# Patient Record
Sex: Female | Born: 1975
Health system: Southern US, Community
[De-identification: ages and names within clinical notes are randomized; demographics above are authoritative.]

## PROBLEM LIST (undated history)

## (undated) ENCOUNTER — Inpatient Hospital Stay (HOSPITAL_COMMUNITY): Payer: Self-pay

## (undated) DIAGNOSIS — D649 Anemia, unspecified: Secondary | ICD-10-CM

## (undated) DIAGNOSIS — IMO0001 Reserved for inherently not codable concepts without codable children: Secondary | ICD-10-CM

## (undated) DIAGNOSIS — I10 Essential (primary) hypertension: Secondary | ICD-10-CM

## (undated) DIAGNOSIS — J302 Other seasonal allergic rhinitis: Secondary | ICD-10-CM

## (undated) DIAGNOSIS — B009 Herpesviral infection, unspecified: Secondary | ICD-10-CM

## (undated) DIAGNOSIS — G709 Myoneural disorder, unspecified: Secondary | ICD-10-CM

## (undated) DIAGNOSIS — I7300 Raynaud's syndrome without gangrene: Secondary | ICD-10-CM

## (undated) DIAGNOSIS — E669 Obesity, unspecified: Secondary | ICD-10-CM

## (undated) DIAGNOSIS — F419 Anxiety disorder, unspecified: Secondary | ICD-10-CM

## (undated) DIAGNOSIS — K219 Gastro-esophageal reflux disease without esophagitis: Secondary | ICD-10-CM

## (undated) DIAGNOSIS — R51 Headache: Secondary | ICD-10-CM

## (undated) DIAGNOSIS — F32A Depression, unspecified: Secondary | ICD-10-CM

## (undated) DIAGNOSIS — O24419 Gestational diabetes mellitus in pregnancy, unspecified control: Secondary | ICD-10-CM

## (undated) DIAGNOSIS — R112 Nausea with vomiting, unspecified: Secondary | ICD-10-CM

## (undated) DIAGNOSIS — D509 Iron deficiency anemia, unspecified: Secondary | ICD-10-CM

## (undated) DIAGNOSIS — Z9889 Other specified postprocedural states: Secondary | ICD-10-CM

## (undated) HISTORY — DX: Obesity, unspecified: E66.9

## (undated) HISTORY — DX: Iron deficiency anemia, unspecified: D50.9

---

## 1997-01-22 HISTORY — PX: WISDOM TOOTH EXTRACTION: SHX21

## 2000-03-20 ENCOUNTER — Other Ambulatory Visit: Admission: RE | Admit: 2000-03-20 | Discharge: 2000-03-20 | Payer: Self-pay | Admitting: Obstetrics and Gynecology

## 2001-04-09 ENCOUNTER — Other Ambulatory Visit: Admission: RE | Admit: 2001-04-09 | Discharge: 2001-04-09 | Payer: Self-pay | Admitting: Obstetrics and Gynecology

## 2001-11-11 ENCOUNTER — Encounter: Payer: Self-pay | Admitting: Family Medicine

## 2001-11-11 ENCOUNTER — Encounter: Admission: RE | Admit: 2001-11-11 | Discharge: 2001-11-11 | Payer: Self-pay | Admitting: Family Medicine

## 2001-12-30 ENCOUNTER — Encounter: Admission: RE | Admit: 2001-12-30 | Discharge: 2001-12-30 | Payer: Self-pay | Admitting: Neurology

## 2001-12-30 ENCOUNTER — Encounter: Payer: Self-pay | Admitting: Neurology

## 2002-05-21 ENCOUNTER — Other Ambulatory Visit: Admission: RE | Admit: 2002-05-21 | Discharge: 2002-05-21 | Payer: Self-pay | Admitting: Obstetrics and Gynecology

## 2002-06-16 ENCOUNTER — Encounter: Payer: Self-pay | Admitting: Obstetrics and Gynecology

## 2002-06-16 ENCOUNTER — Encounter: Admission: RE | Admit: 2002-06-16 | Discharge: 2002-06-16 | Payer: Self-pay | Admitting: Obstetrics and Gynecology

## 2002-12-04 ENCOUNTER — Ambulatory Visit (HOSPITAL_COMMUNITY): Admission: RE | Admit: 2002-12-04 | Discharge: 2002-12-04 | Payer: Self-pay | Admitting: Family Medicine

## 2002-12-08 ENCOUNTER — Other Ambulatory Visit: Admission: RE | Admit: 2002-12-08 | Discharge: 2002-12-08 | Payer: Self-pay | Admitting: Obstetrics and Gynecology

## 2002-12-11 ENCOUNTER — Ambulatory Visit (HOSPITAL_COMMUNITY): Admission: RE | Admit: 2002-12-11 | Discharge: 2002-12-11 | Payer: Self-pay | Admitting: Family Medicine

## 2003-01-23 HISTORY — PX: LAPAROSCOPIC GASTRIC BANDING: SHX1100

## 2003-08-05 ENCOUNTER — Other Ambulatory Visit: Admission: RE | Admit: 2003-08-05 | Discharge: 2003-08-05 | Payer: Self-pay | Admitting: Obstetrics and Gynecology

## 2004-01-23 HISTORY — PX: OTHER SURGICAL HISTORY: SHX169

## 2004-10-10 ENCOUNTER — Other Ambulatory Visit: Admission: RE | Admit: 2004-10-10 | Discharge: 2004-10-10 | Payer: Self-pay | Admitting: Family Medicine

## 2004-10-10 ENCOUNTER — Ambulatory Visit: Payer: Self-pay | Admitting: Family Medicine

## 2004-10-10 LAB — CONVERTED CEMR LAB: Pap Smear: ABNORMAL

## 2004-10-23 ENCOUNTER — Ambulatory Visit: Payer: Self-pay | Admitting: Family Medicine

## 2005-01-01 ENCOUNTER — Ambulatory Visit: Payer: Self-pay | Admitting: Family Medicine

## 2005-01-22 DIAGNOSIS — B009 Herpesviral infection, unspecified: Secondary | ICD-10-CM

## 2005-01-22 HISTORY — DX: Herpesviral infection, unspecified: B00.9

## 2005-01-24 ENCOUNTER — Ambulatory Visit: Payer: Self-pay | Admitting: Family Medicine

## 2005-01-25 ENCOUNTER — Ambulatory Visit: Payer: Self-pay | Admitting: Family Medicine

## 2005-05-15 ENCOUNTER — Ambulatory Visit: Payer: Self-pay | Admitting: Family Medicine

## 2005-09-28 ENCOUNTER — Ambulatory Visit: Payer: Self-pay | Admitting: Family Medicine

## 2005-10-30 DIAGNOSIS — J309 Allergic rhinitis, unspecified: Secondary | ICD-10-CM | POA: Insufficient documentation

## 2005-10-30 DIAGNOSIS — N926 Irregular menstruation, unspecified: Secondary | ICD-10-CM | POA: Insufficient documentation

## 2005-10-30 DIAGNOSIS — I10 Essential (primary) hypertension: Secondary | ICD-10-CM | POA: Insufficient documentation

## 2005-11-07 ENCOUNTER — Ambulatory Visit: Payer: Self-pay | Admitting: Family Medicine

## 2005-12-03 ENCOUNTER — Encounter: Payer: Self-pay | Admitting: Family Medicine

## 2005-12-03 DIAGNOSIS — E669 Obesity, unspecified: Secondary | ICD-10-CM | POA: Insufficient documentation

## 2005-12-06 ENCOUNTER — Ambulatory Visit: Payer: Self-pay | Admitting: Family Medicine

## 2005-12-12 ENCOUNTER — Telehealth: Payer: Self-pay | Admitting: Family Medicine

## 2006-12-09 ENCOUNTER — Ambulatory Visit: Payer: Self-pay | Admitting: Family Medicine

## 2007-10-17 ENCOUNTER — Ambulatory Visit: Payer: Self-pay | Admitting: Occupational Medicine

## 2007-10-20 ENCOUNTER — Encounter: Payer: Self-pay | Admitting: Family Medicine

## 2007-10-24 ENCOUNTER — Ambulatory Visit: Payer: Self-pay | Admitting: Occupational Medicine

## 2007-10-24 LAB — CONVERTED CEMR LAB: Beta hcg, urine, semiquantitative: POSITIVE

## 2007-11-14 ENCOUNTER — Ambulatory Visit: Payer: Self-pay | Admitting: Family Medicine

## 2007-11-19 ENCOUNTER — Ambulatory Visit (HOSPITAL_COMMUNITY): Admission: RE | Admit: 2007-11-19 | Discharge: 2007-11-19 | Payer: Self-pay | Admitting: Obstetrics and Gynecology

## 2007-11-19 IMAGING — US US OB COMP LESS 14 WK
1 series · 14 of 28 positions shown · non-contrast
Comparison: none

OBSTETRICAL ULTRASOUND:
 This ultrasound exam was performed in the [HOSPITAL] Ultrasound Department.  The OB US report was generated in the AS system, and faxed to the ordering physician.  This report is also available in [REDACTED] PACS.

[Series 1: us ob comp less 14 wk · 0.26mm/px · 14 of 35 slices shown]
[im 2/35]
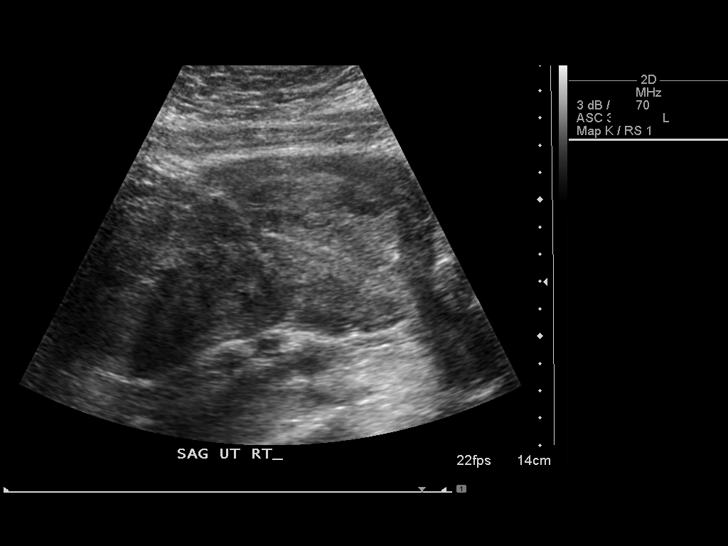
[im 4/35]
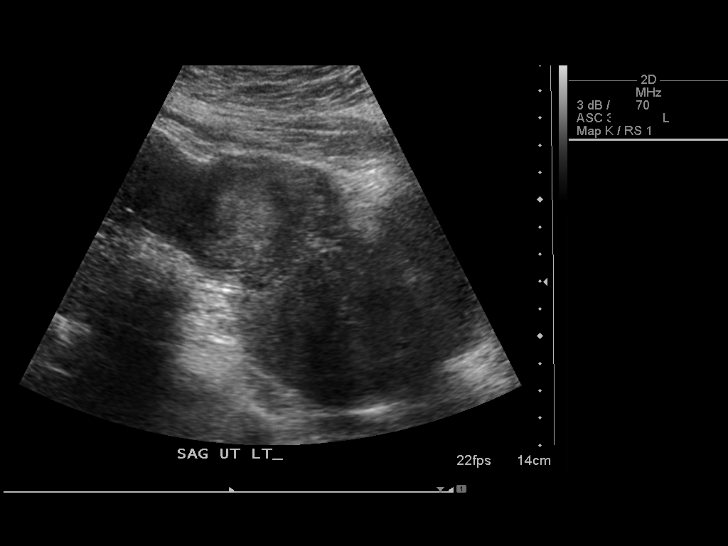
[im 7/35]
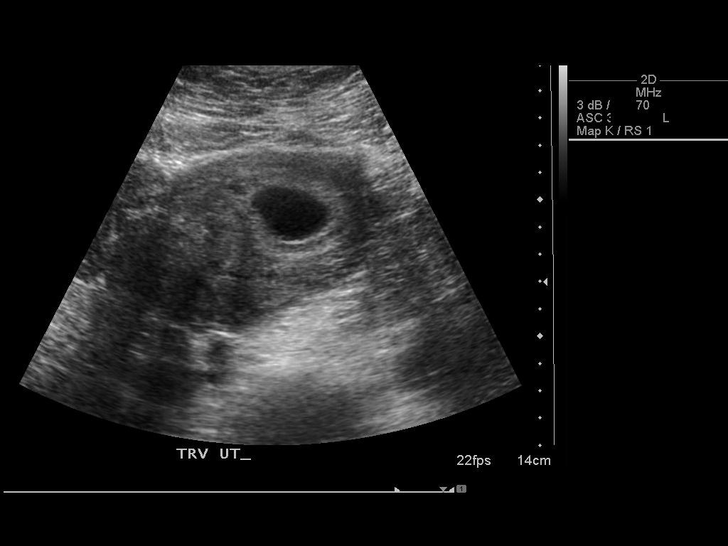
[im 9/35]
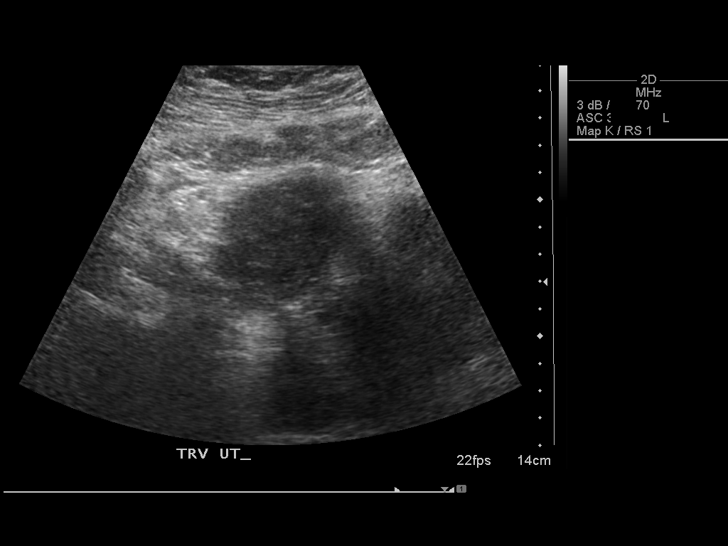
[im 12/35]
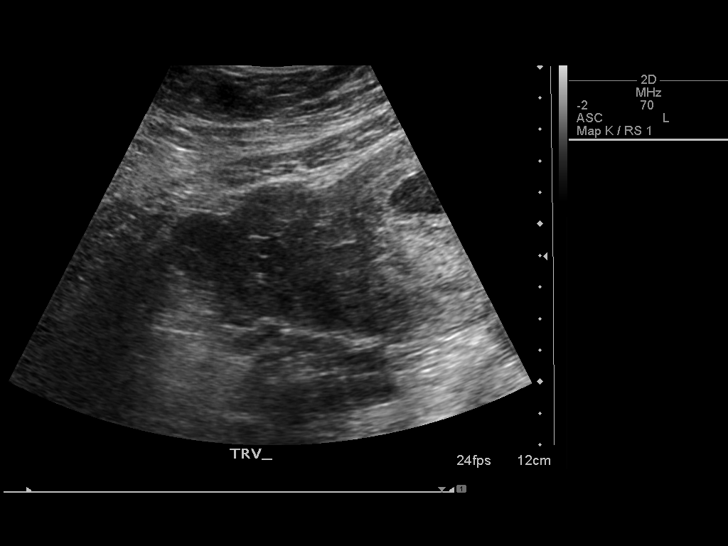
[im 14/35]
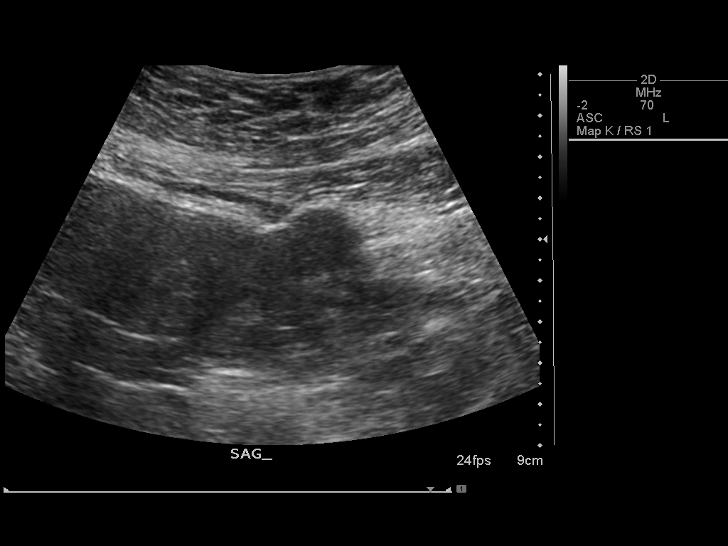
[im 17/35]
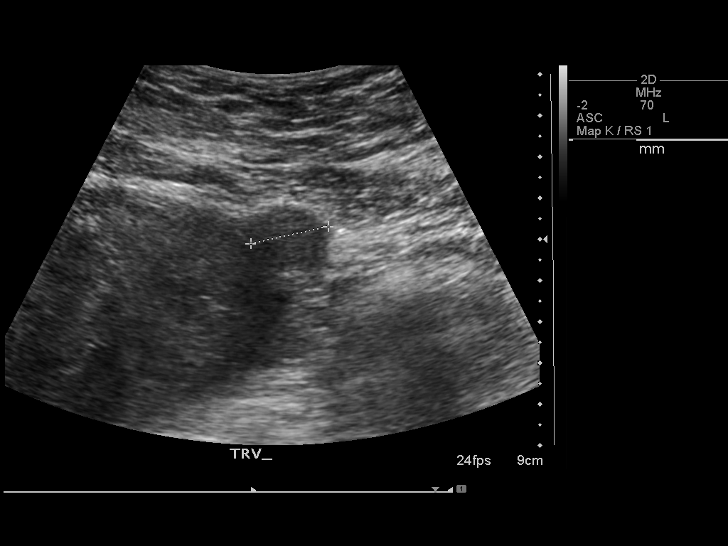
[im 19/35]
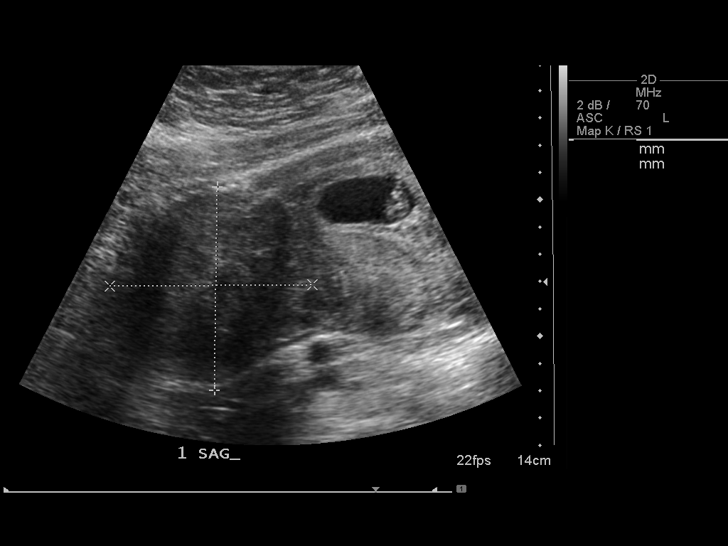
[im 22/35]
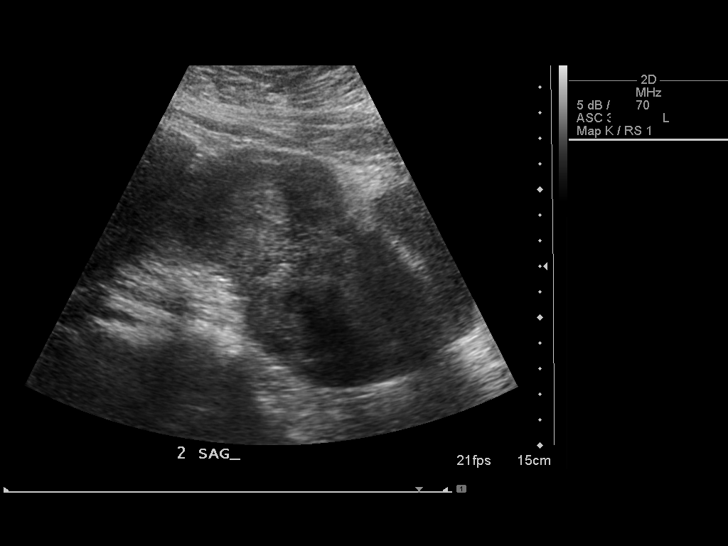
[im 24/35]
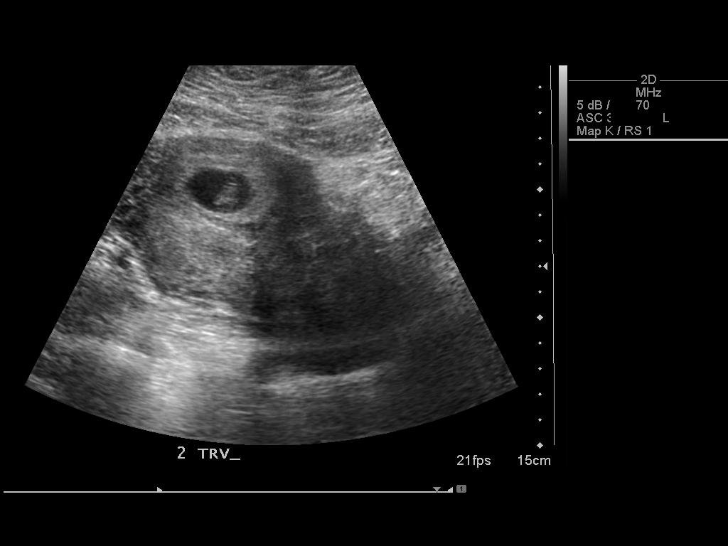
[im 27/35]
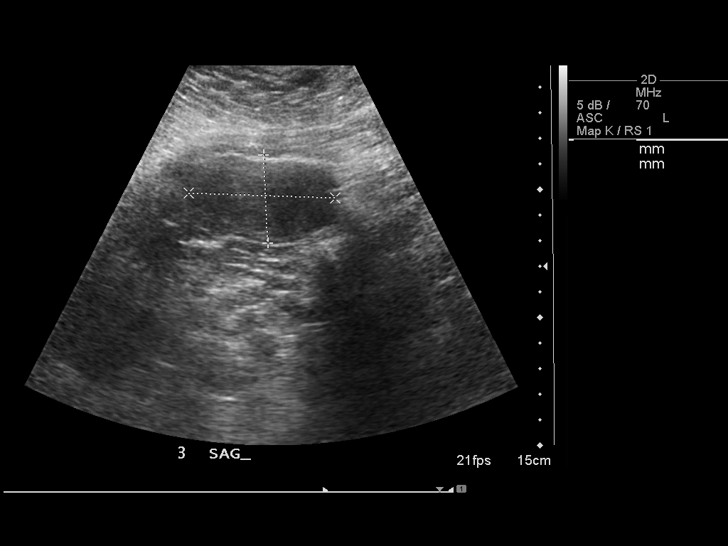
[im 29/35]
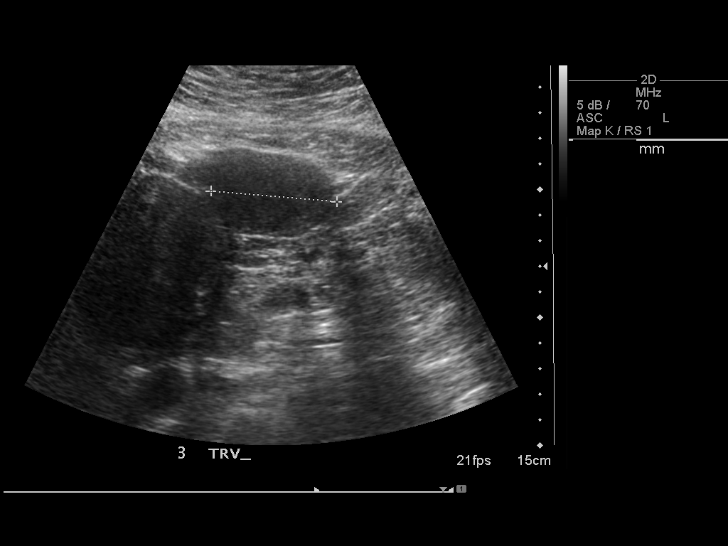
[im 32/35]
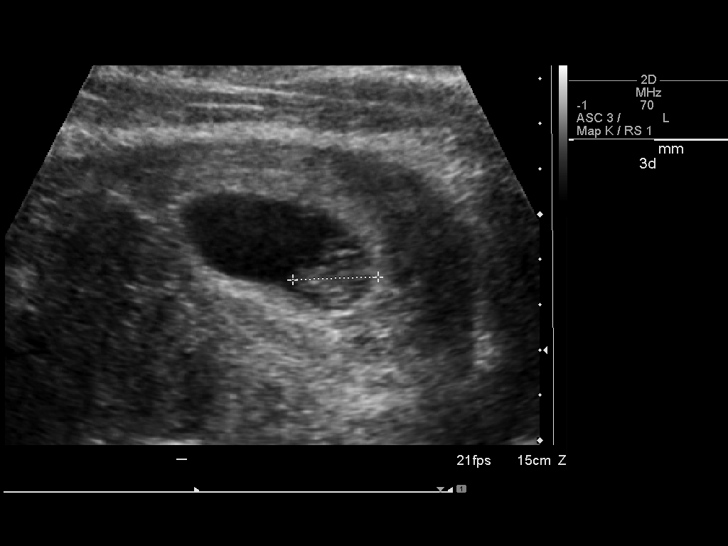
[im 35/35]
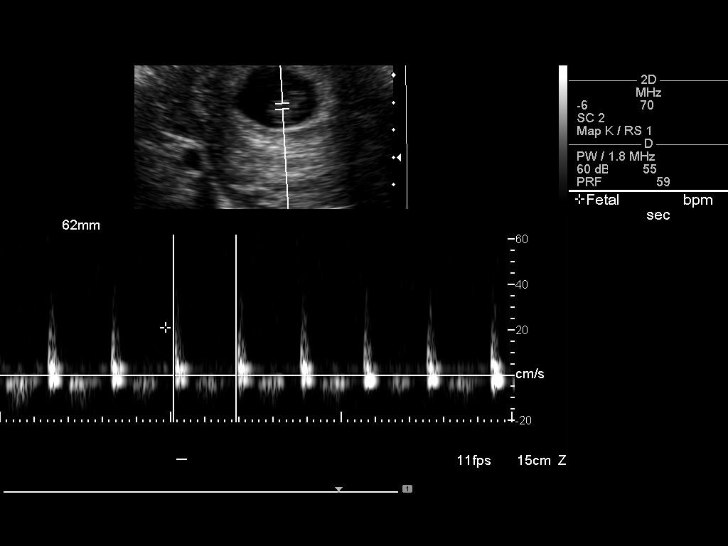

[14 of 28 positions shown; findings below may reference images not displayed]

IMPRESSION: See AS Obstetric US report.

## 2008-01-23 DIAGNOSIS — IMO0001 Reserved for inherently not codable concepts without codable children: Secondary | ICD-10-CM

## 2008-01-23 HISTORY — DX: Reserved for inherently not codable concepts without codable children: IMO0001

## 2008-03-11 ENCOUNTER — Ambulatory Visit (HOSPITAL_COMMUNITY): Admission: RE | Admit: 2008-03-11 | Discharge: 2008-03-11 | Payer: Self-pay | Admitting: Obstetrics and Gynecology

## 2008-03-11 IMAGING — US US FETAL BPP W/O NONSTRESS
1 of 2 series · 14 of 28 positions shown · non-contrast
Comparison: none

OBSTETRICAL ULTRASOUND:
 This ultrasound exam was performed in the [HOSPITAL] Ultrasound Department.  The OB US report was generated in the AS system, and faxed to the ordering physician.  This report is also available in [REDACTED] PACS.

[Series 1: us fetal bpp w/o ns modify · non-contrast · 14 of 87 slices shown]
[im 1/87]
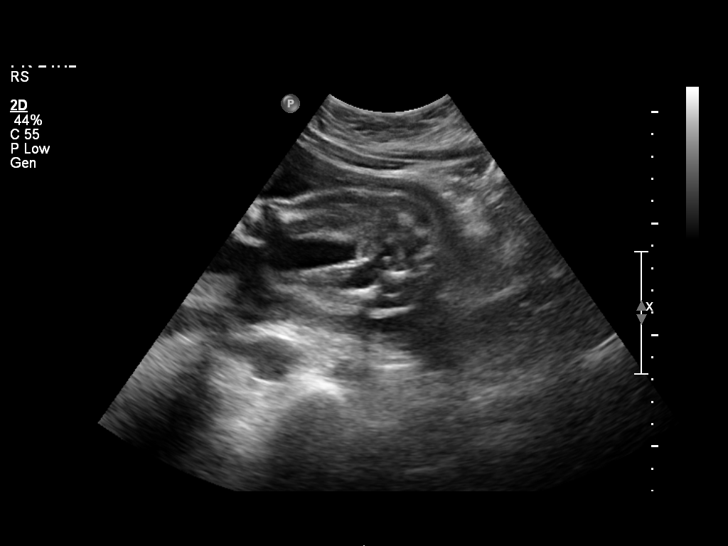
[im 7/87]
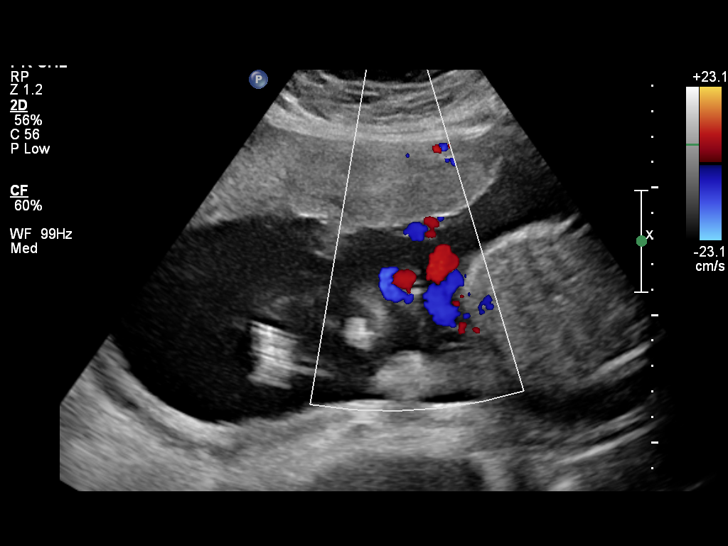
[im 14/87]
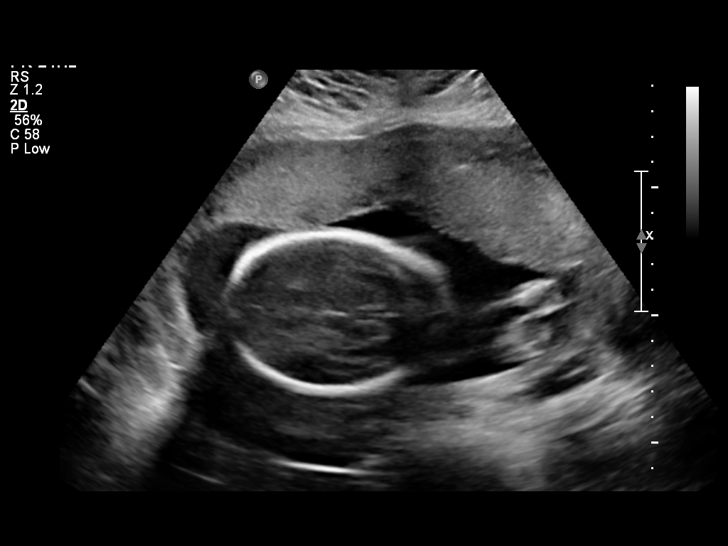
[im 20/87]
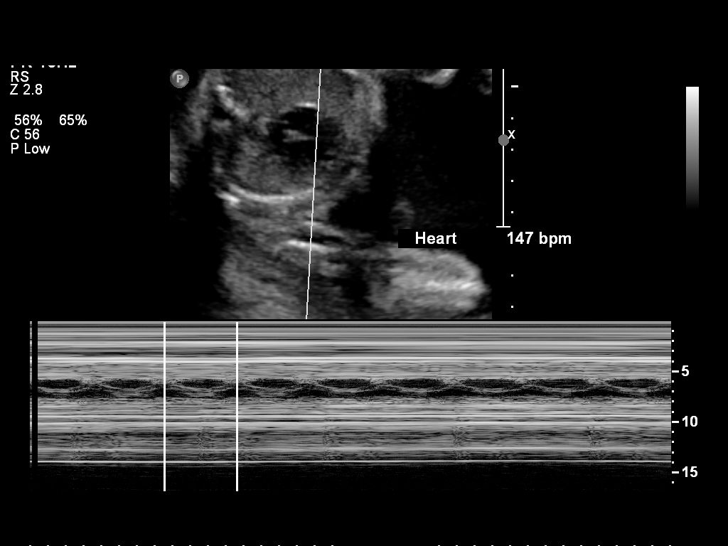
[im 27/87]
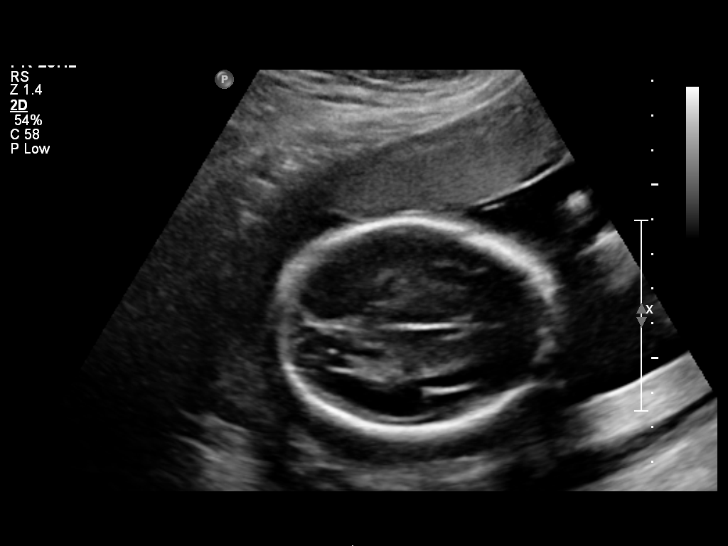
[im 34/87]
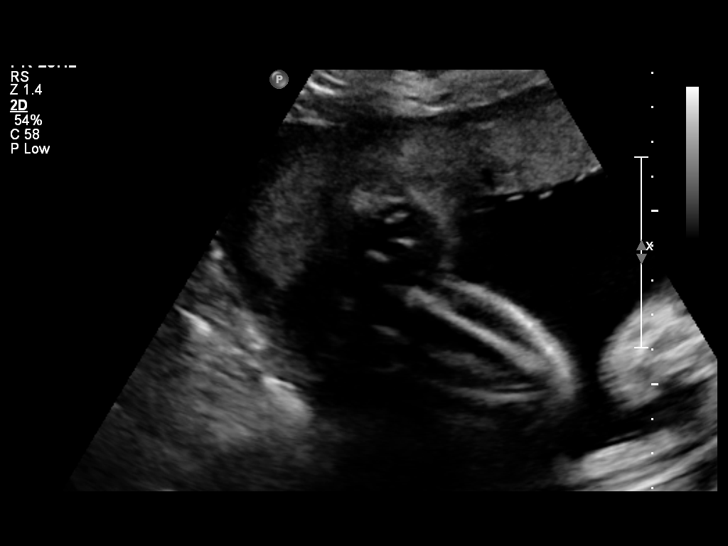
[im 40/87]
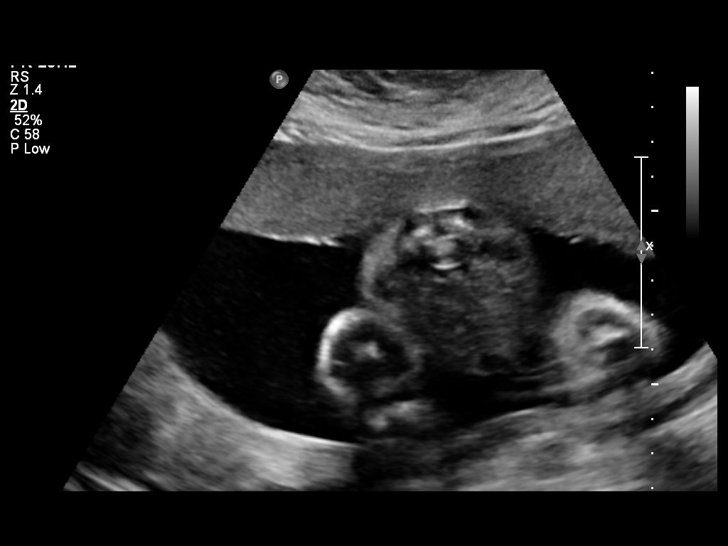
[im 47/87]
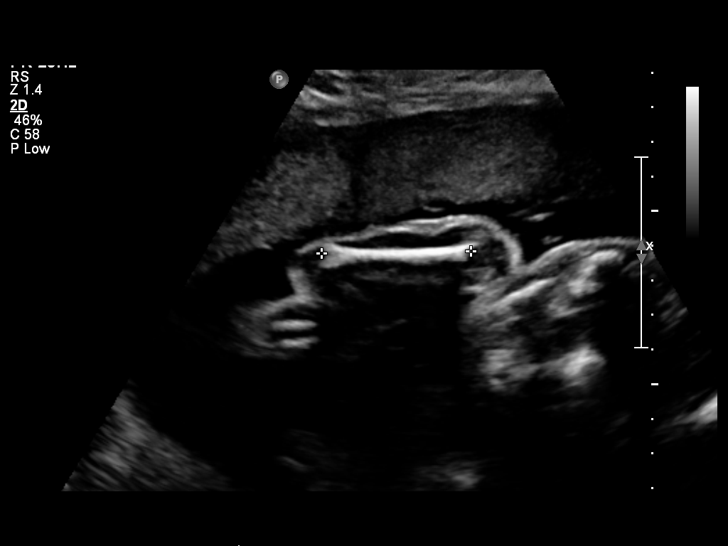
[im 53/87]
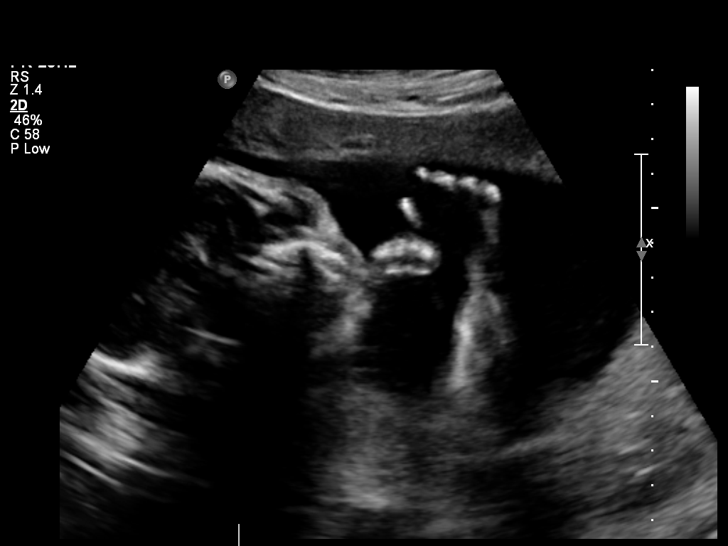
[im 60/87]
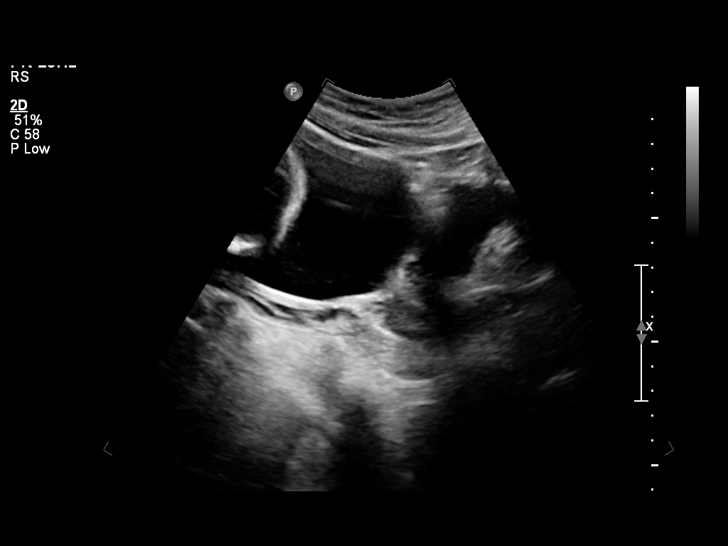
[im 67/87]
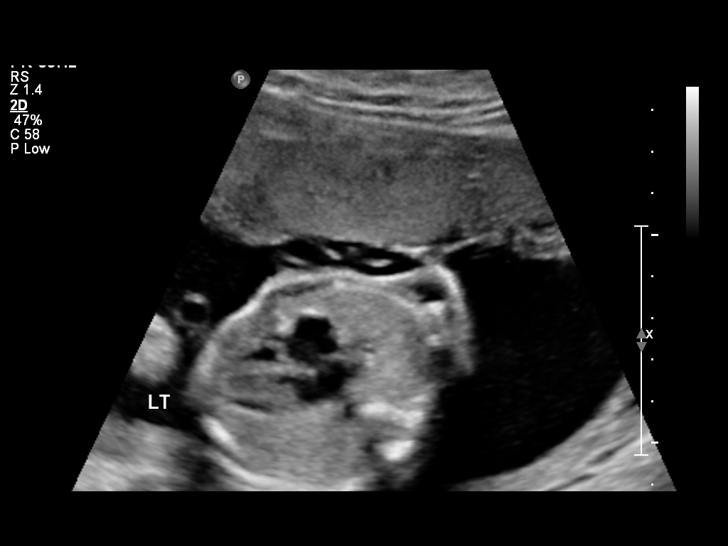
[im 73/87]
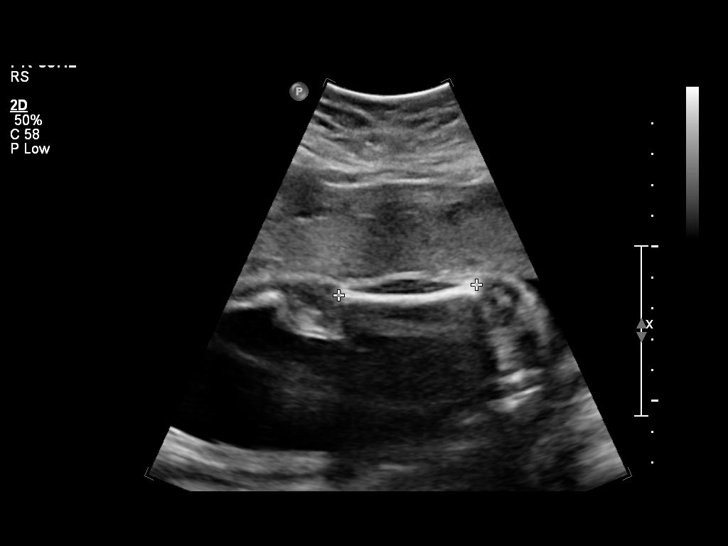
[im 80/87]
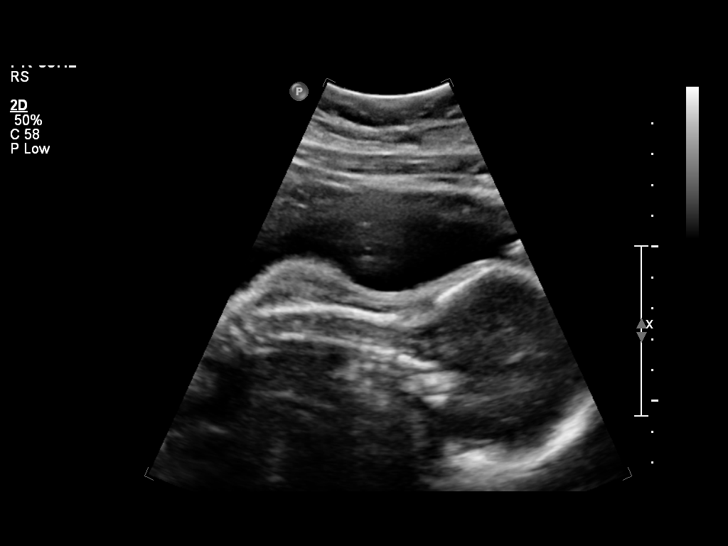
[im 87/87]
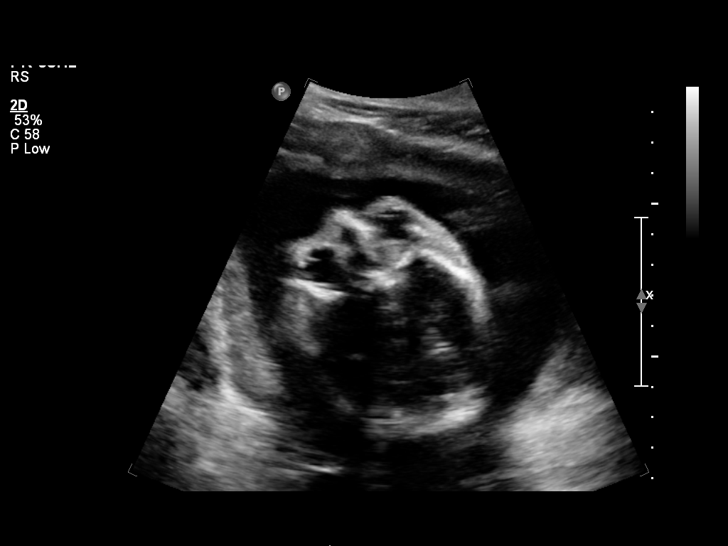

[14 of 28 positions shown; findings below may reference images not displayed]

IMPRESSION: See AS Obstetric US report.

## 2008-06-13 ENCOUNTER — Inpatient Hospital Stay (HOSPITAL_COMMUNITY): Admission: AD | Admit: 2008-06-13 | Discharge: 2008-06-14 | Payer: Self-pay | Admitting: Obstetrics and Gynecology

## 2008-06-15 ENCOUNTER — Inpatient Hospital Stay (HOSPITAL_COMMUNITY): Admission: AD | Admit: 2008-06-15 | Discharge: 2008-06-17 | Payer: Self-pay | Admitting: Obstetrics and Gynecology

## 2008-06-22 ENCOUNTER — Ambulatory Visit: Payer: Self-pay | Admitting: Family Medicine

## 2008-06-22 LAB — CONVERTED CEMR LAB
Bilirubin Urine: NEGATIVE
Glucose, Urine, Semiquant: NEGATIVE
Ketones, urine, test strip: NEGATIVE
Nitrite: NEGATIVE
Protein, U semiquant: 30
Specific Gravity, Urine: 1.01
Urobilinogen, UA: 0.2
pH: 6.5

## 2008-07-27 ENCOUNTER — Telehealth: Payer: Self-pay | Admitting: Family Medicine

## 2008-10-22 ENCOUNTER — Encounter: Payer: Self-pay | Admitting: Family Medicine

## 2008-10-25 ENCOUNTER — Encounter: Payer: Self-pay | Admitting: Family Medicine

## 2008-12-09 ENCOUNTER — Telehealth: Payer: Self-pay | Admitting: Family Medicine

## 2008-12-10 ENCOUNTER — Ambulatory Visit: Payer: Self-pay | Admitting: Family Medicine

## 2008-12-27 ENCOUNTER — Ambulatory Visit: Payer: Self-pay | Admitting: Family Medicine

## 2008-12-27 DIAGNOSIS — K219 Gastro-esophageal reflux disease without esophagitis: Secondary | ICD-10-CM | POA: Insufficient documentation

## 2008-12-27 DIAGNOSIS — L0390 Cellulitis, unspecified: Secondary | ICD-10-CM

## 2008-12-27 DIAGNOSIS — L0291 Cutaneous abscess, unspecified: Secondary | ICD-10-CM | POA: Insufficient documentation

## 2009-05-26 ENCOUNTER — Ambulatory Visit: Payer: Self-pay | Admitting: Family Medicine

## 2009-05-26 LAB — CONVERTED CEMR LAB: Rapid Strep: NEGATIVE

## 2009-05-31 ENCOUNTER — Telehealth: Payer: Self-pay | Admitting: Family Medicine

## 2009-06-01 ENCOUNTER — Telehealth: Payer: Self-pay | Admitting: Family Medicine

## 2009-12-02 ENCOUNTER — Ambulatory Visit: Payer: Self-pay | Admitting: Family Medicine

## 2010-01-05 ENCOUNTER — Ambulatory Visit: Payer: Self-pay | Admitting: Family Medicine

## 2010-01-05 DIAGNOSIS — J329 Chronic sinusitis, unspecified: Secondary | ICD-10-CM | POA: Insufficient documentation

## 2010-01-06 ENCOUNTER — Encounter: Payer: Self-pay | Admitting: Family Medicine

## 2010-02-01 ENCOUNTER — Inpatient Hospital Stay (HOSPITAL_COMMUNITY)
Admission: AD | Admit: 2010-02-01 | Discharge: 2010-02-01 | Payer: Self-pay | Source: Home / Self Care | Attending: Obstetrics and Gynecology | Admitting: Obstetrics and Gynecology

## 2010-02-06 LAB — URINE MICROSCOPIC-ADD ON

## 2010-02-06 LAB — URINALYSIS, ROUTINE W REFLEX MICROSCOPIC
Bilirubin Urine: NEGATIVE
Hgb urine dipstick: NEGATIVE
Ketones, ur: NEGATIVE mg/dL
Nitrite: NEGATIVE
Protein, ur: NEGATIVE mg/dL
Specific Gravity, Urine: 1.025 (ref 1.005–1.030)
Urine Glucose, Fasting: NEGATIVE mg/dL
Urobilinogen, UA: 0.2 mg/dL (ref 0.0–1.0)
pH: 6 (ref 5.0–8.0)

## 2010-02-12 ENCOUNTER — Encounter: Payer: Self-pay | Admitting: Obstetrics and Gynecology

## 2010-02-21 ENCOUNTER — Inpatient Hospital Stay (HOSPITAL_COMMUNITY)
Admission: AD | Admit: 2010-02-21 | Discharge: 2010-02-21 | Payer: Self-pay | Source: Home / Self Care | Attending: Obstetrics and Gynecology | Admitting: Obstetrics and Gynecology

## 2010-02-21 DIAGNOSIS — O30009 Twin pregnancy, unspecified number of placenta and unspecified number of amniotic sacs, unspecified trimester: Secondary | ICD-10-CM

## 2010-02-21 DIAGNOSIS — O479 False labor, unspecified: Secondary | ICD-10-CM

## 2010-02-21 DIAGNOSIS — R112 Nausea with vomiting, unspecified: Secondary | ICD-10-CM

## 2010-02-21 LAB — URINALYSIS, ROUTINE W REFLEX MICROSCOPIC
Bilirubin Urine: NEGATIVE
Hgb urine dipstick: NEGATIVE
Ketones, ur: 15 mg/dL — AB
Nitrite: NEGATIVE
Protein, ur: NEGATIVE mg/dL
Specific Gravity, Urine: 1.025 (ref 1.005–1.030)
Urine Glucose, Fasting: NEGATIVE mg/dL
Urobilinogen, UA: 0.2 mg/dL (ref 0.0–1.0)
pH: 5.5 (ref 5.0–8.0)

## 2010-02-21 LAB — URINE MICROSCOPIC-ADD ON

## 2010-02-21 NOTE — Progress Notes (Signed)
Summary: Rx for Protonix  Phone Note Call from Patient   Caller: Patient Call For: Nani Gasser MD Summary of Call: Protonix working well for her. Can you call in rx to [facility] in [place] Initial call taken by: Kathlene November,  Jun 01, 2009 8:29 AM  Follow-up for Phone Call        Rx sent.  Follow-up by: Nani Gasser MD,  Jun 01, 2009 8:49 AM    New/Updated Medications: PROTONIX 40 MG TBEC (PANTOPRAZOLE SODIUM) Take 1 tablet by mouth once a day Prescriptions: PROTONIX 40 MG TBEC (PANTOPRAZOLE SODIUM) Take 1 tablet by mouth once a day  #30 x 2   Entered and Authorized by:   Nani Gasser MD   Signed by:   Nani Gasser MD on 06/01/2009   Method used:   Electronically to        [facility] [address] (retail)       [address]       [place], [place]  [address]       Ph: [phone]       Fax: [phone]   RxID:   [id number]

## 2010-02-21 NOTE — Progress Notes (Signed)
Summary: Eyes red and swollen  Phone Note Call from Patient Call back at [phone]   Caller: Patient Call For: Nani Gasser MD Summary of Call: pt calls and states that her eye is still swollen and red and some drainage- has moved into other eye as well. did see some improvement with eye ointment but cam e back Initial call taken by: Kathlene November,  May 31, 2009 9:14 AM  Follow-up for Phone Call        Lets refer to ophth for further evaluation since didn't respond well to the ABX ointment.  Follow-up by: Nani Gasser MD,  May 31, 2009 9:46 AM

## 2010-02-21 NOTE — Assessment & Plan Note (Signed)
Summary: AR, abscess   Vital Signs:  Patient profile:   35 year old female Height:      66 inches Weight:      235 pounds BMI:     38.07 Temp:     97.8 degrees F oral Pulse rate:   82 / minute BP sitting:   133 / 82  (right arm) Cuff size:   large  Vitals Entered By: Avon Gully CMA, Duncan Dull) (December 02, 2009 9:20 AM) CC: sore throat x 3-4 days,sneezing,congestion,eyes itching,coughing   Primary Care Provider:  Linford Arnold, C  CC:  sore throat x 3-4 days, sneezing, congestion, eyes itching, and coughing.  History of Present Illness: sore throat x 3-4 days,sneezing,congestion,eyes itching,coughing.  No meds.   Thout initally it was allergies but feels it has lasted since September.  NO fever.  Facial pressure    She is pregnant, she is having twins. She is [redacted] weeks along.   Current Medications (verified): 1)  None  Allergies (verified): No Known Drug Allergies  Comments:  Nurse/Medical Assistant: The patient's medications and allergies were reviewed with the patient and were updated in the Medication and Allergy Lists. Avon Gully CMA, Duncan Dull) (December 02, 2009 9:21 AM)  Physical Exam  General:  Well-developed,well-nourished,in no acute distress; alert,appropriate and cooperative throughout examination Head:  Normocephalic and atraumatic without obvious abnormalities. No apparent alopecia or balding. Eyes:  No corneal or conjunctival inflammation noted. EOMI. Perrla Ears:  External ear exam shows no significant lesions or deformities.  Otoscopic examination reveals clear canals, tympanic membranes are intact bilaterally without bulging, retraction, inflammation or discharge. Hearing is grossly normal bilaterally. Nose:  External nasal examination shows no deformity or inflammation. Nasal mucosa are pink and moist without lesions or exudates. Turbinates are mildy swollen.  Mouth:  Oral mucosa and oropharynx without lesions or exudates.  Teeth in good repair. Neck:   No deformities, masses, or tenderness noted. Lungs:  Normal respiratory effort, chest expands symmetrically. Lungs are clear to auscultation, no crackles or wheezes. Heart:  Normal rate and regular rhythm. S1 and S2 normal without gallop, murmur, click, rub or other extra sounds. Skin:  Under her left breast in teh 7 o'clock position she has a 2cm erythematou nodule. No drainage. It is tender. No sign of cellulitits.   Cervical Nodes:  No lymphadenopathy noted Psych:  Cognition and judgment appear intact. Alert and cooperative with normal attention span and concentration. No apparent delusions, illusions, hallucinations   Impression & Recommendations:  Problem # 1:  RHINITIS, ALLERGIC (ICD-477.9) Trial of Claritin for one week. Call if not better.   Problem # 2:  ABSCESS (ICD-682.9)  Will treat with keflex. This will not cover for MRSA so if not iproving may need to change ABX or may need I & D.  Recommend warm compresses.   Her updated medication list for this problem includes:    Cephalexin 500 Mg Tabs (Cephalexin) .Marland Kitchen... Take 1 tablet by mouth three times a day for 10 days  Complete Medication List: 1)  Cephalexin 500 Mg Tabs (Cephalexin) .... Take 1 tablet by mouth three times a day for 10 days  Patient Instructions: 1)  Trial of Claritin for one week. Call if not better.  2)  Recommend warm compresses to the area on her breast.  Start the keflex. Call if not getting better. May need to be drained if getting worse.  Prescriptions: CEPHALEXIN 500 MG TABS (CEPHALEXIN) Take 1 tablet by mouth three times a day for 10 days  #  30 x 0   Entered and Authorized by:   Nani Gasser MD   Signed by:   Nani Gasser MD on 12/02/2009   Method used:   Electronically to        [facility] (retail)       [address]       [place], [place]  [address]       Ph: [phone]       Fax: [phone]   RxID:   [id number]    Orders Added: 1)   Est. Patient Level IV [84132]

## 2010-02-21 NOTE — Assessment & Plan Note (Signed)
Summary: Conjunctivitis, URI   Vital Signs:  Patient profile:   35 year old female Height:      66 inches Weight:      226 pounds Temp:     99.9 degrees F oral Pulse rate:   91 / minute BP sitting:   133 / 82  (left arm) Cuff size:   large  Vitals Entered By: Kathlene November (May 26, 2009 9:58 AM) CC: sore throat, left eye red, painful and draining, vomited during night last night   Primary Care Provider:  Linford Arnold, C  CC:  sore throat, left eye red, painful and draining, and vomited during night last night.  History of Present Illness: sore throat, left eye red, painful and draining, vomited during night last night. Had chills yesterday.  Left eye red adn painful.  This morning had some yellow drainage.  Today mild ST.  Vomiting for past 1.5 weeks at night.  No ear pain. No OTC meds.  Vision is OK. No abdominal pain. The vomit happens in the middle of the night. Says will wake up, then cough and then will vomit whatever she last ate.  Thought his of previous vomiting and GI problems since her lap band.    Current Medications (verified): 1)  Ranitidine Hcl 150 Mg Caps (Ranitidine Hcl) .... Take 1 Tablet By Mouth Two Times A Day  Allergies (verified): No Known Drug Allergies  Comments:  Nurse/Medical Assistant: The patient's medications and allergies were reviewed with the patient and were updated in the Medication and Allergy Lists. Kathlene November (May 26, 2009 9:59 AM)  Physical Exam  General:  Well-developed,well-nourished,in no acute distress; alert,appropriate and cooperative throughout examination Head:  Normocephalic and atraumatic without obvious abnormalities. No apparent alopecia or balding. Eyes:  EOM intact.  Sclera is injected on the left eye.  No discharge or cursting. bottom lid is midly swollen.  Ears:  External ear exam shows no significant lesions or deformities.  Otoscopic examination reveals clear canals, tympanic membranes are intact bilaterally without bulging,  retraction, inflammation or discharge. Hearing is grossly normal bilaterally. Nose:  External nasal examination shows no deformity or inflammation. Nasal mucosa are pink and moist without lesions or exudates. Mouth:  OP with mild swelling of tonsils. NO erythema.  Neck:  No deformities, masses, or tenderness noted. Lungs:  Normal respiratory effort, chest expands symmetrically. Lungs are clear to auscultation, no crackles or wheezes. Heart:  Normal rate and regular rhythm. S1 and S2 normal without gallop, murmur, click, rub or other extra sounds. Skin:  no rashes.   Cervical Nodes:  No lymphadenopathy noted Psych:  Cognition and judgment appear intact. Alert and cooperative with normal attention span and concentration. No apparent delusions, illusions, hallucinations   Impression & Recommendations:  Problem # 1:  CONJUNCTIVITIS (ICD-372.30)  Her updated medication list for this problem includes:    Erythromycin 5 Mg/gm Oint (Erythromycin) .Marland Kitchen... 1/2 inch ribbon to affected eye for 4 x a day for one week.  Discussed treatment, and urged patient to wash hands carefully after touching face.   Problem # 2:  URI (ICD-465.9)  I really don't think the vomiting is related to the current illness since it start 1.5 before current sxs and she has had prlboms with vomiting intermittantly in the past with s/p banding. Trial of PPI 30 min before evening meal. IF not helping after 5 days please let us know.   Orders: Rapid Strep ([address])  Complete Medication List: 1)  Ranitidine Hcl 150  Mg Caps (Ranitidine hcl) .... Take 1 tablet by mouth two times a day 2)  Erythromycin 5 Mg/gm Oint (Erythromycin) .... 1/2 inch ribbon to affected eye for 4 x a day for one week.  Patient Instructions: 1)   Trial of Dexilant 30 min before evening meal. IF not helping after 5 days please let us know.  2)  Antibiotic eye drops [facility] to [facility].  Prescriptions: ERYTHROMYCIN 5 MG/GM OINT (ERYTHROMYCIN) 1/2 inch ribbon to  affected eye for 4 x a day for one week.  #1 tube x 0   Entered and Authorized by:   Nani Gasser MD   Signed by:   Nani Gasser MD on 05/26/2009   Method used:   Electronically to        [facility] [address] (retail)       [address] [place], [place]  [address]       Ph: [phone]       Fax: [phone]   RxID:   [id number]   Laboratory Results  Date/Time Received: 05/26/2009 Date/Time Reported: 05/26/2009  Other Tests  Rapid Strep: negative

## 2010-02-23 LAB — URINE CULTURE
Colony Count: 50000
Culture  Setup Time: 201202011513

## 2010-02-23 NOTE — Assessment & Plan Note (Signed)
Summary: Boil under her Rt. arm-jr   Vital Signs:  Patient profile:   35 year old female Height:      66 inches Weight:      241 pounds Pulse rate:   95 / minute BP sitting:   121 / 75  (right arm) Cuff size:   large  Vitals Entered By: Avon Gully CMA, Duncan Dull) (January 05, 2010 11:16 AM) CC: boil under rt arm x 3 weeks   Primary Care Provider:  Linford Arnold, C  CC:  boil under rt arm x 3 weeks.  History of Present Illness: boil under rt arm x 3 weeks,Initially had a boil underneath each arm and thought it was caused by changing deodorants. She says the one on the left resolved on tis own very quickly adn the one ont he right has been slowly gettting larger. Has been using warm compresses. No fever  Says still having nasa congestion, HA and pressure. At the last OV about 3 weeks ago I felt it was more allergies. She has tried the anithistamine with no relief. No fever or GI sxs. She is pregnant with twins.   Current Medications (verified): 1)  None  Allergies (verified): No Known Drug Allergies  Comments:  Nurse/Medical Assistant: The patient's medications and allergies were reviewed with the patient and were updated in the Medication and Allergy Lists. Avon Gully CMA, Duncan Dull) (January 05, 2010 11:17 AM)  Physical Exam  General:  Well-developed,well-nourished,in no acute distress; alert,appropriate and cooperative throughout examination Head:  Normocephalic and atraumatic without obvious abnormalities. No apparent alopecia or balding. Skin:  Right axilla with 3 x 4 cm abscess.  Erythema just on top of the lesion. No streaking or sign of cellulitis. No active drainage.    Impression & Recommendations:  Problem # 1:  ABSCESS (ICD-682.9) Abscess drained. Pt tolerated well. Wound culture sent. Wound care reviewed. Call if any concerns or not healing well.  The following medications were removed from the medication list:    Cephalexin 500 Mg Tabs (Cephalexin) .Marland Kitchen...  Take 1 tablet by mouth three times a day for 10 days Her updated medication list for this problem includes:    Amoxicillin 500 Mg Caps (Amoxicillin) .Marland Kitchen... Take 1 tablet by mouth three times a day for 10 days  Orders: T-Culture,Abcess (87070/[id number]) I&D Abscess, Complex (10061)  Problem # 2:  SINUSITIS (ICD-473.9) Will tx with amox. If she is not better then consider discussing with her OB. Explained there is a condition of pregnancy where you have chronic nasal congestion throught the pregnancy.  The following medications were removed from the medicatioWill n list:    Cephalexin 500 Mg Tabs (Cephalexin) .Marland Kitchen... Take 1 tablet by mouth three times a day for 10 days Her updated medication list for this problem includes:    Amoxicillin 500 Mg Caps (Amoxicillin) .Marland Kitchen... Take 1 tablet by mouth three times a day for 10 days  Complete Medication List: 1)  Amoxicillin 500 Mg Caps (Amoxicillin) .... Take 1 tablet by mouth three times a day for 10 days Prescriptions: AMOXICILLIN 500 MG CAPS (AMOXICILLIN) Take 1 tablet by mouth three times a day for 10 days  #30 x 0   Entered and Authorized by:   Nani Gasser MD   Signed by:   Nani Gasser MD on 01/05/2010   Method used:   Electronically to        [facility] [address] [address] (retail)       [address]  [place], [place]  [address]       Ph: [phone]       Fax: [phone]   RxID:   [id number]    Orders Added: 1)  T-Culture,Abcess [87070/[id number]] 2)  Est. Patient Level IV [82423] 3)  I&D Abscess, Complex [10061]    Procedure Note  Incision & Drainage: Indication: infected lesion  Procedure # 1: I & D with packing    Size (in cm): 3.0 x 4.0    Region: Right axilla    Comment: The wound was probed to loosen any adhesions to to make sure it is not tracking.     Instrument used: #10 blade    Anesthesia: 1% lidocaine w/epinephrine  Cleaned and prepped with: alcohol and  betadine Wound dressing: bulky gauze dressing

## 2010-03-08 ENCOUNTER — Other Ambulatory Visit: Payer: Self-pay | Admitting: Obstetrics and Gynecology

## 2010-03-08 ENCOUNTER — Inpatient Hospital Stay (HOSPITAL_COMMUNITY)
Admission: AD | Admit: 2010-03-08 | Discharge: 2010-03-12 | DRG: 765 | Disposition: A | Discharge status: Home / Self Care | Payer: 59 | Source: Ambulatory Visit | Attending: Obstetrics and Gynecology | Admitting: Obstetrics and Gynecology

## 2010-03-08 DIAGNOSIS — O322XX0 Maternal care for transverse and oblique lie, not applicable or unspecified: Secondary | ICD-10-CM | POA: Diagnosis present

## 2010-03-08 DIAGNOSIS — O34599 Maternal care for other abnormalities of gravid uterus, unspecified trimester: Secondary | ICD-10-CM | POA: Diagnosis present

## 2010-03-08 DIAGNOSIS — D4959 Neoplasm of unspecified behavior of other genitourinary organ: Secondary | ICD-10-CM | POA: Diagnosis present

## 2010-03-08 DIAGNOSIS — O9902 Anemia complicating childbirth: Secondary | ICD-10-CM | POA: Diagnosis present

## 2010-03-08 DIAGNOSIS — D259 Leiomyoma of uterus, unspecified: Secondary | ICD-10-CM | POA: Diagnosis present

## 2010-03-08 DIAGNOSIS — O30009 Twin pregnancy, unspecified number of placenta and unspecified number of amniotic sacs, unspecified trimester: Principal | ICD-10-CM | POA: Diagnosis present

## 2010-03-08 DIAGNOSIS — D649 Anemia, unspecified: Secondary | ICD-10-CM | POA: Diagnosis present

## 2010-03-08 LAB — URINALYSIS, ROUTINE W REFLEX MICROSCOPIC
Bilirubin Urine: NEGATIVE
Ketones, ur: 15 mg/dL — AB
Nitrite: NEGATIVE
Protein, ur: NEGATIVE mg/dL
Specific Gravity, Urine: 1.02 (ref 1.005–1.030)
Urine Glucose, Fasting: NEGATIVE mg/dL
Urobilinogen, UA: 1 mg/dL (ref 0.0–1.0)
pH: 6.5 (ref 5.0–8.0)

## 2010-03-08 LAB — ABO/RH: ABO/RH(D): O POS

## 2010-03-08 LAB — CBC
HCT: 25.3 % — ABNORMAL LOW (ref 36.0–46.0)
Hemoglobin: 7.8 g/dL — ABNORMAL LOW (ref 12.0–15.0)
MCH: 19.7 pg — ABNORMAL LOW (ref 26.0–34.0)
MCHC: 30.8 g/dL (ref 30.0–36.0)
MCV: 63.9 fL — ABNORMAL LOW (ref 78.0–100.0)
Platelets: 252 10*3/uL (ref 150–400)
RBC: 3.96 MIL/uL (ref 3.87–5.11)
RDW: 16.4 % — ABNORMAL HIGH (ref 11.5–15.5)
WBC: 8.7 10*3/uL (ref 4.0–10.5)

## 2010-03-08 LAB — HEMOGLOBIN AND HEMATOCRIT, BLOOD
HCT: 22 % — ABNORMAL LOW (ref 36.0–46.0)
Hemoglobin: 6.8 g/dL — CL (ref 12.0–15.0)

## 2010-03-08 LAB — TYPE AND SCREEN
ABO/RH(D): O POS
Antibody Screen: NEGATIVE

## 2010-03-08 LAB — URINE MICROSCOPIC-ADD ON

## 2010-03-08 LAB — RPR: RPR Ser Ql: NONREACTIVE

## 2010-03-09 LAB — CBC
HCT: 19.1 % — ABNORMAL LOW (ref 36.0–46.0)
Hemoglobin: 6 g/dL — CL (ref 12.0–15.0)
MCH: 19.8 pg — ABNORMAL LOW (ref 26.0–34.0)
MCHC: 31.4 g/dL (ref 30.0–36.0)
MCV: 63 fL — ABNORMAL LOW (ref 78.0–100.0)
Platelets: 208 10*3/uL (ref 150–400)
RBC: 3.03 MIL/uL — ABNORMAL LOW (ref 3.87–5.11)
RDW: 16.4 % — ABNORMAL HIGH (ref 11.5–15.5)
WBC: 18.6 10*3/uL — ABNORMAL HIGH (ref 4.0–10.5)

## 2010-03-10 ENCOUNTER — Encounter (HOSPITAL_COMMUNITY)
Admission: RE | Admit: 2010-03-10 | Discharge: 2010-03-10 | Disposition: A | Discharge status: Home / Self Care | Payer: 59 | Source: Ambulatory Visit | Attending: Obstetrics and Gynecology | Admitting: Obstetrics and Gynecology

## 2010-03-10 DIAGNOSIS — O923 Agalactia: Secondary | ICD-10-CM | POA: Insufficient documentation

## 2010-03-10 NOTE — Op Note (Signed)
NAME:  Jamie Mccarthy, Jamie Mccarthy               ACCOUNT NO.:  [id number]  MEDICAL RECORD NO.:  [id number]           PATIENT TYPE:  I  LOCATION:  9318                          FACILITY:  [facility]  PHYSICIAN:  Huel Cote, M.D. DATE OF BIRTH:  1975/12/11  DATE OF PROCEDURE:  03/08/2010 DATE OF DISCHARGE:                              OPERATIVE REPORT   PREOPERATIVE DIAGNOSES: 1. Preterm pregnancy at 30-3/7 weeks' gestation with twins. 2. Active labor. 3. Transverse-transverse presentation. 4. Anemia. 5. Fibroid uterus.  POSTOPERATIVE DIAGNOSIS: 1. Preterm pregnancy at 30-3/7 weeks' gestation with twins. 2. Active labor. 3. Transverse-transverse presentation. 4. Anemia. 5. Fibroid uterus.  PROCEDURE:  Primary cesarean section with a T'd incision of the uterus.  SURGEON:  Huel Cote, M.D.  ASSISTANT:  Zenaida Niece, M.D.  ANESTHESIA:  Spinal.  FINDINGS:  The first twin A was a viable female infant who was lying transverse back posterior and weight was 3 pounds 3 ounces.  Apgars were 8 and 9.  Twin B was a viable female, transverse with feet down, weight was 3 pounds 8 ounces.  Apgars were 7 and 9.  There was a large right fundal fibroid approximately 7 to 8 cm in diameter and a smaller parasitic lower uterine segment right fibroid which was in the peritoneum hanging off of the uterus, ovaries were normal bilaterally as well as tubes.  SPECIMEN:  Placenta and fibroid that was parasitic and removed were sent to Pathology.  ESTIMATED BLOOD LOSS:  100 mL.  URINE OUTPUT:  175 mL clear concentrated.  IV FLUIDS:  2200 mL LR.  There were no known complications except for the relative anemia with which the patient started the case at 7.8.  PROCEDURE:  The patient had presented to [facility] with a complaint of contractions, and some vaginal bleeding and on exam was found to be 5 cm dilated with bulging bag of water.  Ultrasound revealed that the twins were transverse-transverse, and  the patient was counseled to proceed with a cesarean section due to the OR being busy it did take approximately an hour and a half to give the patient cesarean section when she was sitting up for her spinal, she had rupture of membranes with clear fluid noted and shortly thereafter was examined with an elbow found to be presenting in the vagina.  Once the spinal was tested and the patient prepped and draped in the normal sterile fashion with a dorsal tilt.  A Pfannenstiel skin incision was then made with scalpel and carried through to underlying layer of fascia by sharp dissection and Bovie cautery.  The fascia was nicked in the midline and the incision was extended laterally with Mayo scissors.  The inferior aspect was elevated and dissected off the underlying rectus muscles.  The superior aspect was likewise dissected off the rectus muscles.  These were separated in the midline and the peritoneal incision extended both superiorly and inferiorly.  The Alexis self-retaining wound retractor was then placed within the incision.  The bladder flap developed with Metzenbaum scissors.  A low transverse incision was then made on the uterus with a fairly narrow lower uterine  segment, but it appeared to be adequate to complete delivery.  The cavity itself was entered bluntly, and the infant's elbow was found to be presenting down in the vagina and this was retracted upwards and at this point, the fetal head was to the mother's left and the feet and legs well up into the fundus.  Attempts were made to reduce the shoulder and the elbow superiorly and bring the head down to the incision.  This was attempted multiple times and the shoulder was not reducible therefore the decision was made to T the incision.  This was done with bandage scissors and once the incision was T'd, the infant's head was delivered atraumatically.  Nose and mouth were bulb suctioned and the remainder of the infant delivered with  the cord cut and clamped.  This infant was handed off to the awaiting pediatricians.  The second bag was then presenting and the feet were felt at that point.  The rest of the membranes ruptured and this twin B was noted to be in a transverse position with the feet down.  Once the hips could be palpated and reduced downward, the infant delivered in a breech position without difficulty and the head delivered without trauma.  Nose and mouth were bulb suctioned, and the infant was handed to the handed also to the awaiting pediatricians after the cord was cut. The cord blood and pH were then obtained for both babies with twin A, cord pH not obtainable.  The placentas were then expressed manually, and the uterus cleared of all clots and debris.  The uterus was then exteriorized and the T'd part of the fundal incision was closed in 2 layers of running locked 1-0 chromic.  Once this was completely closed, and appeared hemostatic.  The uterus was returned to the abdomen.  The uterine incision was then closed in two layers along the transverse portion as well, also running locked 1-0 chromic.  At that the conclusion of the closure, there was an area on the lower right aspect which was bleeding consistent with the uterine artery bleeding.  This did take several suture ligatures to control but after 2 suture ligatures of 1-0 chromic in a figure-of-eight suture and one figure-of- eight suture of 3-0 Vicryl.  This was well-controlled and observed carefully for several minutes with no active bleeding noted.  The pelvis and abdomen were irrigated, and the remainder of the incision as well after close observation.  There was no active bleeding along the portion or the right area where the uterine artery had of bleeding prior. Therefore the Alexis retractor was removed from the abdomen, and the rectus muscles and peritoneum were then reapproximated with several interrupted mattress sutures of 2-0 Vicryl.   The fascia was then closed with 0 Vicryl in a running fashion and the subcutaneous tissue was reapproximated with 2-0 plain in a running fashion and the skin was closed with staples.  Sponge, lap, and needle counts were correct x2. The patient was taken to the recovery room.  She was in stable condition and will have a hemoglobin checked in the recovery room.     Huel Cote, M.D.    KR/MEDQ  D:  03/08/2010  T:  03/09/2010  Job:  [id number]  Electronically Signed by Huel Cote M.D. on 03/10/2010 06:07:37 PM

## 2010-03-21 NOTE — Discharge Summary (Signed)
NAME:  Jamie Mccarthy, Jamie Mccarthy               ACCOUNT NO.:  [id number]  MEDICAL RECORD NO.:  [id number]           PATIENT TYPE:  I  LOCATION:  9318                          FACILITY:  [facility]  PHYSICIAN:  Huel Cote, M.D. DATE OF BIRTH:  11-29-1975  DATE OF ADMISSION:  03/08/2010 DATE OF DISCHARGE:  03/12/2010                              DISCHARGE SUMMARY   DISCHARGE DIAGNOSES: 1. Preterm pregnancy at 30 plus weeks delivered. 2. Twin gestation. 3. Active labor with transverse transverse lie. 4. Anemia. 5. Fibroids. 6. Status post primary C-section with T'd incision of the uterus, do     not recommend future labor.  DISCHARGE MEDICATIONS: 1. Motrin 600 mg p.o. every 6 hours p.r.n. 2. Tylenol #3, 1-2 tablets p.o. every 4 hours p.r.n.  DISCHARGE FOLLOWUP:  The patient is to follow up in the office in 2 weeks for an incision check and in 6 weeks for her full postpartum exam. She plans to use the minipill for birth control.  HOSPITAL COURSE:  The patient is a 35 year old G1, P1-0-0-1 who was admitted at 30-3/[redacted] weeks gestation by her LMP consistent with an early ultrasound with a twin gestation presenting with contractions and bleeding in active labor.  Her prenatal care had been complicated by the twin gestation, previa early on that resolved on followup ultrasound and HSV history.  The twins had been growing concordantly and doing well on monitoring.  PAST OBSTETRICAL HISTORY:  Significant for a vaginal delivery at term of a 7 pound 2 ounce infant.  PAST MEDICAL HISTORY:  History of fibroids with one significantly larger, hypertension, HSV, and a sickle trait carrier.  PAST SURGICAL HISTORY:  She had lap band procedure performed.  ALLERGIES:  LATEX SENSITIVE.  MEDICATIONS:  None.  Prenatal labs are as follows:  O+ antibody negative, rubella immune, hepatitis B surface antigen negative, HIV negative, GC negative, rubella immune, sickle AS, Chlamydia negative, gonorrhea  negative, 1-hour Glucola elevated, 3-hour Glucola within normal limits.  The patient presented as stated to maternity admissions with bleeding and contractions.  She was examined and found to be 5 cm dilated with bulging bag of water and a transverse lie of baby A.  At this point, cesarean section was called and discussed with the patient in detail. Due to the OR being busy, we actually did not undergo cesarean section for an additional hour to hour and half and at that point when the patient was sitting up for her spinal, her membranes ruptured with clear fluid noted.  She was lied down and prepped for cesarean section with cervical exam confirming a complete dilation and an elbow presenting at the cervical os.  Fetal heart rate for staple x2 prior to incision by ultrasound.  The patient then had a primary cesarean section which did require a teeing of the incision of the uterus to effect delivery of baby A which was wedged in a deep transverse position and could not be reduced to the breech or vertex.  She was delivered of viable twin A, 3 pounds 3 ounces female, Apgar's were 8 and 9 and a viable twin B female, Apgar's 7  and 9 at 3 pounds 8 ounces.  She had a large right fundal fibroid, antiparasitic right forehead fibroid that was removed from the broad ligament on the right.  She was anemic starting out, had a hemoglobin of 7.8 and her hemoglobin dropped to 6.0 on postpartum day #1. She, however, did quite well with this, was ambulating fine, having no difficulty tolerating a regular diet and her pain was well controlled.  By postop day #4, she had learnt to pump and was having good results with obtaining breast milk.  Her pain was controlled.  She was ambulating and tolerating her regular diet.  Her incision was well- approximated and staples removed and Steri-Strips placed and she was felt stable for discharge home.  She was given instructions on pelvic rest and follow up in the  office in 2 weeks.     Huel Cote, M.D.     KR/MEDQ  D:  03/12/2010  T:  03/12/2010  Job:  [id number]  Electronically Signed by Huel Cote M.D. on 03/21/2010 08:47:11 AM

## 2010-04-10 ENCOUNTER — Encounter (HOSPITAL_COMMUNITY)
Admission: RE | Admit: 2010-04-10 | Discharge: 2010-04-10 | Disposition: A | Discharge status: Home / Self Care | Payer: 59 | Source: Ambulatory Visit | Attending: Obstetrics and Gynecology | Admitting: Obstetrics and Gynecology

## 2010-04-10 DIAGNOSIS — O923 Agalactia: Secondary | ICD-10-CM | POA: Insufficient documentation

## 2010-05-02 LAB — URINALYSIS, MICROSCOPIC ONLY
Bilirubin Urine: NEGATIVE
Specific Gravity, Urine: 1.02 (ref 1.005–1.030)
Urobilinogen, UA: 0.2 mg/dL (ref 0.0–1.0)

## 2010-05-02 LAB — CBC
HCT: 31.2 % — ABNORMAL LOW (ref 36.0–46.0)
Hemoglobin: 9.2 g/dL — ABNORMAL LOW (ref 12.0–15.0)
MCHC: 32.9 g/dL (ref 30.0–36.0)
MCV: 68.6 fL — ABNORMAL LOW (ref 78.0–100.0)
Platelets: 331 10*3/uL (ref 150–400)
Platelets: 372 10*3/uL (ref 150–400)
RBC: 4.03 MIL/uL (ref 3.87–5.11)
RBC: 4.55 MIL/uL (ref 3.87–5.11)
RDW: 16.7 % — ABNORMAL HIGH (ref 11.5–15.5)
WBC: 10.1 10*3/uL (ref 4.0–10.5)
WBC: 12.6 10*3/uL — ABNORMAL HIGH (ref 4.0–10.5)

## 2010-05-02 LAB — COMPREHENSIVE METABOLIC PANEL
ALT: <8 U/L (ref 0–35)
Alkaline Phosphatase: 135 U/L — ABNORMAL HIGH (ref 39–117)
Alkaline Phosphatase: 98 U/L (ref 39–117)
BUN: 2 mg/dL — ABNORMAL LOW (ref 6–23)
CO2: 25 mEq/L (ref 19–32)
Chloride: 103 mEq/L (ref 96–112)
Chloride: 107 mEq/L (ref 96–112)
Creatinine, Ser: 0.55 mg/dL (ref 0.4–1.2)
Glucose, Bld: 129 mg/dL — ABNORMAL HIGH (ref 70–99)
Glucose, Bld: 75 mg/dL (ref 70–99)
Potassium: 3.7 mEq/L (ref 3.5–5.1)
Potassium: 4.2 mEq/L (ref 3.5–5.1)
Sodium: 137 mEq/L (ref 135–145)
Total Bilirubin: 1.1 mg/dL (ref 0.3–1.2)
Total Protein: 5.5 g/dL — ABNORMAL LOW (ref 6.0–8.3)

## 2010-05-02 LAB — URIC ACID: Uric Acid, Serum: 4.6 mg/dL (ref 2.4–7.0)

## 2010-05-02 LAB — LACTATE DEHYDROGENASE
LDH: 182 U/L (ref 94–250)
LDH: 232 U/L (ref 94–250)

## 2010-05-11 ENCOUNTER — Encounter (HOSPITAL_COMMUNITY)
Admission: RE | Admit: 2010-05-11 | Discharge: 2010-05-11 | Disposition: A | Discharge status: Home / Self Care | Payer: 59 | Source: Ambulatory Visit | Attending: Obstetrics and Gynecology | Admitting: Obstetrics and Gynecology

## 2010-05-11 DIAGNOSIS — O923 Agalactia: Secondary | ICD-10-CM | POA: Insufficient documentation

## 2010-06-06 NOTE — Discharge Summary (Signed)
Jamie Mccarthy, Jamie Mccarthy                ACCOUNT NO.:  [id number]   MEDICAL RECORD NO.:  [id number]          PATIENT TYPE:  INP   LOCATION:  9112                          FACILITY:  [facility]   PHYSICIAN:  Sherron Monday, MD        DATE OF BIRTH:  10-15-1975   DATE OF ADMISSION:  06/15/2008  DATE OF DISCHARGE:  06/17/2008                               DISCHARGE SUMMARY   ADMITTING DIAGNOSIS:  Intrauterine pregnancy at term with spontaneous  rupture of membranes.   DISCHARGE DIAGNOSIS:  Intrauterine pregnancy at term with spontaneous  rupture of membranes, delivered via spontaneous vaginal delivery.   HISTORY OF PRESENT ILLNESS:  A 35 year old, G1, P0, at 38 plus weeks  with spontaneous rupture of membrane and regular painful contractions.  Her pregnancy is complicated by some nausea, vomiting, bleeding, as well  as early contractions.  She has had contractions this morning since exam  of the rupture of membranes at 7:45 for clear fluid.  She has had good  fetal movement.   Past medical history is significant for hypertension.  She has been off  her meds since prior to the pregnancy.  They have been mildly elevated  throughout the pregnancy.  This was watched during her hospital course.   Past surgical history is significant for Lap-Band surgery as well as a  wisdom tooth extraction.   PAST OBSTETRIC/GYNECOLOGIC HISTORY:  G1 is present pregnancy with a  history of fibroid uterus.  She has had an abnormal Pap smear and a  colpo but it has been normal since.   SEXUALLY TRANSMITTED DISEASES:  She has a history of herpes and  Chlamydia   MEDICATIONS:  None.   ALLERGIES:  LATEX.   SOCIAL HISTORY:  Denies alcohol, tobacco, or drug use.  She is divorced.   Family history is significant for diabetes in maternal and paternal  uncle, breast cancer maternal great-grandmother, congestive heart  failure in maternal uncle and maternal great uncle, father and uncle  with a defibrillator placed,  hypertension in mother and maternal uncle.   PRENATAL LABORATORY DATA:  Hemoglobin 0.7, platelet 341,000.  O  positive.  Antibody screen negative.  Gonorrhea negative.  Chlamydia  negative, RPR nonreactive, rubella immune.  Hemoglobin electrophoresis  revealed sickle trait.  Hepatitis B surface antigen negative.  HIV  negative.  First trimester screen within normal limits.  AFP within  normal limits.  Glucola 132.  Group B strep negative.  Cystic fibrosis  screen negative.  An ultrasound performed at 33 weeks given estimated  fetal weight of 5 pounds 5 ounces which revealed polyhydramnios in a  female infant.  On March 11, 2008, at 24 weeks, good growth and  fibroids were noted.  Ultrasound on November 24, 2007, confirmed the [facility]  of June 27, 2008, normal anatomy, anterior placenta, and female infant.   On admission, she had a benign exam with fetal heart tones 120s and  reactive with 4 cm dilated, 90% effaced, and 0 station, grossly  ruptured.  She progressed to complete +2 push for 10-15 minutes to  deliver a viable female  infant at 12:11 with Apgars of 9 at 1 minute and  9 at 5 minutes and a weight of 7 pounds 2 ounces.  Placenta was  expressed intact at 12:15 with the OB lacerations, bilateral labial  lacerations on first-degree perineal that were repaired with 3-0 Vicryl  in the typical fashion.  Postpartum course was relatively uncomplicated.  She remained afebrile.  Her blood pressures were mildly elevated.  PIH  labs were checked and found to be normal.  Her hemoglobin decreased from  10.3-8.7.  On postpartum day 2, she had no complaints, normal lochia,  and her pain was well controlled.  She was given routine discharge  instructions and numbers to call with any questions or problems as well  as instructions to check her blood pressure and precautions of  preeclampsia.  She voiced understanding to all this.  She was also given  prescriptions for Motrin, Vicodin, prenatal  vitamins.  She will follow  up in the office in 6 weeks.   DISCHARGE INFORMATION:  O positive, rubella immune.  Plans to breast  feed and is doing very well with this.  She would like to have an IUD  placed at her postpartum checkup.  Hemoglobin decreased from 10.3-8.7.  She will be taking over-the-counter iron as well.      Sherron Monday, MD  Electronically Signed     JB/MEDQ  D:  06/17/2008  T:  06/17/2008  Job:  [id number]

## 2010-06-11 ENCOUNTER — Encounter (HOSPITAL_COMMUNITY)
Admission: RE | Admit: 2010-06-11 | Discharge: 2010-06-11 | Disposition: A | Discharge status: Home / Self Care | Payer: 59 | Source: Ambulatory Visit | Attending: Obstetrics and Gynecology | Admitting: Obstetrics and Gynecology

## 2010-06-11 DIAGNOSIS — O923 Agalactia: Secondary | ICD-10-CM | POA: Insufficient documentation

## 2010-06-14 ENCOUNTER — Ambulatory Visit (INDEPENDENT_AMBULATORY_CARE_PROVIDER_SITE_OTHER): Payer: 59 | Admitting: Family Medicine

## 2010-06-14 ENCOUNTER — Encounter: Payer: Self-pay | Admitting: Family Medicine

## 2010-06-14 DIAGNOSIS — K529 Noninfective gastroenteritis and colitis, unspecified: Secondary | ICD-10-CM

## 2010-06-14 DIAGNOSIS — K5289 Other specified noninfective gastroenteritis and colitis: Secondary | ICD-10-CM

## 2010-06-14 DIAGNOSIS — J309 Allergic rhinitis, unspecified: Secondary | ICD-10-CM | POA: Insufficient documentation

## 2010-06-14 MED ORDER — PROMETHAZINE HCL 25 MG RE SUPP: 25.0000 mg | Freq: Four times a day (QID) | RECTAL | Status: DC | PRN

## 2010-06-14 NOTE — Progress Notes (Signed)
  Subjective:    Patient ID: Jamie Mccarthy, female    DOB: 08/25/1975, 35 y.o.   MRN: [id number]  HPI  Jamie Mccarthy had a dec appetitite and starting vomiting. No loose stools, abd cramping.  Low grade fever to 100 degree. Last time vomited. She is not sure if viral or if her reflux. She has had vomiting with her reflux in the past but usually not with this frequency. She usually takes ranitidine 75mg  bid.       She overall does feel better today and the last time she vomited was earlier this morning. No one else at home has been sick.        Nasal congestion for several weeks. She's not had a fever or cold symptoms. She feels is her allergies. She's been taking over-the-counter Claritin once daily and says that it helps but doesn't completely eliminate her symptoms. She also has frequent sneezing. She also has a runny nose. She would like to know what she can take for her symptoms.  Review of Systems     Objective:   Physical Exam  Constitutional: She appears well-developed and well-nourished.       She is obese  HENT:  Head: Normocephalic and atraumatic.  Neck: Neck supple. No thyromegaly present.  Cardiovascular: Normal rate, regular rhythm and normal heart sounds.   Pulmonary/Chest: Effort normal and breath sounds normal.  Abdominal: Soft. Bowel sounds are normal. She exhibits no distension and no mass. There is tenderness. There is no rebound and no guarding.       Generalized mild tenderness  Lymphadenopathy:    She has no cervical adenopathy.  Skin: Skin is warm and dry.  Psychiatric: She has a normal mood and affect. Her behavior is normal.          Assessment & Plan:  Viral gastroenteritis-I think this is the most likely diagnosis. She's actually feeling better today. I do not think this is any acute process. I did send her for some Phenergan for her to take today to get some rest and work on getting hydrated. I did give her a work note for today as well. If the vomiting  persists please call office and let me know. I did go ahead and give her samples of a PPI to take for the next 10 days to get her stomach calm back down. After that she can switch back to her ranitidine.

## 2010-06-14 NOTE — Assessment & Plan Note (Signed)
I recommend she continue her oral Claritin. If she wanted to she could try switching to Zyrtec at bedtime. In addition to this I did give her a sample of dermis to try for the next couple weeks and see if that helps alleviate her nasal congestion. If it helps him considerably try calling and generic Flonase. If she is not significantly better than she can let us know.

## 2010-06-16 ENCOUNTER — Encounter: Payer: Self-pay | Admitting: Family Medicine

## 2010-07-12 ENCOUNTER — Encounter (HOSPITAL_COMMUNITY)
Admission: RE | Admit: 2010-07-12 | Discharge: 2010-07-12 | Disposition: A | Discharge status: Home / Self Care | Payer: 59 | Source: Ambulatory Visit | Attending: Obstetrics and Gynecology | Admitting: Obstetrics and Gynecology

## 2010-07-12 DIAGNOSIS — O923 Agalactia: Secondary | ICD-10-CM | POA: Insufficient documentation

## 2010-07-27 ENCOUNTER — Encounter: Payer: Self-pay | Admitting: Family Medicine

## 2010-07-28 ENCOUNTER — Encounter: Payer: Self-pay | Admitting: Family Medicine

## 2010-07-28 ENCOUNTER — Ambulatory Visit (INDEPENDENT_AMBULATORY_CARE_PROVIDER_SITE_OTHER): Payer: 59 | Admitting: Family Medicine

## 2010-07-28 VITALS — BP 131/93 | HR 86 | Temp 97.5°F | Resp 20 | Ht 65.0 in | Wt 220.0 lb

## 2010-07-28 DIAGNOSIS — J329 Chronic sinusitis, unspecified: Secondary | ICD-10-CM

## 2010-07-28 DIAGNOSIS — Z9884 Bariatric surgery status: Secondary | ICD-10-CM

## 2010-07-28 MED ORDER — CETIRIZINE HCL 10 MG PO TABS: 10.0000 mg | ORAL_TABLET | Freq: Every day | ORAL | Status: DC

## 2010-07-28 MED ORDER — AZITHROMYCIN 250 MG PO TABS: ORAL_TABLET | ORAL | Status: AC

## 2010-07-28 NOTE — Progress Notes (Signed)
  Subjective:    Patient ID: Jamie Mccarthy, female    DOB: [birth year], 35 y.o.   MRN: [id number]  HPI  Cough and congestion. Lots of drainage and now very congested.  Voice is raspy. No fever. Had initially ST now resolved.  Sxs started about 1 week ago.  Worse in the last days. No ear pain or pressure.  No meds for sxs.  Mild cough is productive.  HA nad facial pressure and pain on both sides.   Review of Systems     Objective:   Physical Exam  Constitutional: She is oriented to person, place, and time. She appears well-developed and well-nourished.  HENT:  Head: Normocephalic and atraumatic.  Right Ear: External ear normal.  Left Ear: External ear normal.  Nose: Nose normal.  Mouth/Throat: Oropharynx is clear and moist.       TMs and canals are clear.   Eyes: Conjunctivae and EOM are normal. Pupils are equal, round, and reactive to light.  Neck: Neck supple. No thyromegaly present.  Cardiovascular: Normal rate, regular rhythm and normal heart sounds.   Pulmonary/Chest: Effort normal and breath sounds normal. She has no wheezes.  Lymphadenopathy:    She has no cervical adenopathy.  Neurological: She is alert and oriented to person, place, and time.  Skin: Skin is warm and dry.  Psychiatric: She has a normal mood and affect.          Assessment & Plan:  Sinusitis- Tx with ABX. Call if not better in one week.  Recommend start an antihistamine as well. Can try zyrtec. She feels this works better for her.

## 2010-08-07 LAB — HM PAP SMEAR: HM PAP: NORMAL

## 2010-08-12 ENCOUNTER — Encounter (HOSPITAL_COMMUNITY)
Admission: RE | Admit: 2010-08-12 | Discharge: 2010-08-12 | Disposition: A | Discharge status: Home / Self Care | Payer: 59 | Source: Ambulatory Visit | Attending: Obstetrics and Gynecology | Admitting: Obstetrics and Gynecology

## 2010-08-12 DIAGNOSIS — O923 Agalactia: Secondary | ICD-10-CM | POA: Insufficient documentation

## 2010-08-14 ENCOUNTER — Ambulatory Visit (INDEPENDENT_AMBULATORY_CARE_PROVIDER_SITE_OTHER): Payer: 59 | Admitting: Family Medicine

## 2010-08-14 ENCOUNTER — Encounter: Payer: Self-pay | Admitting: Family Medicine

## 2010-08-14 VITALS — BP 143/94 | HR 68 | Ht 66.0 in | Wt 226.0 lb

## 2010-08-14 DIAGNOSIS — D649 Anemia, unspecified: Secondary | ICD-10-CM

## 2010-08-14 LAB — POCT HEMOGLOBIN: Hemoglobin: 7.6

## 2010-08-14 MED ORDER — DEXLANSOPRAZOLE 60 MG PO CPDR: 60.0000 mg | DELAYED_RELEASE_CAPSULE | Freq: Every day | ORAL | Status: AC

## 2010-08-14 NOTE — Progress Notes (Signed)
  Subjective:    Patient ID: Jamie Mccarthy, female    DOB: [birth year], 35 y.o.   MRN: [id number]  HPI  Had low hgb in Feb after having her twins.  Told to get on iron but wasn't really helping.  Has seen the sickle cell clinic since she has the trait.  Repeat hgb was 7.3 about aa week ago at the gyn office.  hgb was a 10 in between after she started eating iron rich diet after having her twins.. Her GYN doctor probably followup with her PCP. She has noticed a little bit more lightheaded. She's had occasional instances of feeling like she might pass out. She has not had any syncopal episodes. She's also noticed more frequent headaches. She's also been slightly short of breath with exercise. She does have a prior history of gastric banding. She does have fairly heavy periods but they are last for 4 days and her regular period she has not been losing blood at any other places. She is on birth control. Review of Systems     Objective:   Physical Exam  Constitutional: She is oriented to person, place, and time. She appears well-developed and well-nourished.  Neurological: She is alert and oriented to person, place, and time.  Skin: Skin is warm and dry.  Psychiatric: She has a normal mood and affect.          Assessment & Plan:  Anemia-we will get blood work today to see if she has iron deficiency, low B12 or folate. She does have sickle cell trait but we know that she can at least have a normal hemoglobin of 10. Her labs are somewhat confusing and I will recommend a hematology referral. I did let her know that if she does pass out or nearly passes out and I do recommend she go to the emergency room. We should have her lab work back by tomorrow morning.

## 2010-08-15 LAB — CBC WITH DIFFERENTIAL/PLATELET
Basophils Absolute: 0 10*3/uL (ref 0.0–0.1)
HCT: 28.9 % — ABNORMAL LOW (ref 36.0–46.0)
Lymphocytes Relative: 58 % — ABNORMAL HIGH (ref 12–46)
Neutro Abs: 2.2 10*3/uL (ref 1.7–7.7)
Neutrophils Relative %: 34 % — ABNORMAL LOW (ref 43–77)
Platelets: 394 10*3/uL (ref 150–400)
RDW: 19.6 % — ABNORMAL HIGH (ref 11.5–15.5)
WBC: 6.4 10*3/uL (ref 4.0–10.5)

## 2010-08-15 LAB — FOLATE: Folate: 7.2 ng/mL

## 2010-08-15 LAB — IRON: Iron: 14 ug/dL — ABNORMAL LOW (ref 42–145)

## 2010-08-15 LAB — FERRITIN: Ferritin: 4 ng/mL — ABNORMAL LOW (ref 10–291)

## 2010-08-16 ENCOUNTER — Telehealth: Payer: Self-pay | Admitting: Family Medicine

## 2010-08-16 ENCOUNTER — Emergency Department (HOSPITAL_COMMUNITY)
Admission: EM | Admit: 2010-08-16 | Discharge: 2010-08-16 | Disposition: A | Discharge status: Home / Self Care | Payer: 59 | Attending: Emergency Medicine | Admitting: Emergency Medicine

## 2010-08-16 DIAGNOSIS — R5381 Other malaise: Secondary | ICD-10-CM | POA: Insufficient documentation

## 2010-08-16 DIAGNOSIS — I10 Essential (primary) hypertension: Secondary | ICD-10-CM | POA: Insufficient documentation

## 2010-08-16 DIAGNOSIS — R109 Unspecified abdominal pain: Secondary | ICD-10-CM | POA: Insufficient documentation

## 2010-08-16 DIAGNOSIS — R42 Dizziness and giddiness: Secondary | ICD-10-CM | POA: Insufficient documentation

## 2010-08-16 DIAGNOSIS — R0602 Shortness of breath: Secondary | ICD-10-CM | POA: Insufficient documentation

## 2010-08-16 DIAGNOSIS — D649 Anemia, unspecified: Secondary | ICD-10-CM | POA: Insufficient documentation

## 2010-08-16 DIAGNOSIS — K219 Gastro-esophageal reflux disease without esophagitis: Secondary | ICD-10-CM | POA: Insufficient documentation

## 2010-08-16 LAB — COMPREHENSIVE METABOLIC PANEL
Albumin: 3.4 g/dL — ABNORMAL LOW (ref 3.5–5.2)
BUN: 9 mg/dL (ref 6–23)
Chloride: 105 mEq/L (ref 96–112)
Creatinine, Ser: 0.65 mg/dL (ref 0.50–1.10)
GFR calc Af Amer: >60 mL/min (ref >60)
Glucose, Bld: 111 mg/dL — ABNORMAL HIGH (ref 70–99)
Total Bilirubin: 0.3 mg/dL (ref 0.3–1.2)

## 2010-08-16 LAB — URINALYSIS, ROUTINE W REFLEX MICROSCOPIC
Glucose, UA: NEGATIVE mg/dL
Nitrite: NEGATIVE
Protein, ur: NEGATIVE mg/dL
Urobilinogen, UA: 0.2 mg/dL (ref 0.0–1.0)

## 2010-08-16 LAB — URINE MICROSCOPIC-ADD ON

## 2010-08-16 LAB — CBC
HCT: 27.6 % — ABNORMAL LOW (ref 36.0–46.0)
MCHC: 30.8 g/dL (ref 30.0–36.0)
MCV: 56.7 fL — ABNORMAL LOW (ref 78.0–100.0)
RDW: 19.1 % — ABNORMAL HIGH (ref 11.5–15.5)

## 2010-08-16 LAB — DIFFERENTIAL
Basophils Relative: 0 % (ref 0–1)
Eosinophils Absolute: 0.1 10*3/uL (ref 0.0–0.7)
Monocytes Absolute: 0.4 10*3/uL (ref 0.1–1.0)
Neutro Abs: 2.4 10*3/uL (ref 1.7–7.7)

## 2010-08-16 NOTE — Telephone Encounter (Signed)
Call patient: Both vitamin B12 and iron are very low. I would like her to start B12 shots once a week for a month and then every other week for a month and then we will recheck her blood level of B12 to determine if she still needs shots every 3 weeks or every 4 weeks. When he does significantly increase her iron intake. An iron rich diet with definitely be helpful. If she can eat things like liver once a week that would help as well. We could also try other types of iron such as liquid iron which I think is over-the-counter to see if she might tolerate that better. We will recheck her labs in 8 weeks.

## 2010-08-16 NOTE — Telephone Encounter (Signed)
Pt informed of recent labwork and instructions given.  B12 and iron levels very low.  Told pt will start B12 injections once week X 1 mth and then nurse at nurse visit can give even more specifics at office visit with nurse, but for now pt has complained of passing out yesterday and lightheaded today and very tired to point of wanting to fall back asleep.  Told will need to get OTC iron supp and if can't tol oral 325 mg pill daily to use liquid iron.  Eat iron rich foods.  Told will recheck labs in 8 weeks.  Plan:  For now, pt was instructed to go to the ED at [facility] since she is close to there to possibly get blood to get iron level up.  Informed Dr. Linford Arnold. Jarvis Newcomer, LPN [facility]

## 2010-08-16 NOTE — Telephone Encounter (Signed)
Pt called and said she went to the ER this morning and they sent her home.  Wants to know what to do.  Hgb 8.6.  No transfusion done .  Gave Rx for daily iron. Plan:  Told the pt to start the iron pill today.  A nurse visit was scheduled for tomorrow am for the B 12 injection. Jarvis Newcomer, LPN [facility]

## 2010-08-17 ENCOUNTER — Ambulatory Visit (INDEPENDENT_AMBULATORY_CARE_PROVIDER_SITE_OTHER): Payer: 59 | Admitting: Family Medicine

## 2010-08-17 VITALS — BP 136/96 | HR 78

## 2010-08-17 DIAGNOSIS — E538 Deficiency of other specified B group vitamins: Secondary | ICD-10-CM

## 2010-08-17 MED ORDER — CYANOCOBALAMIN 1000 MCG/ML IJ SOLN
1000.0000 ug | Freq: Once | INTRAMUSCULAR | Status: AC
  Administered 2010-08-17: 1000 ug via INTRAMUSCULAR

## 2010-08-17 NOTE — Progress Notes (Signed)
  Subjective:    Patient ID: Jamie Mccarthy, female    DOB: 09/10/1975, 35 y.o.   MRN: [id number]  HPI  Here for B12 injection.   Review of Systems     Objective:   Physical Exam        Assessment & Plan:   

## 2010-08-24 ENCOUNTER — Ambulatory Visit (INDEPENDENT_AMBULATORY_CARE_PROVIDER_SITE_OTHER): Payer: 59 | Admitting: Family Medicine

## 2010-08-24 VITALS — BP 130/80 | HR 78

## 2010-08-24 DIAGNOSIS — E538 Deficiency of other specified B group vitamins: Secondary | ICD-10-CM

## 2010-08-24 MED ORDER — CYANOCOBALAMIN 1000 MCG/ML IJ SOLN
1000.0000 ug | Freq: Once | INTRAMUSCULAR | Status: AC
  Administered 2010-08-24: 1000 ug via INTRAMUSCULAR

## 2010-08-24 NOTE — Progress Notes (Signed)
  Subjective:    Patient ID: Jamie Mccarthy, female    DOB: 04-05-1975, 35 y.o.   MRN: [id number]  HPI  B12 injection  Review of Systems     Objective:   Physical Exam        Assessment & Plan:

## 2010-08-29 ENCOUNTER — Telehealth: Payer: Self-pay | Admitting: Family Medicine

## 2010-08-29 DIAGNOSIS — R1011 Right upper quadrant pain: Secondary | ICD-10-CM

## 2010-08-29 DIAGNOSIS — R0789 Other chest pain: Secondary | ICD-10-CM

## 2010-08-29 NOTE — Telephone Encounter (Signed)
Pt called back and said she does still have her gallbladder.  Please advise of further instructions and orders. Jarvis Newcomer, LPN [facility]

## 2010-08-29 NOTE — Telephone Encounter (Signed)
LMTC on the pt number to let me know if still has a gallbladder or not.  Pending her call back. Jarvis Newcomer, LPN [facility]'

## 2010-08-29 NOTE — Telephone Encounter (Signed)
Does she still have her GB?

## 2010-08-29 NOTE — Telephone Encounter (Signed)
Pt called and said she was having chest pain.  Called the pt to inquire about specific symptoms. Pt complaining of pain on the RT chest right behind the right breast.  Rates 4 01/24/08.  Hurts on the RT shoulder, and RT back.  Hurts when she sneezes or coughs on RT side.  Hurts when she inhales.  Her iron is currently low and she has been on iron injections X 2 weeks.  Please advise. Plan:  Routed to Dr. Marlyne Beards, LPN [facility]

## 2010-08-30 NOTE — Telephone Encounter (Signed)
Lets get CXR and [place] of GB. I put in orders.

## 2010-08-30 NOTE — Telephone Encounter (Signed)
Pt notified that orders had been put in for her to have xray and gallbladder ultrasound. Jarvis Newcomer, LPN [facility]

## 2010-08-31 ENCOUNTER — Telehealth: Payer: Self-pay | Admitting: Family Medicine

## 2010-08-31 ENCOUNTER — Ambulatory Visit
Admission: RE | Admit: 2010-08-31 | Discharge: 2010-08-31 | Disposition: A | Discharge status: Home / Self Care | Payer: 59 | Source: Ambulatory Visit | Attending: Family Medicine | Admitting: Family Medicine

## 2010-08-31 ENCOUNTER — Ambulatory Visit (INDEPENDENT_AMBULATORY_CARE_PROVIDER_SITE_OTHER): Payer: 59 | Admitting: Family Medicine

## 2010-08-31 ENCOUNTER — Other Ambulatory Visit: Payer: 59

## 2010-08-31 DIAGNOSIS — R0789 Other chest pain: Secondary | ICD-10-CM

## 2010-08-31 DIAGNOSIS — D510 Vitamin B12 deficiency anemia due to intrinsic factor deficiency: Secondary | ICD-10-CM

## 2010-08-31 IMAGING — CR DG CHEST 2V
2 series · 2 of 2 positions shown · non-contrast
Comparison: None.

CLINICAL DATA: Right-sided chest pain radiating to the right arm

CHEST - 2 VIEW

[view not recorded (1 of 2)]
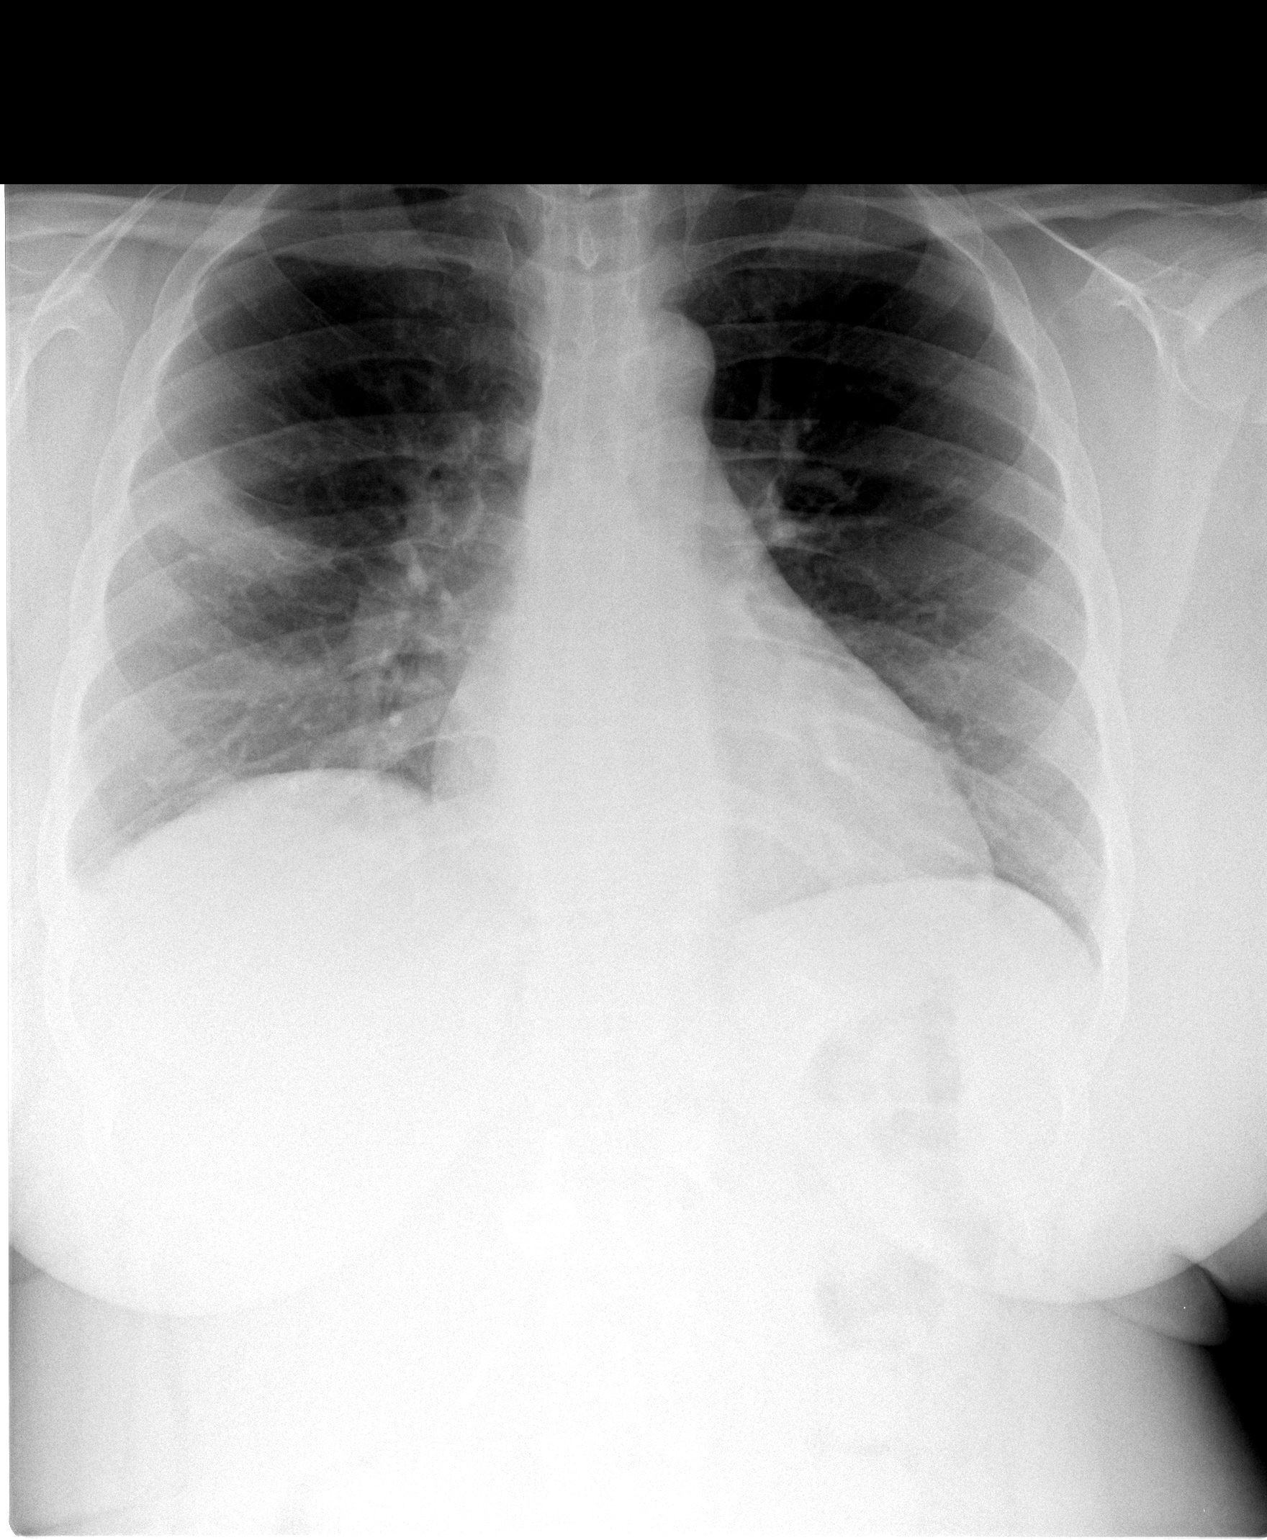

[view not recorded (2 of 2)]
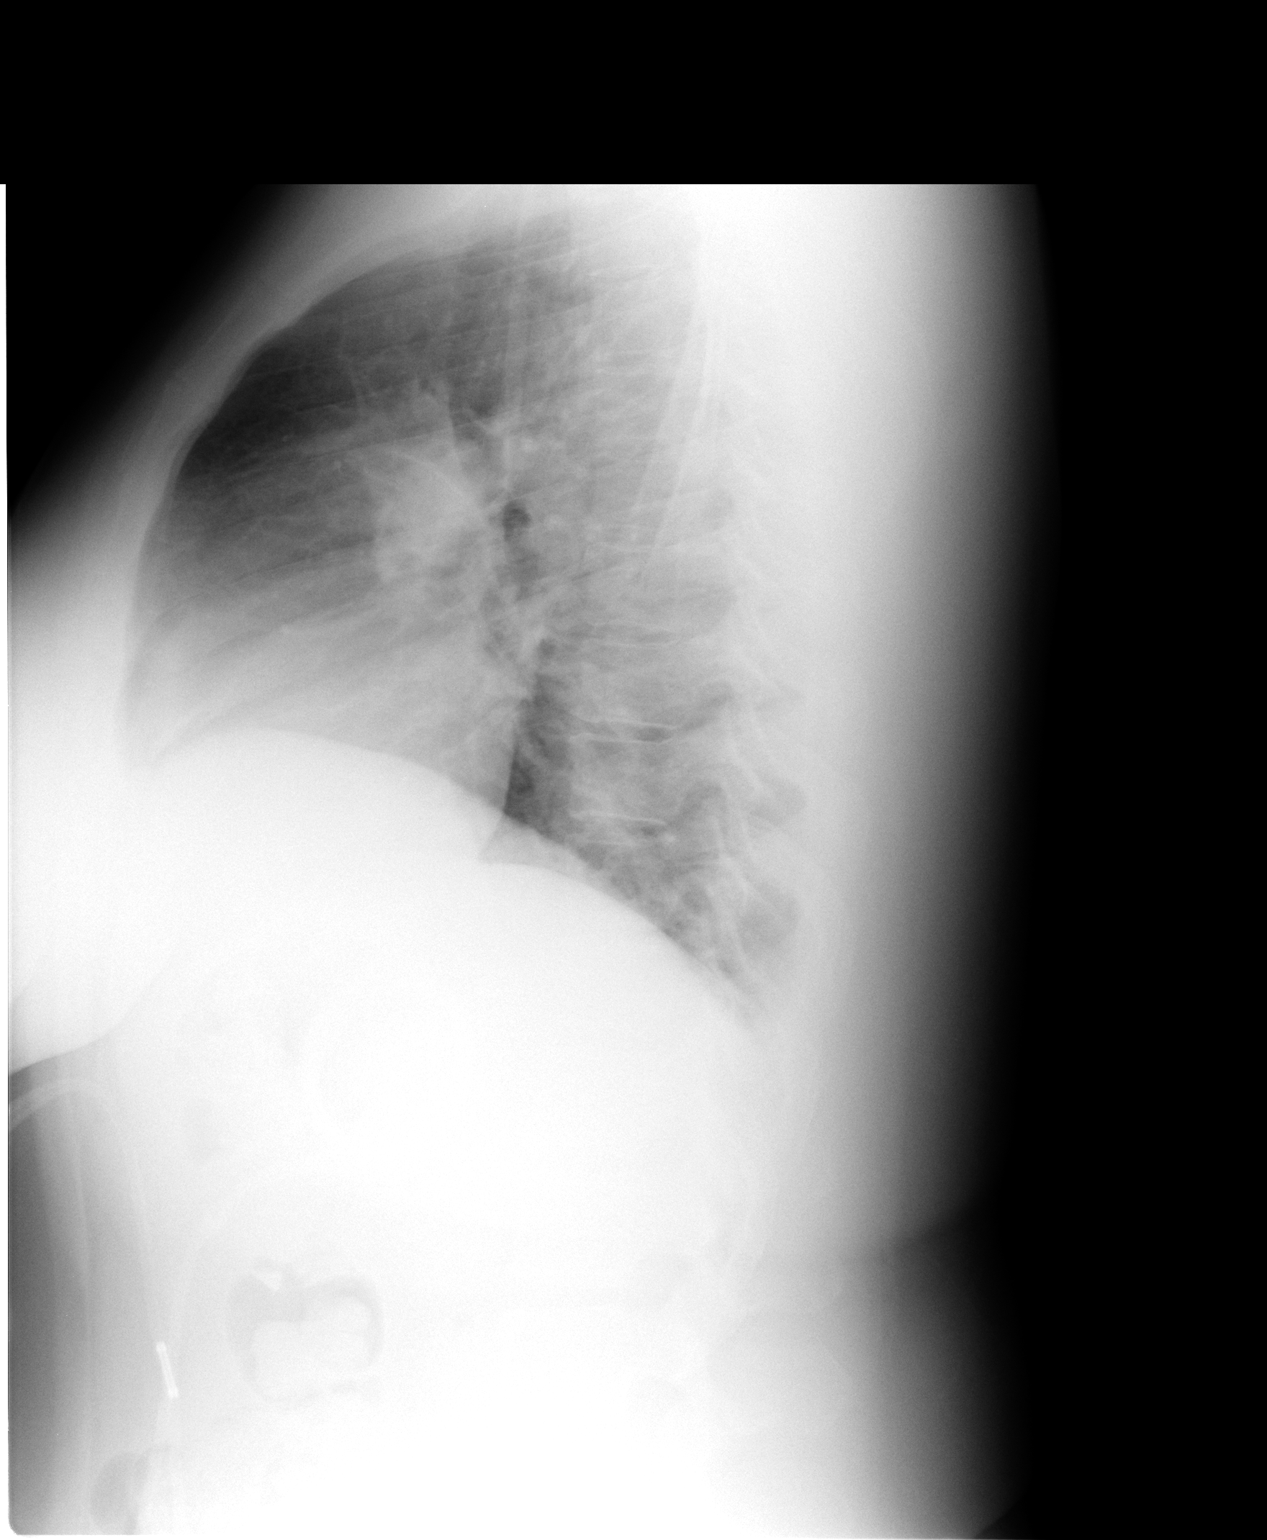

[2 of 2 positions shown; findings below may reference images not displayed]

FINDINGS: There is parenchymal opacity in the mid right lung
probably in the right upper lobe suspicious for a focus of
pneumonia.  Follow-up chest x-ray is recommended to ensure
clearing.  The remainder of the lungs are well aerated.  No pleural
effusion is seen.  Mediastinal contours appear normal.  The heart
is within upper limits of normal.  No bony abnormality is noted.
IMPRESSION: Right mid lung opacity probably within the right upper lobe, most
consistent with pneumonia.  Recommend follow-up to ensure clearing.

## 2010-08-31 MED ORDER — CYANOCOBALAMIN 1000 MCG/ML IJ SOLN
1000.0000 ug | Freq: Once | INTRAMUSCULAR | Status: AC
  Administered 2010-08-31: 1000 ug via INTRAMUSCULAR

## 2010-08-31 MED ORDER — AZITHROMYCIN 250 MG PO TABS: ORAL_TABLET | ORAL | Status: AC

## 2010-08-31 NOTE — Progress Notes (Signed)
  Subjective:    Patient ID: Jamie Mccarthy, female    DOB: 1975-10-12, 35 y.o.   MRN: [id number]  HPI  Here for B12 injection.   Review of Systems     Objective:   Physical Exam        Assessment & Plan:

## 2010-08-31 NOTE — Telephone Encounter (Signed)
Called LMOM:  Chest x-ray looks like she has pneumonia. I will send an antibiotic. She is to followup with me in 2 weeks.Pls try to call her again.

## 2010-09-01 ENCOUNTER — Other Ambulatory Visit: Payer: 59

## 2010-09-01 ENCOUNTER — Ambulatory Visit
Admission: RE | Admit: 2010-09-01 | Discharge: 2010-09-01 | Disposition: A | Discharge status: Home / Self Care | Payer: 59 | Source: Ambulatory Visit | Attending: Family Medicine | Admitting: Family Medicine

## 2010-09-01 DIAGNOSIS — R1011 Right upper quadrant pain: Secondary | ICD-10-CM

## 2010-09-01 IMAGING — US US ABDOMEN COMPLETE
1 series · 14 of 25 positions shown · non-contrast
Comparison: None

CLINICAL DATA: Right upper quadrant abdominal pain.

COMPLETE ABDOMINAL ULTRASOUND

[Series 1: us abdomen complete · 0.30mm/px · 14 of 95 slices shown]
[im 1/95]
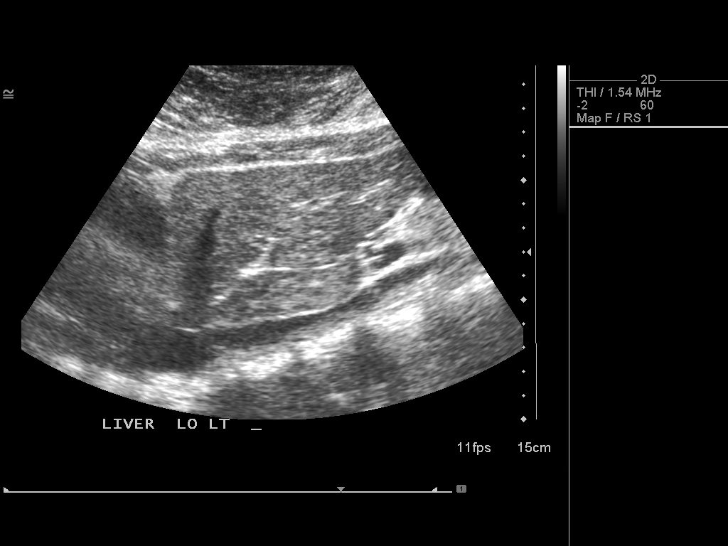
[im 8/95]
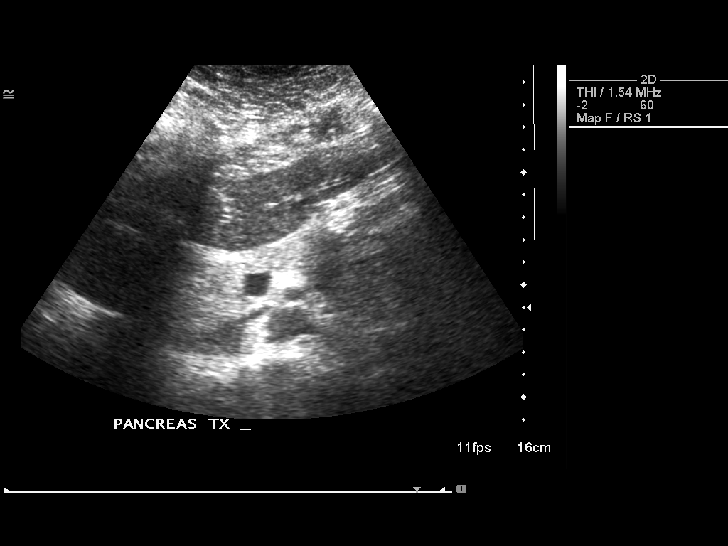
[im 16/95]
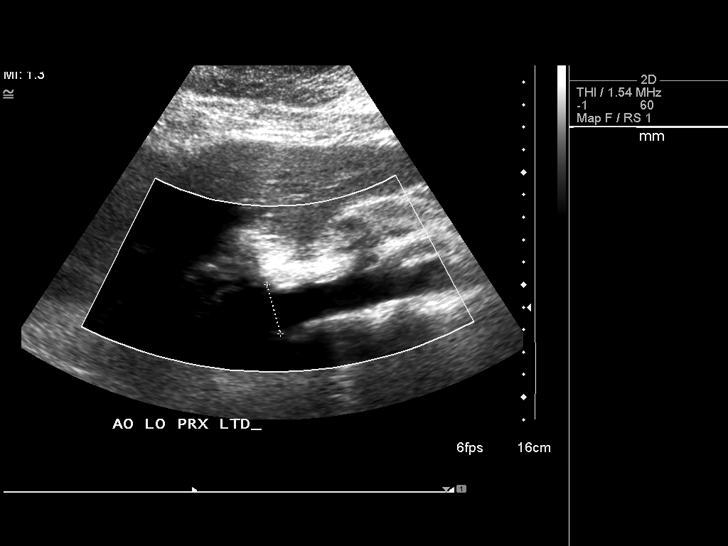
[im 24/95]
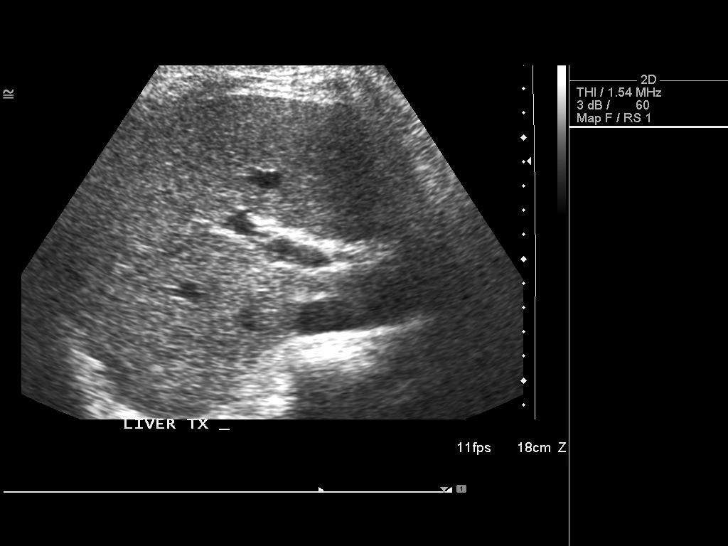
[im 32/95]
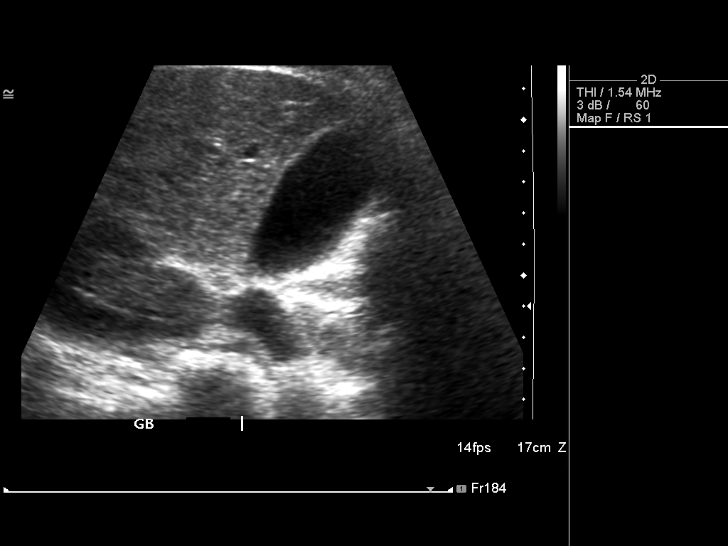
[im 36/95]
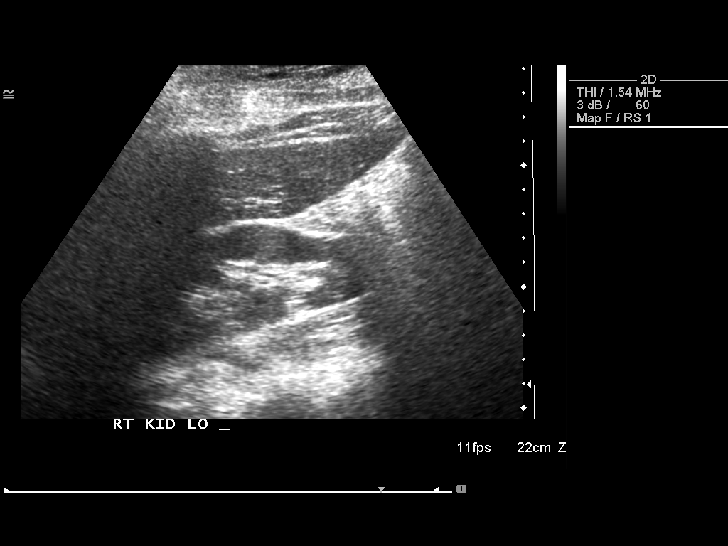
[im 44/95]
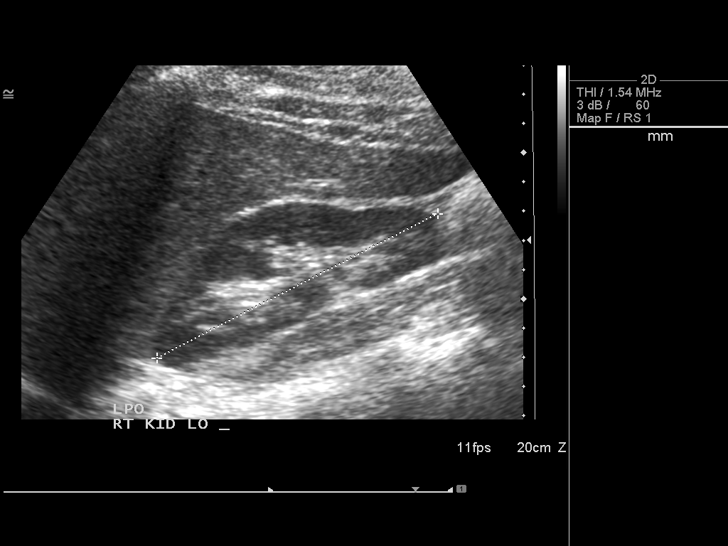
[im 51/95]
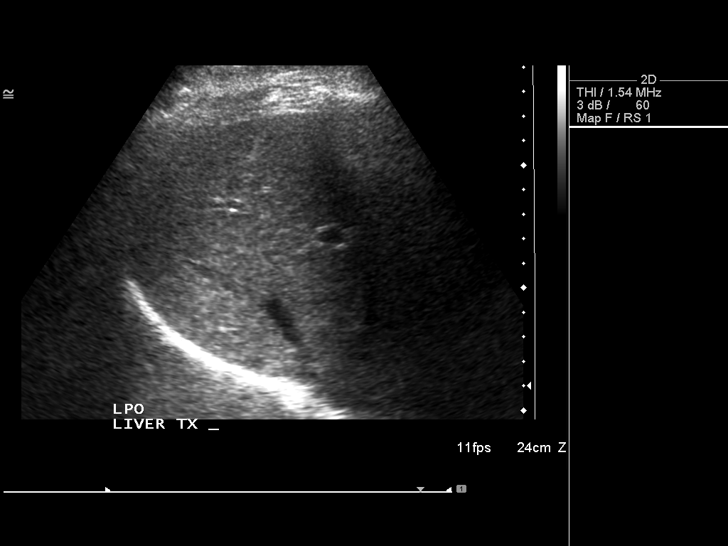
[im 59/95]
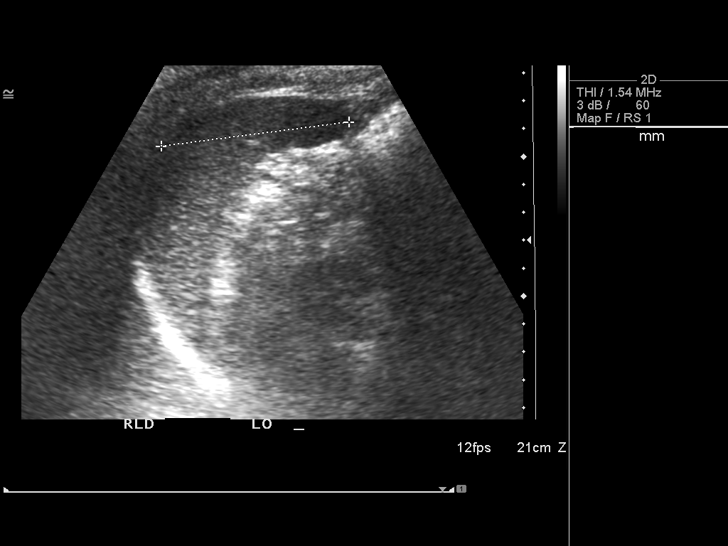
[im 63/95]
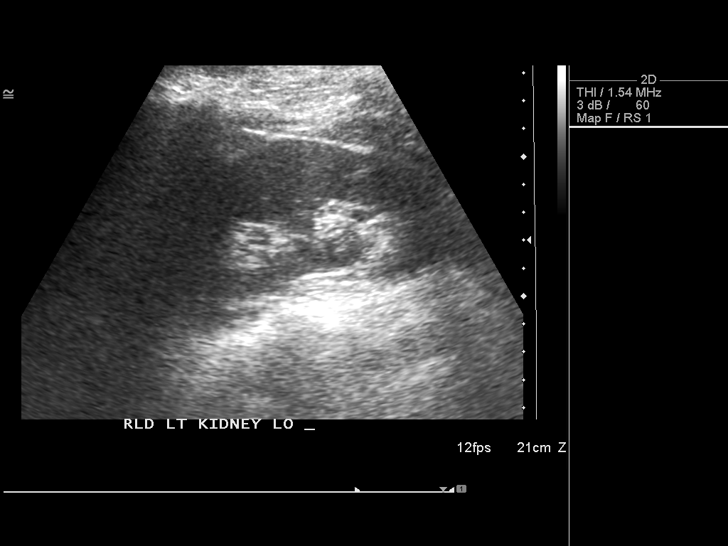
[im 71/95]
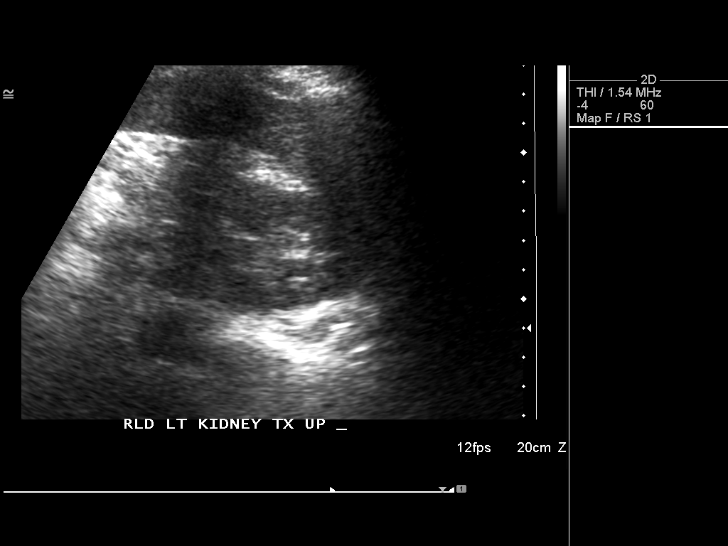
[im 79/95]
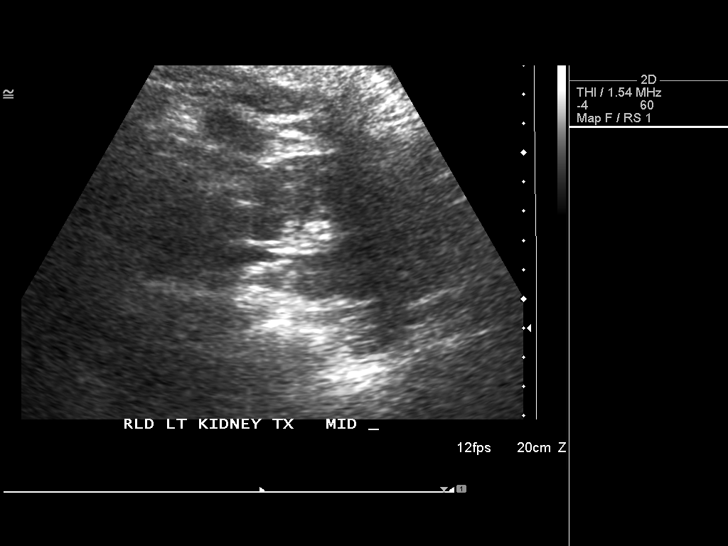
[im 87/95]
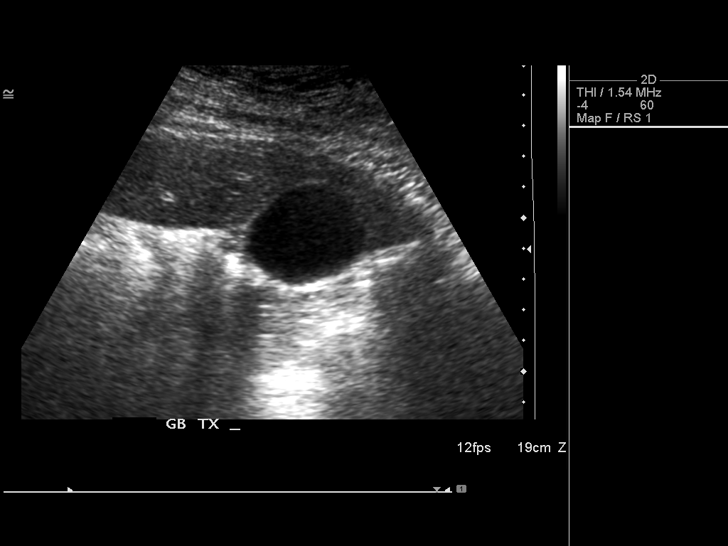
[im 95/95]
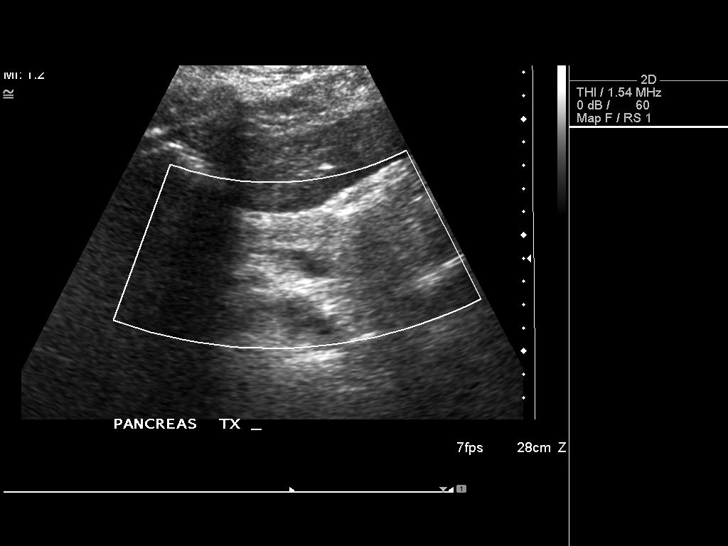

[14 of 25 positions shown; findings below may reference images not displayed]

FINDINGS: Gallbladder:  Normal, without wall thickening, stone, or
pericholecystic fluid.  Sonographic Murphy's sign was not elicited.

Common bile duct: Normal, at 3 mm.

Liver: Normal in echogenicity, without focal lesion.

IVC: Negative

Pancreas:  Negative

Spleen:  Normal in size and echogenicity.

Right Kidney:  10.9 cm. No hydronephrosis.

Left Kidney:  12.1 cm. No hydronephrosis.

Abdominal aorta:  Nonaneurysmal without ascites.  Exam mildly
degraded by patient tenderness.
IMPRESSION: 1.  No acute process in the abdomen.
2. Decreased sensitivity and specificity exam due to technique
related factors, as described above.

## 2010-09-01 NOTE — Telephone Encounter (Signed)
Called home number no answer, called cell and left a message that abx was sent and f/u in 2 weeks

## 2010-09-02 ENCOUNTER — Telehealth: Payer: Self-pay | Admitting: Family Medicine

## 2010-09-02 DIAGNOSIS — R109 Unspecified abdominal pain: Secondary | ICD-10-CM

## 2010-09-02 NOTE — Telephone Encounter (Signed)
Call pt: Jamie Mccarthy is normal. If still having pain then lets refer to GI for further eval. Let me know if pt ok with this.

## 2010-09-04 NOTE — Telephone Encounter (Signed)
Pt notifeid of results. Would like the referral to GI. KJ LPN

## 2010-09-07 ENCOUNTER — Other Ambulatory Visit: Payer: Self-pay | Admitting: Family Medicine

## 2010-09-07 ENCOUNTER — Ambulatory Visit (INDEPENDENT_AMBULATORY_CARE_PROVIDER_SITE_OTHER): Payer: 59 | Admitting: Family Medicine

## 2010-09-07 DIAGNOSIS — D510 Vitamin B12 deficiency anemia due to intrinsic factor deficiency: Secondary | ICD-10-CM

## 2010-09-07 MED ORDER — CYANOCOBALAMIN 1000 MCG/ML IJ SOLN
1000.0000 ug | Freq: Once | INTRAMUSCULAR | Status: AC
  Administered 2010-09-07: 1000 ug via INTRAMUSCULAR

## 2010-09-07 NOTE — Progress Notes (Signed)
  Subjective:    Patient ID: Jamie Mccarthy, female    DOB: [birth year], 35 y.o.   MRN: [id number]  HPI  Here for b12 injection   Review of Systems     Objective:   Physical Exam        Assessment & Plan:

## 2010-09-07 NOTE — Telephone Encounter (Signed)
Pt called and needs ? Refills for phentermine and diflucan.  The phentermine is not seen on the med list. Plan:  [facility] for the pt to call and speak with triage nurse.  Need to specifically identify what pt needs. Jarvis Newcomer, LPN [facility]

## 2010-09-11 ENCOUNTER — Inpatient Hospital Stay (INDEPENDENT_AMBULATORY_CARE_PROVIDER_SITE_OTHER)
Admission: RE | Admit: 2010-09-11 | Discharge: 2010-09-11 | Disposition: A | Discharge status: Home / Self Care | Payer: 59 | Source: Ambulatory Visit | Attending: Emergency Medicine | Admitting: Emergency Medicine

## 2010-09-11 ENCOUNTER — Encounter: Payer: Self-pay | Admitting: Emergency Medicine

## 2010-09-11 ENCOUNTER — Telehealth: Payer: Self-pay | Admitting: Family Medicine

## 2010-09-11 DIAGNOSIS — J189 Pneumonia, unspecified organism: Secondary | ICD-10-CM

## 2010-09-11 DIAGNOSIS — D649 Anemia, unspecified: Secondary | ICD-10-CM

## 2010-09-11 DIAGNOSIS — I10 Essential (primary) hypertension: Secondary | ICD-10-CM | POA: Insufficient documentation

## 2010-09-11 NOTE — Telephone Encounter (Signed)
Patient called adv that she had an appointment last Thursday 09/07/10 and she is still not feeling better.Pt states she was told that if not feeling better to call back she is till having night sweats and req to know if she can still have a few days out from work. Work fax# [phone].Pt's cell [phone]

## 2010-09-12 NOTE — Telephone Encounter (Signed)
Called and left message on vm about work note and scheduled appt with [facility] in [place] for 8/30 @ 10 am with Tommi Rumps, MD. They will mail paper work to pt

## 2010-09-12 NOTE — Telephone Encounter (Signed)
I will make the hematology referrall. I will not give work note. I saw her over a month ago so I can't write her out of work. Her Gi referral was put in on 8/13.  It looks like it hasn't been scheduled. Can you go ahead and schedule that?

## 2010-09-18 NOTE — Telephone Encounter (Signed)
Tried to call the pt again to see what her needs were since a call was not received back from this pt after returning her call on 09-07-2010.  LMOM to call if still had needs and if not will assume pt has been taken care of. Jarvis Newcomer, LPN [facility]

## 2010-09-19 ENCOUNTER — Ambulatory Visit (INDEPENDENT_AMBULATORY_CARE_PROVIDER_SITE_OTHER): Payer: 59 | Admitting: Family Medicine

## 2010-09-19 VITALS — BP 128/92 | HR 88

## 2010-09-19 DIAGNOSIS — E538 Deficiency of other specified B group vitamins: Secondary | ICD-10-CM

## 2010-09-19 MED ORDER — CYANOCOBALAMIN 1000 MCG/ML IJ SOLN
1000.0000 ug | INTRAMUSCULAR | Status: DC
  Administered 2010-09-19 – 2010-10-04 (×2): 1000 ug via INTRAMUSCULAR

## 2010-09-19 NOTE — Progress Notes (Signed)
Here for injection b12

## 2010-09-20 ENCOUNTER — Telehealth (INDEPENDENT_AMBULATORY_CARE_PROVIDER_SITE_OTHER): Payer: Self-pay | Admitting: General Surgery

## 2010-09-20 NOTE — Telephone Encounter (Signed)
Referral for lap band followup

## 2010-09-20 NOTE — Telephone Encounter (Signed)
Left message with appt date and time with Dr Biagio Quint on patient's machine. Mailed an appt card with all information as well as [facility] forms. Also mailed information on lap band adjustment charges. Advised the patient that, per [facility], lap band adj are not covered under her policy, therefore, she will be responsible for the adj charge of $350.00.Marland KitchenMarland KitchenMarland Kitchencef

## 2010-09-22 ENCOUNTER — Encounter: Payer: Self-pay | Admitting: Family Medicine

## 2010-10-04 ENCOUNTER — Ambulatory Visit (INDEPENDENT_AMBULATORY_CARE_PROVIDER_SITE_OTHER): Payer: 59 | Admitting: Family Medicine

## 2010-10-04 ENCOUNTER — Ambulatory Visit: Payer: 59

## 2010-10-04 ENCOUNTER — Encounter: Payer: Self-pay | Admitting: Family Medicine

## 2010-10-04 VITALS — BP 137/97 | HR 82 | Wt 232.0 lb

## 2010-10-04 DIAGNOSIS — R5381 Other malaise: Secondary | ICD-10-CM

## 2010-10-04 DIAGNOSIS — Z23 Encounter for immunization: Secondary | ICD-10-CM

## 2010-10-04 DIAGNOSIS — R5383 Other fatigue: Secondary | ICD-10-CM

## 2010-10-04 DIAGNOSIS — R6882 Decreased libido: Secondary | ICD-10-CM

## 2010-10-04 DIAGNOSIS — E538 Deficiency of other specified B group vitamins: Secondary | ICD-10-CM

## 2010-10-04 DIAGNOSIS — Z9884 Bariatric surgery status: Secondary | ICD-10-CM

## 2010-10-04 DIAGNOSIS — J189 Pneumonia, unspecified organism: Secondary | ICD-10-CM

## 2010-10-04 LAB — CBC
HCT: 30.7 % — ABNORMAL LOW (ref 36.0–46.0)
Hemoglobin: 8.8 g/dL — ABNORMAL LOW (ref 12.0–15.0)
MCH: 17.3 pg — ABNORMAL LOW (ref 26.0–34.0)
MCHC: 28.7 g/dL — ABNORMAL LOW (ref 30.0–36.0)

## 2010-10-04 LAB — COMPLETE METABOLIC PANEL WITH GFR
CO2: 19 mEq/L (ref 19–32)
Creat: 0.7 mg/dL (ref 0.50–1.10)
GFR, Est African American: >60 mL/min (ref >60)
GFR, Est Non African American: >60 mL/min (ref >60)
Glucose, Bld: 129 mg/dL — ABNORMAL HIGH (ref 70–99)
Sodium: 140 mEq/L (ref 135–145)
Total Bilirubin: 0.4 mg/dL (ref 0.3–1.2)
Total Protein: 6.9 g/dL (ref 6.0–8.3)

## 2010-10-04 MED ORDER — BUPROPION HCL ER (XL) 150 MG PO TB24: 150.0000 mg | ORAL_TABLET | ORAL | Status: DC

## 2010-10-04 NOTE — Progress Notes (Signed)
  Subjective:    Patient ID: Jamie Mccarthy, female    DOB: 1975-08-07, 35 y.o.   MRN: [id number]  HPI  Here to follow up pneumonia.  She feels she has fully recovered. Dong well.  Completed her ABX. Here for b12 shot too.    Has nasal congestion, drainage and cough for about 2 daysl  No SOB.  No fever.  Kids are sick. No worseign or alleviating sxs.    Also decreased libido since having her twins. Hasn't had sex in months and starting to affect her relationship with her husband. She feels she might have some mild depression as well. She is no longer nusing. Doesn't have time to work out.   She is concerned bc now feels she is putting on weight.    C/O fatigue and hot flashes at night. Still having normal periods thought her last peroid was longer than usual. Her mom was recently dx with thyroid cancer and his having surgery next week.   Review of Systems     Objective:   Physical Exam  Constitutional: She is oriented to person, place, and time. She appears well-developed and well-nourished.  HENT:  Head: Normocephalic and atraumatic.  Right Ear: External ear normal.  Left Ear: External ear normal.  Nose: Nose normal.  Mouth/Throat: Oropharynx is clear and moist.       TMs and canals are clear.   Eyes: Conjunctivae and EOM are normal. Pupils are equal, round, and reactive to light.  Neck: Neck supple. No thyromegaly present.  Cardiovascular: Normal rate, regular rhythm and normal heart sounds.   Pulmonary/Chest: Effort normal and breath sounds normal. She has no wheezes.  Lymphadenopathy:    She has no cervical adenopathy.  Neurological: She is alert and oriented to person, place, and time.  Skin: Skin is warm and dry.  Psychiatric: She has a normal mood and affect.          Assessment & Plan:  Flu vaccine given  Dec libido - Discussed can be normal after having a baby.  Will start wellbutrin to help with her libido and mild depressive sxs.   B12 def- injection given  today.  Fatigue - Will check TSH and cbc, vit D.    URI - Symptomatic care. Call if not better in 10 days.   PNA - Resolved.

## 2010-10-05 ENCOUNTER — Encounter (INDEPENDENT_AMBULATORY_CARE_PROVIDER_SITE_OTHER): Payer: 59 | Admitting: General Surgery

## 2010-10-06 ENCOUNTER — Telehealth: Payer: Self-pay | Admitting: Family Medicine

## 2010-10-06 NOTE — Telephone Encounter (Signed)
Pt called to obtain recent lab results. Plan:  Told pt it looks like the provider is still reviewing the lab results and someone should call her once the provider has officially completed her notes/orders/recommendations. Jamie Newcomer, LPN [facility]

## 2010-10-09 ENCOUNTER — Telehealth: Payer: Self-pay | Admitting: *Deleted

## 2010-10-09 NOTE — Telephone Encounter (Signed)
Pt notified. KJ LPN 

## 2010-10-09 NOTE — Telephone Encounter (Signed)
Message copied by Lanae Crumbly on Mon Oct 09, 2010  9:27 AM ------      Message from: Nani Gasser D      Created: Fri Oct 06, 2010  7:46 AM       Vitamin D is low. Major taking 2000 international units daily of vitamin D. This can certainly cause muscle aches and joint pain. Thyroid and liver and kidneys are normal. Electrolytes are normal. Hemoglobin is still low at 8.8 there is little bit better than it was a month ago to 8.5. Make sure continuing to eat iron rich foods. Her taking an iron supplement if able to. This will certainly cause low energy and fatigue and make it difficult to exercise. Let's recheck iron in 2 months. Her

## 2010-10-09 NOTE — Telephone Encounter (Signed)
Pt notifeid of results and MD instructions. KJ LPN 

## 2010-10-09 NOTE — Telephone Encounter (Signed)
Message copied by Lanae Crumbly on Mon Oct 09, 2010  9:00 AM ------      Message from: Nani Gasser D      Created: Fri Oct 06, 2010  7:46 AM       Vitamin D is low. Major taking 2000 international units daily of vitamin D. This can certainly cause muscle aches and joint pain. Thyroid and liver and kidneys are normal. Electrolytes are normal. Hemoglobin is still low at 8.8 there is little bit better than it was a month ago to 8.5. Make sure continuing to eat iron rich foods. Her taking an iron supplement if able to. This will certainly cause low energy and fatigue and make it difficult to exercise. Let's recheck iron in 2 months. Her

## 2010-10-09 NOTE — Telephone Encounter (Signed)
LMOM for pt to return call for results. KJ LPN 

## 2010-10-26 ENCOUNTER — Encounter (INDEPENDENT_AMBULATORY_CARE_PROVIDER_SITE_OTHER): Payer: Self-pay

## 2010-10-26 ENCOUNTER — Encounter (INDEPENDENT_AMBULATORY_CARE_PROVIDER_SITE_OTHER): Payer: 59 | Admitting: General Surgery

## 2010-11-03 ENCOUNTER — Ambulatory Visit: Payer: 59

## 2010-11-07 ENCOUNTER — Ambulatory Visit: Payer: 59

## 2010-11-15 ENCOUNTER — Telehealth: Payer: Self-pay | Admitting: Family Medicine

## 2010-11-15 NOTE — Telephone Encounter (Signed)
Pt called and said she thinks she has a sinus infection.  Wants to know if we can call a script for this or if she needs an appt. Plan:  Returned the pt call but the voice message said voice mail full with messages and can't take anymore messages.  Pt can call back at her convenience but she will need an appt since we don't call in antibiotics without a pt appt. Jarvis Newcomer, LPN [facility]

## 2010-11-16 ENCOUNTER — Encounter (INDEPENDENT_AMBULATORY_CARE_PROVIDER_SITE_OTHER): Payer: 59 | Admitting: General Surgery

## 2010-11-16 ENCOUNTER — Telehealth (INDEPENDENT_AMBULATORY_CARE_PROVIDER_SITE_OTHER): Payer: Self-pay

## 2010-11-16 ENCOUNTER — Encounter (INDEPENDENT_AMBULATORY_CARE_PROVIDER_SITE_OTHER): Payer: Self-pay

## 2010-11-16 NOTE — Telephone Encounter (Signed)
Called patient and left message to call our office.  Patient need's to have a Swallow Study before she can have a Lap Band Adjustment per Dr. Biagio Quint.

## 2010-11-23 ENCOUNTER — Encounter (INDEPENDENT_AMBULATORY_CARE_PROVIDER_SITE_OTHER): Payer: Self-pay | Admitting: General Surgery

## 2010-12-04 ENCOUNTER — Ambulatory Visit: Payer: 59 | Admitting: Family Medicine

## 2010-12-20 ENCOUNTER — Ambulatory Visit (INDEPENDENT_AMBULATORY_CARE_PROVIDER_SITE_OTHER): Payer: 59 | Admitting: Family Medicine

## 2010-12-20 ENCOUNTER — Encounter: Payer: Self-pay | Admitting: Family Medicine

## 2010-12-20 VITALS — BP 138/85 | HR 112 | Temp 98.4°F | Wt 232.0 lb

## 2010-12-20 DIAGNOSIS — Z9884 Bariatric surgery status: Secondary | ICD-10-CM

## 2010-12-20 DIAGNOSIS — N921 Excessive and frequent menstruation with irregular cycle: Secondary | ICD-10-CM

## 2010-12-20 DIAGNOSIS — J329 Chronic sinusitis, unspecified: Secondary | ICD-10-CM

## 2010-12-20 MED ORDER — FLUCONAZOLE 150 MG PO TABS: 150.0000 mg | ORAL_TABLET | Freq: Once | ORAL | Status: DC

## 2010-12-20 MED ORDER — PHENTERMINE HCL 37.5 MG PO CAPS: 37.5000 mg | ORAL_CAPSULE | ORAL | Status: DC

## 2010-12-20 MED ORDER — SULFAMETHOXAZOLE-TRIMETHOPRIM 800-160 MG PO TABS: 1.0000 | ORAL_TABLET | Freq: Two times a day (BID) | ORAL | Status: AC

## 2010-12-20 MED ORDER — NORGESTIMATE-ETH ESTRADIOL 0.25-35 MG-MCG PO TABS: 1.0000 | ORAL_TABLET | Freq: Every day | ORAL | Status: DC

## 2010-12-20 MED ORDER — MEDROXYPROGESTERONE ACETATE 10 MG PO TABS: 10.0000 mg | ORAL_TABLET | Freq: Every day | ORAL | Status: DC

## 2010-12-20 NOTE — Progress Notes (Signed)
  Subjective:    Patient ID: Jamie Mccarthy, female    DOB: 01/12/1976, 35 y.o.   MRN: [id number]  HPI Sinus sxs and coughing for 2 months. Positive nasal congestion and sinus pressure. Occasional headaches.  Tried some OtC antihistamine meds and didn't help at all. Now she is starting get tightness in her chest.  She had she's not getting an early pneumonia. She's had this before. No worsening or alleviating symptoms. She has been sneezing a lot as well.  Has been bleeding since Oct 24th, brown color. Now periods has been bright red.  She is using the nuvaring. She is on the week where she is off the NuvaRing and is supposed to reinsert next week. She's been on the NuvaRing since July. She had twins 9 months ago.  Status post LAP-BAND-she originally had her surgery at [facility]. She's like to find a local provider as her surgeon no longer works at [employer]. She is interested in being seen at [facility]. She has had multiple problems with her LAP-BAND. She says it's still uncomfortable she picks up her children and they lay against her abdomen. She wonders if she could have internal bleeding. Review of Systems     Objective:   Physical Exam  Constitutional: She is oriented to person, place, and time. She appears well-developed and well-nourished.  HENT:  Head: Normocephalic and atraumatic.  Right Ear: External ear normal.  Left Ear: External ear normal.  Nose: Nose normal.  Mouth/Throat: Oropharynx is clear and moist.       TMs and canals are clear.   Eyes: Conjunctivae and EOM are normal. Pupils are equal, round, and reactive to light.  Neck: Neck supple. No thyromegaly present.  Cardiovascular: Normal rate, regular rhythm and normal heart sounds.   Pulmonary/Chest: Effort normal and breath sounds normal. She has no wheezes.  Lymphadenopathy:    She has no cervical adenopathy.  Neurological: She is alert and oriented to person, place, and time.  Skin: Skin is warm and dry.  Psychiatric: She  has a normal mood and affect.          Assessment & Plan:  Sinusitis- exam is normal today and she has already tried an over-the-counter antihistamine. This makes allergies less likely. I will have him treat her for a sinus infection with Bactrim. She said she took a Z-Pak not too long ago. If she's not better in 7-10 days then please call the office.  Metrorrhagia - we discussed different options. At this point in time we'll go ahead and start Provera to try to stop her period. That, I let her know that when she stops the medication she will start to bleed again. She would like to change her birth control by mouth instead of the NuvaRing. I sent a prescription for Sprintec for her to start. I also encouraged her to think about getting an IUD inserted again. She has had one previously and did well with that. She can speak with her GYN about placement.  Lap Band status-I tried to reassure her that it would be extremely unlikely for her had internal bleeding and low she was recently postop which is not. She had her surgery years ago. We will try to get her scheduled at [facility] bariatric clinic. I discussed with her that she might need to talk with him about actually having it removed she has had so many problems and also has recurrent vomiting.

## 2010-12-20 NOTE — Patient Instructions (Addendum)
Can take the provera once a day. Once finished can start the new birth control pill.  Call me if not better in one week.

## 2010-12-25 NOTE — Progress Notes (Signed)
Summary: pneumonia rm 4   Vital Signs:  Patient Profile:   35 Years Old Female CC:      pneumonia, tightness in chest Height:     66 inches Weight:      226.50 pounds O2 Sat:      99 % O2 treatment:    Room Air Temp:     98.6 degrees F oral Pulse rate:   88 / minute Resp:     18 per minute BP sitting:   138 / 84  (left arm) Cuff size:   large  Vitals Entered By: Clemens Catholic LPN (September 11, 2010 8:29 AM)                  Updated Prior Medication List: ZITHROMAX 1 GM PACK (AZITHROMYCIN)   Current Allergies (reviewed today): No known allergies History of Present Illness History from: patient Chief Complaint: pneumonia, tightness in chest History of Present Illness: Pt complains of 14 days of chest congestion.  Had R chest pain/RUQ pain 2 weeks ago, Dr Eppie Gibson ordered US abdomen. Per pt, [place] was neg. CXR showed right upper lobe pneumonia, and Dr. Eppie Gibson Rx'd Z-Pak.  Pt admits they she, herself, delayed getting this filled and did not start taking the first dose until last night. No sore throat. + cough, occ productive. + mild exertional dyspnea. No exertional chest pain. Rare pleuritic R chest discomfort. No radiation. No numbness. No wheezing.  Occasional nausea, but appetite ok. No vomiting. + fever, + chills.  +Fatigue. PMH: neg for chronic lung dz or prior pneumonia. Pt denies chance pregnancy.  REVIEW OF SYSTEMS Constitutional Symptoms       Complains of night sweats.     Denies fever, chills, weight loss, weight gain, and fatigue.  Eyes       Denies change in vision, eye pain, eye discharge, glasses, contact lenses, and eye surgery. Ear/Nose/Throat/Mouth       Complains of sinus problems.      Denies hearing loss/aids, change in hearing, ear pain, ear discharge, dizziness, frequent runny nose, frequent nose bleeds, sore throat, hoarseness, and tooth pain or bleeding.  Respiratory       Complains of shortness of breath.      Denies dry cough, productive  cough, wheezing, asthma, bronchitis, and emphysema/COPD.  Cardiovascular       Complains of chest pain and tires easily with exhertion.      Denies murmurs.    Gastrointestinal       Denies stomach pain, nausea/vomiting, diarrhea, constipation, blood in bowel movements, and indigestion. Genitourniary       Denies painful urination, kidney stones, and loss of urinary control. Neurological       Complains of headaches.      Denies paralysis, seizures, and fainting/blackouts. Musculoskeletal       Denies muscle pain, joint pain, joint stiffness, decreased range of motion, redness, swelling, muscle weakness, and gout.  Skin       Denies bruising, unusual mles/lumps or sores, and hair/skin or nail changes.  Psych       Denies mood changes, temper/anger issues, anxiety/stress, speech problems, depression, and sleep problems. Other Comments: pt was seen by her PCP, Dr Linford Arnold and dx with pneumonia on 08/31/10.she did not get results of xray until 09/07/10 due to an issue with her telephone. she started the ABT on 8/19.  she now c/o tightness in chest, fatigue, HA, night sweats, drainage and sneezing. no fever, no OTC meds.   Past History:  Past Medical History: G0 P0 Hypertension- with pregnancy  Past Surgical History: Lap band `05 Caesarean section  Family History: Father`s family - DM  Maternal GM - BrCa  Mother - HTN, thyroid CA  Social History: Separated.  Never smoked.   No drugs.  Sexually active.  2 caffeinated drinks per day Alcohol use-no Physical Exam General appearance: well developed, well nourished,very fatigued, no acute distress Head: normocephalic, atraumatic Eyes: conjunctivae and lids normal. No icterus. Pupils: equal, round, reactive to light Ears: normal, no lesions or deformities Nasal: swollen red turbinates with congestion Oral/Pharynx: tongue normal, posterior pharynx without erythema or exudate Neck: neck supple,  trachea midline, no masses Chest/Lungs:  scattered rhonchi. No definate rales or wheezes. Breath sounds = bilat. No rubs. Heart: regular rate and  rhythm, no murmur Abdomen: soft, non-tender without obvious organomegaly Extremities: normal extremities. No calf ttp or heat Neurological: grossly intact and non-focal Skin: no obvious rashes or lesions MSE: oriented to time, place, and person Assessment New Problems: PNEUMONIA, ORGANISM UNSPECIFIED (ICD-486) HYPERTENSION (ICD-401.9)  This is both a clinical dx and based on recent CXR ordered by Dr Linford Arnold.   Patient Education: Patient and/or caregiver instructed in the following: rest fluids and Tylenol.  Plan New Medications/Changes: DIFLUCAN 200 MG TABS (FLUCONAZOLE) 1 by mouth stat , then 1 by mouth in 4 days, if needed for yeast infection  #2 x 0, 09/11/2010, Lajean Manes MD  New Orders: Rocephin  250mg  [Z6109] Admin of Therapeutic Inj  intramuscular or subcutaneous [96372] New Patient Level III [99203] Planning Comments:   Explained to pt that she just started Zithromax last night, and advised her to continue and complete that as predscibed by Dr Linford Arnold. Rocephin 1 gram IM stat. Also rx'd Diflucan at her request as needed abx induced yeast infxn. We discussed other otc ancillary tx's.  Risks, benefits, alternatives discussed. Pt voiced understanding and agreement.  Follow Up: 1 week Dr Eppie Gibson, who can decide when to repeat CXR. Return to work/school on 09/14/2010 Work Excuse: given  The patient and/or caregiver has been counseled thoroughly with regard to medications prescribed including dosage, schedule, interactions, rationale for use, and possible side effects and they verbalize understanding.  Diagnoses and expected course of recovery discussed and will return if not improved as expected or if the condition worsens. Patient and/or caregiver verbalized understanding.  Prescriptions: DIFLUCAN 200 MG TABS (FLUCONAZOLE) 1 by mouth stat , then 1 by mouth in 4 days, if  needed for yeast infection  #2 x 0   Entered and Authorized by:   Lajean Manes MD   Signed by:   Lajean Manes MD on 09/11/2010   Method used:   Electronically to        [facility] (retail)       [address]       [place], [place]  [address]       Ph: [phone]       Fax: [phone]   RxID:   [id number]   Medication Administration  Injection # 1:    Medication: Rocephin  250mg     Diagnosis: PNEUMONIA, ORGANISM UNSPECIFIED (ICD-486)    Route: IM    Site: LUOQ gluteus    Exp Date: 08/22/2012    Lot #: [id number]    Mfr: [email or web address]    Comments: 1 gram     Patient tolerated injection without complications    Given by: Clemens Catholic LPN (September 11, 2010 9:32 AM)  Orders Added: 1)  Rocephin  250mg  [J0696] 2)  Admin of Therapeutic Inj  intramuscular or subcutaneous [96372] 3)  New Patient Level III [[address]]

## 2010-12-25 NOTE — Letter (Signed)
Summary: Out of Work  [facility]  [address]   [place], [place] [address]   Phone: [phone]  Fax: [phone]    September 11, 2010   Employee:  [employer]    To Whom It May Concern:   For Medical reasons, please excuse the above named employee from work for the following dates:  Start:   09/11/10  End:   09/14/10  If you need additional information, please feel free to contact our office.         Sincerely,    Lajean Manes MD

## 2010-12-29 ENCOUNTER — Other Ambulatory Visit: Payer: Self-pay | Admitting: *Deleted

## 2010-12-29 MED ORDER — FLUCONAZOLE 150 MG PO TABS: 150.0000 mg | ORAL_TABLET | Freq: Once | ORAL | Status: AC

## 2010-12-29 NOTE — Telephone Encounter (Signed)
Pt aware.

## 2010-12-29 NOTE — Telephone Encounter (Signed)
OK rx sent. If not resolving then may need to come in for eval.

## 2010-12-29 NOTE — Telephone Encounter (Signed)
Pt states the Diflucan you gave her last week is not working still irritated and irtching. Also is still taking the antibiotic you gave her. Can she get some more Diflucan. [facility] in [place] on [address]

## 2011-01-19 ENCOUNTER — Ambulatory Visit (INDEPENDENT_AMBULATORY_CARE_PROVIDER_SITE_OTHER): Payer: 59 | Admitting: Family Medicine

## 2011-01-19 DIAGNOSIS — E669 Obesity, unspecified: Secondary | ICD-10-CM

## 2011-01-19 MED ORDER — PHENTERMINE HCL 37.5 MG PO CAPS: 37.5000 mg | ORAL_CAPSULE | ORAL | Status: DC

## 2011-01-19 NOTE — Progress Notes (Signed)
Patient ID: Jamie Mccarthy, female   DOB: 1975/12/15, 35 y.o.   MRN: [id number]   Pt denies chest pain, SOB, dizziness, or heart palpitations.  Taking meds as directed w/o problems.  Denies medication side effects.  5 min spent with pt. Patient doing well on appetite suppressant.  Here for nurse visit, weight, BP, HR check.  Denies problems with insomnia, heart palpitations or tremors.  Satisfied with weight loss thus far and is working on [employer] and regular exercise.     Obesity-she is doing great with her weight loss. Her blood pressure is well-controlled. We refilled her phentermine today. Followup in one month for repeat blood pressure recheck.

## 2011-04-02 ENCOUNTER — Encounter: Payer: Self-pay | Admitting: Family Medicine

## 2011-04-02 ENCOUNTER — Ambulatory Visit (INDEPENDENT_AMBULATORY_CARE_PROVIDER_SITE_OTHER): Payer: 59 | Admitting: Family Medicine

## 2011-04-02 DIAGNOSIS — J329 Chronic sinusitis, unspecified: Secondary | ICD-10-CM

## 2011-04-02 MED ORDER — AZITHROMYCIN 250 MG PO TABS: ORAL_TABLET | ORAL | Status: AC

## 2011-04-02 MED ORDER — FLUCONAZOLE 150 MG PO TABS: 150.0000 mg | ORAL_TABLET | Freq: Once | ORAL | Status: AC

## 2011-04-02 NOTE — Progress Notes (Signed)
  Subjective:    Patient ID: Jamie Mccarthy, female    DOB: 23-May-1975, 36 y.o.   MRN: [id number]  HPI 1 week of sinus congestion and drianage. Mild cough. + HA. No fever.  No facial pain.  No ear pain. No decongestant.  STart the nasal saline spray.  No improvement. Mild ST and hoarseness.     Review of Systems     Objective:   Physical Exam  Constitutional: She is oriented to person, place, and time. She appears well-developed and well-nourished.  HENT:  Head: Normocephalic and atraumatic.  Right Ear: External ear normal.  Left Ear: External ear normal.  Nose: Nose normal.  Mouth/Throat: Oropharynx is clear and moist.       TMs and canals are clear.   Eyes: Conjunctivae and EOM are normal. Pupils are equal, round, and reactive to light.  Neck: Neck supple. No thyromegaly present.  Cardiovascular: Normal rate, regular rhythm and normal heart sounds.   Pulmonary/Chest: Effort normal and breath sounds normal. She has no wheezes.  Lymphadenopathy:    She has no cervical adenopathy.  Neurological: She is alert and oriented to person, place, and time.  Skin: Skin is warm and dry.  Psychiatric: She has a normal mood and affect.          Assessment & Plan:  Sinusitis - Will tx with zpack. Asked her to give it a couple of more days before filling. She alslo needs diflucan if fills med. Sent rx. ccontinue symptomatic care. Call if not better in one week or certainly gets worse.  Blood pressure is elevated today-normally she runs in the 130s and has for the last 3 office visits. We will keep an eye on this. Not sure why to that today. She says she has been avoiding decongestants.

## 2011-04-02 NOTE — Patient Instructions (Signed)
Sinusitis Sinuses are air pockets within the bones of your face. The growth of bacteria within a sinus leads to infection. The infection prevents the sinuses from draining. This infection is called sinusitis. SYMPTOMS  There will be different areas of pain depending on which sinuses have become infected.  The maxillary sinuses often produce pain beneath the eyes.   Frontal sinusitis may cause pain in the middle of the forehead and above the eyes.  Other problems (symptoms) include:  Toothaches.   Colored, pus-like (purulent) drainage from the nose.   Swelling, warmth, and tenderness over the sinus areas may be signs of infection.  TREATMENT  Sinusitis is most often determined by an exam.X-rays may be taken. If x-rays have been taken, make sure you obtain your results or find out how you are to obtain them. Your caregiver may give you medications (antibiotics). These are medications that will help kill the bacteria causing the infection. You may also be given a medication (decongestant) that helps to reduce sinus swelling.  HOME CARE INSTRUCTIONS   Only take over-the-counter or prescription medicines for pain, discomfort, or fever as directed by your caregiver.   Drink extra fluids. Fluids help thin the mucus so your sinuses can drain more easily.   Applying either moist heat or ice packs to the sinus areas may help relieve discomfort.   Use saline nasal sprays to help moisten your sinuses. The sprays can be found at your local drugstore.  SEEK IMMEDIATE MEDICAL CARE IF:  You have a fever.   You have increasing pain, severe headaches, or toothache.   You have nausea, vomiting, or drowsiness.   You develop unusual swelling around the face or trouble seeing.  MAKE SURE YOU:   Understand these instructions.   Will watch your condition.   Will get help right away if you are not doing well or get worse.  Document Released: 01/08/2005 Document Revised: 12/28/2010 Document Reviewed:  08/07/2006 ExitCare Patient Information 2012 [employer].Sinusitis Sinuses are air pockets within the bones of your face. The growth of bacteria within a sinus leads to infection. The infection prevents the sinuses from draining. This infection is called sinusitis. SYMPTOMS  There will be different areas of pain depending on which sinuses have become infected.  The maxillary sinuses often produce pain beneath the eyes.   Frontal sinusitis may cause pain in the middle of the forehead and above the eyes.  Other problems (symptoms) include:  Toothaches.   Colored, pus-like (purulent) drainage from the nose.   Swelling, warmth, and tenderness over the sinus areas may be signs of infection.  TREATMENT  Sinusitis is most often determined by an exam.X-rays may be taken. If x-rays have been taken, make sure you obtain your results or find out how you are to obtain them. Your caregiver may give you medications (antibiotics). These are medications that will help kill the bacteria causing the infection. You may also be given a medication (decongestant) that helps to reduce sinus swelling.  HOME CARE INSTRUCTIONS   Only take over-the-counter or prescription medicines for pain, discomfort, or fever as directed by your caregiver.   Drink extra fluids. Fluids help thin the mucus so your sinuses can drain more easily.   Applying either moist heat or ice packs to the sinus areas may help relieve discomfort.   Use saline nasal sprays to help moisten your sinuses. The sprays can be found at your local drugstore.  SEEK IMMEDIATE MEDICAL CARE IF:  You have a fever.     You have increasing pain, severe headaches, or toothache.   You have nausea, vomiting, or drowsiness.   You develop unusual swelling around the face or trouble seeing.  MAKE SURE YOU:   Understand these instructions.   Will watch your condition.   Will get help right away if you are not doing well or get worse.  Document  Released: 01/08/2005 Document Revised: 12/28/2010 Document Reviewed: 08/07/2006 ExitCare Patient Information 2012 [employer]. 

## 2011-04-09 ENCOUNTER — Ambulatory Visit: Payer: 59 | Admitting: Family Medicine

## 2011-05-07 ENCOUNTER — Inpatient Hospital Stay: Admit: 2011-05-07 | Discharge: 2011-05-07 | Disposition: A | Discharge status: Home / Self Care | Payer: 59

## 2011-05-07 ENCOUNTER — Ambulatory Visit (INDEPENDENT_AMBULATORY_CARE_PROVIDER_SITE_OTHER): Payer: 59 | Admitting: Physician Assistant

## 2011-05-07 ENCOUNTER — Encounter: Payer: Self-pay | Admitting: Physician Assistant

## 2011-05-07 DIAGNOSIS — R1011 Right upper quadrant pain: Secondary | ICD-10-CM

## 2011-05-07 DIAGNOSIS — R112 Nausea with vomiting, unspecified: Secondary | ICD-10-CM

## 2011-05-07 DIAGNOSIS — I10 Essential (primary) hypertension: Secondary | ICD-10-CM

## 2011-05-07 LAB — POCT URINE PREGNANCY: Preg Test, Ur: NEGATIVE

## 2011-05-07 LAB — COMPREHENSIVE METABOLIC PANEL
ALT: 10 U/L (ref 0–35)
CO2: 26 mEq/L (ref 19–32)
Chloride: 105 mEq/L (ref 96–112)
Potassium: 4.3 mEq/L (ref 3.5–5.3)
Sodium: 139 mEq/L (ref 135–145)
Total Bilirubin: 0.5 mg/dL (ref 0.3–1.2)
Total Protein: 7.2 g/dL (ref 6.0–8.3)

## 2011-05-07 LAB — CBC WITH DIFFERENTIAL/PLATELET
Basophils Absolute: 0 10*3/uL (ref 0.0–0.1)
Eosinophils Absolute: 0.4 10*3/uL (ref 0.0–0.7)
Eosinophils Relative: 3 % (ref 0–5)
Lymphocytes Relative: 29 % (ref 12–46)
MCV: 62 fL — ABNORMAL LOW (ref 78.0–100.0)
Platelets: 472 10*3/uL — ABNORMAL HIGH (ref 150–400)
RDW: 17.6 % — ABNORMAL HIGH (ref 11.5–15.5)
WBC: 11.9 10*3/uL — ABNORMAL HIGH (ref 4.0–10.5)

## 2011-05-07 IMAGING — US US ABDOMEN LIMITED
1 series · 13 of 25 positions shown · non-contrast
Comparison: [DATE]

CLINICAL DATA: Nausea and vomiting for 5 days, right upper
quadrant pain with palpation, question cholelithiasis; history of
laparoscopic banding [AU]

LIMITED ABDOMINAL ULTRASOUND - RIGHT UPPER QUADRANT

[Series 1: us abdomen limited · 0.25mm/px · 13 of 47 slices shown]
[im 1/47]
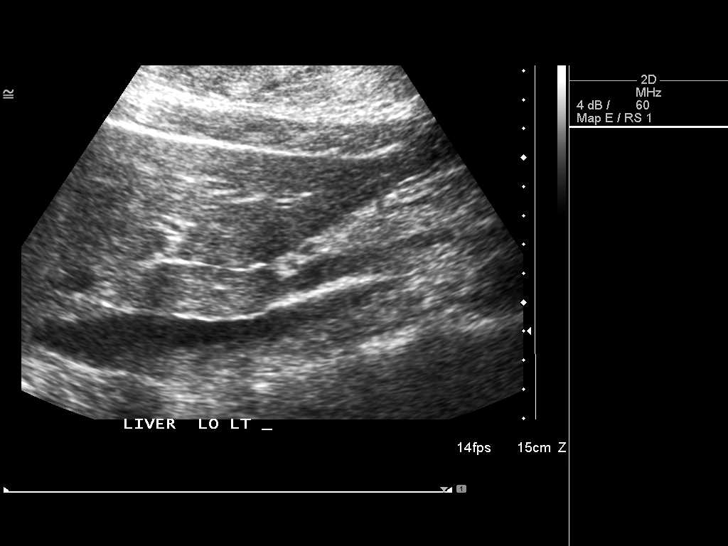
[im 4/47]
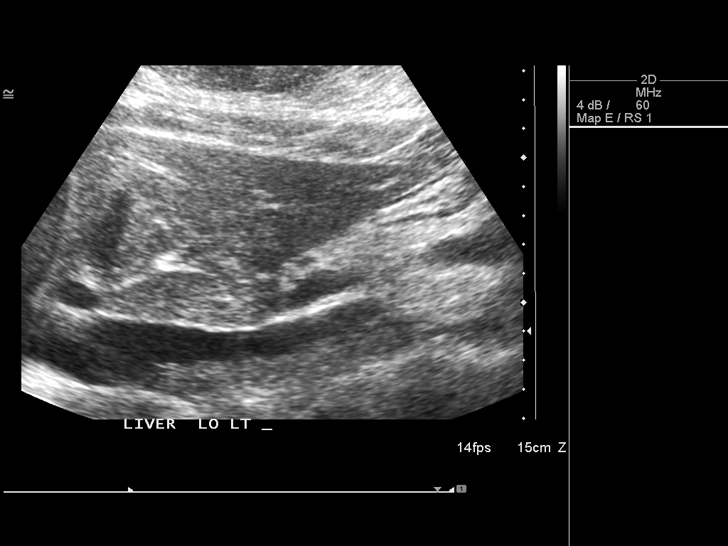
[im 8/47]
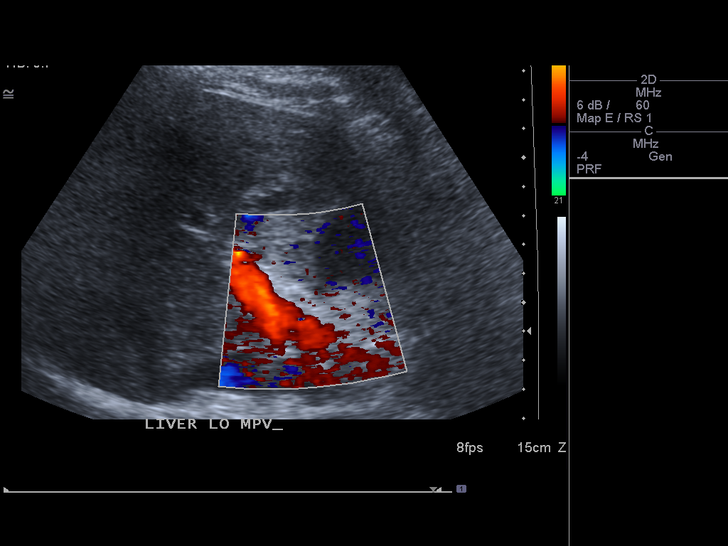
[im 12/47]
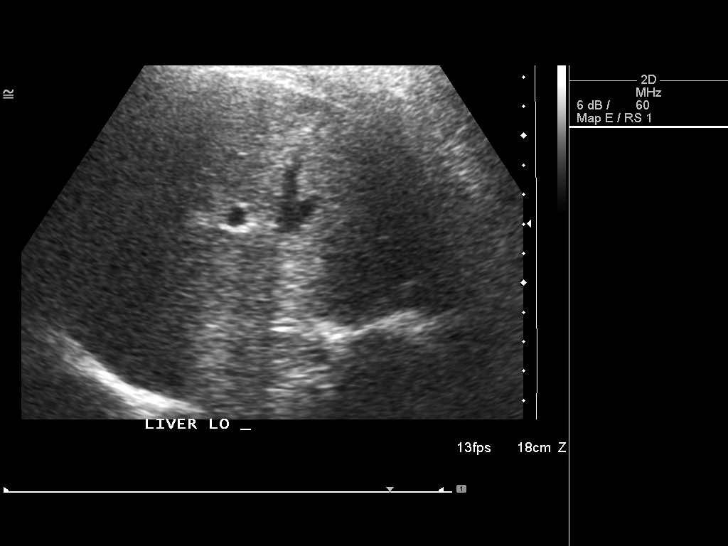
[im 16/47]
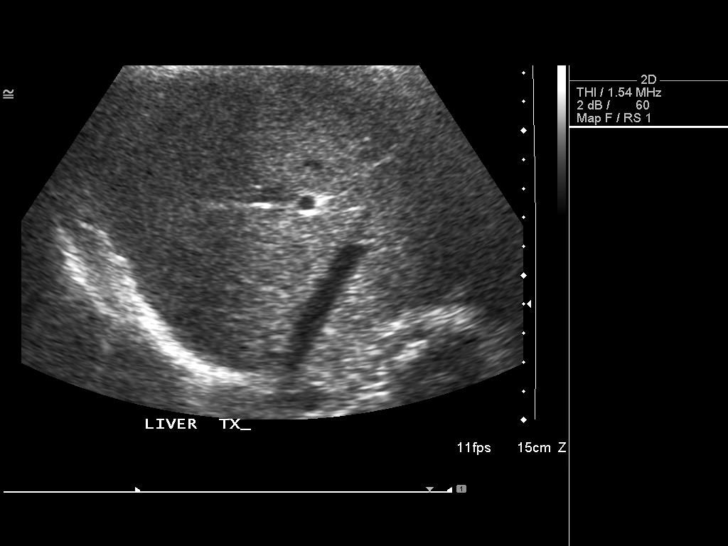
[im 20/47]
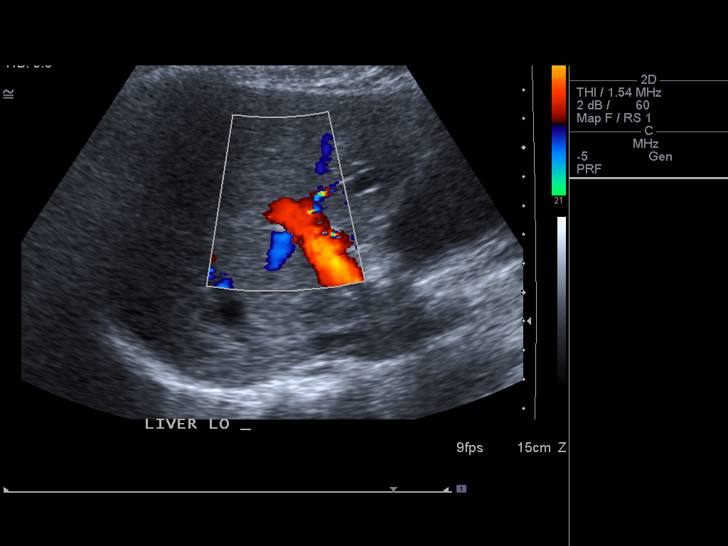
[im 24/47]
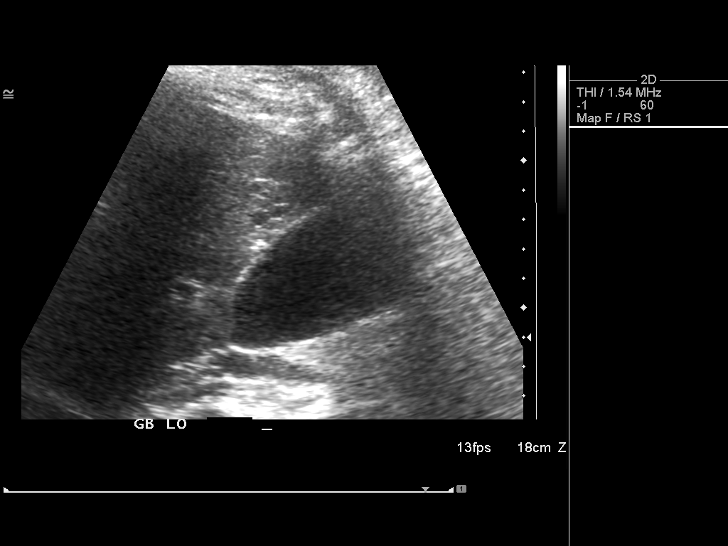
[im 27/47]
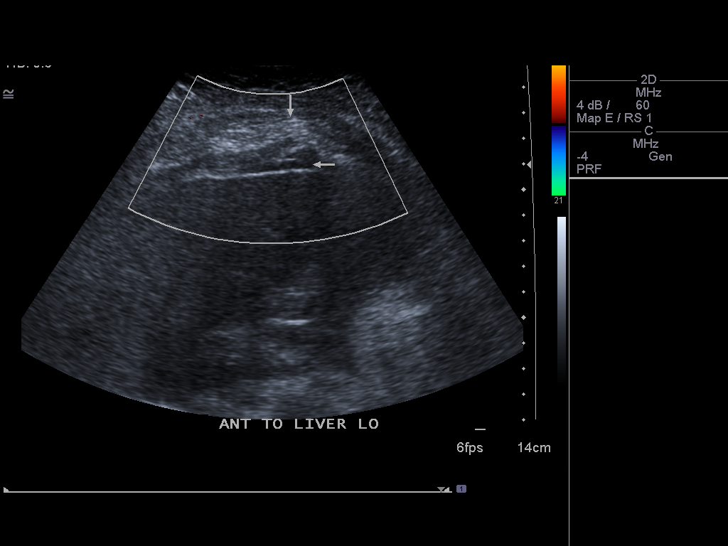
[im 31/47]
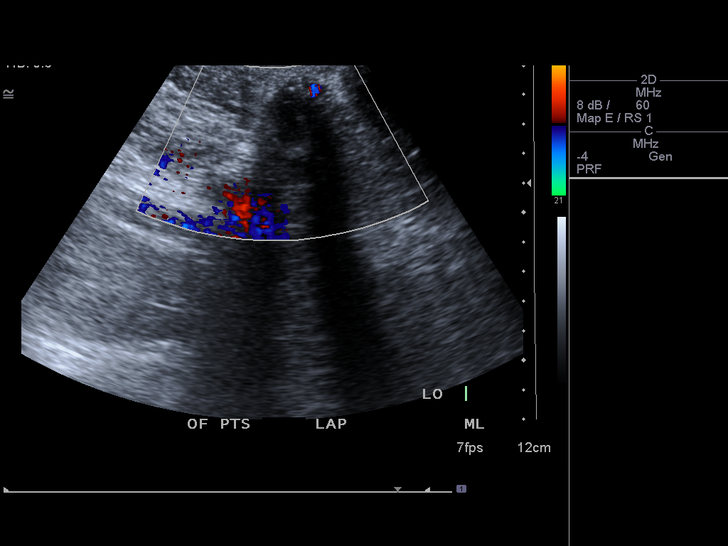
[im 35/47]
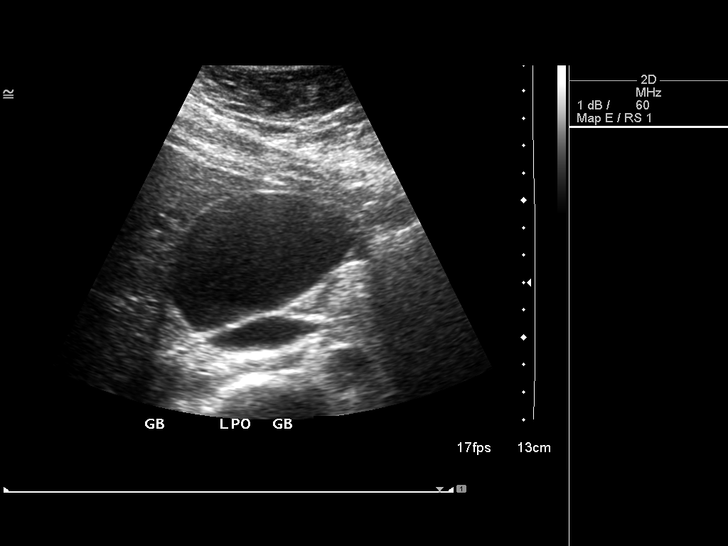
[im 39/47]
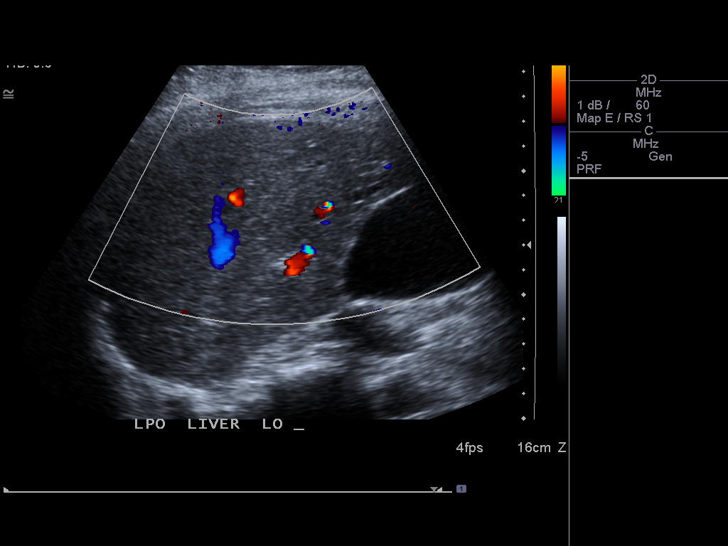
[im 43/47]
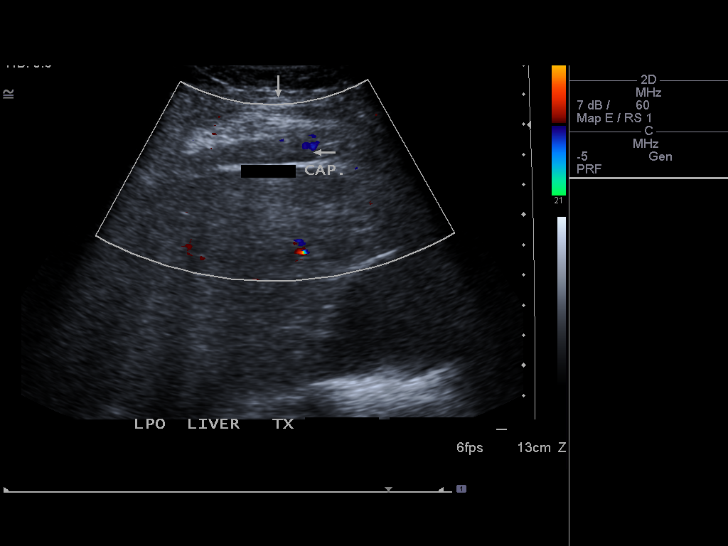
[im 47/47]
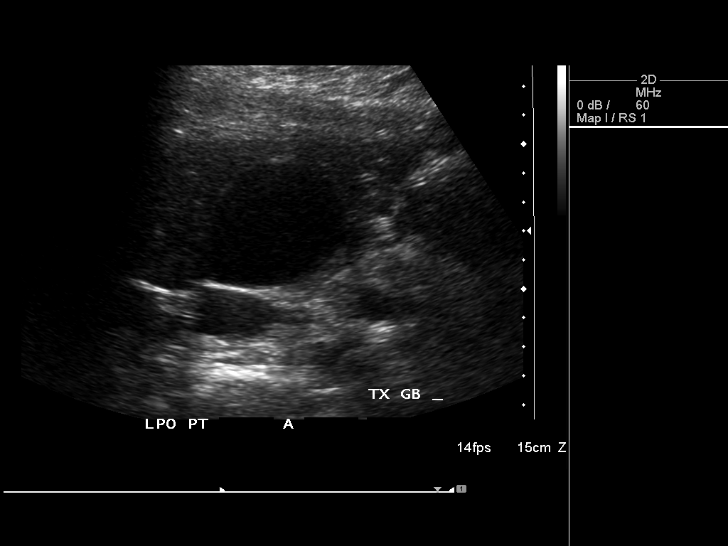

[13 of 25 positions shown; findings below may reference images not displayed]

FINDINGS: Gallbladder:  Well distended without stones or wall thickening.  No
pericholecystic fluid. Right upper quadrant tenderness with
transducer pressure.

Common bile duct:  3 mm diameter, normal.  The internal
echogenicity is seen within the CBD though these may be artifacts
secondary to body habitus.  No discrete shadowing ductal stones
identified.

Liver:  Echogenic, likely fatty infiltration, though this can be
seen with cirrhosis and certain infiltrative disorders.  No
definite hepatic mass or nodularity.  Hepatopetal portal venous
flow.  Suboptimal visualization of intrahepatic detail due to
increased hepatic echogenicity and poor sound transmission.

Additional findings:  Question small amount of free fluid adjacent
to the liver, image 29.  Shadowing region is seen in the
subcutaneous fat in the upper mid abdomen, suspect related to the
known laparoscopic band reservoir and tubing.  An area of hypo
echogenicity seen anterior to the liver, extremely tender with
transducer pressure.  This is of uncertain etiology and is poorly
characterized due to artifacts secondary to body habitus.
IMPRESSION: No definite evidence of gallstones or acute cholecystitis.
Probable fatty infiltration of liver.
Suspect small amount of free fluid adjacent to the liver, with an
additional area of hypo echogenicity anterior to the liver which is
of uncertain etiology.
This could represent a complex fluid collection, rectus abdominus
muscle, or other etiology; this is poorly assessed due to body
habitus.
Due to the above sonographic findings and significant tenderness in
the upper mid abdomen at the site of sonography, recommend CT
imaging of the abdomen with IV and oral contrast to better
evaluate.

## 2011-05-07 NOTE — Progress Notes (Signed)
  Subjective:    Patient ID: Jamie Mccarthy, female    DOB: 05-May-1975, 36 y.o.   MRN: [id number]  HPI Patient has had a history of nausea and vomiting since lap bad procedure in 2004. Her gallbladder has been worked up before and was negative. For the past 3 days the vomiting has gotten worse and is about 3-4 times a day and worse at night. She denies any bowel changes with diarrhea. She has had no fever but has been extremely tired. LMP was last week but lighter than normal and not on any birth control. She has been unable to eat in 2 days. She still is able to keep down water and liquids. She denies any chills, abdominal pains, or sweating.    Review of Systems     Objective:   Physical Exam  Constitutional: She is oriented to person, place, and time. She appears well-developed and well-nourished.  HENT:  Head: Normocephalic and atraumatic.  Eyes: Conjunctivae are normal.       No jaundice.  Neck: Normal range of motion. Neck supple.  Cardiovascular: Normal rate and normal heart sounds.   Pulmonary/Chest: Effort normal and breath sounds normal. She has no wheezes.  Abdominal: Soft. Bowel sounds are normal. She exhibits no distension. There is tenderness.       Tenderness and guarding with light and deep palpation over the RUQ. More pain when pressing in the abdomen than out. No masses felt. Liver seems to be within normal limits; difficult to completely exam due to body habitus. Lab band port located in the epigastric area.  Neurological: She is alert and oriented to person, place, and time.  Skin: Skin is warm and dry.  Psychiatric: She has a normal mood and affect. Her behavior is normal.          Assessment & Plan:  Abdominal pain/RUQ/Nausea and vomiting- Pregnancy test negative. Will get labs(CBC,CMP) and call with results. Get Gallbladder U/S. Will give zofran for nausea. Patient was driving and could not give phenergan in office. If results are negative need to go back to lap  band surgeon and have lap band readjusted.   HTN- Recheck in 1 month and discuss weight loss. Instructed patient to watch sodium intake.  Discussed need to Tdap and declined at this time.

## 2011-05-07 NOTE — Patient Instructions (Addendum)
Get ultrasound of Gallbladder. Will call with results. Follow up on weight loss and elevated blood pressure in the next month and we can see if still elevated and discuss weight loss options.

## 2011-05-08 ENCOUNTER — Telehealth: Payer: Self-pay | Admitting: *Deleted

## 2011-05-08 MED ORDER — ONDANSETRON HCL 4 MG PO TABS: 4.0000 mg | ORAL_TABLET | Freq: Three times a day (TID) | ORAL | Status: AC | PRN

## 2011-05-08 NOTE — Telephone Encounter (Signed)
Pt states she used to take B12 shots for her anemia and would like to know if she should start taking them again.

## 2011-05-08 NOTE — Telephone Encounter (Signed)
Yes. Did you give them to yourself or come into office?

## 2011-05-23 ENCOUNTER — Telehealth: Payer: Self-pay | Admitting: *Deleted

## 2011-05-23 ENCOUNTER — Other Ambulatory Visit: Payer: 59

## 2011-05-23 NOTE — Telephone Encounter (Signed)
Authorization code for prior auth for a CT abd w/contrast.  [id number] good thru 07/07/2011.

## 2011-05-30 ENCOUNTER — Ambulatory Visit (INDEPENDENT_AMBULATORY_CARE_PROVIDER_SITE_OTHER): Payer: 59 | Admitting: Family Medicine

## 2011-05-30 ENCOUNTER — Encounter: Payer: Self-pay | Admitting: Family Medicine

## 2011-05-30 VITALS — BP 137/86 | HR 66 | Ht 67.0 in | Wt 229.0 lb

## 2011-05-30 DIAGNOSIS — F419 Anxiety disorder, unspecified: Secondary | ICD-10-CM

## 2011-05-30 DIAGNOSIS — F411 Generalized anxiety disorder: Secondary | ICD-10-CM

## 2011-05-30 DIAGNOSIS — K769 Liver disease, unspecified: Secondary | ICD-10-CM

## 2011-05-30 DIAGNOSIS — R945 Abnormal results of liver function studies: Secondary | ICD-10-CM

## 2011-05-30 DIAGNOSIS — E669 Obesity, unspecified: Secondary | ICD-10-CM

## 2011-05-30 MED ORDER — PHENTERMINE HCL 37.5 MG PO CAPS: 37.5000 mg | ORAL_CAPSULE | ORAL | Status: DC

## 2011-05-30 MED ORDER — SERTRALINE HCL 50 MG PO TABS: 50.0000 mg | ORAL_TABLET | Freq: Every day | ORAL | Status: DC

## 2011-05-30 NOTE — Progress Notes (Signed)
Subjective:    Patient ID: Jamie Mccarthy, female    DOB: [birth year], 36 y.o.   MRN: [id number]  HPI Anxiety - she has been really stressed lately. Her job is very stressful. Her husband also recently had surgery consisting of his foot completely. She also has 3 very young children is getting a little overwhelmed. She denies feeling depressed since she is having difficulty relaxing and stopping her nerves. She has never taken medication antidepressant before.  Obesity-she wants to she can restart phentermine. She has taken before and then with it. She is to record her weight most of her life. She is not exercising regularly right now.  Fluid around the liver - she still has not gone for her scan yet. She said she is having difficulty getting off from work but says she will schedule in the next couple weeks. She wonders if her port from her bariatric surgery could be leaking or possibly an infection that is causing some of the fluid. She does have a lot of tenderness and soreness in the area.   Review of Systems History   Social History  . Marital Status: Married    Spouse Name: N/A    Number of Children: 1  . Years of Education: N/A   Occupational History  . [employer]    Social History Main Topics  . Smoking status: Never Smoker   . Smokeless tobacco: Not on file  . Alcohol Use: 0.5 oz/week    1 drink(s) per week     per week  . Drug Use: No  . Sexually Active: Yes     separated, 2 caffeine drinks daily.    Other Topics Concern  . Not on file   Social History Narrative   2 caffeine drinks per day        Objective:   Physical Exam  Constitutional: She is oriented to person, place, and time. She appears well-developed and well-nourished.  HENT:  Head: Normocephalic and atraumatic.  Cardiovascular: Normal rate, regular rhythm and normal heart sounds.   Pulmonary/Chest: Effort normal and breath sounds normal.  Neurological: She is alert and oriented to person, place,  and time.  Skin: Skin is warm and dry.  Psychiatric: She has a normal mood and affect. Her behavior is normal.          Assessment & Plan:  Anxiety. Her CAD 7 score is 18 today. I really think she would benefit from medication and maybe even therapy. She is more interested in a medication at this point in time. We'll start with sertraline. Warned of potential side effects. I also explained her that take several weeks to start working. Followup in 3-4 weeks to see how she's doing and adjust her dose. She will start with half of a tab daily for 1 week and then increase to a whole tab. She can call if she has any bumps or concerns.  Fluid around the liver-I encouraged her to try to schedule her CT as soon as possible so we can figure out what's going on. She may also need to consider going back to see her bariatric surgeon about her port. She is still vomiting every night.  Obesity-we can certainly restart phentermine. We will need to keep an eye on her blood pressure since it does tend to run a little borderline. She'll see to start getting some more regular exercise which and is difficult right now working full-time and taking care of her children and her husband who  has had foot surgery. Followup in one month for blood pressure and weight check.

## 2011-05-30 NOTE — Patient Instructions (Signed)
Sertraline 1/2 tab daily for one week, then increase to whole tab daily.

## 2011-06-20 ENCOUNTER — Ambulatory Visit: Payer: 59 | Admitting: Family Medicine

## 2011-06-20 DIAGNOSIS — Z0289 Encounter for other administrative examinations: Secondary | ICD-10-CM

## 2011-07-11 ENCOUNTER — Ambulatory Visit: Payer: 59 | Admitting: Family Medicine

## 2011-07-12 ENCOUNTER — Encounter: Payer: Self-pay | Admitting: *Deleted

## 2011-07-18 ENCOUNTER — Ambulatory Visit: Payer: 59

## 2011-07-20 ENCOUNTER — Ambulatory Visit: Payer: 59 | Admitting: Physician Assistant

## 2011-07-31 ENCOUNTER — Ambulatory Visit: Payer: 59 | Admitting: Family Medicine

## 2011-07-31 ENCOUNTER — Ambulatory Visit (INDEPENDENT_AMBULATORY_CARE_PROVIDER_SITE_OTHER): Payer: 59 | Admitting: Family Medicine

## 2011-07-31 ENCOUNTER — Encounter: Payer: Self-pay | Admitting: Family Medicine

## 2011-07-31 VITALS — BP 121/84 | HR 83 | Ht 67.0 in | Wt 224.0 lb

## 2011-07-31 DIAGNOSIS — F411 Generalized anxiety disorder: Secondary | ICD-10-CM

## 2011-07-31 DIAGNOSIS — E669 Obesity, unspecified: Secondary | ICD-10-CM

## 2011-07-31 DIAGNOSIS — F419 Anxiety disorder, unspecified: Secondary | ICD-10-CM

## 2011-07-31 MED ORDER — PHENTERMINE HCL 37.5 MG PO CAPS: 37.5000 mg | ORAL_CAPSULE | ORAL | Status: DC

## 2011-07-31 MED ORDER — SERTRALINE HCL 100 MG PO TABS: 100.0000 mg | ORAL_TABLET | Freq: Every day | ORAL | Status: DC

## 2011-07-31 NOTE — Progress Notes (Signed)
  Subjective:    Patient ID: Jamie Mccarthy, female    DOB: October 03, 1975, 36 y.o.   MRN: [id number]  HPI ANxiety - Just moved yesterday.  That was stressful. Tolerateing the sertraline well without SE. Feels helping some but not enough.  Sleep is fair. No wieght gain.   Obesity - No Cp or SOB on the phentermine. Has lost 5 lbs. Has been exercising some.     Review of Systems     Objective:   Physical Exam  Constitutional: She is oriented to person, place, and time. She appears well-developed and well-nourished.  HENT:  Head: Normocephalic and atraumatic.  Cardiovascular: Normal rate, regular rhythm and normal heart sounds.   Pulmonary/Chest: Effort normal and breath sounds normal.  Neurological: She is alert and oriented to person, place, and time.  Skin: Skin is warm and dry.  Psychiatric: She has a normal mood and affect. Her behavior is normal.          Assessment & Plan:  Anxiety - GAD- 7 score of 16 today. Not well controlled.  Will inc setraline to 100mg  daily. F/u in 1 months  Obesity - Says doing well and would like refill on the phentermine. Will refill today. Rehceck BP and weigh tin one month.    Fluid around the liver - Still encouraged her to followup with her surgeon to have her port reevaluated said she did have fluid around her liver last time. She said her work as been so busy she just hasn't been able to do it I encouraged her to try to get in soon.

## 2011-08-28 ENCOUNTER — Ambulatory Visit: Payer: 59 | Admitting: Family Medicine

## 2011-09-21 ENCOUNTER — Encounter: Payer: Self-pay | Admitting: Family Medicine

## 2011-09-21 ENCOUNTER — Ambulatory Visit (INDEPENDENT_AMBULATORY_CARE_PROVIDER_SITE_OTHER): Payer: 59 | Admitting: Family Medicine

## 2011-09-21 VITALS — BP 125/81 | HR 90 | Wt 229.0 lb

## 2011-09-21 DIAGNOSIS — R1110 Vomiting, unspecified: Secondary | ICD-10-CM

## 2011-09-21 DIAGNOSIS — J309 Allergic rhinitis, unspecified: Secondary | ICD-10-CM

## 2011-09-21 DIAGNOSIS — F411 Generalized anxiety disorder: Secondary | ICD-10-CM

## 2011-09-21 DIAGNOSIS — F419 Anxiety disorder, unspecified: Secondary | ICD-10-CM

## 2011-09-21 DIAGNOSIS — R059 Cough, unspecified: Secondary | ICD-10-CM

## 2011-09-21 DIAGNOSIS — R05 Cough: Secondary | ICD-10-CM

## 2011-09-21 MED ORDER — RANITIDINE HCL 150 MG PO TABS: 150.0000 mg | ORAL_TABLET | Freq: Two times a day (BID) | ORAL | Status: DC

## 2011-09-21 NOTE — Progress Notes (Signed)
  Subjective:    Patient ID: Jamie Mccarthy, female    DOB: 19-Aug-1975, 36 y.o.   MRN: [id number]  HPI  Has been sneezing only at work and had tried some OTC allergy meds. Slight cough at night.  No fever or sore throat. No productive cough. No significant nasal congestion. No fever or other cold symptoms. She seems only sneezed at work and wonders if there may be something of that history Ms. Her cough also only seems to be at night it doesn't bother her at all during the daytime. She is off of her reflux medications.  Period was 08/30/11 but ws extremely light but has been vomiting more frequently than usuall. Has been very nauseated.  Dec appetite.  Thinks could be pregnant.  Would like to be tested. She denies any significant abdominal pain or fevers. She does have a history of gastric banding and has frequent vomiting chronically.  Anxiety-she has never filled the sertraline or the phentermine.  Review of Systems     Objective:   Physical Exam  Constitutional: She is oriented to person, place, and time. She appears well-developed and well-nourished.  HENT:  Head: Normocephalic and atraumatic.  Right Ear: External ear normal.  Left Ear: External ear normal.  Nose: Nose normal.  Mouth/Throat: Oropharynx is clear and moist.       TMs and canals are clear.   Eyes: Conjunctivae and EOM are normal. Pupils are equal, round, and reactive to light.  Neck: Neck supple. No thyromegaly present.  Cardiovascular: Normal rate, regular rhythm and normal heart sounds.   Pulmonary/Chest: Effort normal and breath sounds normal. She has no wheezes.  Lymphadenopathy:    She has no cervical adenopathy.  Neurological: She is alert and oriented to person, place, and time.  Skin: Skin is warm and dry.  Psychiatric: She has a normal mood and affect.          Assessment & Plan:  AR-her exam is normal. I do think that most of her cough and sneezing is related to allergies. Continue over-the-counter  oral antihistamine.  Vomiting - she has recurrent vomiting after having had her gastric banding years ago. Her vomiting has been increased recently. She denies any significant abdominal pain fever et Karie Soda. She has not been around any viral illnesses that she is aware of consistent with gastroenteritis. We will check for pregnancy today. UPT was negative today. Her symptoms could also be coming from worsening reflux especially with a nighttime cough. Let's restart her ranitidine 150 mg twice a day and see if the next week or 2 she feels much better. We will also check a CMP to make sure her liver enzymes are normal as well as her pancreatic enzymes even though she is not having any abdominal pain today. We will call her with the results.  Anxiety-she has not started the sertraline yet but says she plans to pick it up later today. Followup in one month.  Abnormal weight gain-she has been to maintain her weight since I last saw her. She does plan to start the phentermine but wanted to check with the pregnancy issue first before filling it and starting at which I think is a great idea. She is negative for pregnancy today.

## 2011-09-28 ENCOUNTER — Telehealth: Payer: Self-pay | Admitting: *Deleted

## 2011-09-28 NOTE — Telephone Encounter (Signed)
Have tried 3 different days this week trying to contact pt in regards of her insurance. Received a call from [facility] at [facility] stating pt needed prior auth for the CT scan. When I tried to [employer] they informed me that they couldn't pull her up under the [facility]. I have attempted again to contact pt.

## 2011-11-09 ENCOUNTER — Ambulatory Visit (INDEPENDENT_AMBULATORY_CARE_PROVIDER_SITE_OTHER): Payer: 59 | Admitting: Physician Assistant

## 2011-11-09 ENCOUNTER — Encounter: Payer: Self-pay | Admitting: Physician Assistant

## 2011-11-09 VITALS — BP 134/82 | HR 94 | Temp 98.3°F | Ht 67.0 in | Wt 237.0 lb

## 2011-11-09 DIAGNOSIS — K769 Liver disease, unspecified: Secondary | ICD-10-CM

## 2011-11-09 DIAGNOSIS — R062 Wheezing: Secondary | ICD-10-CM

## 2011-11-09 DIAGNOSIS — G8929 Other chronic pain: Secondary | ICD-10-CM

## 2011-11-09 DIAGNOSIS — R1011 Right upper quadrant pain: Secondary | ICD-10-CM

## 2011-11-09 DIAGNOSIS — J329 Chronic sinusitis, unspecified: Secondary | ICD-10-CM

## 2011-11-09 DIAGNOSIS — N926 Irregular menstruation, unspecified: Secondary | ICD-10-CM

## 2011-11-09 DIAGNOSIS — R0602 Shortness of breath: Secondary | ICD-10-CM

## 2011-11-09 MED ORDER — AMOXICILLIN-POT CLAVULANATE 875-125 MG PO TABS: 1.0000 | ORAL_TABLET | Freq: Two times a day (BID) | ORAL | Status: DC

## 2011-11-09 MED ORDER — FLUCONAZOLE 150 MG PO TABS: 150.0000 mg | ORAL_TABLET | Freq: Once | ORAL | Status: DC

## 2011-11-09 MED ORDER — METHYLPREDNISOLONE SODIUM SUCC 125 MG IJ SOLR
125.0000 mg | Freq: Once | INTRAMUSCULAR | Status: AC
  Administered 2011-11-09: 125 mg via INTRAMUSCULAR

## 2011-11-09 NOTE — Patient Instructions (Addendum)
Start Augmentin. Shot of steroid given in office. F/U with pap smear and discuss OB concerns.  Sinusitis Sinusitis is redness, soreness, and swelling (inflammation) of the paranasal sinuses. Paranasal sinuses are air pockets within the bones of your face (beneath the eyes, the middle of the forehead, or above the eyes). In healthy paranasal sinuses, mucus is able to drain out, and air is able to circulate through them by way of your nose. However, when your paranasal sinuses are inflamed, mucus and air can become trapped. This can allow bacteria and other germs to grow and cause infection. Sinusitis can develop quickly and last only a short time (acute) or continue over a long period (chronic). Sinusitis that lasts for more than 12 weeks is considered chronic.  CAUSES  Causes of sinusitis include:  Allergies.  Structural abnormalities, such as displacement of the cartilage that separates your nostrils (deviated septum), which can decrease the air flow through your nose and sinuses and affect sinus drainage.  Functional abnormalities, such as when the small hairs (cilia) that line your sinuses and help remove mucus do not work properly or are not present. SYMPTOMS  Symptoms of acute and chronic sinusitis are the same. The primary symptoms are pain and pressure around the affected sinuses. Other symptoms include:  Upper toothache.  Earache.  Headache.  Bad breath.  Decreased sense of smell and taste.  A cough, which worsens when you are lying flat.  Fatigue.  Fever.  Thick drainage from your nose, which often is green and may contain pus (purulent).  Swelling and warmth over the affected sinuses. DIAGNOSIS  Your caregiver will perform a physical exam. During the exam, your caregiver may:  Look in your nose for signs of abnormal growths in your nostrils (nasal polyps).  Tap over the affected sinus to check for signs of infection.  View the inside of your sinuses (endoscopy)  with a special imaging device with a light attached (endoscope), which is inserted into your sinuses. If your caregiver suspects that you have chronic sinusitis, one or more of the following tests may be recommended:  Allergy tests.  Nasal culture A sample of mucus is taken from your nose and sent to a lab and screened for bacteria.  Nasal cytology A sample of mucus is taken from your nose and examined by your caregiver to determine if your sinusitis is related to an allergy. TREATMENT  Most cases of acute sinusitis are related to a viral infection and will resolve on their own within 10 days. Sometimes medicines are prescribed to help relieve symptoms (pain medicine, decongestants, nasal steroid sprays, or saline sprays).  However, for sinusitis related to a bacterial infection, your caregiver will prescribe antibiotic medicines. These are medicines that will help kill the bacteria causing the infection.  Rarely, sinusitis is caused by a fungal infection. In theses cases, your caregiver will prescribe antifungal medicine. For some cases of chronic sinusitis, surgery is needed. Generally, these are cases in which sinusitis recurs more than 3 times per year, despite other treatments. HOME CARE INSTRUCTIONS   Drink plenty of water. Water helps thin the mucus so your sinuses can drain more easily.  Use a humidifier.  Inhale steam 3 to 4 times a day (for example, sit in the bathroom with the shower running).  Apply a warm, moist washcloth to your face 3 to 4 times a day, or as directed by your caregiver.  Use saline nasal sprays to help moisten and clean your sinuses.  Take over-the-counter  or prescription medicines for pain, discomfort, or fever only as directed by your caregiver. SEEK IMMEDIATE MEDICAL CARE IF:  You have increasing pain or severe headaches.  You have nausea, vomiting, or drowsiness.  You have swelling around your face.  You have vision problems.  You have a stiff  neck.  You have difficulty breathing. MAKE SURE YOU:   Understand these instructions.  Will watch your condition.  Will get help right away if you are not doing well or get worse. Document Released: 01/08/2005 Document Revised: 04/02/2011 Document Reviewed: 01/23/2011 [facility] Patient Information 2013 [place], [place].

## 2011-11-09 NOTE — Progress Notes (Signed)
  Subjective:    Patient ID: Jamie Mccarthy, female    DOB: [birth year], 36 y.o.   MRN: [id number]  HPI Patient is a 36 yo female who presents with sinus pressure for 7 days. Last Friday she had a runny nose. She has also been coughing a lot and once she coughed so hard she threw up. She has also been SOB and wheezing that is worse at night. Denies any fever or history of asthma like symptoms. She feels very congested and complains of a lot of sinus pressure. Not taking anything to make better. No fever, chills, muscle aches.   She never got CT of abdomen due to work schedule. She wants CT now. She is still having right quadrant and flank pain. Prior u/s reviled fluid around liver. Pt ready for imaging. Does feel like nausea and vomiting has improved.   Irregular bleeding and spotting after normal peroid. Hx of fibroids. Not had evaluate in a long time. Denies any pain assoicated. Has not done anything to make better. TSH recently checked and was normal.      Review of Systems     Objective:   Physical Exam  Constitutional: She is oriented to person, place, and time. She appears well-developed and well-nourished.  HENT:  Head: Normocephalic and atraumatic.  Right Ear: External ear normal.  Left Ear: External ear normal.  Mouth/Throat: Oropharynx is clear and moist. No oropharyngeal exudate.       TM's clear bilaterally. No blood or pus.   Maxillary tenderness to palpation bilaterally.   Rhinorrhea present.  Eyes: Conjunctivae normal are normal.  Neck: Normal range of motion. Neck supple.  Cardiovascular: Normal rate, regular rhythm, normal heart sounds and intact distal pulses.   Pulmonary/Chest: Effort normal.       Mild wheezing heard at apex of right lung.   Abdominal: Soft. Bowel sounds are normal. There is tenderness.       Mild tenderness to palpation of right upper quadrant.   Lymphadenopathy:    She has no cervical adenopathy.  Neurological: She is alert and oriented to  person, place, and time.  Skin: Skin is warm and dry.  Psychiatric: She has a normal mood and affect. Her behavior is normal.          Assessment & Plan:  Sinusitis- Augmentin given for 10days. Gave handout for symptomatic care. Call office if not improving.  SOB/Wheezing- Offered to give albuterol. Pt declined albuterol inhaler. Did give Solumedrol 125. Call if worsening or not improving.    Liver issues/right flank pain/right upper quadrant pain- Will see if we can get CT approved again since prior auth was done a few months back.  Irregular bleeding- Ongoing for 2 months. Reassured patient. Discussed regulating with OCP's. If bleeding continues could consider pelvic u/s. Hx of fibroids most likey a contributing factor.

## 2011-12-10 ENCOUNTER — Encounter: Payer: Self-pay | Admitting: Family Medicine

## 2011-12-10 ENCOUNTER — Ambulatory Visit (INDEPENDENT_AMBULATORY_CARE_PROVIDER_SITE_OTHER): Payer: 59 | Admitting: Family Medicine

## 2011-12-10 VITALS — BP 136/80 | HR 93 | Ht 67.0 in | Wt 239.0 lb

## 2011-12-10 DIAGNOSIS — D509 Iron deficiency anemia, unspecified: Secondary | ICD-10-CM

## 2011-12-10 DIAGNOSIS — R1011 Right upper quadrant pain: Secondary | ICD-10-CM

## 2011-12-10 DIAGNOSIS — Z3490 Encounter for supervision of normal pregnancy, unspecified, unspecified trimester: Secondary | ICD-10-CM

## 2011-12-10 DIAGNOSIS — N926 Irregular menstruation, unspecified: Secondary | ICD-10-CM

## 2011-12-10 DIAGNOSIS — Z331 Pregnant state, incidental: Secondary | ICD-10-CM

## 2011-12-10 DIAGNOSIS — D573 Sickle-cell trait: Secondary | ICD-10-CM

## 2011-12-10 MED ORDER — RANITIDINE HCL 150 MG PO TABS: 150.0000 mg | ORAL_TABLET | Freq: Two times a day (BID) | ORAL | Status: DC

## 2011-12-10 MED ORDER — CONCEPT OB 130-92.4-1 MG PO CAPS: 1.0000 | ORAL_CAPSULE | Freq: Every day | ORAL | Status: DC

## 2011-12-10 NOTE — Patient Instructions (Addendum)
Zantac twice a day x 2 weeks.  Start a prenatal vitamin.

## 2011-12-10 NOTE — Progress Notes (Signed)
  Subjective:    Patient ID: Jamie Mccarthy, female    DOB: 1975-12-05, 36 y.o.   MRN: [id number]  HPI Last periods was off.  Says had had 2 periods in October but hasn't had one since then. Her first period was between October 1 to the fourth. A second period started around the 13th of 14. Since then has had hotflashes, SOB, and has been very nauseted. Has had a lot of weight gain in the last 3 weeks.  She suspects that she is pregnant. She felt as well her last pregnancy when she had twins. Vomited blood this AM.  No gastritis or reflux sxs.  A lot of abodminal pressure from epigastrum to umbilicus.  Normal BMs. Doesn't olerate oral iron, causes constipation.  Home preg test was + .    Review of Systems     Objective:   Physical Exam  Constitutional: She is oriented to person, place, and time. She appears well-developed and well-nourished.  HENT:  Head: Normocephalic and atraumatic.  Cardiovascular: Normal rate, regular rhythm and normal heart sounds.   Pulmonary/Chest: Effort normal and breath sounds normal.  Abdominal: Soft. Bowel sounds are normal. She exhibits no distension and no mass. There is tenderness. There is no rebound and no guarding.       Tender in teh RUQ and mildly in the RLQ.  Neurological: She is alert and oriented to person, place, and time.  Skin: Skin is warm and dry.  Psychiatric: She has a normal mood and affect. Her behavior is normal.          Assessment & Plan:  RUQ Pain - Had normal [place].  We had ordered a CT for further evaluation because of some loculated fluid near her liver. Fortunately this has not been done yet.  I explained to her that we will have to hold off because we cannot do CT while she is pregnant. If in fact she does get worse then we may need to start with repeating her ultrasound first. If nausea and pain continue consider GI referral. For now would like her to start Zantac for least 2 weeks. I suspect that she may have some esophageal  irritation that probably caused the blood in the vomit this morning. Zantac is class B. for pregnancy.make sure drinking plenty of fluids.    Pregnancy-recommend restart prenatal vitamin. She says she's taken concept -OB in the past and it worked well. Please go ahead and call her OB to schedule an appointment. I'm not sure when they like to see her either 8 weeks or 12 weeks. We will get a serum hCG to confirm.  History of iron deficiency anemia  - Will check Ferritin. She says she's also heterozygous for a sickle cell anemia.

## 2011-12-22 ENCOUNTER — Inpatient Hospital Stay (HOSPITAL_COMMUNITY): Payer: 59

## 2011-12-22 ENCOUNTER — Encounter (HOSPITAL_COMMUNITY): Payer: Self-pay | Admitting: *Deleted

## 2011-12-22 ENCOUNTER — Inpatient Hospital Stay (HOSPITAL_COMMUNITY)
Admission: AD | Admit: 2011-12-22 | Discharge: 2011-12-22 | Disposition: A | Discharge status: Home / Self Care | Payer: 59 | Source: Ambulatory Visit | Attending: Obstetrics and Gynecology | Admitting: Obstetrics and Gynecology

## 2011-12-22 ENCOUNTER — Inpatient Hospital Stay (INDEPENDENT_AMBULATORY_CARE_PROVIDER_SITE_OTHER)
Admission: EM | Admit: 2011-12-22 | Discharge: 2011-12-22 | Disposition: A | Discharge status: Home / Self Care | Payer: 59 | Source: Home / Self Care | Attending: Emergency Medicine | Admitting: Emergency Medicine

## 2011-12-22 DIAGNOSIS — R102 Pelvic and perineal pain: Secondary | ICD-10-CM

## 2011-12-22 DIAGNOSIS — N949 Unspecified condition associated with female genital organs and menstrual cycle: Secondary | ICD-10-CM

## 2011-12-22 DIAGNOSIS — B373 Candidiasis of vulva and vagina: Secondary | ICD-10-CM

## 2011-12-22 DIAGNOSIS — R109 Unspecified abdominal pain: Secondary | ICD-10-CM

## 2011-12-22 DIAGNOSIS — B3731 Acute candidiasis of vulva and vagina: Secondary | ICD-10-CM

## 2011-12-22 DIAGNOSIS — O99891 Other specified diseases and conditions complicating pregnancy: Secondary | ICD-10-CM | POA: Insufficient documentation

## 2011-12-22 DIAGNOSIS — R1031 Right lower quadrant pain: Secondary | ICD-10-CM | POA: Insufficient documentation

## 2011-12-22 DIAGNOSIS — O26899 Other specified pregnancy related conditions, unspecified trimester: Secondary | ICD-10-CM

## 2011-12-22 DIAGNOSIS — O9989 Other specified diseases and conditions complicating pregnancy, childbirth and the puerperium: Secondary | ICD-10-CM

## 2011-12-22 LAB — WET PREP, GENITAL
Trich, Wet Prep: NONE SEEN
Yeast Wet Prep HPF POC: NONE SEEN

## 2011-12-22 LAB — HCG, QUANTITATIVE, PREGNANCY: hCG, Beta Chain, Quant, S: 113237 m[IU]/mL — ABNORMAL HIGH (ref <5)

## 2011-12-22 LAB — POCT URINALYSIS DIP (DEVICE)
Hgb urine dipstick: NEGATIVE
Nitrite: NEGATIVE
Specific Gravity, Urine: 1.015 (ref 1.005–1.030)
Urobilinogen, UA: 0.2 mg/dL (ref 0.0–1.0)
pH: 5.5 (ref 5.0–8.0)

## 2011-12-22 LAB — CBC
MCH: 19.2 pg — ABNORMAL LOW (ref 26.0–34.0)
MCHC: 31.3 g/dL (ref 30.0–36.0)
MCV: 61.3 fL — ABNORMAL LOW (ref 78.0–100.0)
Platelets: 365 10*3/uL (ref 150–400)
RBC: 4.89 MIL/uL (ref 3.87–5.11)

## 2011-12-22 IMAGING — US US OB COMP LESS 14 WK
1 series · 13 of 24 positions shown · non-contrast
Comparison: none

[Series 1: us ob comp less 14 wks · 13 of 24 slices shown]
[im 1/24]
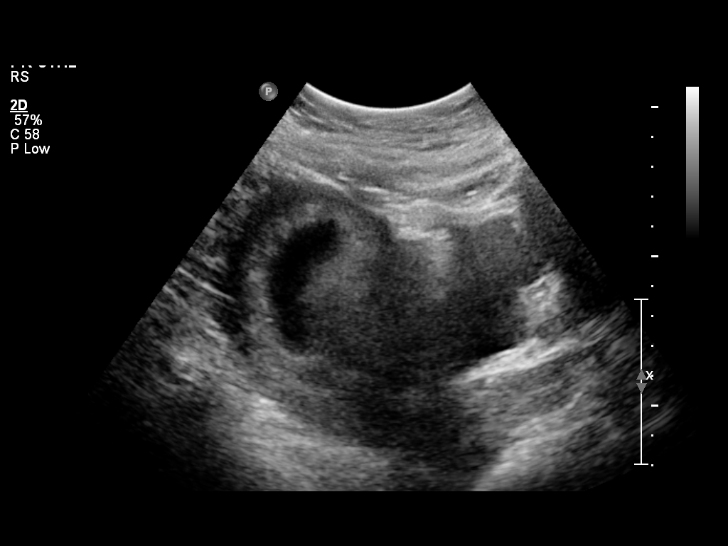
[im 3/24]
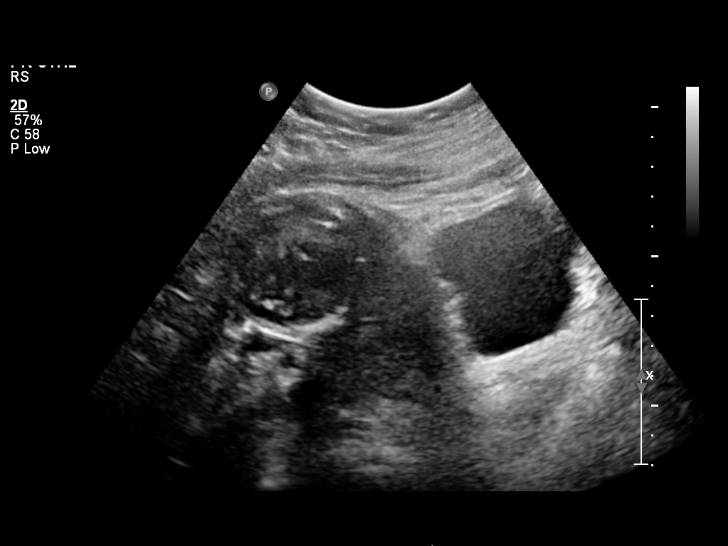
[im 5/24]
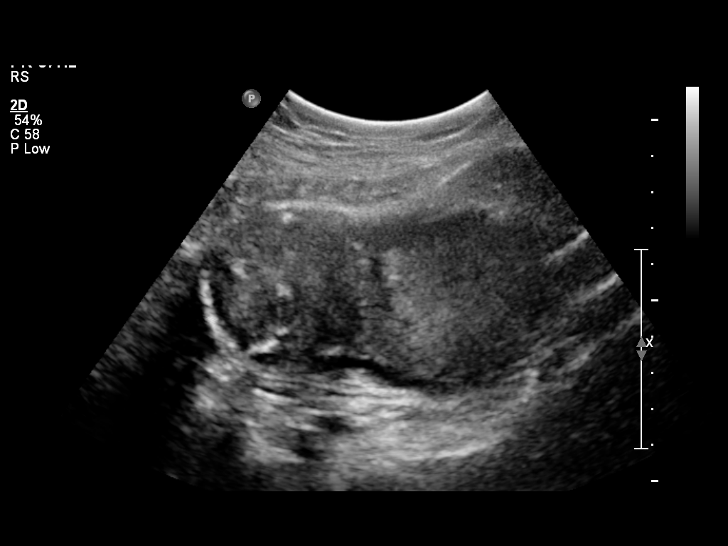
[im 7/24]
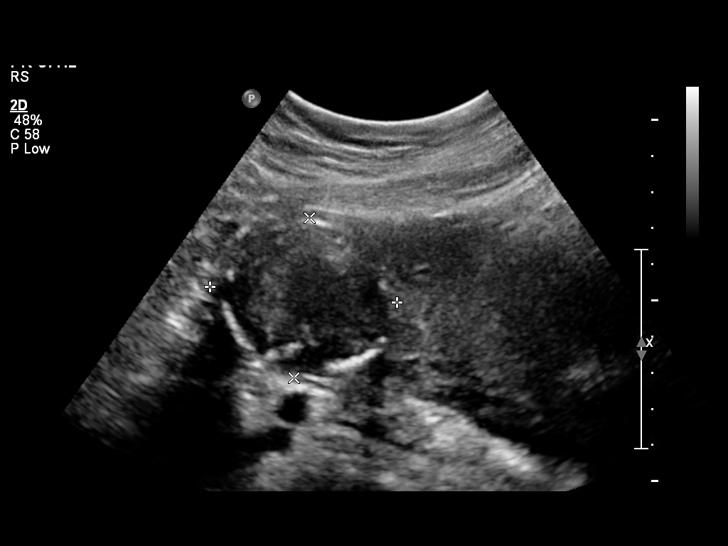
[im 9/24]
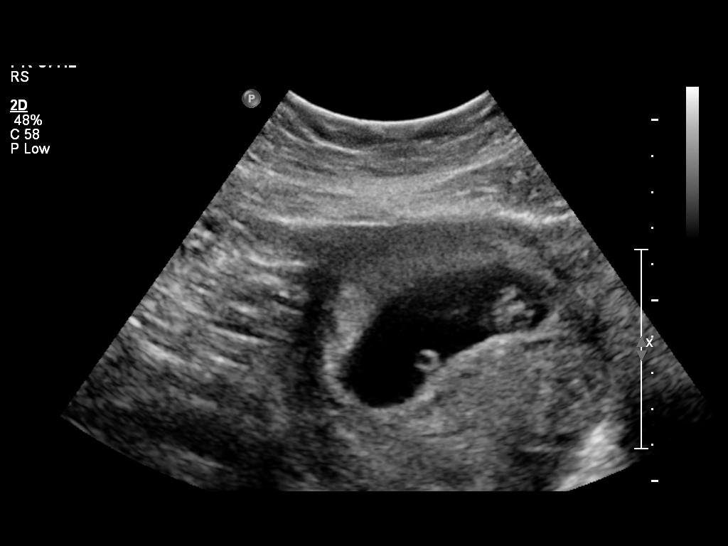
[im 11/24]
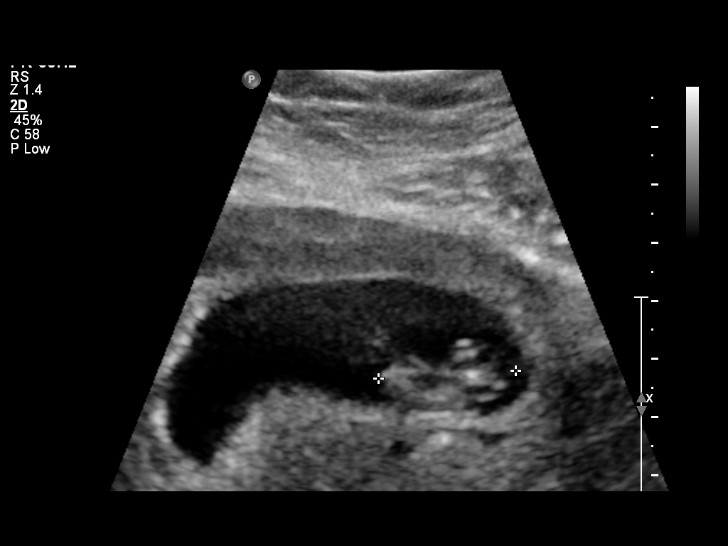
[im 13/24]
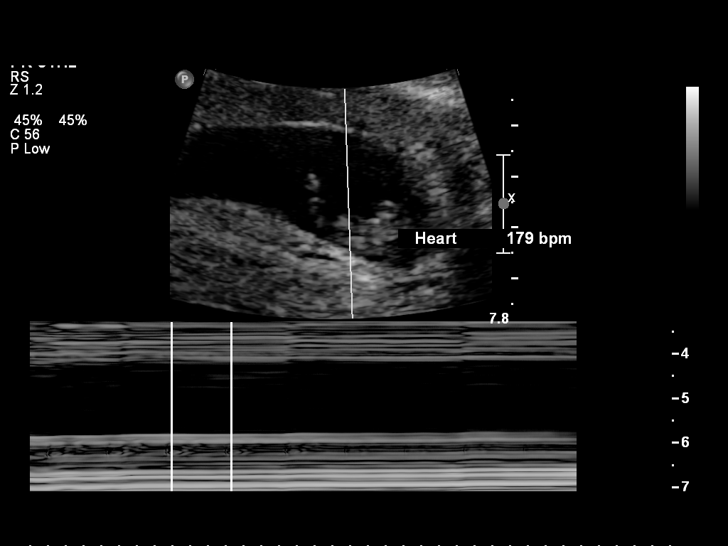
[im 14/24]
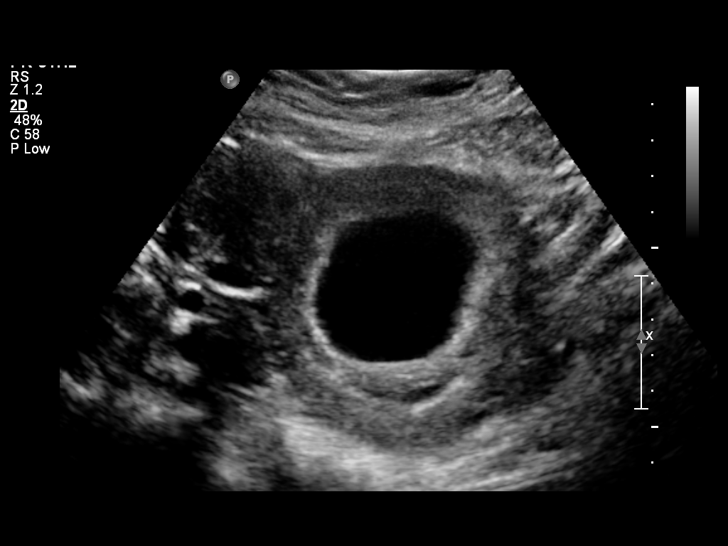
[im 16/24]
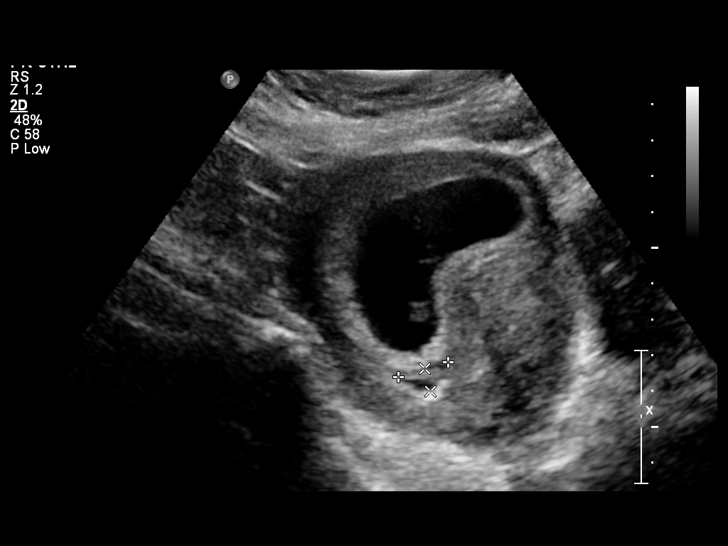
[im 18/24]
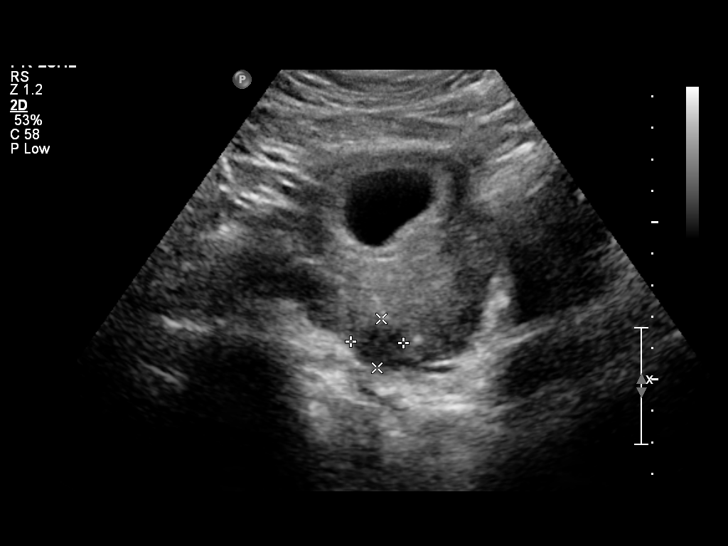
[im 20/24]
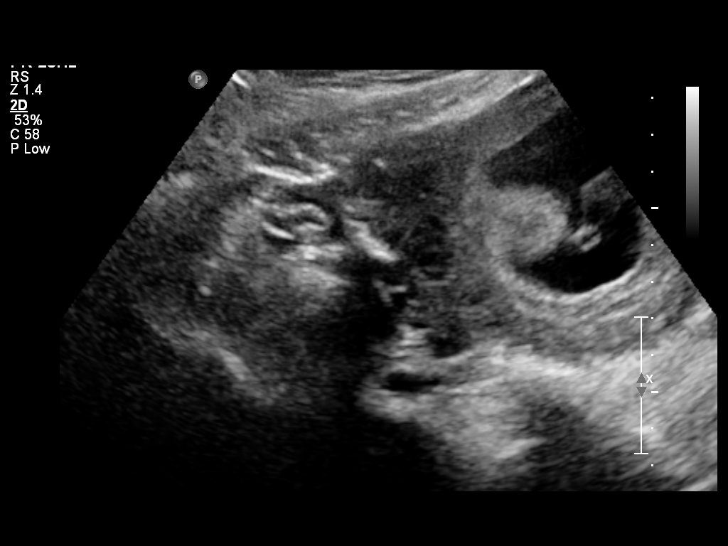
[im 22/24]
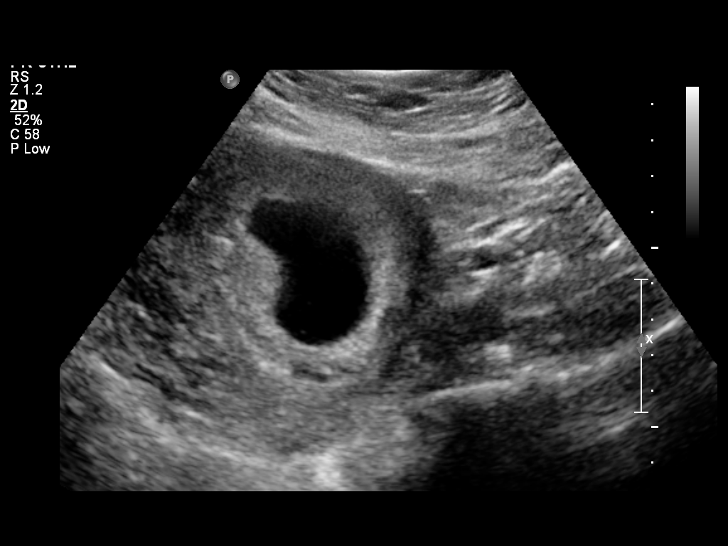
[im 24/24]
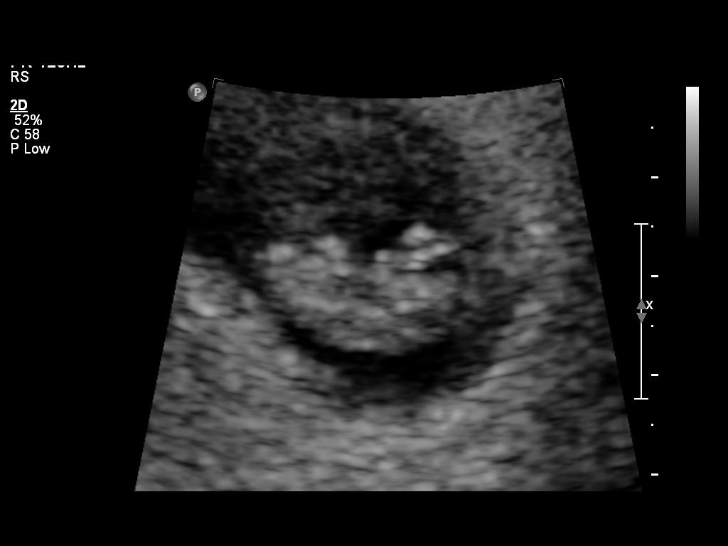

[13 of 24 positions shown; findings below may reference images not displayed]

OBSTETRICS REPORT
                      (Signed Final [DATE] [DATE])

Service(s) Provided

 US OB COMP LESS 14 WKS                                76801.0
Indications

 Pain - RLQ
Fetal Evaluation

 Num Of Fetuses:    1
 Preg. Location:    Intrauterine
 Gest. Sac:         Intrauterine, small
                    subchorionic bleed
 Yolk Sac:          Visualized
 Fetal Pole:        Visualized
 Fetal Heart Rate:  179                         bpm
 Cardiac Activity:  Observed
Biometry

 CRL:     23.4  mm    G. Age:   9w 0d                  EDD:   [DATE]
Gestational Age

 LMP:           8w 6d        Date:   [DATE]                 EDD:   [DATE]
 Best:          8w 6d     Det. By:   LMP  ([DATE])          EDD:   [DATE]
Cervix Uterus Adnexa

 Uterus:       Multiple fibroids noted, see table below.
 Left Ovary:   Not visualized.
 Right Ovary:  Not visualized.

 Adnexa:     No abnormality visualized.
Myomas

 Site                     L(cm)      W(cm)      D(cm)       Location
 Right
 Left

 Blood Flow                  RI       PI       Comments
Impression

 Single living IUP. EGA by ultrasound is concordant with
 dating by LMP.
 Small subchorionic hemorrhage.
 Two uterine fibroids, as detailed above.
Recommendations

 US for fetal anatomic evaluation at 18-19 wks GA.

 questions or concerns.

## 2011-12-22 MED ORDER — PROMETHAZINE HCL 25 MG RE SUPP: 25.0000 mg | Freq: Four times a day (QID) | RECTAL | Status: DC | PRN

## 2011-12-22 MED ORDER — INTEGRA F 125-1 MG PO CAPS: 1.0000 | ORAL_CAPSULE | Freq: Every day | ORAL | Status: DC

## 2011-12-22 NOTE — ED Notes (Signed)
Called [facility] lab for Ferritin and HCG serum qualitative tests.  Monique from [facility] stated they never received specimen

## 2011-12-22 NOTE — MAU Provider Note (Signed)
Chief Complaint: Abdominal Pain   First Provider Initiated Contact with Patient 12/22/11 1554     SUBJECTIVE HPI: Jamie Mccarthy is a 36 y.o. [id number] at [redacted]w[redacted]d by LMP who was sent to MAU from [facility] with intermittent RLQ pain that started yesterday. Pos UPT two weeks ago. LMP 10/21/11 was normal, but longer that usual. Scant bleeding 11/05/11. Denies any vaginal bleeding since. Also reports N/V x 2+ weeks and creamy whit vag discharge x 1 day. Denies fever, chills, constipation, diarrhea, urinary complaints.  Past Medical History  Diagnosis Date  . No pertinent past medical history    OB History    Grav Para Term Preterm Abortions TAB SAB Ect Mult Living   3 2 1 1  0 0 0 0 1 3     # Outc Date GA Lbr Len/2nd Wgt Sex Del Anes PTL Lv   1 TRM  [redacted]w[redacted]d       Yes   2A PRE  [redacted]w[redacted]d      Yes Yes   2B   [redacted]w[redacted]d      Yes Yes   3 CUR              Past Surgical History  Procedure Date  . Laparoscopic gastric banding 2005  . Cesarean section    History   Social History  . Marital Status: Married    Spouse Name: N/A    Number of Children: 1  . Years of Education: N/A   Occupational History  . [employer]    Social History Main Topics  . Smoking status: Never Smoker   . Smokeless tobacco: Not on file  . Alcohol Use: No  . Drug Use: No  . Sexually Active: No     Comment: separated, 2 caffeine drinks daily.    Other Topics Concern  . Not on file   Social History Narrative   2 caffeine drinks per day   Current Facility-Administered Medications on File Prior to Encounter  Medication Dose Route Frequency Provider Last Rate Last Dose  . cyanocobalamin ((VITAMIN B-12)) injection 1,000 mcg  1,000 mcg Intramuscular Q14 Days Agapito Games, MD   1,000 mcg at 10/04/10 1191   Current Outpatient Prescriptions on File Prior to Encounter  Medication Sig Dispense Refill  . Prenat w/o A Vit-FeFum-FePo-FA (CONCEPT OB) 130-92.4-1 MG CAPS Take 1 capsule by mouth daily.  30 capsule   11  . promethazine (PHENERGAN) 25 MG suppository Place 1 suppository (25 mg total) rectally every 6 (six) hours as needed for nausea.  12 each  0   No Known Allergies  ROS: Pertinent items in HPI  OBJECTIVE Blood pressure 141/89, pulse 88, temperature 97.9 F (36.6 C), temperature source Oral, resp. rate 20, height 5\' 7"  (1.702 m), weight 106.142 kg (234 lb), last menstrual period 10/21/2011. GENERAL: Well-developed, well-nourished female in no acute distress.  HEENT: Normocephalic HEART: normal rate RESP: normal effort ABDOMEN: Soft, moderate low abd tenderness w/ guarding, R>L. No rebound tenderness or mass. Exam limited by body habitus.  EXTREMITIES: Nontender, no edema NEURO: Alert and oriented SPECULUM EXAM: NEFG, physiologic discharge, no blood noted, cervix clean BIMANUAL: cervix long and closed; uterus not obviously enlarged, but exam limited by body habitus, no adnexal tenderness or masses  LAB RESULTS Results for orders placed during the hospital encounter of 12/22/11 (from the past 24 hour(s))  CBC     Status: Abnormal   Collection Time   12/22/11  2:22 PM  Component Value Range   WBC 7.3  4.0 - 10.5 K/uL   RBC 4.89  3.87 - 5.11 MIL/uL   Hemoglobin 9.4 (*) 12.0 - 15.0 g/dL   HCT 91.4 (*) 78.2 - 95.6 %   MCV 61.3 (*) 78.0 - 100.0 fL   MCH 19.2 (*) 26.0 - 34.0 pg   MCHC 31.3  30.0 - 36.0 g/dL   RDW 21.3 (*) 08.6 - 57.8 %   Platelets 365  150 - 400 K/uL  HCG, QUANTITATIVE, PREGNANCY     Status: Abnormal   Collection Time   12/22/11  2:22 PM      Component Value Range   hCG, Beta Chain, [place] Longest 469629 (*) <5 mIU/mL  WET PREP, GENITAL     Status: Abnormal   Collection Time   12/22/11  4:00 PM      Component Value Range   Yeast Wet Prep HPF POC NONE SEEN  NONE SEEN   Trich, Wet Prep NONE SEEN  NONE SEEN   Clue Cells Wet Prep HPF POC NONE SEEN  NONE SEEN   WBC, Wet Prep HPF POC FEW (*) NONE SEEN    IMAGING 9.1 weeks live SIUP. FHR 179. Small SCH.  MAU  COURSE Pain resolved spontaneously.  ASSESSMENT 1. Abdominal pain complicating pregnancy, antepartum    PLAN Discharge home GC.CT cultures pending. Follow-up Information    Follow up with Oliver Pila, MD.   Contact information:   [address], [address] [place] [address] [phone]           Medication List     As of 12/22/2011  5:14 PM    TAKE these medications         CONCEPT OB 130-92.4-1 MG Caps   Take 1 capsule by mouth daily.      INTEGRA F 125-1 MG Caps   Take 1 tablet by mouth daily.      promethazine 25 MG suppository   Commonly known as: PHENERGAN   Place 1 suppository (25 mg total) rectally every 6 (six) hours as needed for nausea.       [place], CNM 12/22/2011  5:14 PM

## 2011-12-22 NOTE — ED Provider Notes (Signed)
Chief Complaint  Patient presents with  . Abdominal Cramping    History of Present Illness:   The patient is a 36 year old female who is [redacted] weeks pregnant. She had a pregnancy test about 2 weeks ago done by her primary care physician and was positive. This will be her third pregnancy. She had some issues with both her pregnancies and had one twin pregnancy with premature delivery. Over the past 2 days she's had some crampy lower [place] lower back pain. She denies any vaginal bleeding or discharge. She's had no fever but she has had some chills. She's felt nauseated did not have vomiting. She denies any urinary symptoms, diarrhea, or hematuria.  Review of Systems:  Other than noted above, the patient denies any of the following symptoms: Systemic:  No fever, chills, sweats, fatigue, or weight loss. GI:  No abdominal pain, nausea, anorexia, vomiting, diarrhea, constipation, melena or hematochezia. GU:  No dysuria, frequency, urgency, hematuria, vaginal discharge, itching, or abnormal vaginal bleeding. Skin:  No rash or itching.  PMFSH:  Past medical history, family history, social history, meds, and allergies were reviewed.  Physical Exam:   Vital signs:  BP 142/89  Pulse 91  Temp 98.3 F (36.8 C) (Oral)  Resp 16  SpO2 98%  LMP 10/21/2011 General:  Alert, oriented and in no distress. Lungs:  Breath sounds clear and equal bilaterally.  No wheezes, rales or rhonchi. Heart:  Regular rhythm.  No gallops or murmers. Abdomen:  Soft, flat and non-distended.  No organomegaly or mass.  No tenderness, guarding or rebound.  Bowel sounds normally active. Pelvic exam:  Normal external genitalia, vaginal and cervical mucosa were normal. She did have pain on cervical motion. Uterus was mildly enlarged and mildly tender to palpation. She had bilateral adnexal tenderness more so on the right the left. Skin:  Clear, warm and dry.  Labs:   Results for orders placed during the hospital encounter of 12/22/11    POCT URINALYSIS DIP (DEVICE)      Component Value Range   Glucose, UA NEGATIVE  NEGATIVE mg/dL   Bilirubin Urine NEGATIVE  NEGATIVE   Ketones, ur NEGATIVE  NEGATIVE mg/dL   Specific Gravity, Urine 1.015  1.005 - 1.030   Hgb urine dipstick NEGATIVE  NEGATIVE   pH 5.5  5.0 - 8.0   Protein, ur NEGATIVE  NEGATIVE mg/dL   Urobilinogen, UA 0.2  0.0 - 1.0 mg/dL   Nitrite NEGATIVE  NEGATIVE   Leukocytes, UA TRACE (*) NEGATIVE     Assessment:  The encounter diagnosis was Pelvic pain.  With history of pelvic pain in early pregnancy, possibilities include threatened abortion or ectopic pregnancy. I told the patient she needed to go to [facility] for ultrasound. I called over and reported to the nurse midwife on duty. She agreed. The patient was instructed to go directly to the hospital and not ear drink anything on the way. She may go by private vehicle since she is stable.  Plan:   1.  The following meds were prescribed:   New Prescriptions   No medications on file   2.  The patient was instructed in symptomatic care and handouts were given. 3.  The patient was told to return if becoming worse in any way, if no better in 3 or 4 days, and given some red flag symptoms that would indicate earlier return.    Reuben Likes, MD 12/22/11 1247

## 2011-12-22 NOTE — MAU Note (Signed)
Pt reports having LRQ pain that started yesterday. Denies andy vaginal bleeding had some creamy whit  vag discharge yesterday.

## 2011-12-22 NOTE — ED Notes (Signed)
Pt  Is  [redacted]  Weeks  Pregnant    She  Reports      Low      abd  Cramping              She  denys  Any  Vaginal  Bleeding  She  denys  Any  Discharge     She  Walks  Upright  With a   Steady  Fluid  Gait            She  Has had  No  Prenatal     Care

## 2012-01-21 ENCOUNTER — Inpatient Hospital Stay (HOSPITAL_COMMUNITY)
Admission: AD | Admit: 2012-01-21 | Discharge: 2012-01-21 | Disposition: A | Discharge status: Home / Self Care | Payer: 59 | Source: Ambulatory Visit | Attending: Obstetrics and Gynecology | Admitting: Obstetrics and Gynecology

## 2012-01-21 ENCOUNTER — Telehealth: Payer: Self-pay | Admitting: *Deleted

## 2012-01-21 ENCOUNTER — Encounter (HOSPITAL_COMMUNITY): Payer: Self-pay | Admitting: *Deleted

## 2012-01-21 DIAGNOSIS — B373 Candidiasis of vulva and vagina: Secondary | ICD-10-CM

## 2012-01-21 DIAGNOSIS — O2390 Unspecified genitourinary tract infection in pregnancy, unspecified trimester: Secondary | ICD-10-CM | POA: Insufficient documentation

## 2012-01-21 DIAGNOSIS — N949 Unspecified condition associated with female genital organs and menstrual cycle: Secondary | ICD-10-CM | POA: Insufficient documentation

## 2012-01-21 DIAGNOSIS — B3731 Acute candidiasis of vulva and vagina: Secondary | ICD-10-CM

## 2012-01-21 DIAGNOSIS — R109 Unspecified abdominal pain: Secondary | ICD-10-CM

## 2012-01-21 LAB — URINALYSIS, ROUTINE W REFLEX MICROSCOPIC
Bilirubin Urine: NEGATIVE
Ketones, ur: NEGATIVE mg/dL
Nitrite: NEGATIVE
Protein, ur: NEGATIVE mg/dL
Urobilinogen, UA: 0.2 mg/dL (ref 0.0–1.0)

## 2012-01-21 LAB — URINE MICROSCOPIC-ADD ON

## 2012-01-21 LAB — WET PREP, GENITAL: Clue Cells Wet Prep HPF POC: NONE SEEN

## 2012-01-21 MED ORDER — FLUCONAZOLE 100 MG PO TABS: 100.0000 mg | ORAL_TABLET | Freq: Every day | ORAL | Status: DC

## 2012-01-21 NOTE — MAU Provider Note (Signed)
History     CSN: [id number]  Arrival date and time: 01/21/12 2022   First Provider Initiated Contact with Patient 01/21/12 2142      Chief Complaint  Patient presents with  . Vaginitis  . Back Pain   HPI [name] is a 36 y.o. G3 P1103 at [redacted]w[redacted]d who presents to MAU today with complaint of vaginal discharge and irritation. The patient states that the discharge is "clumpy and white" and started last Wednesday. She states that she has had itching and irritation and a mild odor. The patient also states that she has some mild pelvic and lower back pain. She denies contractions, vaginal bleeding or fever. She does have some occasional N/V which has been present since earlier in her pregnancy. Patient was seen by Dr. Senaida Ores for previous pregnancies but has an appointment to start care with Dr. Normand Sloop in 2 weeks. She has not yet established care for this pregnancy.   OB History    Grav Para Term Preterm Abortions TAB SAB Ect Mult Living   3 2 1 1  0 0 0 0 1 3      Past Medical History  Diagnosis Date  . No pertinent past medical history     Past Surgical History  Procedure Date  . Laparoscopic gastric banding 2005  . Cesarean section     Family History  Problem Relation Age of Onset  . Diabetes      father's family  . Breast cancer Maternal Grandmother   . Hypertension Mother   . Thyroid cancer      History  Substance Use Topics  . Smoking status: Never Smoker   . Smokeless tobacco: Not on file  . Alcohol Use: No    Allergies: No Known Allergies  Prescriptions prior to admission  Medication Sig Dispense Refill  . Prenat w/o A Vit-FeFum-FePo-FA (CONCEPT OB) 130-92.4-1 MG CAPS Take 1 capsule by mouth daily.  30 capsule  11  . Fe Fum-FePoly-FA-Vit C-Vit B3 (INTEGRA F) 125-1 MG CAPS Take 1 tablet by mouth daily.  30 capsule  8  . promethazine (PHENERGAN) 25 MG suppository Place 1 suppository (25 mg total) rectally every 6 (six) hours as needed for nausea.  12  each  0  . promethazine (PHENERGAN) 25 MG suppository Place 1 suppository (25 mg total) rectally every 6 (six) hours as needed for nausea.  12 each  2    ROS All negative unless otherwise noted in HPI Physical Exam   Blood pressure 146/81, pulse 95, temperature 97.8 F (36.6 C), temperature source Oral, resp. rate 18, height 5\' 5"  (1.651 m), weight 235 lb 9.6 oz (106.867 kg), last menstrual period 10/21/2011.  Physical Exam  Constitutional: She is oriented to person, place, and time. She appears well-developed and well-nourished. No distress.  HENT:  Head: Normocephalic and atraumatic.  Cardiovascular: Normal rate.   Respiratory: Effort normal.  GI: Soft. She exhibits no distension and no mass. There is Tenderness: mild tenderness of the suprapubic region.. There is no rebound and no guarding.  Genitourinary: Cervix exhibits no motion tenderness and no friability. Right adnexum displays no mass and no tenderness. Left adnexum displays no mass and no tenderness. No erythema or tenderness around the vagina. Vaginal discharge (Thick white discharge. No bleeding noted on exam. Wet prep and GC/Chlamydia obtained ) found.  Neurological: She is alert and oriented to person, place, and time.  Skin: Skin is warm and dry. No erythema.  Psychiatric: She has a normal mood and  affect.    MAU Course  Procedures None  Assessment and Plan  A: Yeast infection Round ligament pain  P: Discharge home Rx for diflucan sent to patient's pharmacy Tylenol is ok for abdominal and low back pain GC/Chlamydia culture pending Urine culture pending Patient encouraged to keep appointment to start prenatal care with [facility] Patient may return to MAU if her condition were to change or worsen.  Freddi Starr, PA-C 01/21/2012, 9:43 PM

## 2012-01-21 NOTE — MAU Note (Signed)
Pt with creamy color discharge, itching and slight odor.  Bilat side pain and lower back pain.  Pt G3 P3 at 13.1wks.

## 2012-01-21 NOTE — Telephone Encounter (Signed)
Pt called and states she is having D/C and intense irritation/itching. Pt also states she has a fishy odor. After speaking with Dr/ Linford Arnold left message on pt's vm to call back and schedule a nurse visit so that we can collect a wet prep sample

## 2012-01-22 LAB — URINE CULTURE: Colony Count: 30000

## 2012-01-23 HISTORY — PX: TUBAL LIGATION: SHX77

## 2012-02-05 ENCOUNTER — Inpatient Hospital Stay (HOSPITAL_COMMUNITY)
Admission: AD | Admit: 2012-02-05 | Discharge: 2012-02-05 | Disposition: A | Discharge status: Home / Self Care | Payer: 59 | Source: Ambulatory Visit | Attending: Obstetrics and Gynecology | Admitting: Obstetrics and Gynecology

## 2012-02-05 ENCOUNTER — Telehealth: Payer: Self-pay | Admitting: Obstetrics and Gynecology

## 2012-02-05 ENCOUNTER — Encounter (HOSPITAL_COMMUNITY): Payer: Self-pay | Admitting: *Deleted

## 2012-02-05 DIAGNOSIS — R1031 Right lower quadrant pain: Secondary | ICD-10-CM | POA: Insufficient documentation

## 2012-02-05 DIAGNOSIS — O4700 False labor before 37 completed weeks of gestation, unspecified trimester: Secondary | ICD-10-CM | POA: Insufficient documentation

## 2012-02-05 DIAGNOSIS — R1032 Left lower quadrant pain: Secondary | ICD-10-CM | POA: Insufficient documentation

## 2012-02-05 DIAGNOSIS — O212 Late vomiting of pregnancy: Secondary | ICD-10-CM | POA: Insufficient documentation

## 2012-02-05 LAB — URINALYSIS, ROUTINE W REFLEX MICROSCOPIC
Glucose, UA: NEGATIVE mg/dL
Protein, ur: NEGATIVE mg/dL
Specific Gravity, Urine: 1.02 (ref 1.005–1.030)
Urobilinogen, UA: 0.2 mg/dL (ref 0.0–1.0)

## 2012-02-05 LAB — COMPREHENSIVE METABOLIC PANEL
ALT: 6 U/L (ref 0–35)
AST: 11 U/L (ref 0–37)
Albumin: 2.9 g/dL — ABNORMAL LOW (ref 3.5–5.2)
Alkaline Phosphatase: 48 U/L (ref 39–117)
Potassium: 3.6 mEq/L (ref 3.5–5.1)
Sodium: 134 mEq/L — ABNORMAL LOW (ref 135–145)
Total Protein: 6.6 g/dL (ref 6.0–8.3)

## 2012-02-05 LAB — CBC WITH DIFFERENTIAL/PLATELET
Basophils Relative: 0 % (ref 0–1)
Eosinophils Absolute: 0.4 10*3/uL (ref 0.0–0.7)
MCH: 19.3 pg — ABNORMAL LOW (ref 26.0–34.0)
MCHC: 31.5 g/dL (ref 30.0–36.0)
Neutrophils Relative %: 53 % (ref 43–77)
Platelets: 363 10*3/uL (ref 150–400)
RBC: 4.45 MIL/uL (ref 3.87–5.11)

## 2012-02-05 LAB — URINE MICROSCOPIC-ADD ON

## 2012-02-05 MED ORDER — IBUPROFEN 800 MG PO TABS
800.0000 mg | ORAL_TABLET | Freq: Three times a day (TID) | ORAL | Status: DC
  Administered 2012-02-05: 800 mg via ORAL
  Filled 2012-02-05: qty 1

## 2012-02-05 NOTE — MAU Note (Signed)
Pt states she is having cramping across her lower abdomen and back  That reminds her of the contractions she had with her first child. Pt states pain started this morning

## 2012-02-05 NOTE — Consult Note (Addendum)
DATE: 02/05/2012  Maternity Admissions Unit History and Physical Exam for an Obstetrics Patient  [name] is a 37 y.o. female, [id number], at [redacted]w[redacted]d gestation, who presents for for evaluation of abdominal pain and back pain. She plans to be followed at the [facility] and Gynecology division of [facility].  Her pregnancy has been complicated by a similar episode of abdominal pain 2 weeks ago. She was seen in the emergency department. Urine culture was negative. Gonorrhea and Chlamydia tests were negative. She had an ultrasound in the first trimester that confirmed a viable gestation and confirmed her gestational age by last menstrual period. She says that she now has pain in her abdomen as well as pain in her lower back. She has constipation and occasional rectal bleeding. She has a history of hemorrhoids. She denies nausea and vomiting. She denies a history of kidney stones. See history below.  OB History    Grav Para Term Preterm Abortions TAB SAB Ect Mult Living   3 2 1 1  0 0 0 0 1 3      Past Medical History  Diagnosis Date  . No pertinent past medical history     Prescriptions prior to admission  Medication Sig Dispense Refill  . fluconazole (DIFLUCAN) 100 MG tablet Take 1 tablet (100 mg total) by mouth daily.  1 tablet  0  . Prenat w/o A Vit-FeFum-FePo-FA (CONCEPT OB) 130-92.4-1 MG CAPS Take 1 capsule by mouth daily.  30 capsule  11  . Fe Fum-FePoly-FA-Vit C-Vit B3 (INTEGRA F) 125-1 MG CAPS Take 1 tablet by mouth daily.  30 capsule  8  . promethazine (PHENERGAN) 25 MG suppository Place 1 suppository (25 mg total) rectally every 6 (six) hours as needed for nausea.  12 each  0  . promethazine (PHENERGAN) 25 MG suppository Place 1 suppository (25 mg total) rectally every 6 (six) hours as needed for nausea.  12 each  2    Past Surgical History  Procedure Date  . Laparoscopic gastric banding 2005  . Cesarean section     No Known  Allergies  Family History: family history includes Breast cancer in her maternal grandmother; Diabetes in an unspecified family member; Hypertension in her mother; and Thyroid cancer in an unspecified family member.  Social History:  reports that she has never smoked. She does not have any smokeless tobacco history on file. She reports that she does not drink alcohol or use illicit drugs.  Review of systems: See above.  Admission Physical Exam:  There is no height or weight on file to calculate BMI.  Last menstrual period 10/21/2011.  Afeb, BP: 146/81,134/90 WT: 235 lbs.  HEENT:                 Within normal limits Chest:                   Clear Heart:                    Regular rate and rhythm Abdomen:             Upper abdomen is soft and nontender. She has bilateral tenderness in the lower quadrants. No guarding or rebound. Fetal heart tones 160 beats per minute.  Back:                    No CVA tenderness. She has bilateral tenderness in the hip joints. Extremities:  Grossly normal Neurologic exam: Grossly normal Pelvic exam:         Cervix: Closed. Uterus 16 week size, nontender. Adnexa with no masses. Hard stool in the rectum.  Urinalysis: Negative  CBC    Component Value Date/Time   WBC 9.6 02/05/2012 1637   RBC 4.45 02/05/2012 1637   HGB 8.6* 02/05/2012 1637   HGB 7.6 08/14/2010 1150   HCT 27.3* 02/05/2012 1637   PLT 363 02/05/2012 1637   MCV 61.3* 02/05/2012 1637   MCH 19.3* 02/05/2012 1637   MCHC 31.5 02/05/2012 1637   RDW 17.3* 02/05/2012 1637   LYMPHSABS 3.3 02/05/2012 1637   MONOABS 0.9 02/05/2012 1637   EOSABS 0.4 02/05/2012 1637   BASOSABS 0.0 02/05/2012 1637    CMP     Component Value Date/Time   NA 134* 02/05/2012 1637   K 3.6 02/05/2012 1637   CL 101 02/05/2012 1637   CO2 23 02/05/2012 1637   GLUCOSE 95 02/05/2012 1637   BUN 6 02/05/2012 1637   CREATININE 0.55 02/05/2012 1637   CREATININE 0.65 05/07/2011 1029   CALCIUM 9.5 02/05/2012 1637   PROT 6.6  02/05/2012 1637   ALBUMIN 2.9* 02/05/2012 1637   AST 11 02/05/2012 1637   ALT 6 02/05/2012 1637   ALKPHOS 48 02/05/2012 1637   BILITOT 0.2* 02/05/2012 1637   GFRNONAA >90 02/05/2012 1637   GFRAA >90 02/05/2012 1637    Bedside ultrasound: Single gestation, variable presentation, normal fetal heart motion, normal fluid, posterior grade 1 placenta, no evidence of abruption.  Assessment:  [redacted]w[redacted]d gestation  Abdominal pain and low back pain of uncertain etiology.  Obesity  Constipation  Anemia  Hemorrhoids  Prior cesarean section  History of preterm delivery of twins  Plan:  Ibuprofen 800 mg every 8 hours as needed for pain. Heat may also help her hips feel better.  The patient is scheduled for a new OB examination in our office.  Fiber, fluids, prune juice, and MiraLAX recommended for constipation.  Call for questions or concerns.  The patient will be referred for evaluation of her anemia.  she has had a lap band procedure. She has a known history of anemia.    Patient offered an enema in emergency department but declined.   Miranda Garber V 02/05/2012, 5:10 PM

## 2012-02-05 NOTE — Telephone Encounter (Signed)
Pt 15w 2d c/o burning and cramping in her abdomen and lower back. Pt also c/o headache, nausea, decreased fetal movement, and creamy white discharge. Pt states she has not had a fever. Advised pt that I would speak with AVS, dr on call but pt states I will be unable to reach her for a call back. Advised pt to go to MAU for evaluation. Pt agreeable.

## 2012-02-05 NOTE — MAU Provider Note (Signed)
History     CSN: [id number]  Arrival date and time: 02/05/12 1536  .mau    Chief Complaint  Patient presents with  . Abdominal Pain   HPI  [name] is a 37 y/o female that presents to the MAU with vomiting that began at 1300 on 02/05/12.  The patient states that she has vomited 4x since 1300 when she awoke from her nap.  The patient states that she noticed a wet spot on the couch. She is 33.5 weeks GSA and is G3P3 with preterm labors at 46, 33, and 36 weeks.  The patient could not indicate the color, odor or consistency of the discharge on the couch.  She reports no bleeding.  The patient also reports no headache but occasional hot flashes since 1300 today.  The patient also complains of abdominal cramping in the RLQ and LLQ.  She describes the cramping episodes as intermittent and the pain does not radiate.    The patient denies syncope, dizziness, fever, chills, night sweats, diplopia, SOB, chest pain, dysuria, constipation, selling of the feet, hands, or face.    OB History    Grav Para Term Preterm Abortions TAB SAB Ect Mult Living   3 2 1 1  0 0 0 0 1 3      Past Medical History  Diagnosis Date  . No pertinent past medical history     Past Surgical History  Procedure Date  . Laparoscopic gastric banding 2005  . Cesarean section     Family History  Problem Relation Age of Onset  . Diabetes      father's family  . Breast cancer Maternal Grandmother   . Hypertension Mother   . Thyroid cancer      History  Substance Use Topics  . Smoking status: Never Smoker   . Smokeless tobacco: Not on file  . Alcohol Use: No    Allergies: No Known Allergies  Prescriptions prior to admission  Medication Sig Dispense Refill  . fluconazole (DIFLUCAN) 100 MG tablet Take 1 tablet (100 mg total) by mouth daily.  1 tablet  0  . Prenat w/o A Vit-FeFum-FePo-FA (CONCEPT OB) 130-92.4-1 MG CAPS Take 1 capsule by mouth daily.  30 capsule  11  . Fe Fum-FePoly-FA-Vit C-Vit B3 (INTEGRA  F) 125-1 MG CAPS Take 1 tablet by mouth daily.  30 capsule  8  . promethazine (PHENERGAN) 25 MG suppository Place 1 suppository (25 mg total) rectally every 6 (six) hours as needed for nausea.  12 each  0  . promethazine (PHENERGAN) 25 MG suppository Place 1 suppository (25 mg total) rectally every 6 (six) hours as needed for nausea.  12 each  2    ROS Physical Exam   Heart: RRR, no extra sounds, murmurs noted Lungs: clear and equal breath sounds in all lobes ABD: tenderness to palpation in the RLQ and LLQ.  No rebound tenderness or guarding appreciated P/V: good distal pulses in all extremities without edema Neuro: CN II-XII grossly intact GU: No lesions noted on the exterior labia.  Cervix visualized with an off white mucus discharge of moderate consistency. No pooling. Bimanual exam: cervix 3 cm dilation, long, with moderate thickness.  No adenexal tenderness apppreciated Last menstrual period 10/21/2011.  Physical Exam  MAU Course  Procedures    Assessment and Plan  IV hydration Observe fetal monitor for increased variability Anti nausea medication Observe patient for progress  Evalee Mutton 02/05/2012, 4:12 PM   FHT:  Baseline 130 , moderate variability,  accelerations present, no decelerations Contractions: q 3-4 mins, mild   Labs: Results for orders placed during the hospital encounter of 02/05/12 (from the past 24 hour(s))  URINALYSIS, ROUTINE W REFLEX MICROSCOPIC     Status: Abnormal   Collection Time   02/05/12  1:30 PM      Component Value Range   Color, Urine YELLOW  YELLOW   APPearance CLOUDY (*) CLEAR   Specific Gravity, Urine 1.020  1.005 - 1.030   pH 6.5  5.0 - 8.0   Glucose, UA NEGATIVE  NEGATIVE mg/dL   Hgb urine dipstick NEGATIVE  NEGATIVE   Bilirubin Urine NEGATIVE  NEGATIVE   Ketones, ur NEGATIVE  NEGATIVE mg/dL   Protein, ur NEGATIVE  NEGATIVE mg/dL   Urobilinogen, UA 0.2  0.0 - 1.0 mg/dL   Nitrite NEGATIVE  NEGATIVE   Leukocytes, UA SMALL (*)  NEGATIVE  URINE MICROSCOPIC-ADD ON     Status: Abnormal   Collection Time   02/05/12  1:30 PM      Component Value Range   Squamous Epithelial / LPF MANY (*) RARE   WBC, UA 0-2  <3 WBC/hpf   Bacteria, UA MANY (*) RARE   FERN Neg  Imaging:  NA  MAU Course: No mprovement in UC's w/ IV fluids. Nausea resolved w/ phenergan. Terb given.  UC's less strong, same frequency. Cervix unchanged.    Assessment: 1. Preterm labor without delivery   2. Vaginal discharge in pregnancy   3. Nausea/vomiting in pregnancy     Plan: Discharge home per consult w/ Dr. Gaynell Face Preterm labor precautions and fetal kick counts Pelvic rest until 34 weeks.     Follow-up Information    Follow up with MARSHALL,BERNARD A, MD. (as scheduled)    Contact information:   [address] [place] [address] [phone]       Follow up with THE [facility] [place] MATERNITY ADMISSIONS. (As needed if symptoms worsen)    Contact information:   [address] [id number] mc [place] [address] [phone]          Medication List     As of 02/05/2012  9:17 PM    TAKE these medications         FLINTSTONES GUMMIES PLUS PO   Take 2 capsules by mouth daily.      NIFEdipine 30 MG 24 hr tablet   Commonly known as: PROCARDIA-XL/ADALAT-CC/NIFEDICAL-XL   Take 30 mg by mouth 2 (two) times daily.      ondansetron 8 MG disintegrating tablet   Commonly known as: ZOFRAN-ODT   Take 1 tablet (8 mg total) by mouth every 8 (eight) hours as needed for nausea.      promethazine 25 MG tablet   Commonly known as: PHENERGAN   Take 1 tablet (25 mg total) by mouth every 6 (six) hours as needed for nausea.       [place], [place] 02/05/2012 7:43 PM

## 2012-02-23 ENCOUNTER — Inpatient Hospital Stay (HOSPITAL_COMMUNITY)
Admission: AD | Admit: 2012-02-23 | Discharge: 2012-02-23 | Disposition: A | Discharge status: Home / Self Care | Payer: 59 | Source: Ambulatory Visit | Attending: Obstetrics and Gynecology | Admitting: Obstetrics and Gynecology

## 2012-02-23 ENCOUNTER — Encounter (HOSPITAL_COMMUNITY): Payer: Self-pay | Admitting: *Deleted

## 2012-02-23 DIAGNOSIS — B3731 Acute candidiasis of vulva and vagina: Secondary | ICD-10-CM

## 2012-02-23 DIAGNOSIS — B373 Candidiasis of vulva and vagina: Secondary | ICD-10-CM | POA: Insufficient documentation

## 2012-02-23 DIAGNOSIS — B9689 Other specified bacterial agents as the cause of diseases classified elsewhere: Secondary | ICD-10-CM | POA: Insufficient documentation

## 2012-02-23 DIAGNOSIS — A499 Bacterial infection, unspecified: Secondary | ICD-10-CM | POA: Insufficient documentation

## 2012-02-23 DIAGNOSIS — N949 Unspecified condition associated with female genital organs and menstrual cycle: Secondary | ICD-10-CM | POA: Insufficient documentation

## 2012-02-23 DIAGNOSIS — N760 Acute vaginitis: Secondary | ICD-10-CM | POA: Insufficient documentation

## 2012-02-23 LAB — WET PREP, GENITAL

## 2012-02-23 MED ORDER — NYSTATIN-TRIAMCINOLONE 100000-0.1 UNIT/GM-% EX OINT: TOPICAL_OINTMENT | Freq: Two times a day (BID) | CUTANEOUS | Status: DC

## 2012-02-23 MED ORDER — FLUCONAZOLE 200 MG PO TABS: 200.0000 mg | ORAL_TABLET | Freq: Once | ORAL | Status: DC

## 2012-02-23 MED ORDER — FLUCONAZOLE 200 MG PO TABS
200.0000 mg | ORAL_TABLET | Freq: Once | ORAL | Status: AC
  Administered 2012-02-23: 200 mg via ORAL
  Filled 2012-02-23: qty 1

## 2012-02-23 MED ORDER — METRONIDAZOLE 500 MG PO TABS: 500.0000 mg | ORAL_TABLET | Freq: Two times a day (BID) | ORAL | Status: DC

## 2012-02-23 NOTE — MAU Provider Note (Signed)
History     CSN: [id number]  Arrival date and time: 02/23/12 0458   None     Chief Complaint  Patient presents with  . Vaginitis   HPI 37 y.o. [id number] at [redacted]w[redacted]d with vaginal discharge, itching, irritation. Treated for yeast with one time dose of diflucan 1 month ago. States symptoms improved somewhat, but then came back. Use Monistat last night, woke up with increased irritation and burning this morning.   No prenatal care yet, has new ob scheduled with [facility].     Past Medical History  Diagnosis Date  . No pertinent past medical history     Past Surgical History  Procedure Date  . Laparoscopic gastric banding 2005  . Cesarean section     Family History  Problem Relation Age of Onset  . Diabetes      father's family  . Breast cancer Maternal Grandmother   . Hypertension Mother   . Thyroid cancer      History  Substance Use Topics  . Smoking status: Never Smoker   . Smokeless tobacco: Not on file  . Alcohol Use: No    Allergies: No Known Allergies  Prescriptions prior to admission  Medication Sig Dispense Refill  . Prenatal Vit-Fe Fumarate-FA (PRENATAL MULTIVITAMIN) TABS Take 1 tablet by mouth daily.      Burnis Medin w/o A Vit-FeFum-FePo-FA (CONCEPT OB) 130-92.4-1 MG CAPS Take 1 capsule by mouth daily.  30 capsule  11  . promethazine (PHENERGAN) 25 MG suppository Place 1 suppository (25 mg total) rectally every 6 (six) hours as needed for nausea.  12 each  0  . [DISCONTINUED] Fe Fum-FePoly-FA-Vit C-Vit B3 (INTEGRA F) 125-1 MG CAPS Take 1 tablet by mouth daily.  30 capsule  8  . [DISCONTINUED] fluconazole (DIFLUCAN) 100 MG tablet Take 1 tablet (100 mg total) by mouth daily.  1 tablet  0  . [DISCONTINUED] promethazine (PHENERGAN) 25 MG suppository Place 1 suppository (25 mg total) rectally every 6 (six) hours as needed for nausea.  12 each  2    Review of Systems  Constitutional: Negative.   Respiratory: Negative.   Cardiovascular: Negative.    Gastrointestinal: Negative for nausea, vomiting, abdominal pain, diarrhea and constipation.  Genitourinary: Negative for dysuria, urgency, frequency, hematuria and flank pain.       Negative for vaginal bleeding, cramping, + vaginal discharge   Musculoskeletal: Negative.   Neurological: Negative.   Psychiatric/Behavioral: Negative.    Physical Exam   Blood pressure 127/69, pulse 86, temperature 97.5 F (36.4 C), temperature source Oral, resp. rate 20, height 5' 4.5" (1.638 m), weight 236 lb 6.4 oz (107.23 kg), last menstrual period 10/21/2011.  Physical Exam  Nursing note and vitals reviewed. Constitutional: She is oriented to person, place, and time. She appears well-developed and well-nourished. No distress.  Cardiovascular: Normal rate.   Respiratory: Effort normal.  Genitourinary: Vaginal discharge (yeasty, obscured by presence of monistat ) found.  Musculoskeletal: Normal range of motion.  Neurological: She is alert and oriented to person, place, and time.  Skin: Skin is warm.  Psychiatric: She has a normal mood and affect.    MAU Course  Procedures  Results for orders placed during the hospital encounter of 02/23/12 (from the past 24 hour(s))  WET PREP, GENITAL     Status: Abnormal   Collection Time   02/23/12  5:50 AM      Component Value Range   Yeast Wet Prep HPF POC FEW (*) NONE SEEN   Trich, Wet Prep  NONE SEEN  NONE SEEN   Clue Cells Wet Prep HPF POC MODERATE (*) NONE SEEN   WBC, Wet Prep HPF POC FEW (*) NONE SEEN     Assessment and Plan   1. Yeast vaginitis   2. BV (bacterial vaginosis)       Medication List     As of 02/23/2012  6:06 AM    START taking these medications         metroNIDAZOLE 500 MG tablet   Commonly known as: FLAGYL   Take 1 tablet (500 mg total) by mouth 2 (two) times daily.      nystatin-triamcinolone ointment   Commonly known as: MYCOLOG   Apply topically 2 (two) times daily.      CHANGE how you take these medications          fluconazole 200 MG tablet   Commonly known as: DIFLUCAN   Take 1 tablet (200 mg total) by mouth once.   What changed: - medication strength - dose - how often to take the med      CONTINUE taking these medications         CONCEPT OB 130-92.4-1 MG Caps   Take 1 capsule by mouth daily.      prenatal multivitamin Tabs      promethazine 25 MG suppository   Commonly known as: PHENERGAN   Place 1 suppository (25 mg total) rectally every 6 (six) hours as needed for nausea.      STOP taking these medications         INTEGRA F 125-1 MG Caps          Where to get your medications    These are the prescriptions that you need to pick up. We sent them to a specific pharmacy, so you will need to go there to get them.   [id number] [place], [place] - [address]    [address] [place] [address]    Phone: [phone]        fluconazole 200 MG tablet   metroNIDAZOLE 500 MG tablet   nystatin-triamcinolone ointment            Follow-up Information    Follow up with [facility] (as scheduled)    Contact information:   [address] [place] [address] [phone]           Zannie Locastro 02/23/2012, 6:06 AM

## 2012-02-23 NOTE — MAU Provider Note (Signed)
Attestation of Attending Supervision of Advanced Practitioner (CNM/NP): Evaluation and management procedures were performed by the Advanced Practitioner under my supervision and collaboration.  I have reviewed the Advanced Practitioner's note and chart, and I agree with the management and plan.  Zhane Donlan 02/23/2012 6:55 AM

## 2012-02-23 NOTE — MAU Note (Signed)
Was seen in December and had yeast infection. Treated with Diflucan and don't think it ever went away. Now having a lot of white watery d/c. Used Monistat 1 Friday night and awoke this am with a lot of vaginal burning.

## 2012-02-23 NOTE — Progress Notes (Signed)
Written and verbal d/c instructions given and understanding voiced. To keep new appt with DrCousins as scheduled.

## 2012-02-23 NOTE — Progress Notes (Signed)
Only wet prep obtained and sent to lab

## 2012-03-03 ENCOUNTER — Encounter: Payer: 59 | Admitting: Obstetrics and Gynecology

## 2012-04-19 ENCOUNTER — Inpatient Hospital Stay (HOSPITAL_COMMUNITY)
Admission: AD | Admit: 2012-04-19 | Discharge: 2012-04-19 | Disposition: A | Discharge status: Home / Self Care | Payer: 59 | Source: Ambulatory Visit | Attending: Obstetrics and Gynecology | Admitting: Obstetrics and Gynecology

## 2012-04-19 ENCOUNTER — Encounter (HOSPITAL_COMMUNITY): Payer: Self-pay | Admitting: *Deleted

## 2012-04-19 DIAGNOSIS — D649 Anemia, unspecified: Secondary | ICD-10-CM | POA: Insufficient documentation

## 2012-04-19 HISTORY — DX: Anemia, unspecified: D64.9

## 2012-04-19 HISTORY — DX: Gastro-esophageal reflux disease without esophagitis: K21.9

## 2012-04-19 MED ORDER — SODIUM CHLORIDE 0.9 % IV SOLN
INTRAVENOUS | Status: DC
Start: 2012-04-19 — End: 2012-04-19
  Administered 2012-04-19: 100 mL via INTRAVENOUS

## 2012-04-19 MED ORDER — FERUMOXYTOL INJECTION 510 MG/17 ML
510.0000 mg | Freq: Once | INTRAVENOUS | Status: AC
  Administered 2012-04-19: 510 mg via INTRAVENOUS
  Filled 2012-04-19: qty 17

## 2012-04-19 NOTE — MAU Note (Signed)
Pt here for Iron infusion 

## 2012-05-03 ENCOUNTER — Other Ambulatory Visit: Payer: Self-pay

## 2012-05-08 ENCOUNTER — Ambulatory Visit (HOSPITAL_COMMUNITY)
Admission: RE | Admit: 2012-05-08 | Discharge: 2012-05-08 | Disposition: A | Discharge status: Home / Self Care | Payer: 59 | Source: Ambulatory Visit | Attending: Obstetrics and Gynecology | Admitting: Obstetrics and Gynecology

## 2012-05-09 NOTE — ED Notes (Signed)
Diabetes Education:  Jamie Mccarthy seen today for GDM Education>  EDD: 07/29/2012.  Currently at 28 weeks. History of HTN and twins with last delivery.  3 hr OGTT: Fasting=104, 1 Hr= 204, 2 Hr= 199, 3 Hr= 136.  Pre-pregnancy weight was at 236 lb. Today instruction included review of self-care measures, exercise, hypoglycemia, carbohydrate counting, carbohydrate restricted diet, and diet prescription.  Provided handout "Nutritio, Diabetes and Pregnancy".  Provided an [employer] Kit Lot: [id number] Exp: 05/21/2013 and instructed in its use.  Instructed to monitor fasting blood glucose and 1 hour after the first bite of each meal, record glucose levels, and to take meter and glucose log to all MD/clinic appointments.  On return demonstration, her glucose level at 5:00 PM was 101 mg/dl.  She has my card and is to call with questions or needs.  Maggie Murl Zogg, RN, RD, CDE

## 2012-05-12 ENCOUNTER — Encounter: Payer: Self-pay | Admitting: *Deleted

## 2012-07-13 ENCOUNTER — Other Ambulatory Visit (HOSPITAL_COMMUNITY): Payer: Self-pay | Admitting: Advanced Practice Midwife

## 2012-07-13 ENCOUNTER — Encounter (HOSPITAL_COMMUNITY): Payer: Self-pay | Admitting: *Deleted

## 2012-07-13 ENCOUNTER — Inpatient Hospital Stay (HOSPITAL_COMMUNITY): Payer: 59

## 2012-07-13 ENCOUNTER — Inpatient Hospital Stay (HOSPITAL_COMMUNITY)
Admission: AD | Admit: 2012-07-13 | Discharge: 2012-07-13 | Disposition: A | Discharge status: Home / Self Care | Payer: 59 | Source: Ambulatory Visit | Attending: Obstetrics and Gynecology | Admitting: Obstetrics and Gynecology

## 2012-07-13 DIAGNOSIS — N760 Acute vaginitis: Secondary | ICD-10-CM

## 2012-07-13 DIAGNOSIS — B9689 Other specified bacterial agents as the cause of diseases classified elsewhere: Secondary | ICD-10-CM | POA: Insufficient documentation

## 2012-07-13 DIAGNOSIS — B957 Other staphylococcus as the cause of diseases classified elsewhere: Secondary | ICD-10-CM

## 2012-07-13 DIAGNOSIS — N949 Unspecified condition associated with female genital organs and menstrual cycle: Secondary | ICD-10-CM | POA: Insufficient documentation

## 2012-07-13 DIAGNOSIS — O2390 Unspecified genitourinary tract infection in pregnancy, unspecified trimester: Secondary | ICD-10-CM | POA: Insufficient documentation

## 2012-07-13 DIAGNOSIS — R109 Unspecified abdominal pain: Secondary | ICD-10-CM | POA: Insufficient documentation

## 2012-07-13 DIAGNOSIS — A499 Bacterial infection, unspecified: Secondary | ICD-10-CM | POA: Insufficient documentation

## 2012-07-13 LAB — WET PREP, GENITAL: Yeast Wet Prep HPF POC: NONE SEEN

## 2012-07-13 IMAGING — US US FETAL BPP W/O NONSTRESS
1 series · 11 of 11 positions shown · non-contrast
Comparison: none

CLINICAL DATA: Tachycardia

[Series 1: us fetal bpp w/o nonstress · non-contrast · 11 acquisitions, 11 frames shown]
[im 1/11]
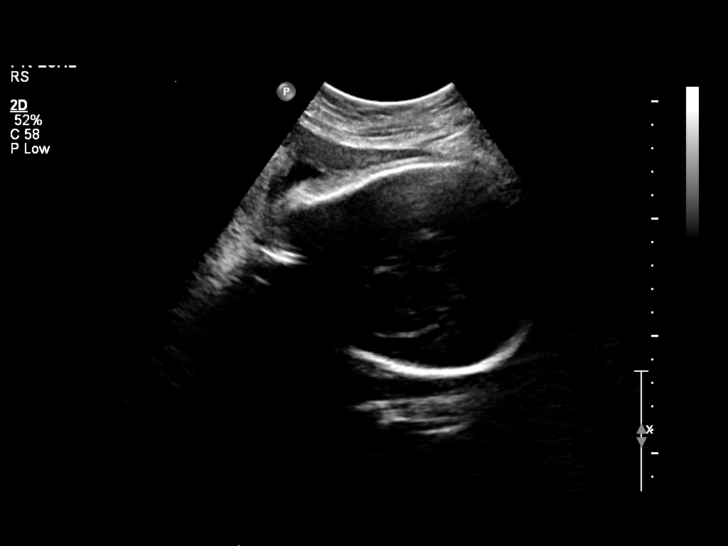
[im 2/11]
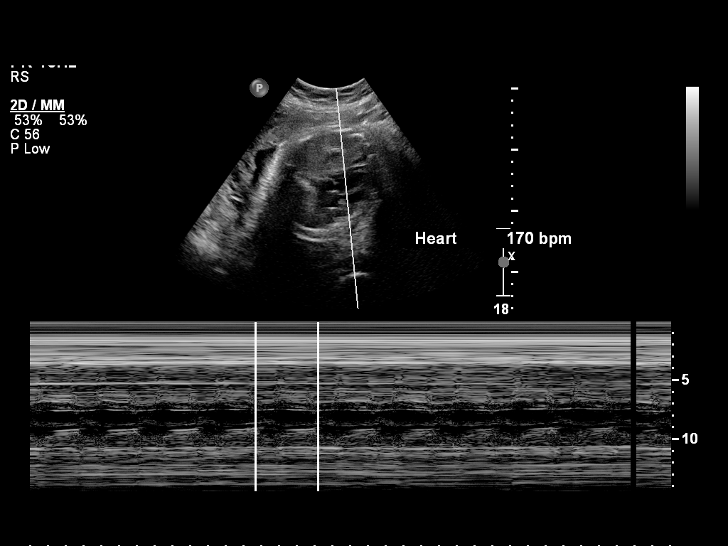
[im 3/11]
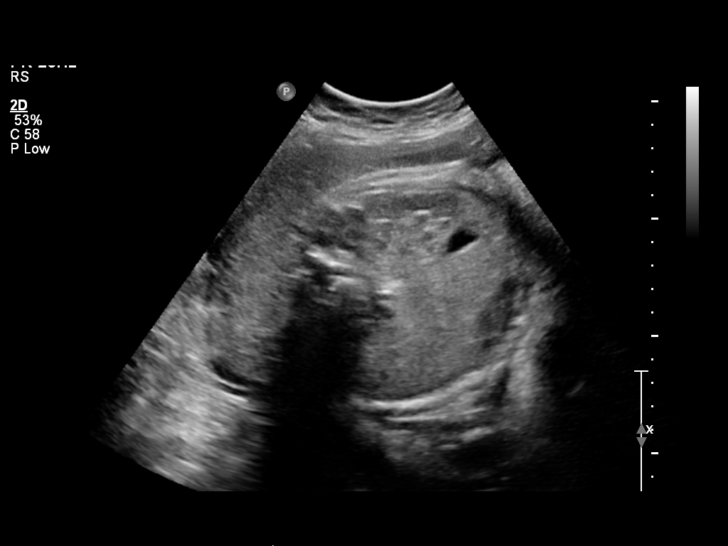
[im 4/11]
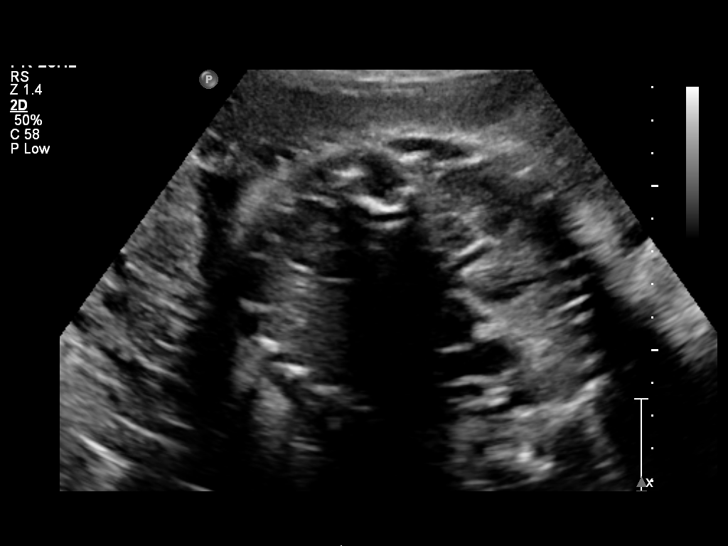
[im 5/11]
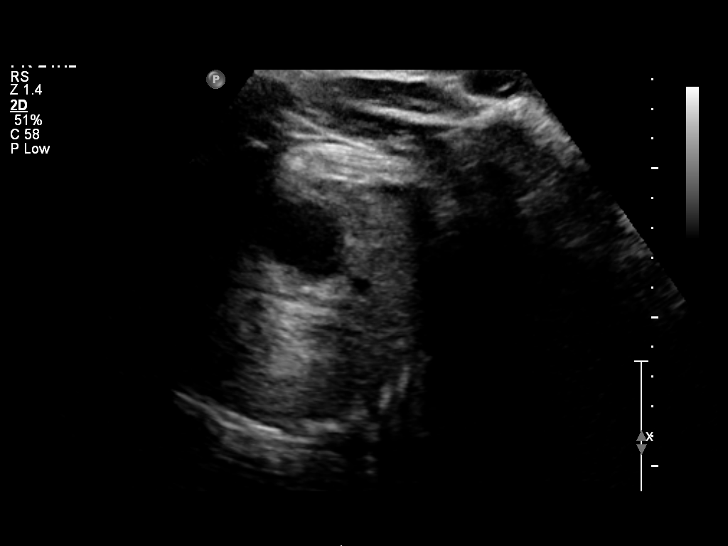
[im 6/11]
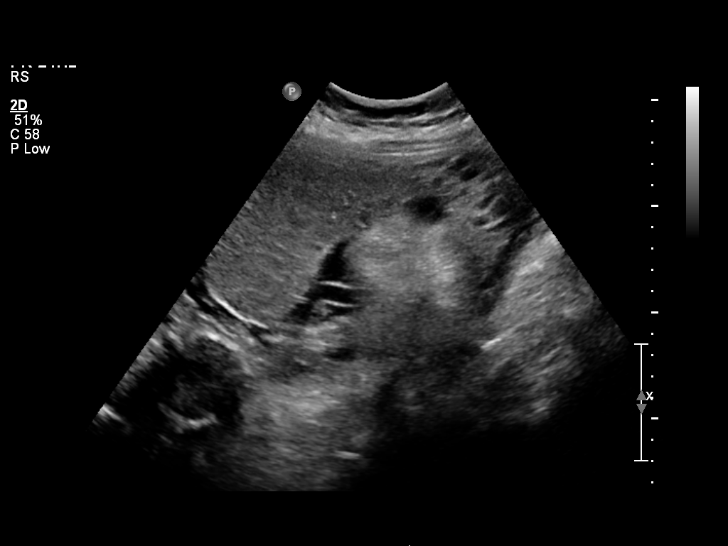
[im 7/11]
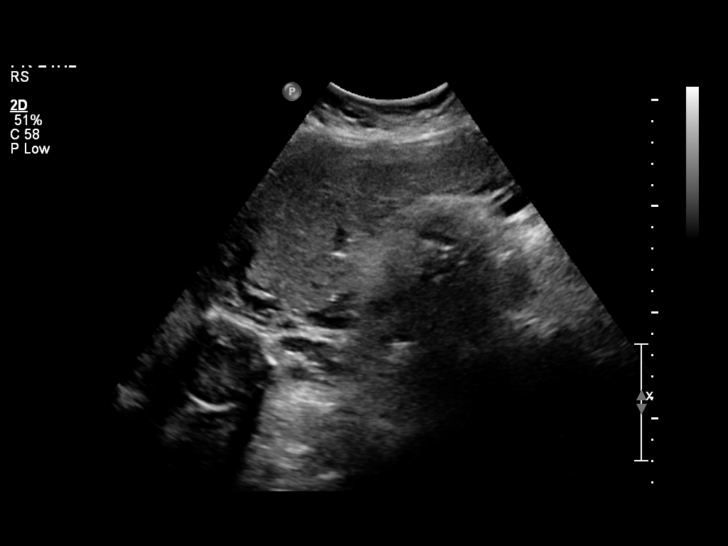
[im 8/11]
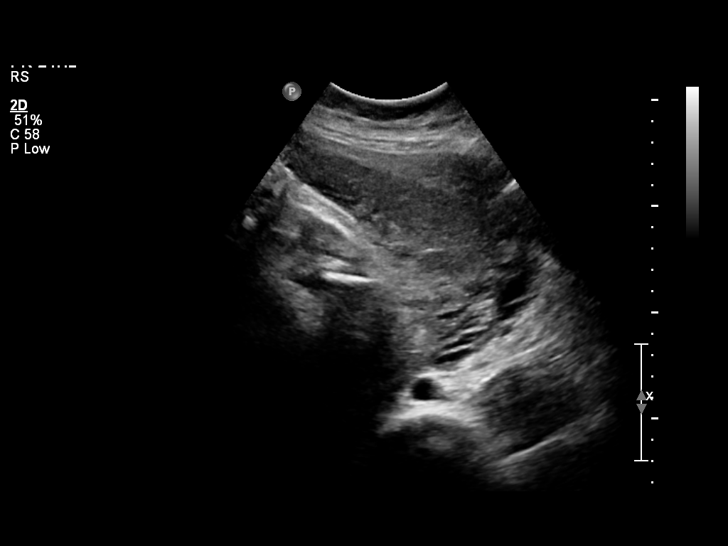
[im 9/11]
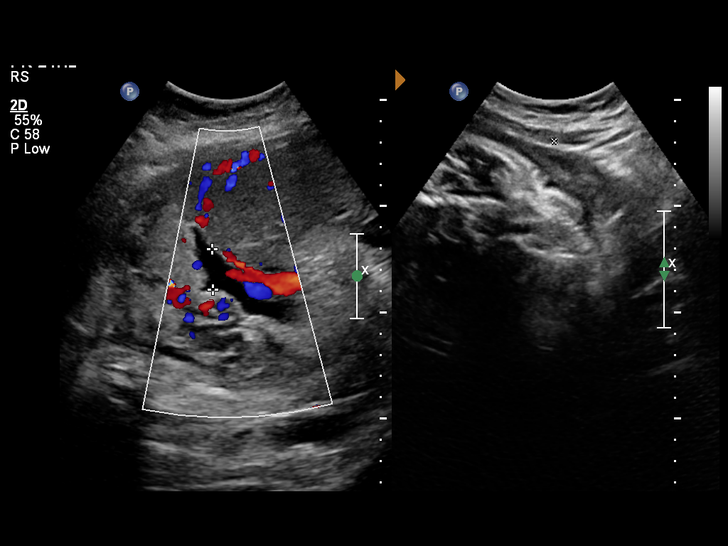
[im 10/11]
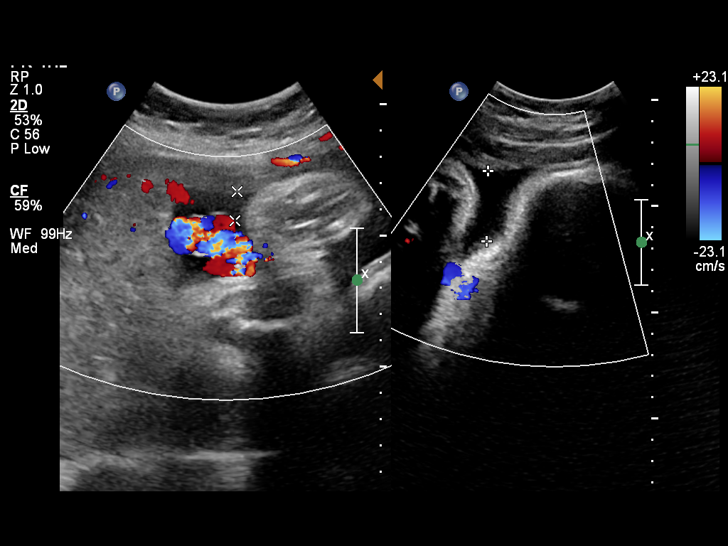
[im 11/11]
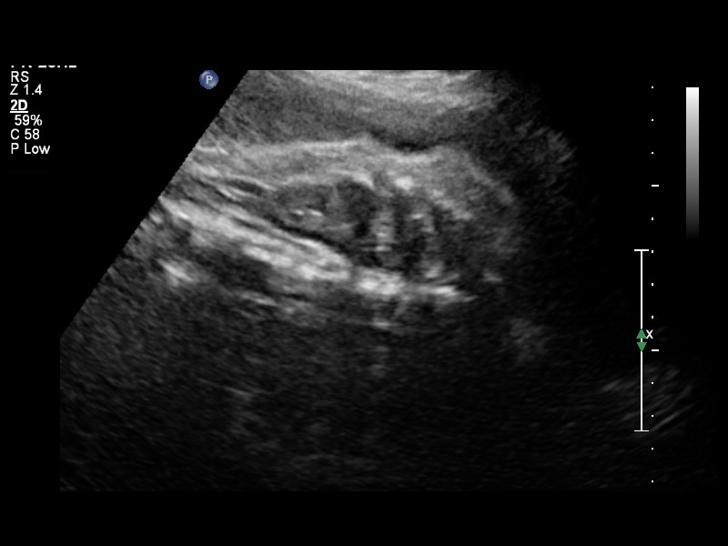

[11 of 11 positions shown; findings below may reference images not displayed]

LIMITED OBSTETRIC ULTRASOUND

Number of Fetuses: 1
Heart Rate: 170 bpm
Movement: Identified

Presentation: Cephalic
Placental Location: Fundal
Previa: None
Amniotic Fluid (Subjective): Decreased

Vertical Pocket      AFI 6.3 cm  (5%ile = 7.3 cm; 95%ile = 23.9 cm)

Biophysical profile:, elapsed time equals 25 minutes

A total score of [DATE]
IMPRESSION: 1..  Fetal heart rate 170 beats per minute.
2.  Decreased amniotic fluid.
3.  Biophysical profile [DATE]

Recommend followup with non-emergent complete OB 14+ wk US
examination for fetal biometric evaluation and anatomic survey if
not already performed.

## 2012-07-13 MED ORDER — METRONIDAZOLE 500 MG PO TABS: 500.0000 mg | ORAL_TABLET | Freq: Two times a day (BID) | ORAL | Status: DC

## 2012-07-13 MED ORDER — FLUCONAZOLE 150 MG PO TABS: 150.0000 mg | ORAL_TABLET | Freq: Once | ORAL | Status: DC

## 2012-07-13 NOTE — MAU Note (Signed)
Seen in office THurs and told provider about vag d/c. Treated with Diflucan and is not getting better. Getting worse. Having a lot of vag irritation. Some abdominal cramping

## 2012-07-13 NOTE — Progress Notes (Signed)
Thressa Sheller CNM notified of pt's admission and c/o of green/yellow vag d/c. Order for wet prep

## 2012-07-13 NOTE — MAU Provider Note (Signed)
History     CSN: [id number]  Arrival date and time: 07/13/12 0421   None     Chief Complaint  Patient presents with  . Vaginal Discharge  . Abdominal Cramping   Vaginal Discharge The patient's primary symptoms include a vaginal discharge.  Abdominal Cramping    Jamie Mccarthy is a 37 y.o. [id number] at [redacted]w[redacted]d who presents today with vaginal discharge and ithcing and irritation. She states that she was seen in the office and given diflucan, but that she is not getting better. She feels it has gotten worse. Denies any LOF or VB and confirms fetal movement. Denies any UCs.   Past Medical History  Diagnosis Date  . No pertinent past medical history   . Anemia   . GERD (gastroesophageal reflux disease)     Past Surgical History  Procedure Laterality Date  . Laparoscopic gastric banding  2005  . Cesarean section      Family History  Problem Relation Age of Onset  . Diabetes      father's family  . Breast cancer Maternal Grandmother   . Hypertension Mother   . Thyroid cancer      History  Substance Use Topics  . Smoking status: Never Smoker   . Smokeless tobacco: Not on file  . Alcohol Use: No    Allergies: No Known Allergies  Facility-administered medications prior to admission  Medication Dose Route Frequency Provider Last Rate Last Dose  . cyanocobalamin ((VITAMIN B-12)) injection 1,000 mcg  1,000 mcg Intramuscular Q14 Days Agapito Games, MD   1,000 mcg at 10/04/10 6578   Prescriptions prior to admission  Medication Sig Dispense Refill  . Prenatal Vit-Fe Fumarate-FA (PRENATAL MULTIVITAMIN) TABS Take 1 tablet by mouth daily.      . promethazine (PHENERGAN) 25 MG suppository Place 1 suppository (25 mg total) rectally every 6 (six) hours as needed for nausea.  12 each  0    Review of Systems  Genitourinary: Positive for vaginal discharge.   Physical Exam   Blood pressure 134/86, pulse 82, temperature 98.4 F (36.9 C), resp. rate 18, height 5' 4.5"  (1.638 m), weight 105.688 kg (233 lb), last menstrual period 10/21/2011, SpO2 100.00%.  Physical Exam  Nursing note and vitals reviewed. Constitutional: She is oriented to person, place, and time. She appears well-developed and well-nourished. No distress.  Cardiovascular: Normal rate.   Respiratory: Effort normal.  GI: Soft. There is no tenderness.  Genitourinary:  Cervical exam deferred 2/2 vulvar discomfort. No c/o of any labor signs.   Neurological: She is alert and oriented to person, place, and time.  Skin: Skin is warm and dry.  Psychiatric: She has a normal mood and affect.   NST: 150, moderate with prolonged accels to the 180's and visibly active fetus.  Toco: random UC.  MAU Course  Procedures  Results for orders placed during the hospital encounter of 07/13/12 (from the past 24 hour(s))  WET PREP, GENITAL     Status: Abnormal   Collection Time    07/13/12  5:48 AM      Result Value Range   Yeast Wet Prep HPF POC NONE SEEN  NONE SEEN   Trich, Wet Prep NONE SEEN  NONE SEEN   Clue Cells Wet Prep HPF POC FEW (*) NONE SEEN   WBC, Wet Prep HPF POC MODERATE (*) NONE SEEN     Assessment and Plan   1. BV (bacterial vaginosis)    RX: flagyl 500mg  po BID x 7  days #14 0RF Diflucan 150mg  for after flagyl if yeast develops #1, 0RF 3rd trimester danger signs Labor precautions Fetal kick counts FU with the office as scheduled  Tawnya Crook 07/13/2012, 6:23 AM

## 2012-07-13 NOTE — Progress Notes (Signed)
Thressa Sheller CNM notified of wet prep results. Will see pt

## 2012-07-14 ENCOUNTER — Encounter (HOSPITAL_COMMUNITY): Payer: Self-pay

## 2012-07-14 ENCOUNTER — Encounter (HOSPITAL_COMMUNITY)
Admission: RE | Admit: 2012-07-14 | Discharge: 2012-07-14 | Disposition: A | Discharge status: Home / Self Care | Payer: 59 | Source: Ambulatory Visit | Attending: Obstetrics and Gynecology | Admitting: Obstetrics and Gynecology

## 2012-07-14 HISTORY — DX: Gestational diabetes mellitus in pregnancy, unspecified control: O24.419

## 2012-07-14 HISTORY — DX: Other specified postprocedural states: Z98.890

## 2012-07-14 HISTORY — DX: Nausea with vomiting, unspecified: R11.2

## 2012-07-14 LAB — BASIC METABOLIC PANEL
BUN: <3 mg/dL — ABNORMAL LOW (ref 6–23)
CO2: 24 mEq/L (ref 19–32)
Chloride: 102 mEq/L (ref 96–112)
GFR calc non Af Amer: >90 mL/min (ref >90)
Glucose, Bld: 87 mg/dL (ref 70–99)
Potassium: 3.6 mEq/L (ref 3.5–5.1)
Sodium: 137 mEq/L (ref 135–145)

## 2012-07-14 LAB — TYPE AND SCREEN: Antibody Screen: NEGATIVE

## 2012-07-14 LAB — CBC
HCT: 28.1 % — ABNORMAL LOW (ref 36.0–46.0)
Hemoglobin: 8.7 g/dL — ABNORMAL LOW (ref 12.0–15.0)
MCHC: 31 g/dL (ref 30.0–36.0)
RBC: 4.52 MIL/uL (ref 3.87–5.11)

## 2012-07-14 LAB — RPR: RPR Ser Ql: NONREACTIVE

## 2012-07-14 NOTE — Patient Instructions (Addendum)
   Your procedure is scheduled on: Wednesday 07/16/12 Enter through the Main Entrance of [facility] at: 7:30 am  Pick up the phone at the desk and dial [phone] and inform us of your arrival.  Please call this number if you have any problems the morning of surgery: [phone]  Remember: Do not eat any solid foods or drink any liquids after midnight on: Wednesday 6/25  Do not wear jewelry, make-up, or FINGER nail polish No metal in your hair or on your body. Do not wear lotions, powders, perfumes. You may wear deodorant.  Please use your CHG wash as directed prior to surgery.  Do not shave anywhere for at least 12 hours prior to first CHG shower.  Do not bring valuables to the hospital. Contacts, Dentures and Partial Plates may not be worn to OR  Leave suitcase in the car. After Surgery it may be brought to your room.  For patients being admitted to the hospital, checkout time is 11:00am the day of discharge.

## 2012-07-14 NOTE — Pre-Procedure Instructions (Signed)
Patient was not instructed to take Flagyl the morning of surgery because she said she throws up if she takes anything on an empty stomach. She will contact her MD and let her know she will not have taken it when she arrives DOS.

## 2012-07-15 NOTE — H&P (Signed)
Jamie Mccarthy is a 37 y.o. female [phone] at 38+weeks (EDD 07/27/12 by LMP c/w 8 week [place]) presenting for scheduled repeat c-section with a prior c-section for preterm twins that required "T-ing" of her uterine incision and labor contraindicated.  Prenatal care began late at 23 weeks and patient received 17-hydroxyprogesterone weekly from that point until 36 weeks given her preterm delivery history.  She had several other issues with pregnancy:  A history of HSV, on suppression, Hgb AS trait but husband is not a carrier per patient.  She also was diagnosed with gestational diabetes at 29 weeks, but has been well controlled on diet.  She has a fibroid uterus with the largest measuring 5 cm.  She also has a history of chronic anemia, probably related to a prior lap band procedure and decreased absorption.    Maternal Medical History:  Contractions: Frequency: rare.   Perceived severity is mild.    Fetal activity: Perceived fetal activity is normal.    Prenatal Complications - Diabetes: gestational. Diabetes is managed by diet.      OB History   Grav Para Term Preterm Abortions TAB SAB Ect Mult Living   3 2 1 1  0 0 0 0 1 3    2010 NSVD 7#2oz 2012 LTCS Twins at 30 weeks 3#3oz and 3#8oz (t'd incision)  Past Medical History  Diagnosis Date  . No pertinent past medical history   . PONV (postoperative nausea and vomiting)     has had PONV with previous c/s and also lap band surgery 2004  . GERD (gastroesophageal reflux disease)     diet controlled  . Anemia     Has sickle cell trait  . Gestational diabetes     with current pregnancy only-diet controlled   Past Surgical History  Procedure Laterality Date  . Laparoscopic gastric banding  2005  . Cesarean section    . Gestational diabetes      with current pregnancy only- diet controlled   Family History: family history includes Breast cancer in her maternal grandmother; Diabetes in an unspecified family member; Hypertension in her mother;  and Thyroid cancer in an unspecified family member. Social History:  reports that she has never smoked. She does not have any smokeless tobacco history on file. She reports that she does not drink alcohol or use illicit drugs.   Prenatal Transfer Tool  Maternal Diabetes: Yes:  Diabetes Type:  Diet controlled Genetic Screening: too late to care Maternal Ultrasounds/Referrals: Normal Fetal Ultrasounds or other Referrals:  None Maternal Substance Abuse:  No Significant Maternal Medications:  Meds include: Progesterone, valtrex Significant Maternal Lab Results:  None Other Comments:  None  ROS    Last menstrual period 10/21/2011. Maternal Exam:  Uterine Assessment: Contraction strength is mild.  Contraction frequency is rare.   Abdomen: Patient reports no abdominal tenderness. Surgical scars: low transverse.   Fetal presentation: vertex  Introitus: Normal vulva. Normal vagina.    Physical Exam  Constitutional: She is oriented to person, place, and time. She appears well-developed and well-nourished.  Cardiovascular: Normal rate and regular rhythm.   Respiratory: Effort normal and breath sounds normal.  GI: Soft.  Genitourinary: Vagina normal and uterus normal.  Neurological: She is alert and oriented to person, place, and time.  Psychiatric: She has a normal mood and affect. Her behavior is normal.    Prenatal labs: ABO, Rh: --/--/O POS (06/23 1130) Antibody: NEG (06/23 1130) Rubella:  Immune RPR: NON REACTIVE (06/23 1155)  HBsAg:  Negative HIV:   Negative GBS:   Negative One hour GTT 203 Three hour GTT 104/204/199/136   Assessment/Plan: Patient was counseled regarding the risks and benefits of C-section and the procedure was reviewed in detail. Risks of bleeding, infection, and possible damage to bowel and bladder were reviewed, The patient would accept a blood transfusion if needed. She desires to proceed.   Oliver Pila 07/15/2012, 9:02 PM

## 2012-07-16 ENCOUNTER — Encounter (HOSPITAL_COMMUNITY): Payer: Self-pay | Admitting: Anesthesiology

## 2012-07-16 ENCOUNTER — Inpatient Hospital Stay (HOSPITAL_COMMUNITY): Payer: 59 | Admitting: Anesthesiology

## 2012-07-16 ENCOUNTER — Inpatient Hospital Stay (HOSPITAL_COMMUNITY)
Admission: RE | Admit: 2012-07-16 | Discharge: 2012-07-19 | DRG: 766 | Disposition: A | Discharge status: Home / Self Care | Payer: 59 | Source: Ambulatory Visit | Attending: Obstetrics and Gynecology | Admitting: Obstetrics and Gynecology

## 2012-07-16 ENCOUNTER — Encounter (HOSPITAL_COMMUNITY)
Admission: RE | Disposition: A | Discharge status: Home / Self Care | Payer: Self-pay | Source: Ambulatory Visit | Attending: Obstetrics and Gynecology

## 2012-07-16 DIAGNOSIS — E538 Deficiency of other specified B group vitamins: Secondary | ICD-10-CM

## 2012-07-16 DIAGNOSIS — O9902 Anemia complicating childbirth: Secondary | ICD-10-CM | POA: Diagnosis present

## 2012-07-16 DIAGNOSIS — D649 Anemia, unspecified: Secondary | ICD-10-CM | POA: Diagnosis present

## 2012-07-16 DIAGNOSIS — O34599 Maternal care for other abnormalities of gravid uterus, unspecified trimester: Secondary | ICD-10-CM | POA: Diagnosis present

## 2012-07-16 DIAGNOSIS — D4959 Neoplasm of unspecified behavior of other genitourinary organ: Secondary | ICD-10-CM | POA: Diagnosis present

## 2012-07-16 DIAGNOSIS — D259 Leiomyoma of uterus, unspecified: Secondary | ICD-10-CM | POA: Diagnosis present

## 2012-07-16 DIAGNOSIS — O34219 Maternal care for unspecified type scar from previous cesarean delivery: Principal | ICD-10-CM | POA: Diagnosis present

## 2012-07-16 DIAGNOSIS — O09529 Supervision of elderly multigravida, unspecified trimester: Secondary | ICD-10-CM | POA: Diagnosis present

## 2012-07-16 DIAGNOSIS — O99814 Abnormal glucose complicating childbirth: Secondary | ICD-10-CM | POA: Diagnosis present

## 2012-07-16 DIAGNOSIS — Z98891 History of uterine scar from previous surgery: Secondary | ICD-10-CM

## 2012-07-16 DIAGNOSIS — O99844 Bariatric surgery status complicating childbirth: Secondary | ICD-10-CM | POA: Diagnosis present

## 2012-07-16 DIAGNOSIS — Z302 Encounter for sterilization: Secondary | ICD-10-CM

## 2012-07-16 HISTORY — PX: TUBAL LIGATION: SHX77

## 2012-07-16 HISTORY — PX: BILATERAL SALPINGECTOMY: SHX5743

## 2012-07-16 LAB — GLUCOSE, CAPILLARY: Glucose-Capillary: 95 mg/dL (ref 70–99)

## 2012-07-16 LAB — OB RESULTS CONSOLE HIV ANTIBODY (ROUTINE TESTING): HIV: NONREACTIVE

## 2012-07-16 LAB — OB RESULTS CONSOLE RUBELLA ANTIBODY, IGM: Rubella: UNDETERMINED

## 2012-07-16 SURGERY — Surgical Case
Anesthesia: Spinal

## 2012-07-16 MED ORDER — KETOROLAC TROMETHAMINE 30 MG/ML IJ SOLN
30.0000 mg | Freq: Four times a day (QID) | INTRAMUSCULAR | Status: AC | PRN
  Administered 2012-07-16: 30 mg via INTRAVENOUS
  Filled 2012-07-16: qty 1

## 2012-07-16 MED ORDER — HYDROMORPHONE HCL PF 1 MG/ML IJ SOLN
INTRAMUSCULAR | Status: AC
  Administered 2012-07-16: 1 mg via INTRAVENOUS
  Filled 2012-07-16: qty 1

## 2012-07-16 MED ORDER — NALOXONE HCL 1 MG/ML IJ SOLN
1.0000 ug/kg/h | INTRAVENOUS | Status: DC | PRN
  Filled 2012-07-16: qty 2

## 2012-07-16 MED ORDER — WITCH HAZEL-GLYCERIN EX PADS: 1.0000 "application " | MEDICATED_PAD | CUTANEOUS | Status: DC | PRN

## 2012-07-16 MED ORDER — PRENATAL MULTIVITAMIN CH
1.0000 | ORAL_TABLET | Freq: Every day | ORAL | Status: DC
  Filled 2012-07-16: qty 1

## 2012-07-16 MED ORDER — IBUPROFEN 600 MG PO TABS
600.0000 mg | ORAL_TABLET | Freq: Four times a day (QID) | ORAL | Status: DC
  Administered 2012-07-17 – 2012-07-19 (×5): 600 mg via ORAL
  Filled 2012-07-16 (×7): qty 1

## 2012-07-16 MED ORDER — OXYTOCIN 10 UNIT/ML IJ SOLN
INTRAMUSCULAR | Status: AC
  Filled 2012-07-16: qty 4

## 2012-07-16 MED ORDER — ZOLPIDEM TARTRATE 5 MG PO TABS: 5.0000 mg | ORAL_TABLET | Freq: Every evening | ORAL | Status: DC | PRN

## 2012-07-16 MED ORDER — NALBUPHINE SYRINGE 5 MG/0.5 ML
INJECTION | INTRAMUSCULAR | Status: AC
  Administered 2012-07-16: 10 mg via SUBCUTANEOUS
  Filled 2012-07-16: qty 1

## 2012-07-16 MED ORDER — OXYCODONE-ACETAMINOPHEN 5-325 MG PO TABS
1.0000 | ORAL_TABLET | ORAL | Status: DC | PRN
  Administered 2012-07-17: 1 via ORAL
  Filled 2012-07-16: qty 1

## 2012-07-16 MED ORDER — NALOXONE HCL 0.4 MG/ML IJ SOLN: 0.4000 mg | INTRAMUSCULAR | Status: DC | PRN

## 2012-07-16 MED ORDER — PHENYLEPHRINE 40 MCG/ML (10ML) SYRINGE FOR IV PUSH (FOR BLOOD PRESSURE SUPPORT)
PREFILLED_SYRINGE | INTRAVENOUS | Status: AC
  Filled 2012-07-16: qty 10

## 2012-07-16 MED ORDER — ONDANSETRON HCL 4 MG PO TABS
4.0000 mg | ORAL_TABLET | ORAL | Status: DC | PRN
  Administered 2012-07-17 (×2): 4 mg via ORAL
  Filled 2012-07-16 (×2): qty 1

## 2012-07-16 MED ORDER — LACTATED RINGERS IV SOLN
INTRAVENOUS | Status: DC
  Administered 2012-07-16 (×3): via INTRAVENOUS

## 2012-07-16 MED ORDER — MEPERIDINE HCL 25 MG/ML IJ SOLN: 6.2500 mg | INTRAMUSCULAR | Status: DC | PRN

## 2012-07-16 MED ORDER — DIPHENHYDRAMINE HCL 50 MG/ML IJ SOLN
12.5000 mg | INTRAMUSCULAR | Status: DC | PRN
  Administered 2012-07-16: 12.5 mg via INTRAVENOUS
  Filled 2012-07-16: qty 1

## 2012-07-16 MED ORDER — PHENYLEPHRINE 40 MCG/ML (10ML) SYRINGE FOR IV PUSH (FOR BLOOD PRESSURE SUPPORT)
PREFILLED_SYRINGE | INTRAVENOUS | Status: AC
  Filled 2012-07-16: qty 5

## 2012-07-16 MED ORDER — OXYTOCIN 10 UNIT/ML IJ SOLN
40.0000 [IU] | INTRAVENOUS | Status: DC | PRN
  Administered 2012-07-16: 40 [IU] via INTRAVENOUS

## 2012-07-16 MED ORDER — MORPHINE SULFATE (PF) 0.5 MG/ML IJ SOLN
INTRAMUSCULAR | Status: DC | PRN
  Administered 2012-07-16: .15 mg via EPIDURAL

## 2012-07-16 MED ORDER — METRONIDAZOLE 500 MG PO TABS
500.0000 mg | ORAL_TABLET | Freq: Two times a day (BID) | ORAL | Status: DC
  Administered 2012-07-16 – 2012-07-17 (×2): 500 mg via ORAL
  Filled 2012-07-16 (×7): qty 1

## 2012-07-16 MED ORDER — SODIUM CHLORIDE 0.9 % IJ SOLN: 3.0000 mL | INTRAMUSCULAR | Status: DC | PRN

## 2012-07-16 MED ORDER — NALBUPHINE HCL 10 MG/ML IJ SOLN
5.0000 mg | INTRAMUSCULAR | Status: DC | PRN
  Administered 2012-07-16: 10 mg via SUBCUTANEOUS
  Filled 2012-07-16: qty 1

## 2012-07-16 MED ORDER — NALBUPHINE HCL 10 MG/ML IJ SOLN
5.0000 mg | INTRAMUSCULAR | Status: DC | PRN
  Administered 2012-07-17: 10 mg via INTRAVENOUS
  Filled 2012-07-16 (×3): qty 1

## 2012-07-16 MED ORDER — KETOROLAC TROMETHAMINE 60 MG/2ML IM SOLN: 60.0000 mg | Freq: Once | INTRAMUSCULAR | Status: AC | PRN

## 2012-07-16 MED ORDER — OXYTOCIN 40 UNITS IN LACTATED RINGERS INFUSION - SIMPLE MED: 62.5000 mL/h | INTRAVENOUS | Status: AC

## 2012-07-16 MED ORDER — PHENYLEPHRINE HCL 10 MG/ML IJ SOLN
INTRAMUSCULAR | Status: DC | PRN
  Administered 2012-07-16 (×4): 80 ug via INTRAVENOUS

## 2012-07-16 MED ORDER — SENNOSIDES-DOCUSATE SODIUM 8.6-50 MG PO TABS
2.0000 | ORAL_TABLET | Freq: Every day | ORAL | Status: DC
  Administered 2012-07-16 – 2012-07-19 (×3): 2 via ORAL

## 2012-07-16 MED ORDER — ONDANSETRON HCL 4 MG/2ML IJ SOLN
4.0000 mg | INTRAMUSCULAR | Status: DC | PRN
  Administered 2012-07-17: 4 mg via INTRAVENOUS

## 2012-07-16 MED ORDER — HYDROMORPHONE HCL PF 1 MG/ML IJ SOLN
0.5000 mg | Freq: Once | INTRAMUSCULAR | Status: AC
  Administered 2012-07-16: 1 mg via INTRAVENOUS

## 2012-07-16 MED ORDER — CEFAZOLIN SODIUM-DEXTROSE 2-3 GM-% IV SOLR
INTRAVENOUS | Status: AC
  Filled 2012-07-16: qty 50

## 2012-07-16 MED ORDER — SCOPOLAMINE 1 MG/3DAYS TD PT72: 1.0000 | MEDICATED_PATCH | Freq: Once | TRANSDERMAL | Status: DC

## 2012-07-16 MED ORDER — SCOPOLAMINE 1 MG/3DAYS TD PT72
MEDICATED_PATCH | TRANSDERMAL | Status: AC
  Administered 2012-07-16: 1.5 mg via TRANSDERMAL
  Filled 2012-07-16: qty 1

## 2012-07-16 MED ORDER — CALCIUM CARBONATE ANTACID 500 MG PO CHEW
400.0000 mg | CHEWABLE_TABLET | Freq: Three times a day (TID) | ORAL | Status: DC
  Administered 2012-07-16 – 2012-07-19 (×8): 400 mg via ORAL
  Filled 2012-07-16 (×5): qty 1
  Filled 2012-07-16 (×2): qty 2
  Filled 2012-07-16 (×2): qty 1

## 2012-07-16 MED ORDER — LACTATED RINGERS IV SOLN
INTRAVENOUS | Status: DC
  Administered 2012-07-16: via INTRAVENOUS

## 2012-07-16 MED ORDER — ONDANSETRON HCL 4 MG/2ML IJ SOLN
4.0000 mg | Freq: Three times a day (TID) | INTRAMUSCULAR | Status: DC | PRN
  Filled 2012-07-16: qty 2

## 2012-07-16 MED ORDER — DIPHENHYDRAMINE HCL 25 MG PO CAPS
25.0000 mg | ORAL_CAPSULE | ORAL | Status: DC | PRN
  Administered 2012-07-17: 25 mg via ORAL
  Filled 2012-07-16 (×2): qty 1

## 2012-07-16 MED ORDER — SCOPOLAMINE 1 MG/3DAYS TD PT72: 1.0000 | MEDICATED_PATCH | TRANSDERMAL | Status: DC

## 2012-07-16 MED ORDER — TETANUS-DIPHTH-ACELL PERTUSSIS 5-2.5-18.5 LF-MCG/0.5 IM SUSP
0.5000 mL | Freq: Once | INTRAMUSCULAR | Status: AC
  Administered 2012-07-19: 0.5 mL via INTRAMUSCULAR
  Filled 2012-07-16: qty 0.5

## 2012-07-16 MED ORDER — CEFAZOLIN SODIUM-DEXTROSE 2-3 GM-% IV SOLR
2.0000 g | INTRAVENOUS | Status: AC
  Administered 2012-07-16: 2 g via INTRAVENOUS

## 2012-07-16 MED ORDER — MORPHINE SULFATE 0.5 MG/ML IJ SOLN
INTRAMUSCULAR | Status: AC
  Filled 2012-07-16: qty 10

## 2012-07-16 MED ORDER — SIMETHICONE 80 MG PO CHEW
80.0000 mg | CHEWABLE_TABLET | Freq: Three times a day (TID) | ORAL | Status: DC
  Administered 2012-07-16 – 2012-07-19 (×11): 80 mg via ORAL

## 2012-07-16 MED ORDER — DIBUCAINE 1 % RE OINT: 1.0000 "application " | TOPICAL_OINTMENT | RECTAL | Status: DC | PRN

## 2012-07-16 MED ORDER — KETOROLAC TROMETHAMINE 60 MG/2ML IM SOLN
INTRAMUSCULAR | Status: AC
  Administered 2012-07-16: 60 mg via INTRAMUSCULAR
  Filled 2012-07-16: qty 2

## 2012-07-16 MED ORDER — DIPHENHYDRAMINE HCL 50 MG/ML IJ SOLN: 25.0000 mg | INTRAMUSCULAR | Status: DC | PRN

## 2012-07-16 MED ORDER — CYANOCOBALAMIN 1000 MCG/ML IJ SOLN: 1000.0000 ug | INTRAMUSCULAR | Status: DC

## 2012-07-16 MED ORDER — METOCLOPRAMIDE HCL 5 MG/ML IJ SOLN: 10.0000 mg | Freq: Three times a day (TID) | INTRAMUSCULAR | Status: DC | PRN

## 2012-07-16 MED ORDER — ACETAMINOPHEN 10 MG/ML IV SOLN
1000.0000 mg | Freq: Four times a day (QID) | INTRAVENOUS | Status: AC | PRN
  Administered 2012-07-16: 1000 mg via INTRAVENOUS
  Filled 2012-07-16: qty 100

## 2012-07-16 MED ORDER — LACTATED RINGERS IV SOLN
INTRAVENOUS | Status: DC
  Administered 2012-07-16: 09:00:00 via INTRAVENOUS

## 2012-07-16 MED ORDER — MENTHOL 3 MG MT LOZG
1.0000 | LOZENGE | OROMUCOSAL | Status: DC | PRN
  Filled 2012-07-16: qty 9

## 2012-07-16 MED ORDER — FENTANYL CITRATE 0.05 MG/ML IJ SOLN
INTRAMUSCULAR | Status: DC | PRN
  Administered 2012-07-16: 75 ug via INTRAVENOUS
  Administered 2012-07-16: 25 ug via INTRATHECAL

## 2012-07-16 MED ORDER — HYDROMORPHONE HCL PF 1 MG/ML IJ SOLN: 0.2500 mg | INTRAMUSCULAR | Status: DC | PRN

## 2012-07-16 MED ORDER — ONDANSETRON HCL 4 MG/2ML IJ SOLN
INTRAMUSCULAR | Status: DC | PRN
  Administered 2012-07-16: 4 mg via INTRAVENOUS

## 2012-07-16 MED ORDER — FENTANYL CITRATE 0.05 MG/ML IJ SOLN
INTRAMUSCULAR | Status: AC
  Filled 2012-07-16: qty 2

## 2012-07-16 MED ORDER — KETOROLAC TROMETHAMINE 30 MG/ML IJ SOLN: 30.0000 mg | Freq: Four times a day (QID) | INTRAMUSCULAR | Status: AC | PRN

## 2012-07-16 MED ORDER — SIMETHICONE 80 MG PO CHEW: 80.0000 mg | CHEWABLE_TABLET | ORAL | Status: DC | PRN

## 2012-07-16 MED ORDER — ONDANSETRON HCL 4 MG/2ML IJ SOLN
INTRAMUSCULAR | Status: AC
  Filled 2012-07-16: qty 2

## 2012-07-16 MED ORDER — DIPHENHYDRAMINE HCL 25 MG PO CAPS: 25.0000 mg | ORAL_CAPSULE | Freq: Four times a day (QID) | ORAL | Status: DC | PRN

## 2012-07-16 MED ORDER — BUPIVACAINE IN DEXTROSE 0.75-8.25 % IT SOLN
INTRATHECAL | Status: DC | PRN
  Administered 2012-07-16: 1.5 mL via INTRATHECAL

## 2012-07-16 MED ORDER — 0.9 % SODIUM CHLORIDE (POUR BTL) OPTIME
TOPICAL | Status: DC | PRN
  Administered 2012-07-16: 1000 mL

## 2012-07-16 MED ORDER — LANOLIN HYDROUS EX OINT: 1.0000 "application " | TOPICAL_OINTMENT | CUTANEOUS | Status: DC | PRN

## 2012-07-16 SURGICAL SUPPLY — 37 items
BENZOIN TINCTURE PRP APPL 2/3 (GAUZE/BANDAGES/DRESSINGS) IMPLANT
CLAMP CORD UMBIL (MISCELLANEOUS) IMPLANT
CLOTH BEACON ORANGE TIMEOUT ST (SAFETY) ×3 IMPLANT
CONTAINER PREFILL 10% NBF 15ML (MISCELLANEOUS) ×6 IMPLANT
DRAPE LG THREE QUARTER DISP (DRAPES) ×3 IMPLANT
DRSG OPSITE POSTOP 4X10 (GAUZE/BANDAGES/DRESSINGS) ×3 IMPLANT
DURAPREP 26ML APPLICATOR (WOUND CARE) ×3 IMPLANT
ELECT REM PT RETURN 9FT ADLT (ELECTROSURGICAL) ×3
ELECTRODE REM PT RTRN 9FT ADLT (ELECTROSURGICAL) ×2 IMPLANT
EXTRACTOR VACUUM KIWI (MISCELLANEOUS) IMPLANT
EXTRACTOR VACUUM M CUP 4 TUBE (SUCTIONS) IMPLANT
GLOVE BIO SURGEON STRL SZ 6.5 (GLOVE) ×3 IMPLANT
GLOVE SURG SS PI 6.5 STRL IVOR (GLOVE) ×6 IMPLANT
GLOVE SURG SS PI 7.0 STRL IVOR (GLOVE) ×12 IMPLANT
GLOVE SURG SS PI 7.5 STRL IVOR (GLOVE) ×3 IMPLANT
GOWN STRL REIN XL XLG (GOWN DISPOSABLE) ×6 IMPLANT
KIT ABG SYR 3ML LUER SLIP (SYRINGE) IMPLANT
NEEDLE HYPO 25X5/8 SAFETYGLIDE (NEEDLE) IMPLANT
NS IRRIG 1000ML POUR BTL (IV SOLUTION) ×3 IMPLANT
PACK C SECTION WH (CUSTOM PROCEDURE TRAY) ×3 IMPLANT
PAD OB MATERNITY 4.3X12.25 (PERSONAL CARE ITEMS) ×3 IMPLANT
RTRCTR C-SECT PINK 25CM LRG (MISCELLANEOUS) ×3 IMPLANT
STAPLER VISISTAT 35W (STAPLE) IMPLANT
STRIP CLOSURE SKIN 1/2X4 (GAUZE/BANDAGES/DRESSINGS) IMPLANT
SUT CHROMIC 1 CTX 36 (SUTURE) ×6 IMPLANT
SUT PLAIN 0 NONE (SUTURE) ×3 IMPLANT
SUT PLAIN 2 0 XLH (SUTURE) IMPLANT
SUT VIC AB 0 CT1 27 (SUTURE) ×3
SUT VIC AB 0 CT1 27XBRD ANBCTR (SUTURE) ×6 IMPLANT
SUT VIC AB 2-0 CT1 27 (SUTURE)
SUT VIC AB 2-0 CT1 TAPERPNT 27 (SUTURE) IMPLANT
SUT VIC AB 2-0 SH 27 (SUTURE) ×1
SUT VIC AB 2-0 SH 27XBRD (SUTURE) ×2 IMPLANT
SUT VIC AB 4-0 KS 27 (SUTURE) ×3 IMPLANT
TOWEL OR 17X24 6PK STRL BLUE (TOWEL DISPOSABLE) ×9 IMPLANT
TRAY FOLEY CATH 14FR (SET/KITS/TRAYS/PACK) ×3 IMPLANT
WATER STERILE IRR 1000ML POUR (IV SOLUTION) ×3 IMPLANT

## 2012-07-16 NOTE — Brief Op Note (Signed)
07/16/2012  10:18 AM  PATIENT:  Manessa A Goyal  37 y.o. female  PRE-OPERATIVE DIAGNOSIS:  history of preterm labor and previous cesarean section with "T" 'd incision, [phone], desires sterility  POST-OPERATIVE DIAGNOSIS:  history of preterm labor and previous cesarean section, fibroids  PROCEDURE:  Procedure(s): REPEAT CESAREAN SECTION (N/A) BILATERAL DISTAL SALPINGECTOMY  SURGEON:  Surgeon(s) and Role:    * Oliver Pila, MD - Primary    * Lavina Hamman, MD - Assisting   ANESTHESIA:   spinal  EBL:  Total I/O In: 2900 [I.V.:2900] Out: 600 [Urine:100; Blood:500]  BLOOD ADMINISTERED:none  DRAINS: Urinary Catheter (Foley)   LOCAL MEDICATIONS USED:  NONE  SPECIMEN:  Bilateral fallopian tube segments and placenta  DISPOSITION OF SPECIMEN:  Pathology and L&D  COUNTS:  YES  TOURNIQUET:  * No tourniquets in log *  DICTATION: .Dragon Dictation  PLAN OF CARE: Admit to inpatient   PATIENT DISPOSITION:  PACU - hemodynamically stable.

## 2012-07-16 NOTE — Anesthesia Postprocedure Evaluation (Signed)
  Anesthesia Post-op Note  Patient: Jamie Mccarthy  Procedure(s) Performed: Procedure(s): REPEAT CESAREAN SECTION (N/A) BILATERAL DISTAL SALPINGECTOMY  Patient Location: Mother/Baby  Anesthesia Type:Spinal  Level of Consciousness: awake  Airway and Oxygen Therapy: Patient Spontanous Breathing  Post-op Pain: mild  Post-op Assessment: Patient's Cardiovascular Status Stable and Respiratory Function Stable  Post-op Vital Signs: stable  Complications: No apparent anesthesia complications

## 2012-07-16 NOTE — Anesthesia Preprocedure Evaluation (Signed)
Anesthesia Evaluation  Patient identified by MRN, date of birth, ID band Patient awake    Reviewed: Allergy & Precautions, H&P , Patient's Chart, lab work & pertinent test results  Airway Mallampati: II TM Distance: >3 FB Neck ROM: full    Dental no notable dental hx.    Pulmonary  breath sounds clear to auscultation  Pulmonary exam normal       Cardiovascular Exercise Tolerance: Good hypertension (PIH recently, No meds currently. Slight elevation today), Rhythm:regular Rate:Normal     Neuro/Psych    GI/Hepatic   Endo/Other  diabetes, GestationalMorbid obesity  Renal/GU      Musculoskeletal   Abdominal   Peds  Hematology  (+) anemia ,   Anesthesia Other Findings   Reproductive/Obstetrics                           Anesthesia Physical Anesthesia Plan  ASA: III  Anesthesia Plan: Spinal   Post-op Pain Management:    Induction:   Airway Management Planned:   Additional Equipment:   Intra-op Plan:   Post-operative Plan:   Informed Consent: I have reviewed the patients History and Physical, chart, labs and discussed the procedure including the risks, benefits and alternatives for the proposed anesthesia with the patient or authorized representative who has indicated his/her understanding and acceptance.   Dental Advisory Given  Plan Discussed with: CRNA  Anesthesia Plan Comments: (Lab work confirmed with CRNA in room. Platelets okay. Discussed spinal anesthetic, and patient consents to the procedure:  included risk of possible headache,backache, failed block, allergic reaction, and nerve injury. This patient was asked if she had any questions or concerns before the procedure started. )        Anesthesia Quick Evaluation

## 2012-07-16 NOTE — Progress Notes (Signed)
Pt rating pain at a 7 out of 10.  Anesthesia called, orders received.

## 2012-07-16 NOTE — Consult Note (Signed)
Neonatology Note:   Attendance at C-section:    I was asked by Dr. Senaida Ores to attend this repeat C/S at term. The mother is a G3P3 O pos, GBS neg with PNC beginning at 23 weeks, history of HSV, on Valtrex, diet-controlled GDM, fibroids, chronic anemia, and sickle trait (husband is not a carrier). ROM at delivery, fluid clear. Infant vigorous with good spontaneous cry and tone. Needed only minimal bulb suctioning. Ap 9/9. Lungs clear to ausc in DR. To CN to care of Pediatrician.   Doretha Sou, MD

## 2012-07-16 NOTE — Anesthesia Procedure Notes (Signed)
Spinal  Patient location during procedure: OR Start time: 07/16/2012 9:05 AM Staffing Anesthesiologist: Eldean Klatt A. Preanesthetic Checklist Completed: patient identified, site marked, surgical consent, pre-op evaluation, timeout performed, IV checked, risks and benefits discussed and monitors and equipment checked Spinal Block Patient position: sitting Prep: site prepped and draped and DuraPrep Patient monitoring: heart rate, cardiac monitor, continuous pulse ox and blood pressure Approach: midline Location: L3-4 Injection technique: single-shot Needle Needle type: Sprotte  Needle gauge: 24 G Needle length: 9 cm Needle insertion depth: 6 cm Assessment Sensory level: T4 Additional Notes Patient tolerated procedure well. Adequate sensory level.

## 2012-07-16 NOTE — Transfer of Care (Signed)
Immediate Anesthesia Transfer of Care Note  Patient: Jamie Mccarthy  Procedure(s) Performed: Procedure(s): REPEAT CESAREAN SECTION (N/A) BILATERAL DISTAL SALPINGECTOMY  Patient Location: PACU  Anesthesia Type:Spinal  Level of Consciousness: awake, alert  and oriented  Airway & Oxygen Therapy: Patient Spontanous Breathing  Post-op Assessment: Report given to PACU RN and Post -op Vital signs reviewed and stable  Post vital signs: Reviewed and stable  Complications: No apparent anesthesia complications

## 2012-07-16 NOTE — Op Note (Signed)
Operative note  Preoperative diagnosis Term pregnancy at 38 weeks and 3 days gestation Prior C-section with "T"'d uterine incision Desires permanent sterility  Postoperative diagnosis Same  Procedure Repeat low transverse cesarean section Bilateral distal salpingectomy  Surgeon Dr. Huel Cote Dr. Tawanna Cooler Meisinger  Anesthesia Spinal  Fluids Estimated blood loss 900 cc Urine output 150 cc clear urine IV fluid 2200 cc LR  Findings There was a viable female infant in the vertex presentation. Apgars 8 and 9 weight pending at time of dictation. The ovaries and tubes were normal bilaterally and a bilateral distal salpingectomy was performed. The uterus was noted to have fibroids with the most prominent being a upper right fundal fibroid measuring approximately 5 cm.  Specimen Fallopian tube segments to pathology Placenta to L&D  Procedure note After informed consent was obtained from the patient she was taken to the operating room where spinal anesthetic was obtained without difficulty. Patient was then prepped and draped in the normal sterile fashion in the dorsal supine position with a leftward tilt. An appropriate time out was performed. A Pfannenstiel skin incision was then made through pre-existing scar and carried through to underlying layer of fascia by sharp dissection and Bovie cautery. The fascia was nicked in the midline and the incision was extended laterally with Mayo scissors. The inferior aspect of the incision was grasped with cover clamps elevated and dissected off the underlying rectus muscles in a similar fashion the superior aspect was dissected off the rectus muscles. These were separated in the midline and the peritoneum was entered bluntly peritoneal incision extended both superiorly and inferiorly with careful attention to avoid both bowel bladder. The Alexis self-retaining retractor was then placed within the incision and the lower uterine segment was exposed  nicely. Lower uterine segment was noted to have some adhesions of the bladder flap which were taken down and pushed away and a small window was noted in the lower uterine segment on the patient's left side. The uterine incision was made where this window was located and extended with the cavity itself entered bluntly. The infant's head was then delivered atraumatically and the nose mouth bulb suctioned. The remainder of the body was delivered without difficulty and the cord clamped and cut with the infant handed to the waiting pediatricians. The placenta was then spontaneously expressed and handed off for cord blood donation. The uterus was cleared of all clots and debris with moist lap sponge. Although the uterus slightly bulky with its fibroids it did contract well with massage. The uterine incision did have some irregularity at the upper aspect consistent with the prior t'd incision.  The uterine incision was then closed in 2 layers the first a running locked layer 1-0 chromic and the second an imbricating layer of the same suture care was taken to close the superior aspect the irregularity as well. This appeared hemostatic so attention was turned to the patient's fallopian tubes the left fallopian tube was then applied and traced out to its fimbriated end the fimbriated end was then grasped a Babcock clamp and elevated and a distal salpingectomy performed of approximately the last fourth of the tube including all the fimbriated end. This was transected and handed off to pathology and the remaining pedicle suture ligated with 0 Vicryl x2. Good hemostasis was noted. Attention was then turned to the right fallopian tube which was likewise traced out to its fimbriated end and grasped a Babcock clamp. This was clamped with a Kelly clamp and the distal quarter  of the tube removed including the entire fimbriated end. This was likewise suture ligated with a 2-0 Vicryl. Both pedicles appeared hemostatic the gutters were  cleared of all clots and debris and the uterine incision inspected and found to be hemostatic. Therefore all instruments and sponges were removed from the abdomen. The rectus muscles and peritoneum were reapproximated with several interrupted mattress sutures of 2-0 Vicryl. The fascia was closed with 0 Vicryl in a running fashion. The subcutaneous tissue was reapproximated with 3-0 plain in a running fashion. The skin was closed with 4-0 Vicryl subcuticular stitch on a Keith needle. All instruments and sponge counts were once again correct and the patient was taken to the recovery room in good condition with her baby accompanying her.

## 2012-07-16 NOTE — Anesthesia Postprocedure Evaluation (Signed)
  Anesthesia Post-op Note  Patient: Jamie Mccarthy  Procedure(s) Performed: Procedure(s): REPEAT CESAREAN SECTION (N/A) BILATERAL DISTAL SALPINGECTOMY  Patient Location: PACU  Anesthesia Type:Spinal   Level of Consciousness: awake, alert  and oriented  Airway and Oxygen Therapy: Patient Spontanous Breathing  Post-op Pain: none  Post-op Assessment: Post-op Vital signs reviewed, Patient's Cardiovascular Status Stable, Respiratory Function Stable, Patent Airway, No signs of Nausea or vomiting, Pain level controlled, No headache and No backache  Post-op Vital Signs: Reviewed and stable  Complications: No apparent anesthesia complications

## 2012-07-16 NOTE — Progress Notes (Signed)
Pt c/o boil under left armpit, Dr. Ellyn Hack saw and asked for warm soaks to boil. Given to pt hot washcloth and towel to apply soaks.

## 2012-07-16 NOTE — Preoperative (Signed)
Beta Blockers   Reason not to administer Beta Blockers:Not Applicable 

## 2012-07-16 NOTE — Progress Notes (Signed)
Patient ID: Jamie Mccarthy, female   DOB: 09/30/1975, 37 y.o.   MRN: [id number] Pt is doing well.  She has decided that she wants permanent sterilization because her husband does not want to do a vasectomy.  We discussed risks and benefits including bleeding and that this is a permanent procedure.  She is definitely done with childbearing.  We discussed removal of the end of the fallopian tube to perhaps decrease future ovarian cancer risk.  She desires to proceed with this at time of c-section.  Brief exam WNL.

## 2012-07-17 ENCOUNTER — Encounter (HOSPITAL_COMMUNITY): Payer: Self-pay | Admitting: Obstetrics and Gynecology

## 2012-07-17 DIAGNOSIS — Z98891 History of uterine scar from previous surgery: Secondary | ICD-10-CM

## 2012-07-17 LAB — CBC
HCT: 27.6 % — ABNORMAL LOW (ref 36.0–46.0)
MCHC: 31.5 g/dL (ref 30.0–36.0)
MCV: 62.2 fL — ABNORMAL LOW (ref 78.0–100.0)
Platelets: 293 10*3/uL (ref 150–400)
RDW: 19.4 % — ABNORMAL HIGH (ref 11.5–15.5)
WBC: 14.1 10*3/uL — ABNORMAL HIGH (ref 4.0–10.5)

## 2012-07-17 LAB — CCBB MATERNAL DONOR DRAW

## 2012-07-17 MED ORDER — ACETAMINOPHEN-CODEINE #3 300-30 MG PO TABS
1.0000 | ORAL_TABLET | ORAL | Status: DC | PRN
  Administered 2012-07-17 – 2012-07-19 (×7): 1 via ORAL
  Filled 2012-07-17 (×8): qty 1

## 2012-07-17 NOTE — Discharge Summary (Addendum)
Obstetric Discharge Summary Reason for Admission: cesarean section Prenatal Procedures: NST Intrapartum Procedures: cesarean: low cervical, transverse Postpartum Procedures: none Complications-Operative and Postpartum: none Hemoglobin  Date Value Range Status  07/17/2012 8.7* 12.0 - 15.0 g/dL Final  1/61/0960 7.6   Final     HCT  Date Value Range Status  07/17/2012 27.6* 36.0 - 46.0 % Final    Physical Exam:  General: alert and cooperative Lochia: appropriate Uterine Fundus: firm Incision: healing well   Discharge Diagnoses: Term Pregnancy-delivered  Discharge Information: Date: 07/17/2012 Activity: pelvic rest Diet: routine Medications: Tylenol #3 and Percocet Condition: improved Instructions: refer to practice specific booklet Discharge to: home Follow-up Information   Follow up with Oliver Pila, MD. Schedule an appointment as soon as possible for a visit in 2 weeks. (Incision check)    Contact information:   [address], [address] [place] [address] [phone]       Newborn Data: Live born female  Birth Weight: 6 lb 12.6 oz (3079 g) APGAR: 9, 9  Home with mother.  D/C date 6-28  Oliver Pila 07/17/2012, 11:21 PM

## 2012-07-17 NOTE — Progress Notes (Signed)
Subjective: Postpartum Day1 Cesarean Delivery Patient reports incisional pain and tolerating PO.  Foley catheter just out this AM  Objective: Vital signs in last 24 hours: Temp:  [97.5 F (36.4 C)-98.3 F (36.8 C)] 98.2 F (36.8 C) (06/26 0631) Pulse Rate:  [56-94] 92 (06/26 0631) Resp:  [15-22] 18 (06/26 0631) BP: (104-146)/(57-98) 124/84 mmHg (06/26 0631) SpO2:  [97 %-100 %] 99 % (06/26 0631)  Physical Exam:  General: alert and cooperative Lochia: appropriate Uterine Fundus: firm Incision: C/D/I Small boil left axilla-1 cm and looks like coming to head, continue soaks for now   Recent Labs  07/14/12 1155 07/17/12 0500  HGB 8.7* 8.7*  HCT 28.1* 27.6*    Assessment/Plan: Status post Cesarean section. Doing well postoperatively.  Continue current care.  Oliver Pila 07/17/2012, 9:28 AM

## 2012-07-18 MED ORDER — ACETAMINOPHEN-CODEINE #3 300-30 MG PO TABS: 1.0000 | ORAL_TABLET | ORAL | Status: DC | PRN

## 2012-07-18 MED ORDER — IBUPROFEN 600 MG PO TABS: 600.0000 mg | ORAL_TABLET | Freq: Four times a day (QID) | ORAL | Status: DC

## 2012-07-18 NOTE — Progress Notes (Signed)
Subjective: Postpartum Day 2 Cesarean Delivery Patient reports incisional pain, tolerating PO and no problems voiding.    Objective: Vital signs in last 24 hours: Temp:  [98.3 F (36.8 C)-99.8 F (37.7 C)] 99.8 F (37.7 C) (06/27 0600) Pulse Rate:  [83-93] 85 (06/27 0600) Resp:  [18-20] 20 (06/27 0600) BP: (124-134)/(79-83) 134/83 mmHg (06/27 0600) SpO2:  [98 %] 98 % (06/26 1139)  Physical Exam:  General: alert and cooperative Lochia: appropriate Uterine Fundus: firm Incision: healing well    Recent Labs  07/17/12 0500  HGB 8.7*  HCT 27.6*    Assessment/Plan: Status post Cesarean section. Doing well postoperatively.  Discharge home with standard precautions and return to office in 2 weeks.  Oliver Pila 07/18/2012, 8:46 AM

## 2012-07-18 NOTE — Lactation Note (Signed)
This note was copied from the chart of Jamie Leyana Laiche. Lactation Consultation Note  Patient Name: Jamie Mccarthy Today's Date: 07/18/2012 Reason for consult: Follow-up assessment   Maternal Data    Feeding   LATCH Score/Interventions     Lactation Tools Discussed/Used     Consult Status Consult Status: Complete  Experienced BF mom reports that baby just fed and is nursing well. Reports that baby occassionally bites at the end of the feeding but nipples look intact, no redness noted. Is rubbing EBM into them after nursing. No questions at present. To call prn.  Pamelia Hoit 07/18/2012, 2:06 PM

## 2012-07-19 NOTE — Progress Notes (Signed)
POD #3 Decided not to go home yesterday, no problems Afeb, VSS Abd- soft, fundus firm, incision intact D/c home

## 2012-07-21 ENCOUNTER — Encounter (HOSPITAL_COMMUNITY): Payer: Self-pay | Admitting: *Deleted

## 2012-07-21 ENCOUNTER — Inpatient Hospital Stay (HOSPITAL_COMMUNITY)
Admission: AD | Admit: 2012-07-21 | Discharge: 2012-07-21 | Disposition: A | Discharge status: Home / Self Care | Payer: 59 | Source: Ambulatory Visit | Attending: Obstetrics and Gynecology | Admitting: Obstetrics and Gynecology

## 2012-07-21 DIAGNOSIS — M7989 Other specified soft tissue disorders: Secondary | ICD-10-CM

## 2012-07-21 DIAGNOSIS — R51 Headache: Secondary | ICD-10-CM | POA: Insufficient documentation

## 2012-07-21 DIAGNOSIS — IMO0002 Reserved for concepts with insufficient information to code with codable children: Secondary | ICD-10-CM | POA: Insufficient documentation

## 2012-07-21 MED ORDER — METOCLOPRAMIDE HCL 10 MG PO TABS: 10.0000 mg | ORAL_TABLET | Freq: Four times a day (QID) | ORAL | Status: DC

## 2012-07-21 MED ORDER — METOCLOPRAMIDE HCL 10 MG PO TABS
10.0000 mg | ORAL_TABLET | Freq: Once | ORAL | Status: AC
  Administered 2012-07-21: 10 mg via ORAL
  Filled 2012-07-21: qty 1

## 2012-07-21 NOTE — MAU Provider Note (Signed)
History     CSN: [id number]  Arrival date and time: 07/21/12 1829   None     Chief Complaint  Patient presents with  . Leg Swelling   HPI 37 y.o. G3P2104 at 5 days PP s/p RLTCS with BTL with swelling in left leg below knee down to foot x 2 days, no pain. Pt also reports some headaches, history of migraines, she states this feels like her migraines. + nausea.   Past Medical History  Diagnosis Date  . No pertinent past medical history   . PONV (postoperative nausea and vomiting)     has had PONV with previous c/s and also lap band surgery 2004  . GERD (gastroesophageal reflux disease)     diet controlled  . Anemia     Has sickle cell trait  . Gestational diabetes     with current pregnancy only-diet controlled    Past Surgical History  Procedure Laterality Date  . Laparoscopic gastric banding  2005  . Cesarean section    . Gestational diabetes      with current pregnancy only- diet controlled  . Cesarean section N/A 07/16/2012    Procedure: REPEAT CESAREAN SECTION;  Surgeon: Oliver Pila, MD;  Location: [facility];  Service: Obstetrics;  Laterality: N/A;  . Bilateral salpingectomy  07/16/2012    Procedure: BILATERAL DISTAL SALPINGECTOMY;  Surgeon: Oliver Pila, MD;  Location: [facility];  Service: Obstetrics;;    Family History  Problem Relation Age of Onset  . Diabetes      father's family  . Breast cancer Maternal Grandmother   . Hypertension Mother   . Thyroid cancer      History  Substance Use Topics  . Smoking status: Never Smoker   . Smokeless tobacco: Not on file  . Alcohol Use: No    Allergies:  Allergies  Allergen Reactions  . Latex Itching    Pt states she gets severely itchy with latex    Prescriptions prior to admission  Medication Sig Dispense Refill  . fluconazole (DIFLUCAN) 150 MG tablet TAKE 1 TAB BY MOUTH ONCE.  1 tablet  0  . metroNIDAZOLE (FLAGYL) 500 MG tablet Take 1 tablet (500 mg total) by mouth 2 (two) times daily.  14 tablet   0    Review of Systems  Constitutional: Negative.   Eyes: Negative.   Respiratory: Negative.   Cardiovascular: Positive for leg swelling.  Gastrointestinal: Positive for nausea.  Genitourinary:       + lochia   Neurological: Positive for headaches.   Physical Exam   Blood pressure 141/82, pulse 80, temperature 98.8 F (37.1 C), temperature source Oral, resp. rate 16, last menstrual period 10/21/2011, SpO2 99.00%, unknown if currently breastfeeding.  Physical Exam  Nursing note and vitals reviewed. Constitutional: She is oriented to person, place, and time. She appears well-developed and well-nourished. No distress.  Cardiovascular: Normal rate.   Respiratory: Effort normal.  GI: Soft. There is no tenderness.  Musculoskeletal:       Right ankle: Normal.       Left ankle: Normal.       Right upper leg: She exhibits no tenderness, no swelling and no edema.       Left upper leg: She exhibits no tenderness, no swelling and no edema.       Right lower leg: Normal. Right lower leg edema: calf circumference 36 cm.       Left lower leg: She exhibits edema (mild, calf circumference 38 cm). She  exhibits no tenderness.       Right foot: She exhibits swelling (minimal). She exhibits no tenderness.       Left foot: She exhibits swelling (minimal). She exhibits no tenderness.  No erythema noted in bilateral lower extremities   Neurological: She is alert and oriented to person, place, and time.  Skin: Skin is warm and dry.  Psychiatric: She has a normal mood and affect.    MAU Course  Procedures   Assessment and Plan   1. Swelling of left lower extremity   Minimal swelling of left lower extremity, no evidence of DVT, rev'd precautions, f/u if sx worsen  Reglan for headache/nausea - report if sx worsen   Medication List         fluconazole 150 MG tablet  Commonly known as:  DIFLUCAN  TAKE 1 TAB BY MOUTH ONCE.     metoCLOPramide 10 MG tablet  Commonly known as:  REGLAN   Take 1 tablet (10 mg total) by mouth 4 (four) times daily.     metroNIDAZOLE 500 MG tablet  Commonly known as:  FLAGYL  Take 1 tablet (500 mg total) by mouth 2 (two) times daily.            Follow-up Information   Follow up with Bing Plume, MD. (as scheduled)    Contact information:   [address], [address] [place] [address] [phone]         Lasonja Lakins 07/21/2012, 7:08 PM

## 2012-07-21 NOTE — MAU Note (Signed)
Patient states she delivered by repeat cesarean section on 6-25. Noted yesterday swelling in left leg and foot from the knee down. Patient states she has had a headache for 3 days.

## 2012-08-22 ENCOUNTER — Ambulatory Visit: Payer: 59 | Admitting: Family Medicine

## 2012-08-22 ENCOUNTER — Encounter: Payer: Self-pay | Admitting: Family Medicine

## 2012-08-22 ENCOUNTER — Telehealth: Payer: Self-pay | Admitting: Hematology and Oncology

## 2012-08-22 ENCOUNTER — Ambulatory Visit (INDEPENDENT_AMBULATORY_CARE_PROVIDER_SITE_OTHER): Payer: 59 | Admitting: Family Medicine

## 2012-08-22 VITALS — BP 118/84 | HR 80 | Ht 64.0 in | Wt 211.0 lb

## 2012-08-22 DIAGNOSIS — D509 Iron deficiency anemia, unspecified: Secondary | ICD-10-CM

## 2012-08-22 DIAGNOSIS — R109 Unspecified abdominal pain: Secondary | ICD-10-CM

## 2012-08-22 DIAGNOSIS — D573 Sickle-cell trait: Secondary | ICD-10-CM

## 2012-08-22 DIAGNOSIS — R1011 Right upper quadrant pain: Secondary | ICD-10-CM

## 2012-08-22 NOTE — Telephone Encounter (Signed)
LVOM FOR PT TO RETURN CALL IN RE TO NP APPT,

## 2012-08-22 NOTE — Progress Notes (Signed)
  Subjective:    Patient ID: Jamie Mccarthy, female    DOB: 1975-12-13, 37 y.o.   MRN: [id number]  HPI He has been severely anemic for several years at this point. She has been on oral iron on and off. And never really seems to get her levels to come up. Hx of sickle cell trait.  She recently was pregnant, thus I have not seen her in almost a year. She had an iron infusion back in March at [facility]. She says afterwards she actually felt fantastic. She had more energy than she had a really long time. She wants to know what to do next to get her iron level.  She has had her tubes tied and is no longer interested in future pregnancies. Her last pregnancy went well.  Abnormal CT scan of the abdomen-she was noted to have some fluid around the liver over a year ago. We started to reorder the test last October and around that time she found out she was pregnant so we held off on the testing. Review of Systems     Objective:   Physical Exam  Constitutional: She appears well-developed and well-nourished.  HENT:  Head: Normocephalic and atraumatic.  Skin: Skin is warm and dry.  Psychiatric: She has a normal mood and affect. Her behavior is normal.          Assessment & Plan:  Severe iron deficiency anemia-unresponsive to oral iron. Recommend repeat transfusions to get her levels back up to normal and then monitoring as needed to maintain levels. This would be best done by a hematologist I would like to refer her to hematology for further evaluation and treatment.  CT abdomen abnormal-recommend repeat CT to follow up on the fluid around the liver. She does have chronic right flank pain and some intermittent right upper quadrant pain. Will call to schedule this. We will try to get this scheduled in [place].

## 2012-08-23 LAB — CBC WITH DIFFERENTIAL/PLATELET
Basophils Absolute: 0 10*3/uL (ref 0.0–0.1)
Eosinophils Absolute: 0.3 10*3/uL (ref 0.0–0.7)
Eosinophils Relative: 5 % (ref 0–5)
Lymphs Abs: 4.2 10*3/uL — ABNORMAL HIGH (ref 0.7–4.0)
MCH: 19.2 pg — ABNORMAL LOW (ref 26.0–34.0)
MCV: 62.9 fL — ABNORMAL LOW (ref 78.0–100.0)
Neutrophils Relative %: 30 % — ABNORMAL LOW (ref 43–77)
Platelets: 385 10*3/uL (ref 150–400)
RBC: 5.52 MIL/uL — ABNORMAL HIGH (ref 3.87–5.11)
RDW: 21.6 % — ABNORMAL HIGH (ref 11.5–15.5)
WBC: 7.1 10*3/uL (ref 4.0–10.5)

## 2012-08-23 LAB — TRANSFERRIN: Transferrin: 339 mg/dL (ref 200–360)

## 2012-09-01 ENCOUNTER — Telehealth: Payer: Self-pay | Admitting: Hematology and Oncology

## 2012-09-01 NOTE — Telephone Encounter (Signed)
S/W PT IN RE TO APPT 08/26 @ 10:30 W/DR. VICTOR.

## 2012-09-12 ENCOUNTER — Encounter (HOSPITAL_COMMUNITY): Payer: Self-pay | Admitting: Emergency Medicine

## 2012-09-12 ENCOUNTER — Ambulatory Visit
Admission: RE | Admit: 2012-09-12 | Discharge: 2012-09-12 | Disposition: A | Discharge status: Home / Self Care | Payer: 59 | Source: Ambulatory Visit | Attending: Family Medicine | Admitting: Family Medicine

## 2012-09-12 ENCOUNTER — Other Ambulatory Visit: Payer: 59

## 2012-09-12 ENCOUNTER — Emergency Department (HOSPITAL_COMMUNITY): Payer: 59

## 2012-09-12 ENCOUNTER — Emergency Department (HOSPITAL_COMMUNITY)
Admission: EM | Admit: 2012-09-12 | Discharge: 2012-09-13 | Disposition: A | Discharge status: Home / Self Care | Payer: 59 | Attending: Emergency Medicine | Admitting: Emergency Medicine

## 2012-09-12 DIAGNOSIS — Z79899 Other long term (current) drug therapy: Secondary | ICD-10-CM | POA: Insufficient documentation

## 2012-09-12 DIAGNOSIS — R109 Unspecified abdominal pain: Secondary | ICD-10-CM

## 2012-09-12 DIAGNOSIS — Z862 Personal history of diseases of the blood and blood-forming organs and certain disorders involving the immune mechanism: Secondary | ICD-10-CM | POA: Insufficient documentation

## 2012-09-12 DIAGNOSIS — Z8719 Personal history of other diseases of the digestive system: Secondary | ICD-10-CM | POA: Insufficient documentation

## 2012-09-12 DIAGNOSIS — Z3202 Encounter for pregnancy test, result negative: Secondary | ICD-10-CM | POA: Insufficient documentation

## 2012-09-12 DIAGNOSIS — Z9884 Bariatric surgery status: Secondary | ICD-10-CM | POA: Insufficient documentation

## 2012-09-12 DIAGNOSIS — Z91040 Latex allergy status: Secondary | ICD-10-CM | POA: Insufficient documentation

## 2012-09-12 DIAGNOSIS — Z8632 Personal history of gestational diabetes: Secondary | ICD-10-CM | POA: Insufficient documentation

## 2012-09-12 DIAGNOSIS — R110 Nausea: Secondary | ICD-10-CM | POA: Insufficient documentation

## 2012-09-12 DIAGNOSIS — Z9079 Acquired absence of other genital organ(s): Secondary | ICD-10-CM | POA: Insufficient documentation

## 2012-09-12 DIAGNOSIS — R1011 Right upper quadrant pain: Secondary | ICD-10-CM

## 2012-09-12 DIAGNOSIS — Z9889 Other specified postprocedural states: Secondary | ICD-10-CM | POA: Insufficient documentation

## 2012-09-12 LAB — CBC WITH DIFFERENTIAL/PLATELET
Basophils Absolute: 0 10*3/uL (ref 0.0–0.1)
Lymphocytes Relative: 49 % — ABNORMAL HIGH (ref 12–46)
Monocytes Relative: 6 % (ref 3–12)
Platelets: 327 10*3/uL (ref 150–400)
RDW: 22.1 % — ABNORMAL HIGH (ref 11.5–15.5)
WBC: 8.3 10*3/uL (ref 4.0–10.5)

## 2012-09-12 LAB — URINALYSIS, ROUTINE W REFLEX MICROSCOPIC
Glucose, UA: NEGATIVE mg/dL
Ketones, ur: NEGATIVE mg/dL
Specific Gravity, Urine: 1.016 (ref 1.005–1.030)
pH: 5.5 (ref 5.0–8.0)

## 2012-09-12 LAB — COMPREHENSIVE METABOLIC PANEL
AST: 18 U/L (ref 0–37)
Albumin: 3.8 g/dL (ref 3.5–5.2)
Alkaline Phosphatase: 80 U/L (ref 39–117)
Chloride: 104 mEq/L (ref 96–112)
Creatinine, Ser: 0.8 mg/dL (ref 0.50–1.10)
Potassium: 3.3 mEq/L — ABNORMAL LOW (ref 3.5–5.1)
Total Bilirubin: 0.4 mg/dL (ref 0.3–1.2)

## 2012-09-12 LAB — URINE MICROSCOPIC-ADD ON

## 2012-09-12 IMAGING — US US ABDOMEN COMPLETE
1 series · 14 of 25 positions shown · non-contrast
Comparison: [DATE]

CLINICAL DATA: Nominal pain.

ABDOMINAL ULTRASOUND COMPLETE

[Series 1: us abdomen complete · 0.25mm/px · 14 of 87 slices shown]
[im 1/87]
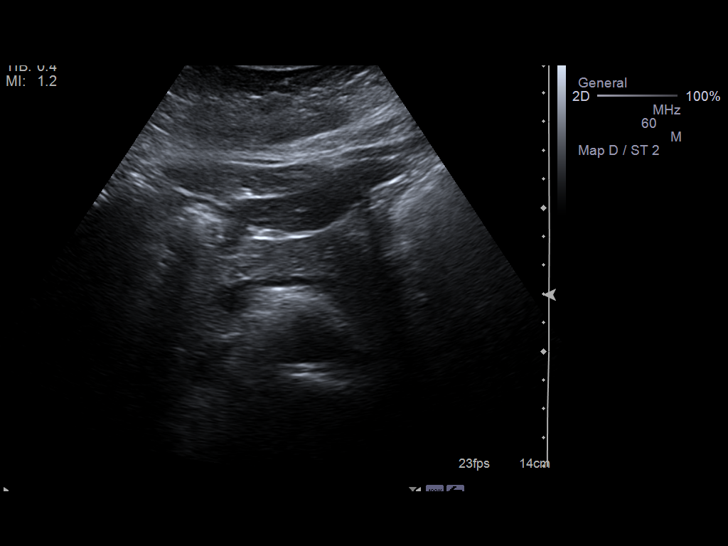
[im 8/87]
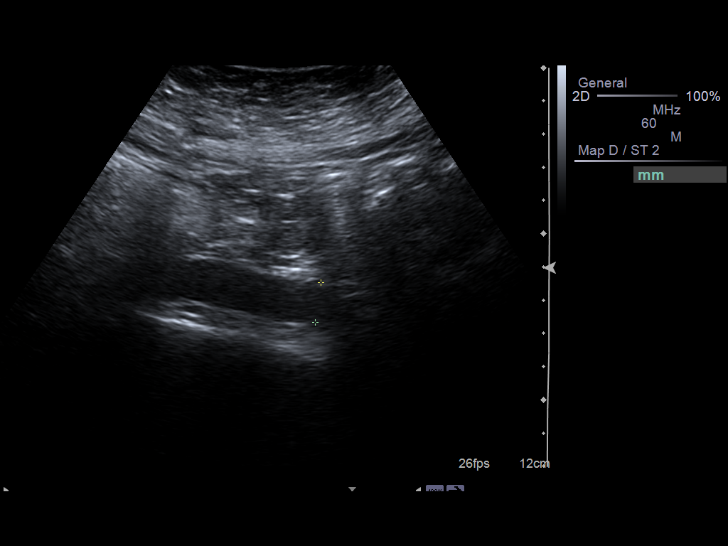
[im 15/87]
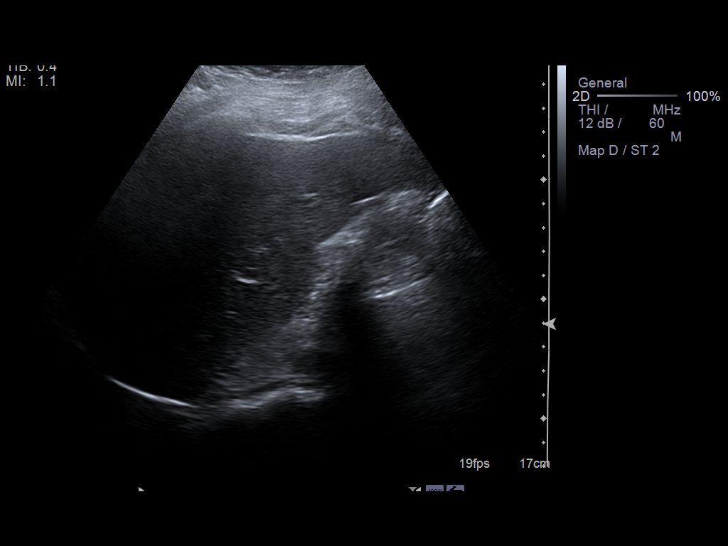
[im 22/87]
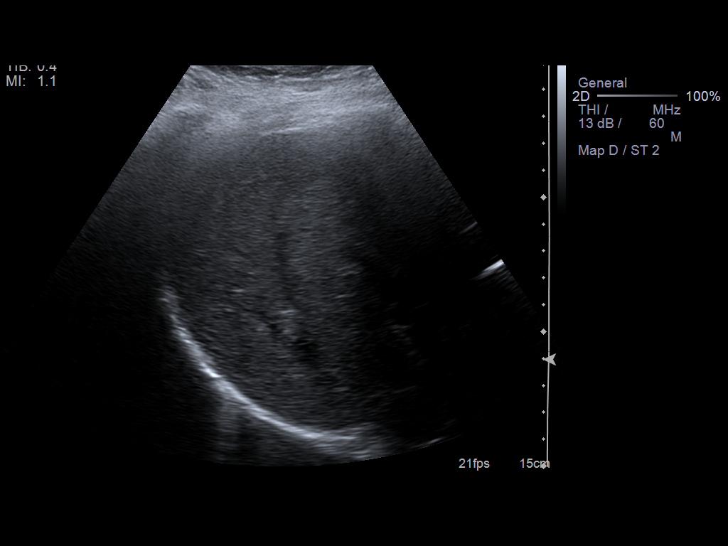
[im 29/87]
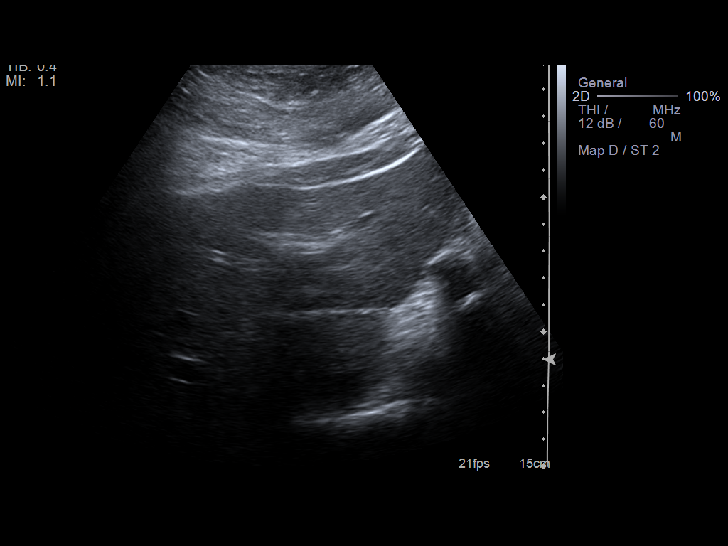
[im 33/87]
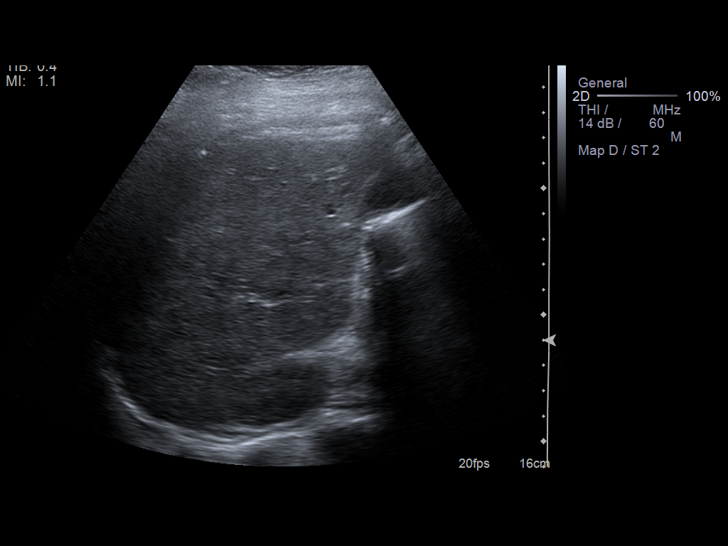
[im 40/87]
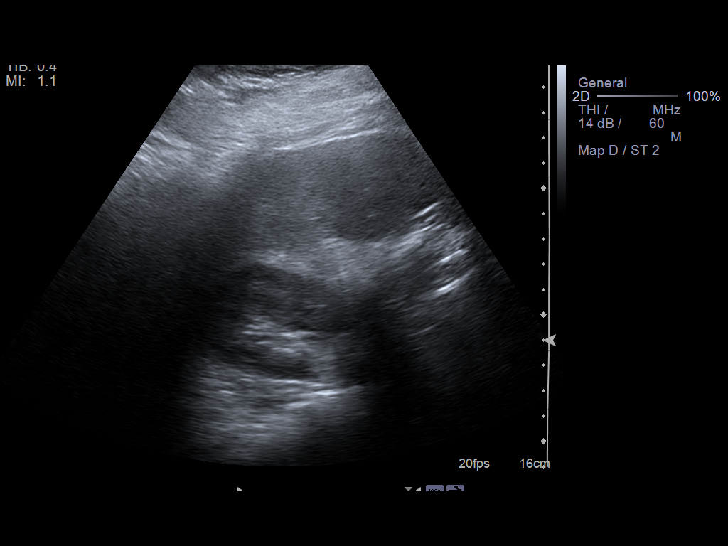
[im 47/87]
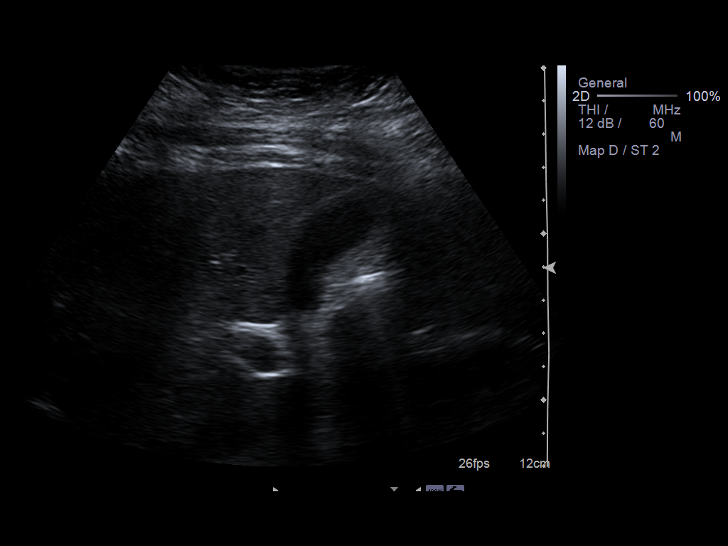
[im 54/87]
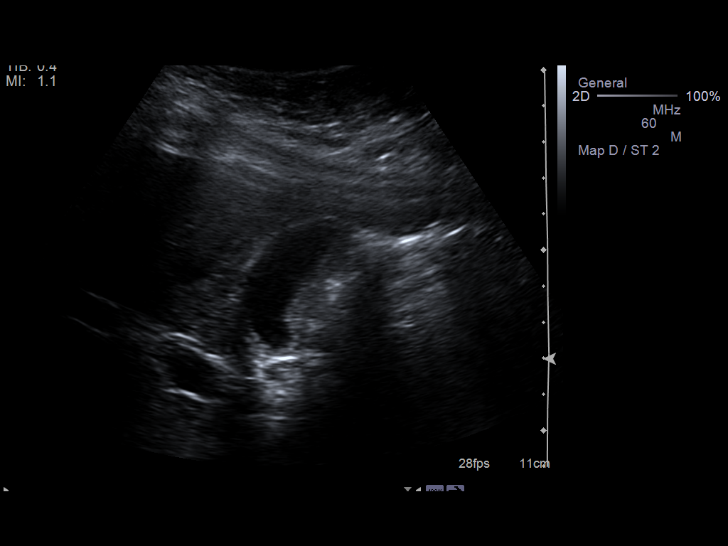
[im 58/87]
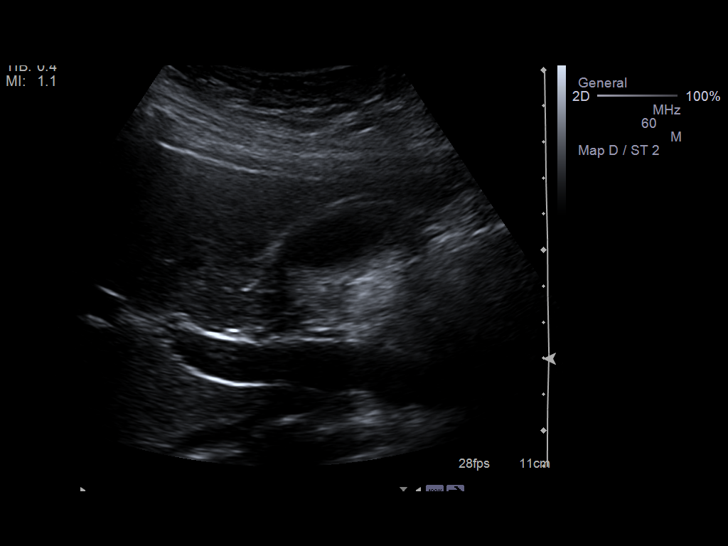
[im 65/87]
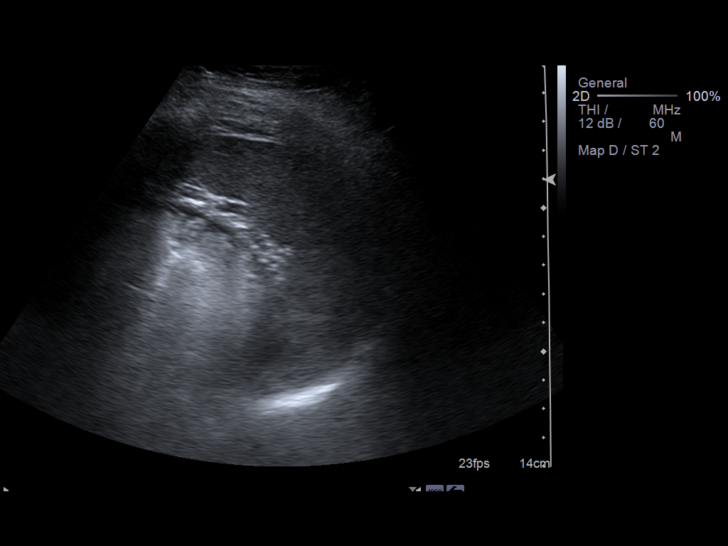
[im 72/87]
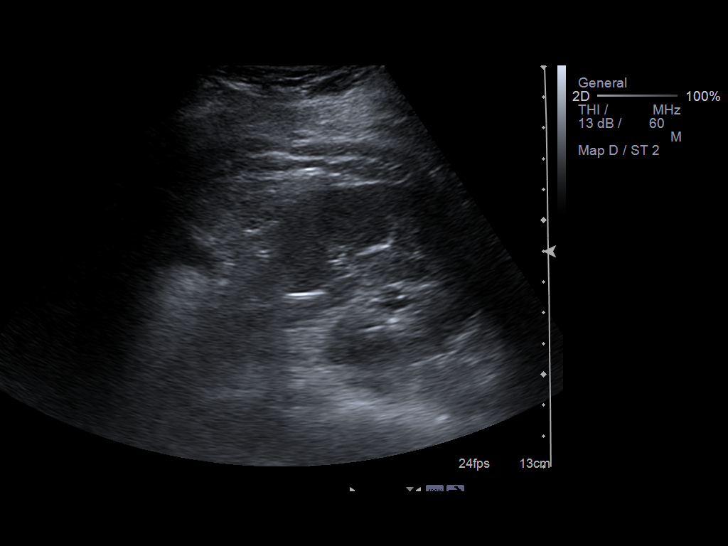
[im 79/87]
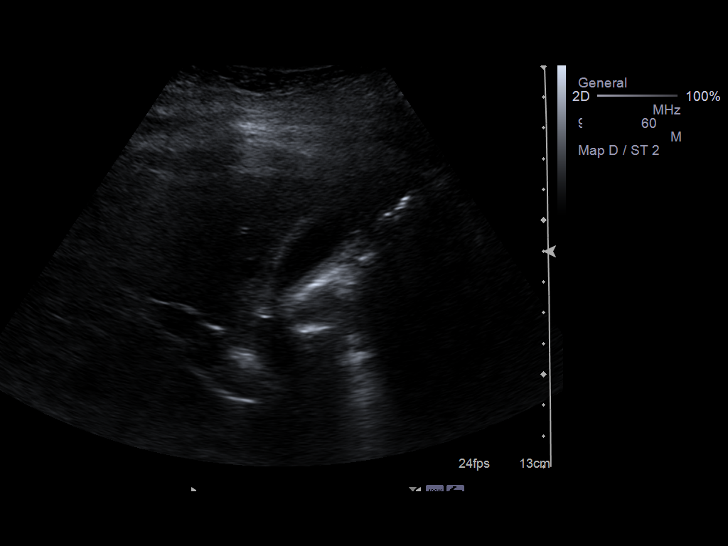
[im 87/87]
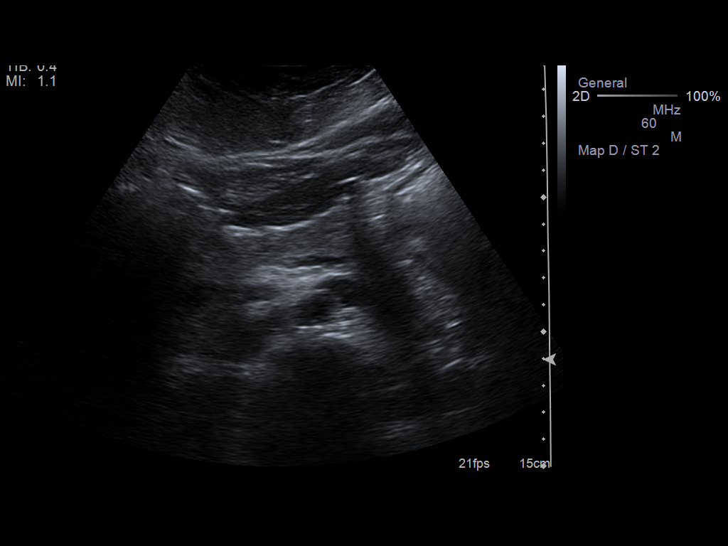

[14 of 25 positions shown; findings below may reference images not displayed]

FINDINGS: Gallbladder:  Contracted without evidence of stones or wall
thickening.  Negative sonographic Murphy's sign.

Common Bile Duct:  Within normal measuring 2.1 mm.

Liver: No focal mass lesion identified.  Within normal limits in
parenchymal echogenicity.

IVC:  Appears normal.

Pancreas:  No abnormality identified.

Spleen:  Within normal limits in size and echotexture.

Right kidney:  Normal in size and parenchymal echogenicity.  No
evidence of mass or hydronephrosis.  Measures 10.3 cm in diameter.

Left kidney:  Normal in size and parenchymal echogenicity.  No
evidence of mass or hydronephrosis.  Measures 10.6 cm in diameter.

Abdominal Aorta:  No aneurysm identified.
IMPRESSION: Negative abdominal ultrasound.

## 2012-09-12 IMAGING — CT CT ABD-PELV W/O CM
3 of 4 series · 13 of 36 positions shown, 19 images · non-contrast
Comparison: None.

CLINICAL DATA: Chronic right upper quadrant pain, right flank pain.
Nausea.

EXAM:
CT ABDOMEN AND PELVIS WITHOUT CONTRAST
TECHNIQUE: Multidetector CT imaging of the abdomen and pelvis was performed
following the standard protocol without intravenous contrast.

[Series 3: abd/pelvis w/o · axial · non-contrast · 0.82mm/px · z∈[-352,-2]mm · 8 of 91 slices shown, 13 images]
[im 11/91  soft-tissue]
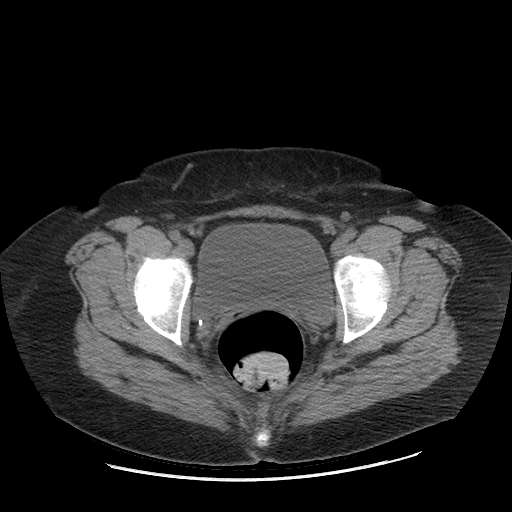
[im 11/91  bone]
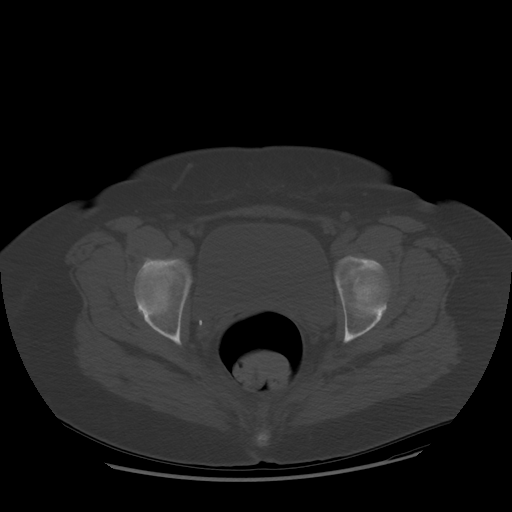
[im 21/91  soft-tissue]
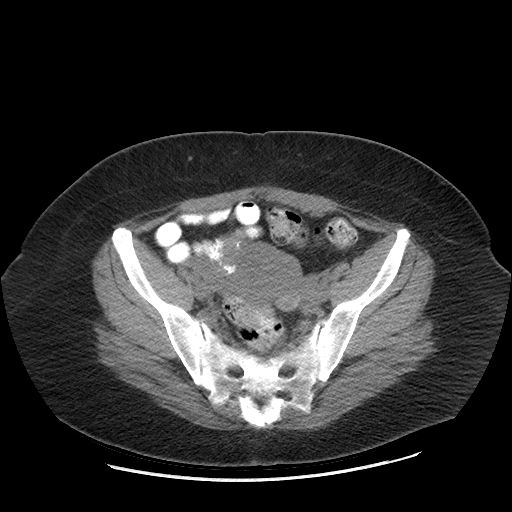
[im 31/91  soft-tissue]
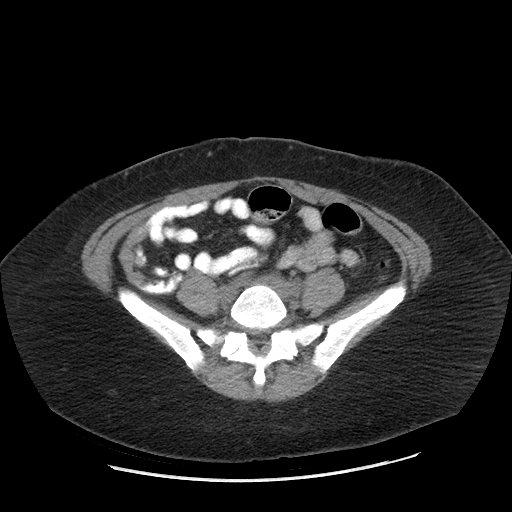
[im 41/91  soft-tissue]
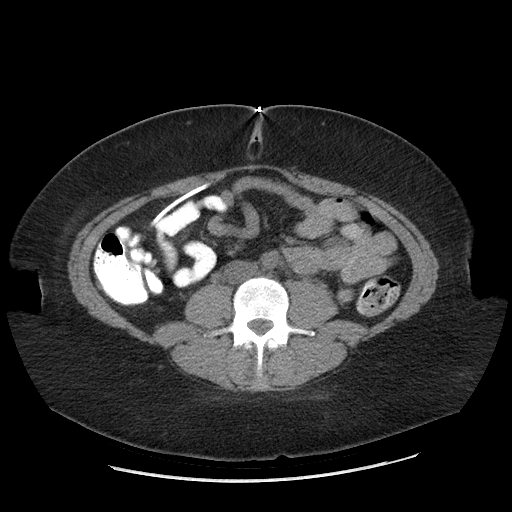
[im 51/91  soft-tissue]
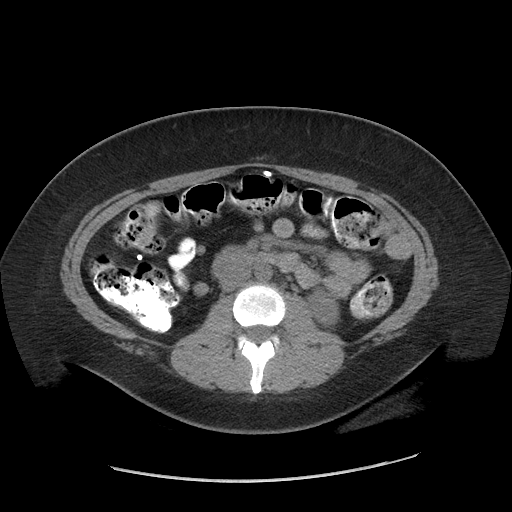
[im 51/91  lung]
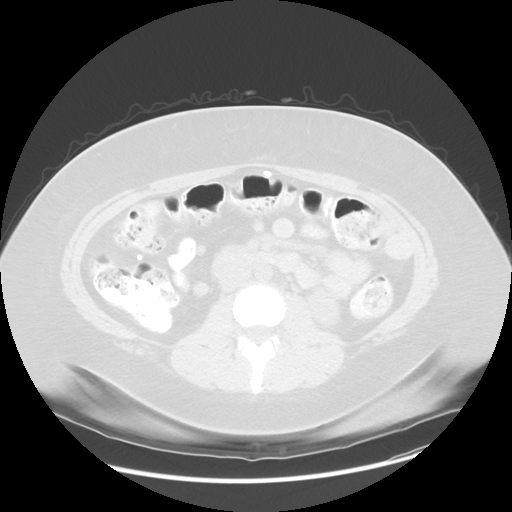
[im 61/91  soft-tissue]
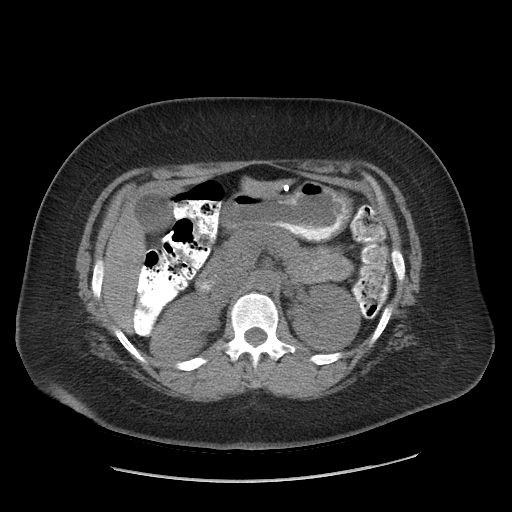
[im 61/91  lung]
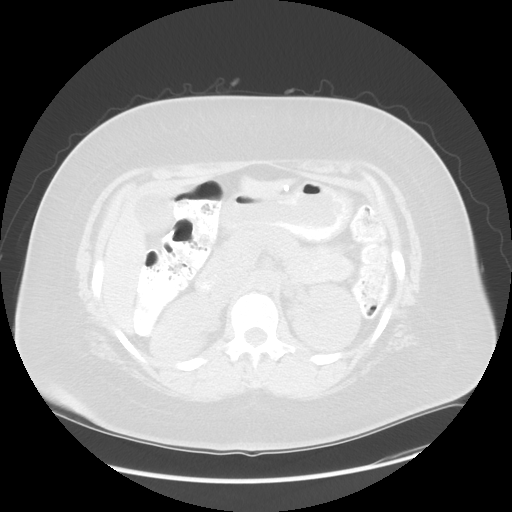
[im 71/91  soft-tissue]
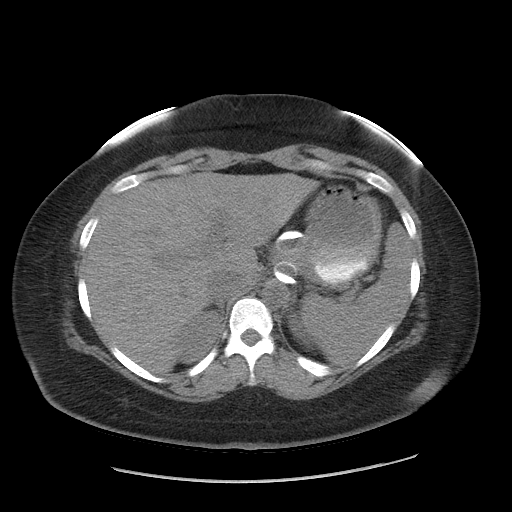
[im 71/91  lung]
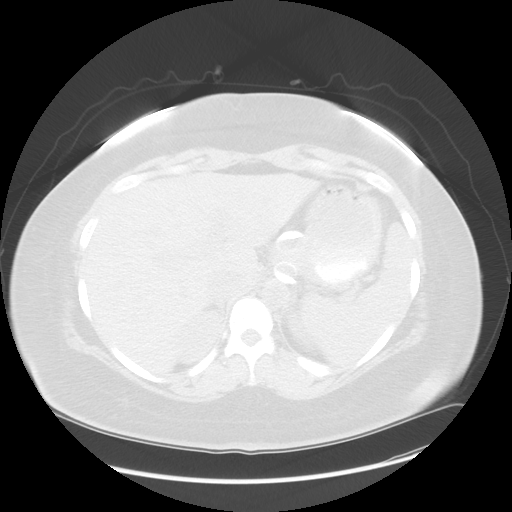
[im 81/91  soft-tissue]
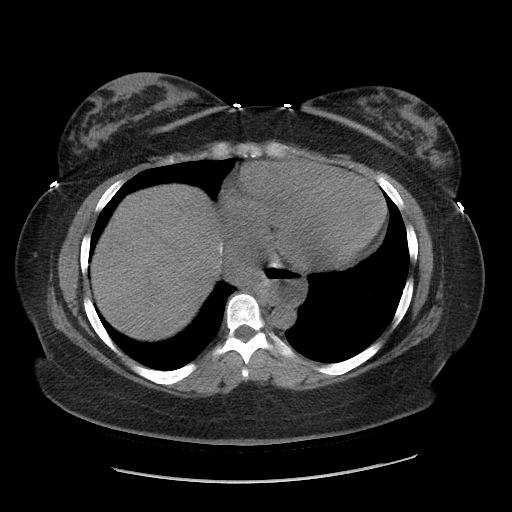
[im 81/91  lung]
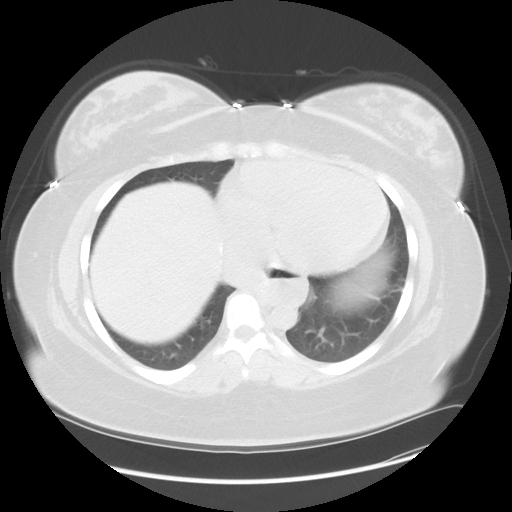

[Series 601: coronal body · coronal · 0.95mm/px · 1 of 134 slices shown, 2 images]
[im 45/134  soft-tissue]
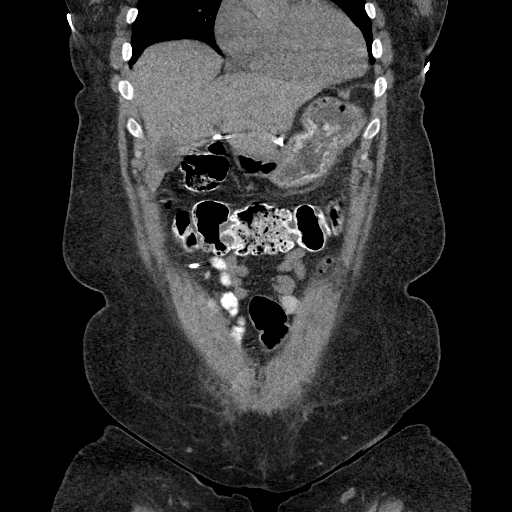
[im 45/134  bone]
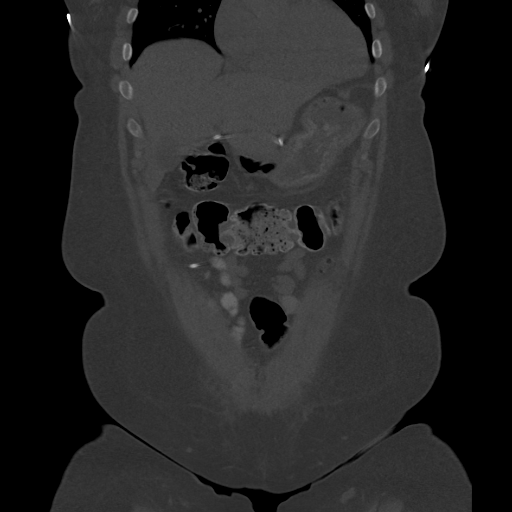

[Series 602: sagittal body · sagittal · 0.95mm/px · 4 of 169 slices shown]
[im 19/169  soft-tissue]
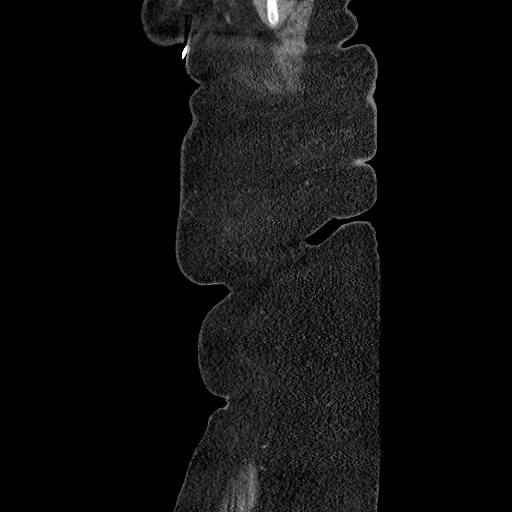
[im 38/169  soft-tissue]
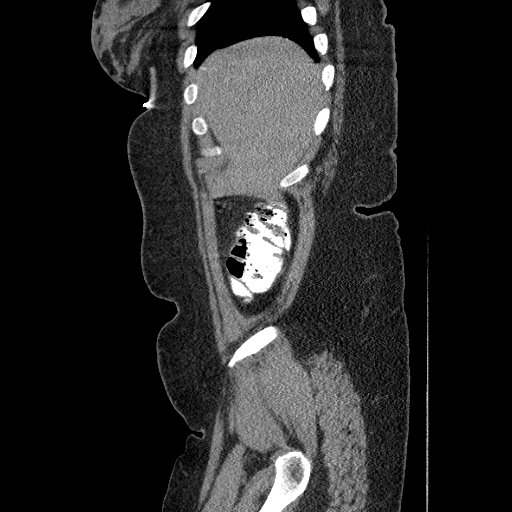
[im 57/169  soft-tissue]
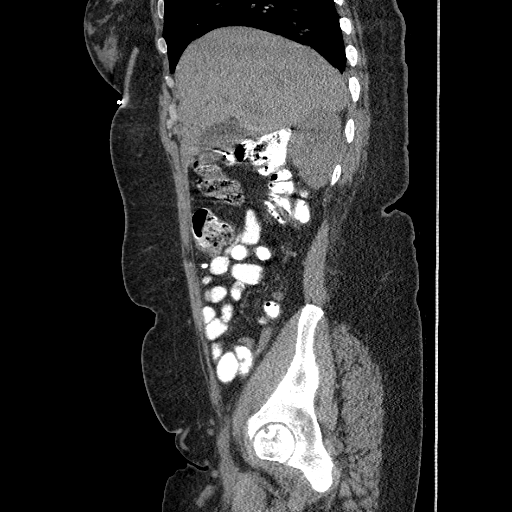
[im 75/169  soft-tissue]
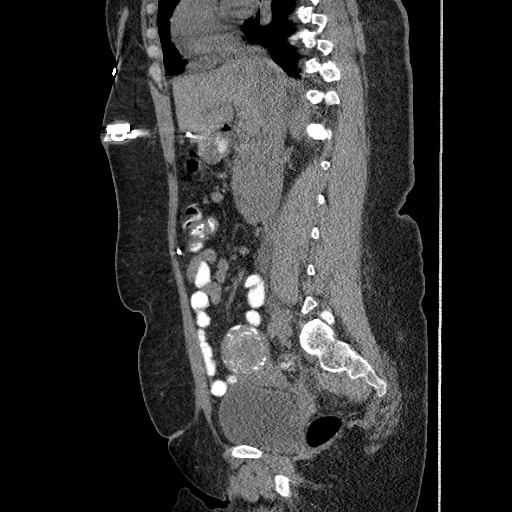

[13 of 36 positions shown; findings below may reference images not displayed]

FINDINGS: Heart is normal size. Moderate-sized hiatal hernia. Lung bases
clear. No effusions.

Lab band device is noted in place in the region of the proximal
stomach. Liver, gallbladder, spleen, pancreas, adrenals and kidneys
have an unremarkable unenhanced appearance. No renal or ureteral
stones. No hydronephrosis.

Calcified fibroid within the right uterine fundus measuring up to
4.6 cm. Smaller fibroid is along the left side of the uterus. No
adnexal masses.

The appendix is mildly prominent, measuring up to 10 mm. There is
slight haziness in the periappendiceal fat. Cannot exclude acute
appendicitis. Recommend clinical correlation.

No free fluid, free air or adenopathy. No acute bony abnormality.
IMPRESSION: Mildly prominent appendix measuring up to 10 mm with slight haziness
in the periappendiceal fat. Cannot exclude acute appendicitis.
Recommend clinical correlation.

Gastric lap band device in place.

Moderate-sized hiatal hernia.

Uterine fibroids.

These results will be called to the ordering clinician or
representative by the Radiologist Assistant, and communication
documented in the PACS Dashboard.

## 2012-09-12 NOTE — ED Notes (Signed)
Pt nursing. Aware of need for UA.

## 2012-09-12 NOTE — ED Notes (Signed)
Returned from US via wheelchair

## 2012-09-12 NOTE — ED Notes (Signed)
Pt seen by PCP for anemia and was sent to have a abd CT to "find out cause of anemia".  Pt states she had CT at [facility] today and was called and told to come to ED for ? Appendicitis.  Pt reports intermittent RLQ pain that started once discharged from hospital after c-section that she had on June 25th.

## 2012-09-13 MED ORDER — ONDANSETRON 8 MG PO TBDP: 8.0000 mg | ORAL_TABLET | Freq: Three times a day (TID) | ORAL | Status: DC | PRN

## 2012-09-13 NOTE — ED Provider Notes (Signed)
CSN: [id number]     Arrival date & time 09/12/12  1938 History     First MD Initiated Contact with Patient 09/12/12 1958     Chief Complaint  Patient presents with  . Abdominal Pain    HPI Patient has been having intermittent abdominal pain over the past several weeks.  She states the pain is intermittent and lasts for several minutes.  He makes her nauseated and vomit.  She's had nausea for many years.  She's been worked up by her primary care physician.  She does report some new right lower quadrant pain over the past several days and her primary care physician ordered a CT.  She was sent to the emergency department for questionable appendicitis with enlarged and mild stranding around the appendix.  She reports the majority of her pain however is located more in the right upper quadrant.  She denies urinary symptoms.  No cough or congestion.  No fevers or chills.  No recent anorexia.  She is 2 months status post cesarean section.  Said no complications from her C-section.  Past Medical History  Diagnosis Date  . No pertinent past medical history   . PONV (postoperative nausea and vomiting)     has had PONV with previous c/s and also lap band surgery 2004  . GERD (gastroesophageal reflux disease)     diet controlled  . Anemia     Has sickle cell trait  . Gestational diabetes     with current pregnancy only-diet controlled   Past Surgical History  Procedure Laterality Date  . Laparoscopic gastric banding  2005  . Cesarean section    . Gestational diabetes      with current pregnancy only- diet controlled  . Cesarean section N/A 07/16/2012    Procedure: REPEAT CESAREAN SECTION;  Surgeon: Oliver Pila, MD;  Location: [facility];  Service: Obstetrics;  Laterality: N/A;  . Bilateral salpingectomy  07/16/2012    Procedure: BILATERAL DISTAL SALPINGECTOMY;  Surgeon: Oliver Pila, MD;  Location: [facility];  Service: Obstetrics;;   Family History  Problem Relation Age of Onset  .  Diabetes      father's family  . Breast cancer Maternal Grandmother   . Hypertension Mother   . Thyroid cancer     History  Substance Use Topics  . Smoking status: Never Smoker   . Smokeless tobacco: Not on file  . Alcohol Use: No   OB History   Grav Para Term Preterm Abortions TAB SAB Ect Mult Living   3 3 2 1  0 0 0 0 1 4     Review of Systems  All other systems reviewed and are negative.    Allergies  Latex  Home Medications   Current Outpatient Rx  Name  Route  Sig  Dispense  Refill  . metoCLOPramide (REGLAN) 10 MG tablet   Oral   Take 1 tablet (10 mg total) by mouth 4 (four) times daily.   30 tablet   1   . Witch Hazel (PREPARATION H EX)   Apply externally   Apply 1 application topically daily as needed (for hemmorhoids).         . ondansetron (ZOFRAN ODT) 8 MG disintegrating tablet   Oral   Take 1 tablet (8 mg total) by mouth every 8 (eight) hours as needed for nausea.   10 tablet   0    BP 135/86  Pulse 89  Temp(Src) 98.3 F (36.8 C) (Oral)  Resp 20  SpO2 94%  Breastfeeding? Yes Physical Exam  Nursing note and vitals reviewed. Constitutional: She is oriented to person, place, and time. She appears well-developed and well-nourished. No distress.  HENT:  Head: Normocephalic and atraumatic.  Eyes: EOM are normal.  Neck: Normal range of motion.  Cardiovascular: Normal rate, regular rhythm and normal heart sounds.   Pulmonary/Chest: Effort normal and breath sounds normal.  Abdominal: Soft. She exhibits no distension.  Mild right upper quadrant tenderness.  Mild right lower quadrant tenderness.  No guarding or rebound.  No peritonitis  Musculoskeletal: Normal range of motion.  Neurological: She is alert and oriented to person, place, and time.  Skin: Skin is warm and dry.  Psychiatric: She has a normal mood and affect. Judgment normal.    ED Course   Procedures (including critical care time)  Labs Reviewed  CBC WITH DIFFERENTIAL - Abnormal;  Notable for the following:    Hemoglobin 10.6 (*)    HCT 33.0 (*)    MCV 64.6 (*)    MCH 20.7 (*)    RDW 22.1 (*)    Neutrophils Relative % 41 (*)    Lymphocytes Relative 49 (*)    Lymphs Abs 4.1 (*)    All other components within normal limits  COMPREHENSIVE METABOLIC PANEL - Abnormal; Notable for the following:    Potassium 3.3 (*)    Glucose, Bld 118 (*)    All other components within normal limits  URINALYSIS, ROUTINE W REFLEX MICROSCOPIC - Abnormal; Notable for the following:    APPearance CLOUDY (*)    Hgb urine dipstick LARGE (*)    Bilirubin Urine SMALL (*)    Protein, ur 30 (*)    Leukocytes, UA MODERATE (*)    All other components within normal limits  URINE MICROSCOPIC-ADD ON - Abnormal; Notable for the following:    Bacteria, UA FEW (*)    All other components within normal limits  URINE CULTURE  LIPASE, BLOOD  POCT PREGNANCY, URINE   Ct Abdomen Pelvis Wo Contrast  09/12/2012   CLINICAL DATA:  Chronic right upper quadrant pain, right flank pain. Nausea.  EXAM: CT ABDOMEN AND PELVIS WITHOUT CONTRAST  TECHNIQUE: Multidetector CT imaging of the abdomen and pelvis was performed following the standard protocol without intravenous contrast.  COMPARISON:  None.  FINDINGS: Heart is normal size. Moderate-sized hiatal hernia. Lung bases clear. No effusions.  Lab band device is noted in place in the region of the proximal stomach. Liver, gallbladder, spleen, pancreas, adrenals and kidneys have an unremarkable unenhanced appearance. No renal or ureteral stones. No hydronephrosis.  Calcified fibroid within the right uterine fundus measuring up to 4.6 cm. Smaller fibroid is along the left side of the uterus. No adnexal masses.  The appendix is mildly prominent, measuring up to 10 mm. There is slight haziness in the periappendiceal fat. Cannot exclude acute appendicitis. Recommend clinical correlation.  No free fluid, free air or adenopathy. No acute bony abnormality.  IMPRESSION: Mildly  prominent appendix measuring up to 10 mm with slight haziness in the periappendiceal fat. Cannot exclude acute appendicitis. Recommend clinical correlation.  Gastric lap band device in place.  Moderate-sized hiatal hernia.  Uterine fibroids.  These results will be called to the ordering clinician or representative by the Radiologist Assistant, and communication documented in the PACS Dashboard.   Electronically Signed   By: Charlett Nose   On: 09/12/2012 14:21   US Abdomen Complete  09/12/2012   *RADIOLOGY REPORT*  Clinical Data:  Nominal pain.  ABDOMINAL ULTRASOUND COMPLETE  Comparison:  09/01/2010  Findings:  Gallbladder:  Contracted without evidence of stones or wall thickening.  Negative sonographic Murphy's sign.  Common Bile Duct:  Within normal measuring 2.1 mm.  Liver: No focal mass lesion identified.  Within normal limits in parenchymal echogenicity.  IVC:  Appears normal.  Pancreas:  No abnormality identified.  Spleen:  Within normal limits in size and echotexture.  Right kidney:  Normal in size and parenchymal echogenicity.  No evidence of mass or hydronephrosis.  Measures 10.3 cm in diameter.  Left kidney:  Normal in size and parenchymal echogenicity.  No evidence of mass or hydronephrosis.  Measures 10.6 cm in diameter.  Abdominal Aorta:  No aneurysm identified.  IMPRESSION: Negative abdominal ultrasound.   Original Report Authenticated By: Elberta Fortis, M.D.   I personally reviewed the imaging tests through PACS system I reviewed available ER/hospitalization records through the EMR   1. Abdominal pain   2. Nausea     MDM  Patient's ultrasound demonstrates no evidence of cholelithiasis.  Her symptoms still sound like biliary colic with intermittent pain.  Given her abnormal CT and a questionable appendix on CT have asked her surgery to evaluate the patient.  I think general surgery will still need to follow this patient as an outpatient as I think she may benefit from cholecystectomy as her  symptoms sound more consistent with biliary colic.  I appreciate general surgery weighing in on her abnormal CT.  Lyanne Co, MD 09/13/12 [phone]

## 2012-09-14 LAB — URINE CULTURE: Colony Count: 40000

## 2012-09-16 ENCOUNTER — Ambulatory Visit (HOSPITAL_BASED_OUTPATIENT_CLINIC_OR_DEPARTMENT_OTHER): Payer: 59 | Admitting: Hematology and Oncology

## 2012-09-16 ENCOUNTER — Ambulatory Visit: Payer: 59

## 2012-09-16 ENCOUNTER — Ambulatory Visit (HOSPITAL_BASED_OUTPATIENT_CLINIC_OR_DEPARTMENT_OTHER): Payer: 59

## 2012-09-16 ENCOUNTER — Encounter: Payer: Self-pay | Admitting: Hematology and Oncology

## 2012-09-16 ENCOUNTER — Ambulatory Visit: Payer: 59 | Admitting: Family Medicine

## 2012-09-16 ENCOUNTER — Telehealth: Payer: Self-pay | Admitting: Hematology and Oncology

## 2012-09-16 VITALS — BP 141/96 | HR 68 | Temp 97.4°F | Resp 20 | Ht 64.0 in | Wt 214.4 lb

## 2012-09-16 DIAGNOSIS — D649 Anemia, unspecified: Secondary | ICD-10-CM

## 2012-09-16 DIAGNOSIS — R1011 Right upper quadrant pain: Secondary | ICD-10-CM

## 2012-09-16 LAB — IRON AND TIBC CHCC
Iron: 25 ug/dL — ABNORMAL LOW (ref 41–142)
TIBC: 408 ug/dL (ref 236–444)
UIBC: 383 ug/dL (ref 120–384)

## 2012-09-16 LAB — FERRITIN CHCC: Ferritin: 5 ng/ml — ABNORMAL LOW (ref 9–269)

## 2012-09-16 MED ORDER — FERROUS FUMARATE 324 MG PO TABS: 1.0000 | ORAL_TABLET | Freq: Two times a day (BID) | ORAL | Status: DC

## 2012-09-16 NOTE — Telephone Encounter (Signed)
Gave pt appt for lab and MD for September , sent pt to labs today

## 2012-09-16 NOTE — Progress Notes (Signed)
Checked in new pt with no financial concerns. °

## 2012-09-17 ENCOUNTER — Telehealth (INDEPENDENT_AMBULATORY_CARE_PROVIDER_SITE_OTHER): Payer: Self-pay | Admitting: General Surgery

## 2012-09-17 ENCOUNTER — Ambulatory Visit (INDEPENDENT_AMBULATORY_CARE_PROVIDER_SITE_OTHER): Payer: 59 | Admitting: General Surgery

## 2012-09-17 ENCOUNTER — Encounter (INDEPENDENT_AMBULATORY_CARE_PROVIDER_SITE_OTHER): Payer: Self-pay | Admitting: General Surgery

## 2012-09-17 VITALS — BP 132/80 | HR 76 | Resp 14 | Ht 64.0 in | Wt 214.4 lb

## 2012-09-17 DIAGNOSIS — R1011 Right upper quadrant pain: Secondary | ICD-10-CM

## 2012-09-17 NOTE — Patient Instructions (Signed)
HIDA (Hepatobiliary) Scan Your caregiver has suggested that you have a HIDA Scan. This is also known as a hepatobiliary scan. The HIDA Scan helps evaluate the hepatobiliary system (liver and gallbladder and their ducts). Your liver is the organ in your body that produces bile. The bile is then collected in the gallbladder. The bile is stored and concentrated in the gallbladder. The bile is excreted (passed) into the small intestine when it is needed for digestion. A stone can block the duct (tube) leading from the gallbladder to the small intestine. This can cause an inflammation of the gallbladder (cholecystitis). Because bile is always needed for fat processing, you may feel a gallbladder attack especially after eating a fatty meal. LET YOUR CAREGIVER KNOW ABOUT:  Allergies.  Medications taken including herbs, eye drops, over the counter medications, and creams.  Use of steroids (by mouth or creams).  Previous problems with anesthetics or novocaine.  Possibility of pregnancy, if this applies.  History of blood clots (thrombophlebitis).  History of bleeding or blood problems.  Previous surgery.  Other health problems. BEFORE THE PROCEDURE  Do not eat or drink anything after midnight the night before the exam as instructed.  You may take medications with a small amount of water the morning of the exam unless your caregiver instructs you otherwise. You should be present 60 minutes prior to your procedure or as directed.  PROCEDURE   An IV will be placed in your arm and remain throughout the exam.  A small amount of very short acting radioactive material will be injected into the IV.  While lying down a special camera will be placed over your abdomen (belly). This camera is used to detect the injected material. The camera will place images on film. A radiologist (specialist in reading x-rays) can evaluate the images. It will help determine how well your gallbladder is working.  You  will then be given a material called CCK. This will make your gallbladder contract. It occasionally causes symptoms (problems) that mimic a gallbladder attack or the feeling you have after eating a fatty meal.  The entire test usually takes one to two hours. Your caregiver can give you more accurate times. Following the test you may go home and resume normal activities and diet as instructed. Ask your caregiver how you are to find out your results. Remember, it is your responsibility to find out the results of your test. Do not assume everything is all right or "normal" if you have not heard from your caregiver. Document Released: 01/06/2000 Document Revised: 04/02/2011 Document Reviewed: 01/08/2005 [facility] Patient Information 2014 [place], [place].

## 2012-09-17 NOTE — Telephone Encounter (Signed)
Faxed medical release for op note, D/C summary, and any other notes from patient's lap band surgery on 03/07/02 to [facility] [phone] and confirmation received..sent around to med recds to be scanned in

## 2012-09-18 NOTE — Progress Notes (Signed)
Patient ID: Jamie Mccarthy, female   DOB: 06-06-1975, 37 y.o.   MRN: [id number]  Chief Complaint  Patient presents with  . New Evaluation    eval GB    HPI Jamie Mccarthy is a 37 y.o. female.   HPI 37 year old [place] American female referred by Dr. Patria Mane for evaluation for right upper quadrant pain and for possible cholecystectomy. The patient has a history of laparoscopic adjustable gastric band surgery at [facility] around 2005.She states that she's lost about 50 pounds since surgery. She states that her last followup was around 2006. She states that she had multiple episodes with the port becoming mispositioned and rotated requiring several trips back to the operating room for repositioning of her access port. She states that the port has probably flipped again. Nonetheless over the past couple months probably 10 months, she states that she's had some right upper quadrant pain radiating to her back. It generally occurs after eating. It will generally last 2-3 minutes before it subsides. However during that time it is actually very sharp and intense. She does get nauseous with it. She occasionally vomits. She denies any acholic stools. She states that her stools have been harder. She denies any melena or hematochezia. She denies any NSAID use. She does have a sickle trait and does have iron issues. She denies any regurgitation in the sense of food getting stuck.  Past Medical History  Diagnosis Date  . No pertinent past medical history   . PONV (postoperative nausea and vomiting)     has had PONV with previous c/s and also lap band surgery 2004  . GERD (gastroesophageal reflux disease)     diet controlled  . Anemia     Has sickle cell trait  . Gestational diabetes     with current pregnancy only-diet controlled    Past Surgical History  Procedure Laterality Date  . Laparoscopic gastric banding  2005  . Cesarean section    . Gestational diabetes      with current  pregnancy only- diet controlled  . Cesarean section N/A 07/16/2012    Procedure: REPEAT CESAREAN SECTION;  Surgeon: Oliver Pila, MD;  Location: [facility];  Service: Obstetrics;  Laterality: N/A;  . Bilateral salpingectomy  07/16/2012    Procedure: BILATERAL DISTAL SALPINGECTOMY;  Surgeon: Oliver Pila, MD;  Location: [facility];  Service: Obstetrics;;    Family History  Problem Relation Age of Onset  . Diabetes      father's family  . Breast cancer Maternal Grandmother   . Hypertension Mother   . Thyroid cancer      Social History History  Substance Use Topics  . Smoking status: Never Smoker   . Smokeless tobacco: Not on file  . Alcohol Use: No    Allergies  Allergen Reactions  . Latex Itching    Pt states she gets severely itchy with latex    Current Outpatient Prescriptions  Medication Sig Dispense Refill  . Ferrous Fumarate 324 MG TABS Take 1 tablet (106 mg of iron total) by mouth 2 (two) times daily.  60 each  1  . metoCLOPramide (REGLAN) 10 MG tablet Take 1 tablet (10 mg total) by mouth 4 (four) times daily.  30 tablet  1  . ondansetron (ZOFRAN ODT) 8 MG disintegrating tablet Take 1 tablet (8 mg total) by mouth every 8 (eight) hours as needed for nausea.  10 tablet  0  . Witch Hazel (PREPARATION H EX) Apply topically as  needed.       No current facility-administered medications for this visit.    Review of Systems Review of Systems  Constitutional: Negative for fever, chills and unexpected weight change.  HENT: Negative for hearing loss, congestion, sore throat, trouble swallowing and voice change.   Eyes: Negative for visual disturbance.  Respiratory: Negative for cough and wheezing.   Cardiovascular: Negative for chest pain, palpitations and leg swelling.  Gastrointestinal: Positive for nausea, vomiting, abdominal pain and constipation. Negative for diarrhea, blood in stool, abdominal distention and anal bleeding.  Genitourinary: Negative for hematuria,  vaginal bleeding and difficulty urinating.  Musculoskeletal: Negative for arthralgias.  Skin: Negative for rash and wound.  Neurological: Negative for seizures, syncope and headaches.  Hematological: Negative for adenopathy. Does not bruise/bleed easily.  Psychiatric/Behavioral: Negative for confusion.    Blood pressure 132/80, pulse 76, resp. rate 14, height 5\' 4"  (1.626 m), weight 214 lb 6.4 oz (97.251 kg).  Physical Exam Physical Exam  Vitals reviewed. Constitutional: She is oriented to person, place, and time. She appears well-developed and well-nourished. No distress.  Morbidly obese  HENT:  Head: Normocephalic and atraumatic.  Right Ear: External ear normal.  Left Ear: External ear normal.  Eyes: Conjunctivae are normal. No scleral icterus.  Neck: Normal range of motion. Neck supple. No tracheal deviation present. No thyromegaly present.  Cardiovascular: Normal rate and normal heart sounds.   Pulmonary/Chest: Effort normal and breath sounds normal. No stridor. No respiratory distress. She has no wheezes.  Abdominal: Soft. She exhibits no distension. There is no rebound and no guarding.  Well-healed surgical incisions. The patient's port is palpable in the epigastrium. She is slightly tender to palpation around the port site. However there is no erythema or cellulitis or induration or fluctuance.  Musculoskeletal: She exhibits no edema and no tenderness.  Lymphadenopathy:    She has no cervical adenopathy.  Neurological: She is alert and oriented to person, place, and time. She exhibits normal muscle tone.  Skin: Skin is warm and dry. No rash noted. She is not diaphoretic. No erythema.  Psychiatric: She has a normal mood and affect. Her behavior is normal. Judgment and thought content normal.    Data Reviewed ED note 8/22 Dr Linford Arnold note 08/22/12 Cbc ok, lfts ok abd u/s 8/22 - contracted GB, cbd ok CT abd/pelv 8/22 - reviewed myself  EXAM:  CT ABDOMEN AND PELVIS WITHOUT  CONTRAST  TECHNIQUE:  Multidetector CT imaging of the abdomen and pelvis was performed  following the standard protocol without intravenous contrast.  COMPARISON: None.  FINDINGS:  Heart is normal size. Moderate-sized hiatal hernia. Lung bases  clear. No effusions.  Lab band device is noted in place in the region of the proximal  stomach. Liver, gallbladder, spleen, pancreas, adrenals and kidneys  have an unremarkable unenhanced appearance. No renal or ureteral  stones. No hydronephrosis.  Calcified fibroid within the right uterine fundus measuring up to  4.6 cm. Smaller fibroid is along the left side of the uterus. No  adnexal masses.  The appendix is mildly prominent, measuring up to 10 mm. There is  slight haziness in the periappendiceal fat. Cannot exclude acute  appendicitis. Recommend clinical correlation.  No free fluid, free air or adenopathy. No acute bony abnormality.  IMPRESSION:  Mildly prominent appendix measuring up to 10 mm with slight haziness  in the periappendiceal fat. Cannot exclude acute appendicitis.  Recommend clinical correlation.  Gastric lap band device in place.  Moderate-sized hiatal hernia.  Uterine fibroids.  These results will be called to the ordering clinician or  representative by the Radiologist Assistant, and communication  documented in the PACS Dashboard.    Assessment    Post-prandial RUQ pain Hx of laparoscopic adjustable gastric band placement 2005  Morbid obesity    Plan    Unfortunately we do not have any of her records from [facility]. On CT imaging it appears that her lap band is in good position. There does not appear to be any signs of slippage. I have a very minimal or low concern of anteversion. However it does appear that her port has rotated.  Some of her symptoms are consistent with gallbladder disease. However she has no signs of gallstones on ultrasound or on CT imaging.  I recommended a HIDA scan to  evaluate her gallbladder function. In the interim we will obtain her records from [facility] so that I can review them. We will bring her back in for followup. If her HIDA scan is normal, My recommendation would be to try to access her lap band port which may be challenging and drain her band and she how she does. She is unsure how much fluid she has been there. Moreover there appears to be signs of a hiatal hernia on her CT. If her gallbladder workup appears to be negative then we may have to workup that.   Jamie Mccarthy. Andrey Campanile, MD, FACS General, Bariatric, & Minimally Invasive Surgery [facility], [place]        [facility] M 09/18/2012, 5:42 PM

## 2012-09-24 ENCOUNTER — Ambulatory Visit: Payer: 59 | Admitting: Family Medicine

## 2012-09-24 DIAGNOSIS — Z0289 Encounter for other administrative examinations: Secondary | ICD-10-CM

## 2012-09-25 ENCOUNTER — Encounter (HOSPITAL_COMMUNITY)
Admission: RE | Admit: 2012-09-25 | Discharge: 2012-09-25 | Disposition: A | Discharge status: Home / Self Care | Payer: 59 | Source: Ambulatory Visit | Attending: General Surgery | Admitting: General Surgery

## 2012-09-25 DIAGNOSIS — R1011 Right upper quadrant pain: Secondary | ICD-10-CM | POA: Insufficient documentation

## 2012-09-25 IMAGING — NM NM HEPATO W/GB/PHARM/[PERSON_NAME]
3 series · 13 of 13 positions shown · non-contrast
Comparison: None

CLINICAL DATA: Right upper quadrant pain radiating to the right
side and back, nausea, vomiting, fatty food intolerance

NUCLEAR MEDICINE HEPATOBILIARY IMAGING WITH GALLBLADDER EF:
TECHNIQUE: Sequential images of the abdomen were obtained for 60
minutes following intravenous administration of
radiopharmaceutical.  Patient then ingested 8 ounces of
commercially available Ensure Plus and imaging was continued for 60
minutes.  A time-activity curve was generated from tracer within
the gallbladder following Ensure Plus ingestion, and the
gallbladder ejection fraction was calculated.
Radiopharmaceutical:  5 mCi [LA] mebrofenin

[Series 0: hepatobiliary · 3.20mm/px · 6 of 60 frames shown (1 of 3)]
[frame 6/60]
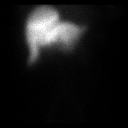
[frame 16/60]
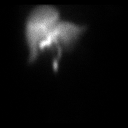
[frame 26/60]
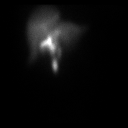
[frame 36/60]
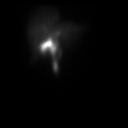
[frame 46/60]
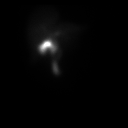
[frame 56/60]
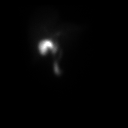

[Series 0: hepatobiliary · 3.20mm/px · 6 of 60 frames shown (2 of 3)]
[frame 6/60]
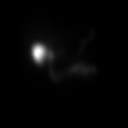
[frame 16/60]
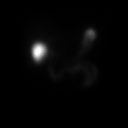
[frame 26/60]
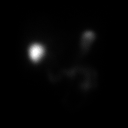
[frame 36/60]
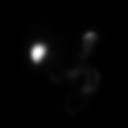
[frame 46/60]
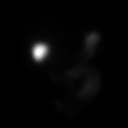
[frame 56/60]
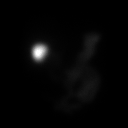

[Series 0: hepatobiliary · 1 of 1 slices shown (3 of 3)]
[im 1/1]
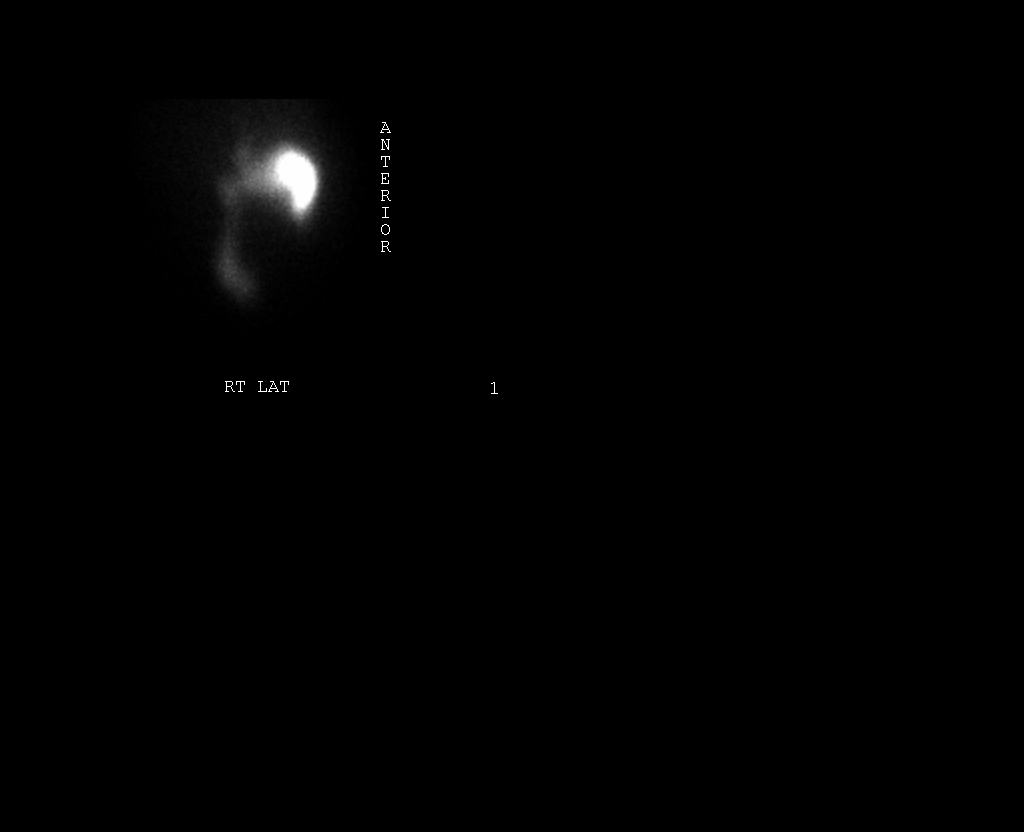

[13 of 13 positions shown; findings below may reference images not displayed]

FINDINGS: Prompt tracer extraction from bloodstream, indicating normal
hepatocellular function.
Prompt excretion of tracer into biliary tree.
Gallbladder visualized at 10 minutes.
Small bowel visualized at 16 minutes.
No hepatic retention of tracer.

Essentially no emptying of tracer from gallbladder following fatty
meal stimulation.
Calculated gallbladder ejection fraction is 4%, markedly decreased.
Patient experienced right upper quadrant pain, back pain, nausea
and vomiting following fatty meal ingestion. following fatty meal
ingestion.
IMPRESSION: Patent biliary tree.
Abnormal gallbladder response to fatty meal stimulation with a
markedly decreased gallbladder ejection fraction of 4%.
Re-creation of patient's symptoms following fatty meal ingestion.

Normal values for gallbladder ejection fraction:
> 30% for exams utilizing sincalide (CCK)
> 33% for exams utilizing fatty meal stimulation with Ensure Plus

## 2012-09-25 MED ORDER — TECHNETIUM TC 99M MEBROFENIN IV KIT
5.0000 | PACK | Freq: Once | INTRAVENOUS | Status: AC | PRN
  Administered 2012-09-25: 5 via INTRAVENOUS

## 2012-09-26 ENCOUNTER — Telehealth (INDEPENDENT_AMBULATORY_CARE_PROVIDER_SITE_OTHER): Payer: Self-pay | Admitting: General Surgery

## 2012-09-26 NOTE — Telephone Encounter (Signed)
Message copied by June Leap on Fri Sep 26, 2012  3:39 PM ------      Message from: Andrey Campanile, ERIC M      Created: Fri Sep 26, 2012  9:29 AM       pls call pt - HIDA scan shows gallbladder does not work properly. Will discuss surgery at follow up appt. ------

## 2012-09-26 NOTE — Telephone Encounter (Signed)
Called and gave patient hida scan results and she ask if we had received the records from [facility] about her lap band surgery from 03/07/02, I told her that we had not yet received any info but I would call and check the status..per Bonita Quin from [facility] she stated that they didn't have a patient by that name or DOB...LMOM for patient to call me back about this 9/5/@3 :37

## 2012-10-02 ENCOUNTER — Ambulatory Visit (INDEPENDENT_AMBULATORY_CARE_PROVIDER_SITE_OTHER): Payer: 59 | Admitting: General Surgery

## 2012-10-02 ENCOUNTER — Encounter (INDEPENDENT_AMBULATORY_CARE_PROVIDER_SITE_OTHER): Payer: Self-pay | Admitting: General Surgery

## 2012-10-02 VITALS — BP 138/80 | HR 72 | Resp 16 | Ht 64.0 in | Wt 210.6 lb

## 2012-10-02 DIAGNOSIS — K8021 Calculus of gallbladder without cholecystitis with obstruction: Secondary | ICD-10-CM

## 2012-10-02 DIAGNOSIS — Z9884 Bariatric surgery status: Secondary | ICD-10-CM

## 2012-10-02 DIAGNOSIS — IMO0001 Reserved for inherently not codable concepts without codable children: Secondary | ICD-10-CM | POA: Insufficient documentation

## 2012-10-02 DIAGNOSIS — K8050 Calculus of bile duct without cholangitis or cholecystitis without obstruction: Secondary | ICD-10-CM | POA: Insufficient documentation

## 2012-10-02 DIAGNOSIS — R1110 Vomiting, unspecified: Secondary | ICD-10-CM

## 2012-10-02 DIAGNOSIS — K8020 Calculus of gallbladder without cholecystitis without obstruction: Secondary | ICD-10-CM

## 2012-10-02 NOTE — Patient Instructions (Signed)
I will call you after your Upper GI to discuss results and formalize plan  1. Stay on liquids for the next 1- 2 days as you adapt to your new fill volume.  Then resume your previous diet. 2. Decreasing your carbohydrate intake will hasten you weight loss.  Rely more on proteins for your meals.  Avoid condiments that contain sweets such as Honey Mustard and sugary salad dressings.   3. Stay in the "green zone".  If you are regurgitating with meals, having night time reflux, and find yourself eating soft comfort foods (mashed potatoes, potato chips)...realize that you are developing "maladaptive eating".  You will not lose weight this way and may regain weight.  The GREEN ZONE is eating smaller portions and not regurgitating.  Hence we may need to withdraw fluid from your band. 4. Build exercise into your daily routine.  Walking is the best way to start but do something every day if you can.    Eating techniques 20-20-20 (30-30-30) 20 chews, 20 seconds between bites of food, 20 minutes to eat; sometimes you may need 30 chews, 30 seconds etc Use your nondominant hand to eat with Use a child/infant size utensil Try not to eat while watching TV

## 2012-10-02 NOTE — Progress Notes (Signed)
Subjective:     Patient ID: Jamie Mccarthy, female   DOB: [birth year], 37 y.o.   MRN: [id number]  HPI 37 year old [place] American female comes in for brief interval followup. I initially met her  09/17/2012. She complains of right upper quadrant pain. It generally occurs after eating and radiates to her back. Her ultrasound was negative. We ordered a HIDA scan to evaluate her gallbladder function. Her gallbladder ejection fraction is abnormally low at around 4% and moreover it reproduce her symptoms along with nausea and vomiting. She came back in to discuss the results of the tests.  At her last visit we also learned that she did have a laparoscopic adjustable gastric band surgery at [facility] in 2005. She had multiple problems with her port flipping requiring it to be repositioned several times. We do not have records at that time. Since that last visit been able to obtain some records from [place]. The patient states that she has been having several episodes of regurgitation throughout the week. This has been long-standing and ongoing. She states that often the food will get stuck and sits there before it eventually goes down. She states that this is how she thought it was supposed to be. She also complains of nighttime reflux. She denies any cough. She has lost about 50 pounds since her weight loss surgery in 2005. She states her port is very tender to touch  PMHx, PSHx, SOCHx, FAMHx, ALL reviewed and unchanged  NUCLEAR MEDICINE HEPATOBILIARY IMAGING WITH GALLBLADDER EF: 09/17/12 Technique: Sequential images of the abdomen were obtained for 60  minutes following intravenous administration of  radiopharmaceutical. Patient then ingested 8 ounces of  commercially available Ensure Plus and imaging was continued for 60  minutes. A time-activity curve was generated from tracer within  the gallbladder following Ensure Plus ingestion, and the  gallbladder ejection fraction was calculated.  Radiopharmaceutical: 5  mCi Tc-17m mebrofenin   Comparison: None  Findings:  Prompt tracer extraction from bloodstream, indicating normal  hepatocellular function.  Prompt excretion of tracer into biliary tree.  Gallbladder visualized at 10 minutes.  Small bowel visualized at 16 minutes.  No hepatic retention of tracer.  Essentially no emptying of tracer from gallbladder following fatty  meal stimulation.  Calculated gallbladder ejection fraction is 4%, markedly decreased.  Patient experienced right upper quadrant pain, back pain, nausea  and vomiting following fatty meal ingestion. following fatty meal  ingestion.   IMPRESSION:  Patent biliary tree.  Abnormal gallbladder response to fatty meal stimulation with a  markedly decreased gallbladder ejection fraction of 4%.  Re-creation of patient's symptoms following fatty meal ingestion.  Normal values for gallbladder ejection fraction:  > 30% for exams utilizing sincalide (CCK)  > 33% for exams utilizing fatty meal stimulation with Ensure Plus   Review of Systems 10 point review of systems is performed and all systems are negative except for what is mentioned in the history of present illness    Objective:   Physical Exam BP 138/80  Pulse 72  Resp 16  Ht 5\' 4"  (1.626 m)  Wt 210 lb 9.6 oz (95.528 kg)  BMI 36.13 kg/m2  See LapBand flowsheet  Gen: alert, NAD, non-toxic appearing HEENT: normocephalic, atraumatic; pupils equal, no scleral icterus, Pulm: Lungs clear to auscultation, symmetric chest rise CV: regular rate and rhythm Abd: soft, nontender, nondistended. Well-healed trocar sites with hypertrophic scars. No incisional hernia. Port is in RUQ - subxiphoid position and flipped - sticking sideways Ext: no edema, normal, symmetric strength  Neuro: nonfocal,t Psych: appropriate, judgment normal     Assessment:     Biliary dyskinesia Flipped LapBand Port Regurgitation H/o LAGB 2005 @ [facility]     Plan:     The patient  physically has findings and symptoms consistent with biliary dyskinesia. However she also has several active issues with respect to her laparoscopic  Adjustable  gastric band. Her port is clearly flipped. It is causing some tenderness to palpation. There is no sign of cellulitis I don't think there is any evidence of band erosion or port site infection. The CT scan that she had previously confirms the physical exam finding of a flipped band port. Her CT scan also demonstrates evidence of a decent size hiatal hernia on CT imaging.   I do believe the patient would benefit from cholecystectomy with respect to her postprandial right upper quadrant pain and nausea; however, the patient is also experiencing frequent regurgitation throughout the week. It is unclear if it is because she has too much fluid in her lap band or because of her hiatal hernia.She stated her band was last adjusted in 2005. She thought that she had no fluid in her band.  Because she is having frequent regurgitation despite correct eating behaviors, I recommended accessing her band port to see if she had any fluid in it.  After obtaining verbal consent, the abdominal wall was prepped with Chloraprep. The port was accessed with a Huber needle by pressing it down b/t 2 fingers to make it flat and 9.8 cc of saline was removed to give the patient an expected fill volume of 0 cc.  The patient was able to tolerate sips of water. She was instructed to stay on liquids for the next 24-48 hours. We also re-emphasized proper eating techniques.  I have recommended getting an upper GI to further delineate her esophageal and upper stomach anatomy to try to determine how large hiatal hernia she has as well as to delineate any dysmotility or gastroesophageal reflux. I also want to give her a "band vacation " To see if her regurgitation improves.  I will call her once we have the results of the upper GI and discuss a treatment course. One option is to just  do a laparoscopic cholecystectomy. Another option is to do a combined procedure if in fact it looks like her Hiatal hernia is large and symptomatic By doing a laparoscopic cholecystectomy with laparoscopic revision of her laparoscopic adjustable gastric band along with hiatal hernia repair With repositioning of her lap band port. Another option would be to do a staged surgical intervention. It is to same to formalize the plan. Need to get an upper GI to look at the results and then have an informed discussion.   Mary Sella. Andrey Campanile, MD, FACS General, Bariatric, & Minimally Invasive Surgery [facility], [place]

## 2012-10-03 NOTE — Progress Notes (Signed)
ID: Jamie Mccarthy OB: [birth year]  MR#: [id number]  WUJ#:[id number]  [facility]  [phone] [phone]    INITIAL HEMATOLOGY CONSULTATION    Referral MD:   Nani Gasser, MD  Reason for Referral: 1.Sickle cell trait     2. Iron deficiency anemia.  HISTORY OF PRESENT ILLNESS: The patient is a 37 y.o. Female who was sent to clinic because of anemia. She has iron deficiency at least since 08/13/2012 (Ferritin 4). Se also had  vitamin B12 deficiency (on 08/14/10 153). She has sickle cell trait. Patient presented today first time to clinic. Her appetite is O.K. And she gain some weight. Patient complains on mild night sweats around neck, sometimes headache, nasal discharge in am, episodes of sharp low abdominal pain radiated to her back, constipation up to one week. She has episodes of nausea and vomiting. The patient denied fever, chills. She denied  double vision, blurry vision, nasal congestion, nasal discharge, hearing problems, odynophagia or dysphagia. No chest pain, palpitations, dyspnea, cough, diarrhea, hematochezia. The patient denied dysuria, nocturia, polyuria, hematuria, myalgia, numbness, tingling, psychiatric problems.  Review of Systems  Constitutional: Negative for fever, chills, weight loss, malaise/fatigue and diaphoresis.  HENT: Negative for hearing loss, ear pain, nosebleeds, congestion, sore throat, neck pain and tinnitus.   Eyes: Negative for blurred vision, double vision, photophobia and pain.  Respiratory: Negative for cough, hemoptysis, sputum production, shortness of breath and wheezing.   Cardiovascular: Negative for chest pain, palpitations, orthopnea, claudication, leg swelling and PND.  Gastrointestinal: Positive for nausea, abdominal pain and constipation. Negative for heartburn, vomiting, diarrhea, blood in stool and melena.  Genitourinary: Negative for dysuria, urgency, frequency, hematuria and flank pain.  Musculoskeletal:  Negative for myalgias, back pain, joint pain and falls.  Skin: Negative for itching and rash.  Neurological: Positive for headaches. Negative for dizziness, tingling, tremors, sensory change, speech change, focal weakness, seizures, loss of consciousness and weakness.  Endo/Heme/Allergies: Does not bruise/bleed easily.  Psychiatric/Behavioral: Negative.      PAST MEDICAL HISTORY: Past Medical History  Diagnosis Date  . No pertinent past medical history   . PONV (postoperative nausea and vomiting)     has had PONV with previous c/s and also lap band surgery 2004  . GERD (gastroesophageal reflux disease)     diet controlled  . Anemia     Has sickle cell trait  . Gestational diabetes     with current pregnancy only-diet controlled    PAST SURGICAL HISTORY: Past Surgical History  Procedure Laterality Date  . Laparoscopic gastric banding  2005  . Cesarean section    . Gestational diabetes      with current pregnancy only- diet controlled  . Cesarean section N/A 07/16/2012    Procedure: REPEAT CESAREAN SECTION;  Surgeon: Oliver Pila, MD;  Location: [facility];  Service: Obstetrics;  Laterality: N/A;  . Bilateral salpingectomy  07/16/2012    Procedure: BILATERAL DISTAL SALPINGECTOMY;  Surgeon: Oliver Pila, MD;  Location: [facility];  Service: Obstetrics;;    FAMILY HISTORY Family History  Problem Relation Age of Onset  . Diabetes      father's family  . Breast cancer Maternal Grandmother   . Hypertension Mother   . Thyroid cancer     HEALTH MAINTENANCE: History  Substance Use Topics  . Smoking status: Never Smoker   . Smokeless tobacco: Not on file  . Alcohol Use: No    Allergies  Allergen Reactions  . Latex Itching    Pt  states she gets severely itchy with latex    Current Outpatient Prescriptions  Medication Sig Dispense Refill  . metoCLOPramide (REGLAN) 10 MG tablet Take 1 tablet (10 mg total) by mouth 4 (four) times daily.  30 tablet  1  . Witch Hazel  (PREPARATION H EX) Apply topically as needed.      . Ferrous Fumarate 324 MG TABS Take 1 tablet (106 mg of iron total) by mouth 2 (two) times daily.  60 each  1  . ondansetron (ZOFRAN ODT) 8 MG disintegrating tablet Take 1 tablet (8 mg total) by mouth every 8 (eight) hours as needed for nausea.  10 tablet  0   No current facility-administered medications for this visit.    OBJECTIVE: Filed Vitals:   09/16/12 1114  BP: 141/96  Pulse: 68  Temp: 97.4 F (36.3 C)  Resp: 20     Body mass index is 36.78 kg/(m^2).    ECOG FS: 0  PHYSICAL EXAMINATION:  HEENT: Sclerae anicteric.  Conjunctivae were pink. Pupils round and reactive bilaterally. Oral mucosa is moist without ulceration or thrush. No occipital, submandibular, cervical, supraclavicular or axillar adenopathy. Lungs: clear to auscultation without wheezes. No rales or rhonchi. Heart: regular rate and rhythm. No murmur, gallop or rubs. Abdomen: soft. Positive for RUQ tenderness. No guarding or rebound tenderness. Bowel sounds are present. No palpable hepatosplenomegaly. MSK: no focal spinal tenderness. Extremities: No clubbing or cyanosis.No calf tenderness to palpitation, no peripheral edema. The patient had grossly intact strength in upper and lower extremities. Skin exam was without ecchymosis, petechiae. Neuro: non-focal, alert and oriented to time, person and place, appropriate affect  LAB RESULTS:  CMP     Component Value Date/Time   NA 139 09/12/2012 1949   K 3.3* 09/12/2012 1949   CL 104 09/12/2012 1949   CO2 27 09/12/2012 1949   GLUCOSE 118* 09/12/2012 1949   BUN 10 09/12/2012 1949   CREATININE 0.80 09/12/2012 1949   CREATININE 0.65 05/07/2011 1029   CALCIUM 9.3 09/12/2012 1949   PROT 7.4 09/12/2012 1949   ALBUMIN 3.8 09/12/2012 1949   AST 18 09/12/2012 1949   ALT 10 09/12/2012 1949   ALKPHOS 80 09/12/2012 1949   BILITOT 0.4 09/12/2012 1949   GFRNONAA >90 09/12/2012 1949   GFRAA >90 09/12/2012 1949    Lab Results  Component  Value Date   WBC 8.3 09/12/2012   NEUTROABS 3.4 09/12/2012   HGB 10.6* 09/12/2012   HCT 33.0* 09/12/2012   MCV 64.6* 09/12/2012   PLT 327 09/12/2012      Chemistry      Component Value Date/Time   NA 139 09/12/2012 1949   K 3.3* 09/12/2012 1949   CL 104 09/12/2012 1949   CO2 27 09/12/2012 1949   BUN 10 09/12/2012 1949   CREATININE 0.80 09/12/2012 1949   CREATININE 0.65 05/07/2011 1029      Component Value Date/Time   CALCIUM 9.3 09/12/2012 1949   ALKPHOS 80 09/12/2012 1949   AST 18 09/12/2012 1949   ALT 10 09/12/2012 1949   BILITOT 0.4 09/12/2012 1949      Urinalysis    Component Value Date/Time   COLORURINE YELLOW 09/12/2012 2130   APPEARANCEUR CLOUDY* 09/12/2012 2130   LABSPEC 1.016 09/12/2012 2130   PHURINE 5.5 09/12/2012 2130   GLUCOSEU NEGATIVE 09/12/2012 2130   HGBUR LARGE* 09/12/2012 2130   HGBUR large 06/22/2008 0850   BILIRUBINUR SMALL* 09/12/2012 2130   KETONESUR NEGATIVE 09/12/2012 2130   PROTEINUR 30* 09/12/2012 2130  UROBILINOGEN 1.0 09/12/2012 2130   NITRITE NEGATIVE 09/12/2012 2130   LEUKOCYTESUR MODERATE* 09/12/2012 2130    STUDIES: Ct Abdomen Pelvis Wo Contrast  09/12/2012   CLINICAL DATA:  Chronic right upper quadrant pain, right flank pain. Nausea.  EXAM: CT ABDOMEN AND PELVIS WITHOUT CONTRAST  TECHNIQUE: Multidetector CT imaging of the abdomen and pelvis was performed following the standard protocol without intravenous contrast.  COMPARISON:  None.  FINDINGS: Heart is normal size. Moderate-sized hiatal hernia. Lung bases clear. No effusions.  Lab band device is noted in place in the region of the proximal stomach. Liver, gallbladder, spleen, pancreas, adrenals and kidneys have an unremarkable unenhanced appearance. No renal or ureteral stones. No hydronephrosis.  Calcified fibroid within the right uterine fundus measuring up to 4.6 cm. Smaller fibroid is along the left side of the uterus. No adnexal masses.  The appendix is mildly prominent, measuring up to 10 mm. There is  slight haziness in the periappendiceal fat. Cannot exclude acute appendicitis. Recommend clinical correlation.  No free fluid, free air or adenopathy. No acute bony abnormality.  IMPRESSION: Mildly prominent appendix measuring up to 10 mm with slight haziness in the periappendiceal fat. Cannot exclude acute appendicitis. Recommend clinical correlation.  Gastric lap band device in place.  Moderate-sized hiatal hernia.  Uterine fibroids.  These results will be called to the ordering clinician or representative by the Radiologist Assistant, and communication documented in the PACS Dashboard.   Electronically Signed   By: Charlett Nose   On: 09/12/2012 14:21   US Abdomen Complete  09/12/2012   *RADIOLOGY REPORT*  Clinical Data:  Nominal pain.  ABDOMINAL ULTRASOUND COMPLETE  Comparison:  09/01/2010  Findings:  Gallbladder:  Contracted without evidence of stones or wall thickening.  Negative sonographic Murphy's sign.  Common Bile Duct:  Within normal measuring 2.1 mm.  Liver: No focal mass lesion identified.  Within normal limits in parenchymal echogenicity.  IVC:  Appears normal.  Pancreas:  No abnormality identified.  Spleen:  Within normal limits in size and echotexture.  Right kidney:  Normal in size and parenchymal echogenicity.  No evidence of mass or hydronephrosis.  Measures 10.3 cm in diameter.  Left kidney:  Normal in size and parenchymal echogenicity.  No evidence of mass or hydronephrosis.  Measures 10.6 cm in diameter.  Abdominal Aorta:  No aneurysm identified.  IMPRESSION: Negative abdominal ultrasound.   Original Report Authenticated By: Elberta Fortis, M.D.    ASSESSMENT AND PLAN: 1. Anemia. Combination of sickle cell trait, iron deficiency and history of vitamin B12 deficiency. I will request Hgb electrophoresis record with documentation of sickle cell trait. I will repeat iron panel. Will start ferrous fumarate 324 mg po bid History of vitamin B12 injections. I will check serum vitamin  B12. 2. RUQ abdominal tenderness. Follow by PCP. 3. Follow up in 1 months  Myra Rude, MD   10/03/2012 4:08 AM

## 2012-10-07 ENCOUNTER — Telehealth: Payer: Self-pay | Admitting: *Deleted

## 2012-10-07 ENCOUNTER — Encounter (INDEPENDENT_AMBULATORY_CARE_PROVIDER_SITE_OTHER): Payer: Self-pay

## 2012-10-07 NOTE — Telephone Encounter (Signed)
sw pt informed her that she is scheduled for a B12 inj during her ov on 10/17/12 @ 9:45am....td

## 2012-10-08 ENCOUNTER — Ambulatory Visit (HOSPITAL_COMMUNITY)
Admission: RE | Admit: 2012-10-08 | Discharge: 2012-10-08 | Disposition: A | Discharge status: Home / Self Care | Payer: 59 | Source: Ambulatory Visit | Attending: Obstetrics and Gynecology | Admitting: Obstetrics and Gynecology

## 2012-10-08 NOTE — Lactation Note (Addendum)
Adult Lactation Consultation Outpatient Visit Note  Patient Name: Jamie Mccarthy Date of Birth: Dec 24, 1975 Gestational Age at Delivery: Unknown Type of Delivery: Vaginal delivery 07/16/2012 - Baby Girl "[place]"                                                       - Birth weight - 6-12 oz                                                       - Most recent weight - 9/4  9-8 oz  Reason for Lactation visit today - Baby not latching deep enough - causing sore nipples last 3 weeks                                                      _ per mom has a history of yeast , but none this pregnancy                                                      - preparation for Gall bladder surgery , possible in 1 week and transitioning back to work in Oct. Per mom                                                  Breastfeeding History: Per mom breast feeding is going well ,except noted volume decreasing  Due to baby not latching as deeply as she once did. Also has been pumping.  Frequency of Breastfeeding: every 3-4 hours ( 8-10 's a day  Length of Feeding: 7-8 mins a breast , quick let down and baby satisfied  Voids: >6 Stools: stool once a day yellowish seedy   Supplementing / Method: per mom when baby has to have a bottle takes it well and takes up to 4 oz of EBM  Pumping:  Type of Pump: DEBP Medela    Frequency: 2 x's a day morning and afternoon before baby feeds   Volume:  3 oz eaxh =6 oz total at a pumping   Comments:Per mom comfortable with flange     Consultation Evaluation: Both breast are full ( not engorged ) . Both nipples are pink ,                                              no breakdown, or signs of yeast infection noted. Baby alert and hungry,  And healthy in appearance for a 28 month old.   Initial Feeding Assessment: Pre-feed Weight: 9-12.9 oz 4448 g  Post-feed Weight: 9-14.2 oz 4486 g  Amount Transferred: 38 ml  Comments: Watched mom latch  baby and noted shallow latch, LC worked with mom to  Obtain the depth at the breast. Per mom much more comfortable and baby fed in a consistent pattern for 20 mins,  Increased with breast compressions.   Additional Feeding Assessment: Pre-feed Weight: 9-14.2 oz 4486 g  Post-feed Weight:9-14.7 oz 4498 g  Amount Transferred:12 ml  Comments: Latched well with depth on the 2nd breast, noted a consistent pattern with multiply swallows, increased with breast compressions. Baby ended feeding and fell asleep. Per mom comfortable with latch.   Total Breast milk Transferred this Visit: 50 ml  Total Supplement Given: None   Lactation Plan of Care - Praised mom for her great efforts breast feeding her baby                                         - Work with [place] to obtain the depth at the breast, may need                                          to hand express of the fullness 1st or pre-pump so she doesn't pull back.                                         - Transitioning back to work and for Gallbladder surgery in the next week.                                          - When latching work on using the breast compressions to obtain the depth , to decrease soreness                                           Pumping - after feeding                                                            - 10 -20 mins both breast                                                            - don't watch bottles while pumping                                                            -  try to increase flange #27                                                            - hand express after pumping                   Bottle Nipple - Change to faster flow similar to let down                                                             - Bottle a day                   Gall Bladder surgery preparation - post pump after 3 feedings a day                                                             - Morning of surgery - feed  baby and pump for 15 -20 mins to soften breast well and hand express                                                            - Take DEBP to the hospital                                                             - Discussed with mom-  Surgeon probably will recommended pumping and dumping for 24 plus hours                                                            - Encouraged mom to drink plenty fluids , especially H20 .    Follow- up - per mom Dr. Feliciana Rossetti at [place], [place] , and [facility]) on 10 /28                    - call [facility] services with questions.                      Kathrin Greathouse 10/08/2012, 10:28 AM

## 2012-10-10 ENCOUNTER — Ambulatory Visit
Admission: RE | Admit: 2012-10-10 | Discharge: 2012-10-10 | Disposition: A | Discharge status: Home / Self Care | Payer: 59 | Source: Ambulatory Visit | Attending: General Surgery | Admitting: General Surgery

## 2012-10-10 ENCOUNTER — Telehealth (INDEPENDENT_AMBULATORY_CARE_PROVIDER_SITE_OTHER): Payer: Self-pay | Admitting: General Surgery

## 2012-10-10 ENCOUNTER — Other Ambulatory Visit (INDEPENDENT_AMBULATORY_CARE_PROVIDER_SITE_OTHER): Payer: Self-pay | Admitting: General Surgery

## 2012-10-10 IMAGING — RF DG UGI W/ HIGH DENSITY W/KUB
19 of 24 series · 19 of 24 positions shown · non-contrast
Comparison: Abdominal pelvic CT [DATE].

FLUOROSCOPY TIME:  1 min and 54 seconds.

CLINICAL DATA: Frequent regurgitation. History of gastric
laparoscopic banding.

EXAM:
UPPER GI SERIES W/HIGH DENSITY W/KUB
TECHNIQUE: After obtaining a scout radiograph a routine upper GI series was
performed using thin and high density barium.

[Series 1: run · 1 of 1 slices shown (1 of 19)]
[im 1/1]
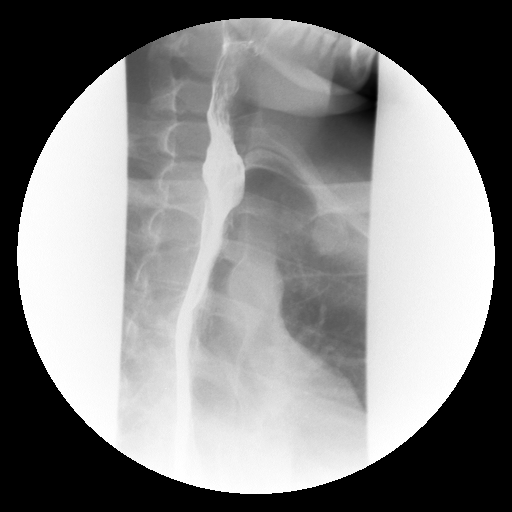

[Series 2: run · 1 of 1 slices shown (2 of 19)]
[im 1/1]
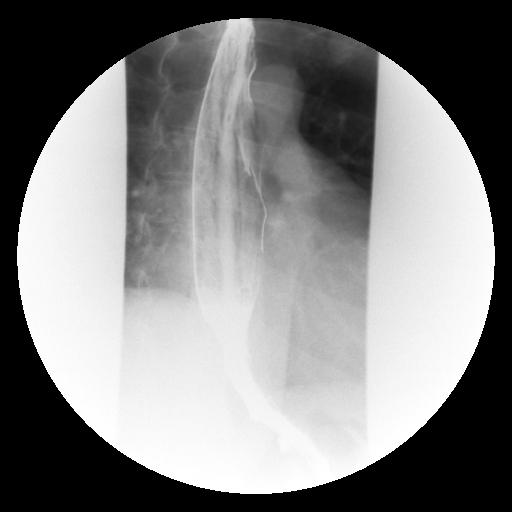

[Series 4: run · 1 of 4 slices shown (3 of 19)]
[im 1/4]
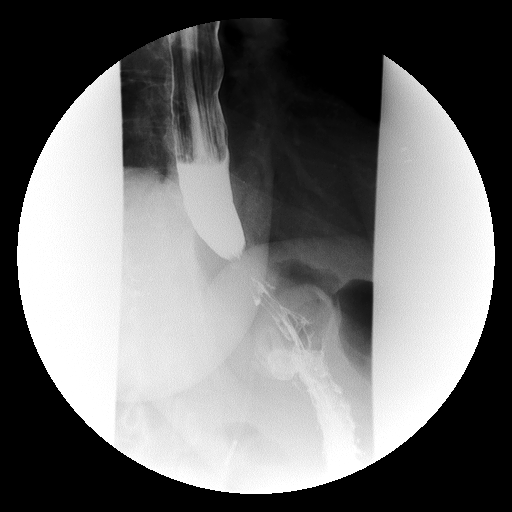

[Series 5: run · 1 of 1 slices shown (4 of 19)]
[im 1/1]
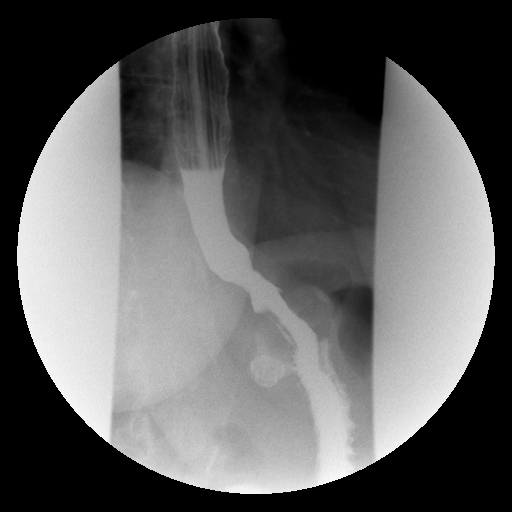

[Series 6: run · 1 of 1 slices shown (5 of 19)]
[im 1/1]
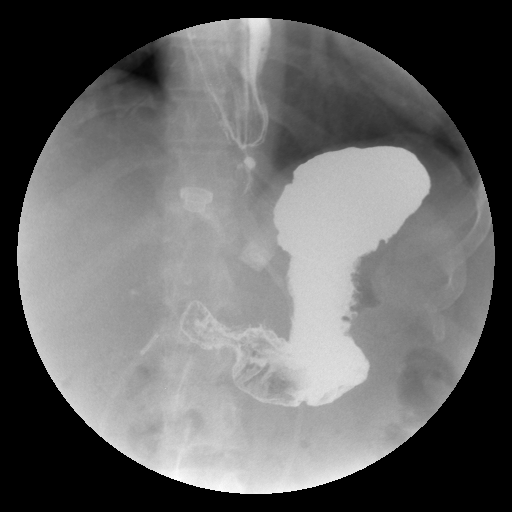

[Series 7: run · 1 of 1 slices shown (6 of 19)]
[im 1/1]
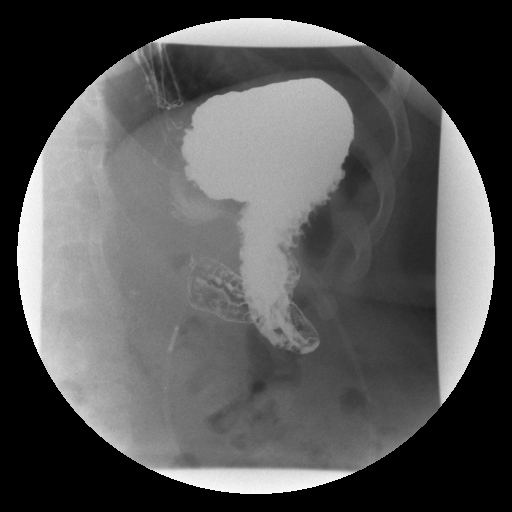

[Series 9: run · 1 of 1 slices shown (7 of 19)]
[im 1/1]
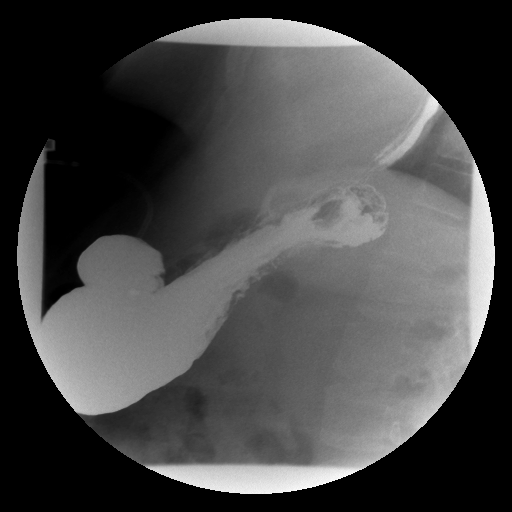

[Series 10: run · 1 of 1 slices shown (8 of 19)]
[im 1/1]
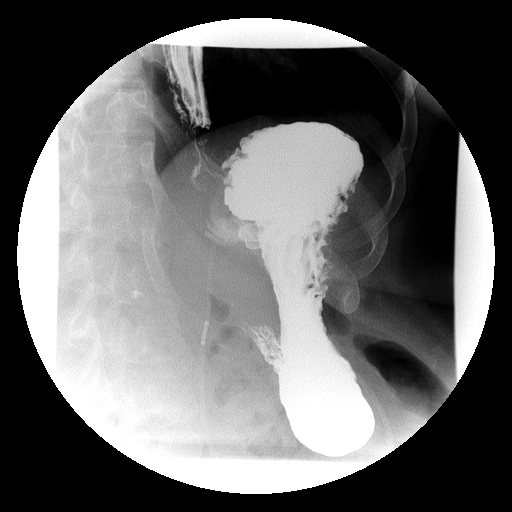

[Series 11: run · 1 of 1 slices shown (9 of 19)]
[im 1/1]
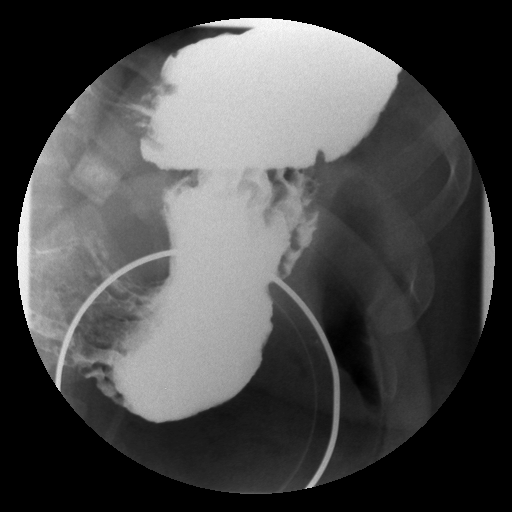

[Series 13: run · 1 of 1 slices shown (10 of 19)]
[im 1/1]
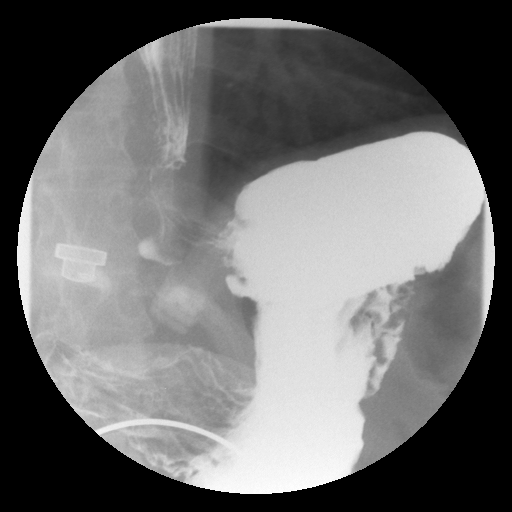

[Series 14: run · 1 of 1 slices shown (11 of 19)]
[im 1/1]
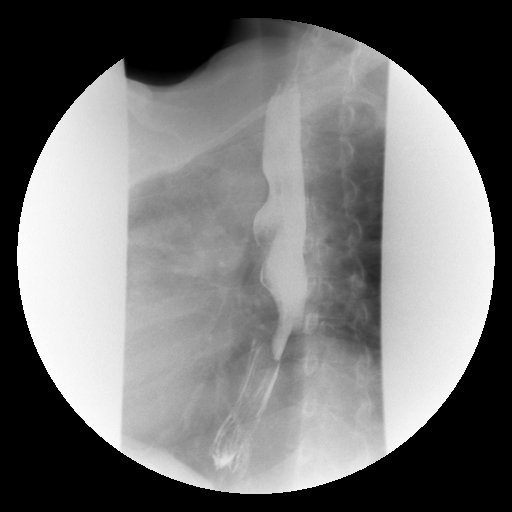

[Series 15: run · 1 of 1 slices shown (12 of 19)]
[im 1/1]
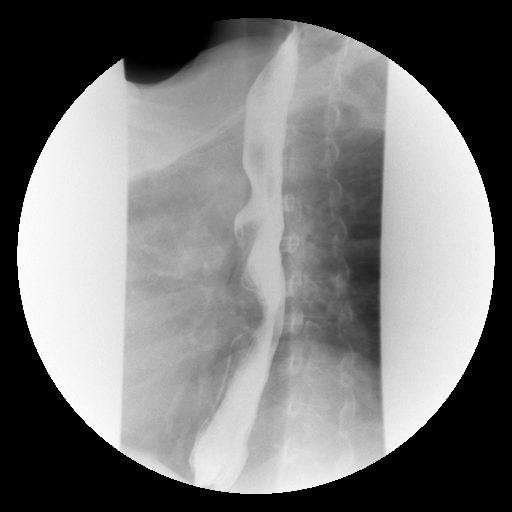

[Series 16: run · 1 of 1 slices shown (13 of 19)]
[im 1/1]
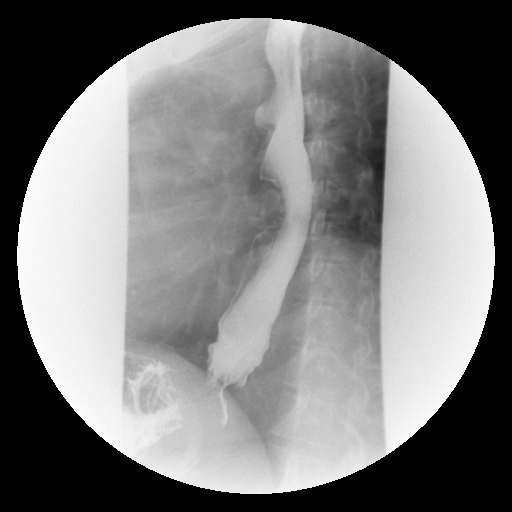

[Series 18: run · 1 of 1 slices shown (14 of 19)]
[im 1/1]
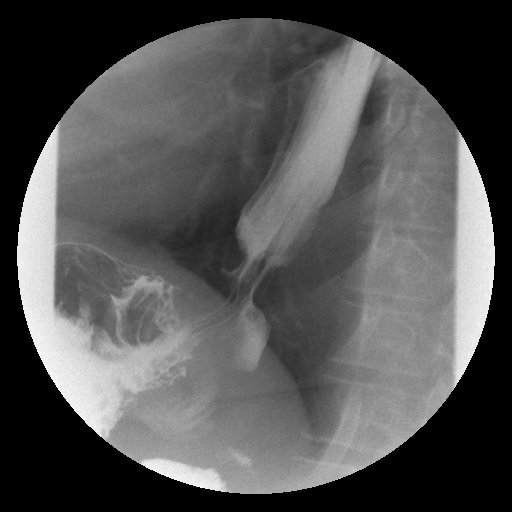

[Series 19: run · 1 of 1 slices shown (15 of 19)]
[im 1/1]
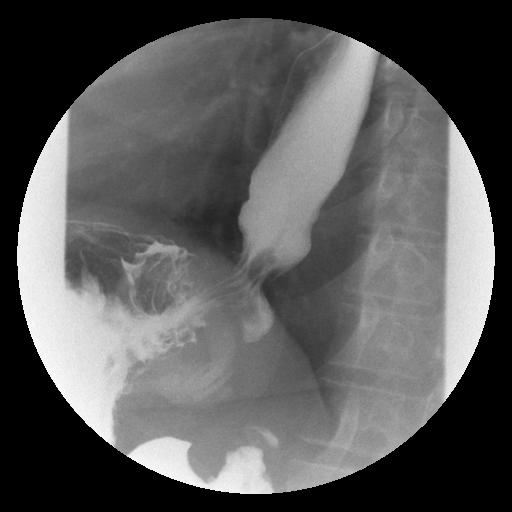

[Series 20: run · 1 of 1 slices shown (16 of 19)]
[im 1/1]
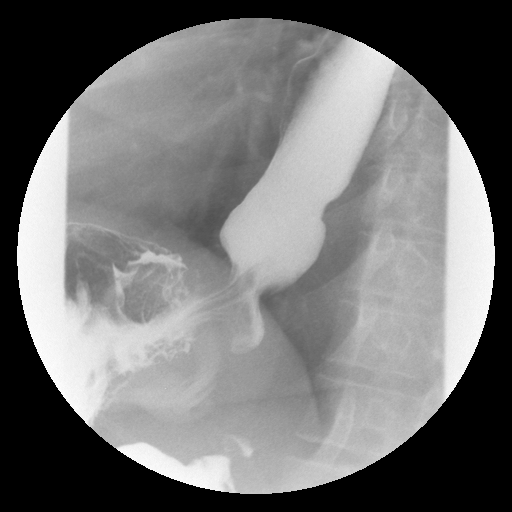

[Series 21: run · 1 of 1 slices shown (17 of 19)]
[im 1/1]
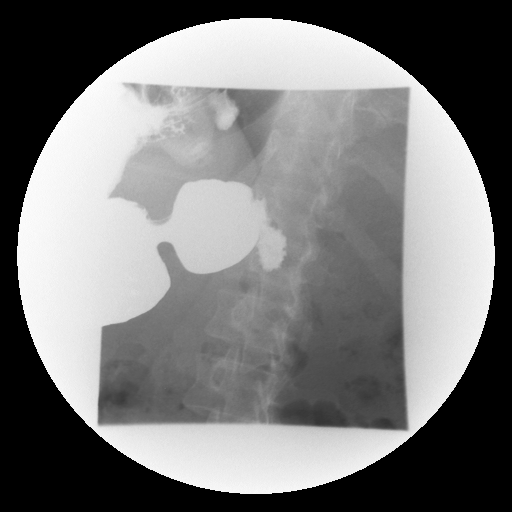

[Series 23: run · 1 of 1 slices shown (18 of 19)]
[im 1/1]
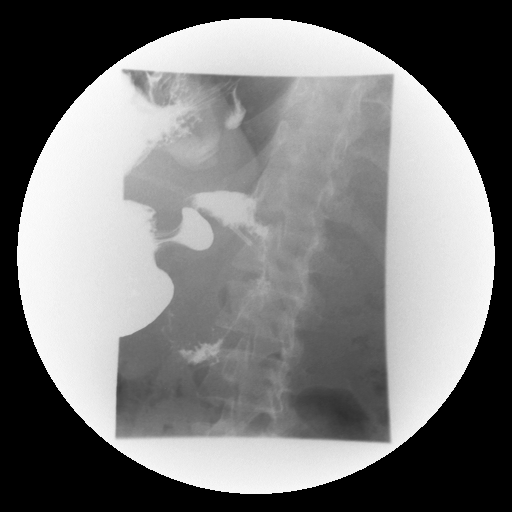

[Series 24: run · 1 of 1 slices shown (19 of 19)]
[im 1/1]
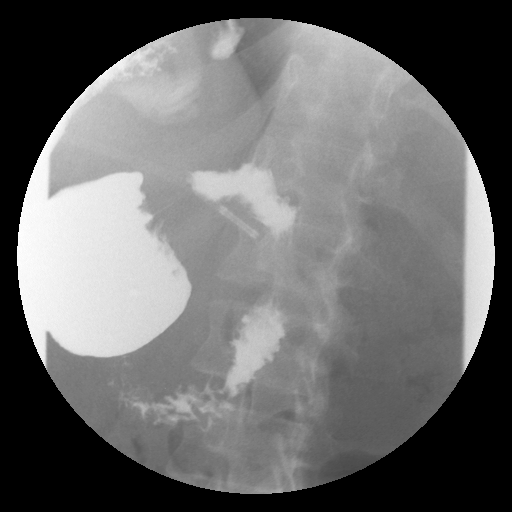

[19 of 24 positions shown; findings below may reference images not displayed]

FINDINGS: The scout abdominal radiograph demonstrates a normal bowel gas
pattern. The gastric laparoscopic band is stable in orientation. Its
tubing is intact.

The esophageal motility is normal. There is no evidence of
stricture, mass or ulceration. There is no significant hiatal
hernia. The laparoscopic band does not restrict the passage of
barium. Only mild gastroesophageal reflux was elicited with the
water siphon test. At the conclusion of the study, a 13 mm barium
tablet was administered, and this passed without delay into the
stomach.

The stomach was incompletely distended. No gastric wall thickening
or focal mucosal abnormality is identified. The duodenum appears
normal without ulceration. There is a small diverticulum involving
the 3rd portion of the duodenum.
IMPRESSION: 1. Mild gastroesophageal reflux. No evidence of esophagitis or
stricture.
2. The gastric laparoscopic band is properly oriented. A barium
tablet passes into the stomach without delay.
3. No significant gastric or proximal small bowel abnormalities.

## 2012-10-10 NOTE — Telephone Encounter (Signed)
Called patient to discuss results of upper GI. There is no significant evidence of a hiatal hernia. The LapBand appears to be in proper orientation. There is no evidence of significant reflux or esophageal dilatation. There is no obstruction. Based on the fact that the upper GI did not reveal a Significant hiatal hernia, I recommended proceeding with laparoscopic cholecystectomy because of her Biliary dyskinesia As well as reposition of her lap band port since it has flipped. The patient agreed with the plan. I told her office will contact her with surgical dates. All of her questions were asked and answered

## 2012-10-11 ENCOUNTER — Telehealth: Payer: Self-pay | Admitting: *Deleted

## 2012-10-11 NOTE — Telephone Encounter (Signed)
Lm informed the pt that her appts for 10/17/12 have been rs to 10/20/12. gv appt for 10/20/12 w/labs@ 2:15pm, ov@ 2:45pm , and inj @ 3:30pm. Made the pt aware that i will mail a letter/cal...td

## 2012-10-17 ENCOUNTER — Other Ambulatory Visit: Payer: 59 | Admitting: Lab

## 2012-10-17 ENCOUNTER — Ambulatory Visit: Payer: 59

## 2012-10-20 ENCOUNTER — Encounter: Payer: Self-pay | Admitting: Hematology and Oncology

## 2012-10-20 ENCOUNTER — Ambulatory Visit (HOSPITAL_BASED_OUTPATIENT_CLINIC_OR_DEPARTMENT_OTHER): Payer: 59 | Admitting: Hematology and Oncology

## 2012-10-20 ENCOUNTER — Other Ambulatory Visit: Payer: Self-pay | Admitting: Hematology and Oncology

## 2012-10-20 ENCOUNTER — Other Ambulatory Visit (HOSPITAL_BASED_OUTPATIENT_CLINIC_OR_DEPARTMENT_OTHER): Payer: 59 | Admitting: Lab

## 2012-10-20 ENCOUNTER — Telehealth: Payer: Self-pay | Admitting: Hematology and Oncology

## 2012-10-20 ENCOUNTER — Ambulatory Visit (HOSPITAL_BASED_OUTPATIENT_CLINIC_OR_DEPARTMENT_OTHER): Payer: 59

## 2012-10-20 ENCOUNTER — Encounter (INDEPENDENT_AMBULATORY_CARE_PROVIDER_SITE_OTHER): Payer: Self-pay | Admitting: General Surgery

## 2012-10-20 VITALS — BP 168/72 | HR 81 | Temp 97.8°F | Resp 18 | Ht 64.0 in | Wt 224.7 lb

## 2012-10-20 DIAGNOSIS — D573 Sickle-cell trait: Secondary | ICD-10-CM

## 2012-10-20 DIAGNOSIS — D509 Iron deficiency anemia, unspecified: Secondary | ICD-10-CM

## 2012-10-20 DIAGNOSIS — E538 Deficiency of other specified B group vitamins: Secondary | ICD-10-CM

## 2012-10-20 HISTORY — DX: Iron deficiency anemia, unspecified: D50.9

## 2012-10-20 LAB — CBC & DIFF AND RETIC
BASO%: 0.5 % (ref 0.0–2.0)
Basophils Absolute: 0 10*3/uL (ref 0.0–0.1)
Eosinophils Absolute: 0.4 10*3/uL (ref 0.0–0.5)
HGB: 11.4 g/dL — ABNORMAL LOW (ref 11.6–15.9)
Immature Retic Fract: 12.2 % — ABNORMAL HIGH (ref 1.60–10.00)
MCV: 70.6 fL — ABNORMAL LOW (ref 79.5–101.0)
MONO%: 6.7 % (ref 0.0–14.0)
NEUT#: 2.5 10*3/uL (ref 1.5–6.5)
Platelets: 303 10*3/uL (ref 145–400)
RDW: 21.5 % — ABNORMAL HIGH (ref 11.2–14.5)
Retic Ct Abs: 68.47 10*3/uL (ref 33.70–90.70)
lymph#: 3.1 10*3/uL (ref 0.9–3.3)

## 2012-10-20 LAB — FERRITIN CHCC: Ferritin: 22 ng/ml (ref 9–269)

## 2012-10-20 MED ORDER — CYANOCOBALAMIN 1000 MCG/ML IJ SOLN
1000.0000 ug | Freq: Once | INTRAMUSCULAR | Status: AC
  Administered 2012-10-20: 1000 ug via INTRAMUSCULAR

## 2012-10-20 MED ORDER — SODIUM CHLORIDE 0.9 % IV SOLN: 200.0000 mg | Freq: Once | INTRAVENOUS | Status: DC

## 2012-10-20 NOTE — Telephone Encounter (Signed)
gv and printed appt sched and avs for pt for Dec °

## 2012-10-20 NOTE — Progress Notes (Signed)
[facility] OFFICE PROGRESS NOTE  Mccarthy,[name], MD  DIAGNOSIS: 1.Sickle cell trait   2. Iron deficiency anemia.  SUMMARY OF HEMATOLOGIC HISTORY: The patient is a 37 y.o. Female who was sent to clinic because of anemia. She has iron deficiency at least since 08/13/2012 (Ferritin 4). Se also had  vitamin B12 deficiency (on 08/14/10 153). She has sickle cell trait. She was recently pregnant and received IV iron infusion. INTERVAL HISTORY: Jamie Mccarthy 37 y.o. female returns for follow-up visit for her anemia. She has received treatment with IV iron. She is currently 3 month post-partum and she is nursing her baby. Denies recent menorhagia or severe bleeding. She was placed on oral iron but that caused excessive bloating.  I have reviewed the past medical history, past surgical history, social history and family history with the patient and they are unchanged from previous note.  ALLERGIES:  is allergic to latex.  MEDICATIONS: Current outpatient prescriptions:Ferrous Fumarate 324 MG TABS, Take 1 tablet (106 mg of iron total) by mouth 2 (two) times daily., Disp: 60 each, Rfl: 1;  metoCLOPramide (REGLAN) 10 MG tablet, Take 1 tablet (10 mg total) by mouth 4 (four) times daily., Disp: 30 tablet, Rfl: 1;  ondansetron (ZOFRAN ODT) 8 MG disintegrating tablet, Take 1 tablet (8 mg total) by mouth every 8 (eight) hours as needed for nausea., Disp: 10 tablet, Rfl: 0 Witch Hazel (PREPARATION H EX), Apply topically as needed., Disp: , Rfl:   REVIEW OF SYSTEMS:   Constitutional: Denies fevers, chills or abnormal weight loss Eyes: Denies blurriness of vision Ears, nose, mouth, throat, and face: Denies mucositis or sore throat Respiratory: Denies cough, dyspnea or wheezes Cardiovascular: Denies palpitation, chest discomfort or lower extremity swelling Gastrointestinal:  Denies nausea, heartburn or change in bowel habits Skin: Denies abnormal skin rashes Lymphatics: Denies new  lymphadenopathy or easy bruising Neurological:Denies numbness, tingling or new weaknesses Behavioral/Psych: Mood is stable, no new changes  All other systems were reviewed with the patient and are negative.  PHYSICAL EXAMINATION: ECOG PERFORMANCE STATUS:1  Filed Vitals:   10/20/12 1450  BP: 168/72  Pulse: 81  Temp: 97.8 F (36.6 C)  Resp: 18   Filed Weights   10/20/12 1450  Weight: 224 lb 11.2 oz (101.923 kg)     GENERAL:alert, no distress and comfortable LABORATORY DATA:  I have reviewed the data as listed Results for orders placed in visit on 10/20/12 (from the past 48 hour(s))  [facility]     Status: None   Collection Time    10/20/12  2:39 PM      Result Value Range   Ferritin 22  9 - 269 ng/ml  CBC & DIFF AND RETIC     Status: Abnormal   Collection Time    10/20/12  2:39 PM      Result Value Range   WBC 6.4  3.9 - 10.3 10e3/uL   NEUT# 2.5  1.5 - 6.5 10e3/uL   HGB 11.4 (*) 11.6 - 15.9 g/dL   HCT 16.1  09.6 - 04.5 %   Platelets 303  145 - 400 10e3/uL   MCV 70.6 (*) 79.5 - 101.0 fL   MCH 22.3 (*) 25.1 - 34.0 pg   MCHC 31.6  31.5 - 36.0 g/dL   RBC 4.09  8.11 - 9.14 10e6/uL   RDW 21.5 (*) 11.2 - 14.5 %   lymph# 3.1  0.9 - 3.3 10e3/uL   MONO# 0.4  0.1 - 0.9 10e3/uL   Eosinophils  Absolute 0.4  0.0 - 0.5 10e3/uL   Basophils Absolute 0.0  0.0 - 0.1 10e3/uL   NEUT% 38.0 (*) 38.4 - 76.8 %   LYMPH% 48.6  14.0 - 49.7 %   MONO% 6.7  0.0 - 14.0 %   EOS% 6.2  0.0 - 7.0 %   BASO% 0.5  0.0 - 2.0 %   Retic % 1.34  0.70 - 2.10 %   Retic Ct Abs 68.47  33.70 - 90.70 10e3/uL   Immature Retic Fract 12.20 (*) 1.60 - 10.00 %     ASSESSMENT: Combined B12 and iron deficiency anemia   PLAN:  1) B12 deficiency 2) Iron deficiency We discussed risks, benefits or side-effects of treatment. We will proceed with B12 injection in the office today. I will schedule IV iron sucrose X 1 for her next week. We will see her back in 3 months with repeat CBC and proceed to give her  additional supplements if needed. I have asked her to reduce her oral iron to once daily.  All questions were answered. The patient knows to call the clinic with any problems, questions or concerns. We can certainly see the patient much sooner if necessary. No barriers to learning was detected.    [facility], Katrina Brosh, MD 10/20/2012 9:35 PM

## 2012-10-21 ENCOUNTER — Telehealth: Payer: Self-pay | Admitting: Hematology and Oncology

## 2012-10-21 ENCOUNTER — Encounter (INDEPENDENT_AMBULATORY_CARE_PROVIDER_SITE_OTHER): Payer: Self-pay | Admitting: General Surgery

## 2012-10-21 ENCOUNTER — Telehealth: Payer: Self-pay | Admitting: *Deleted

## 2012-10-21 LAB — VITAMIN B12: Vitamin B-12: 167 pg/mL — ABNORMAL LOW (ref 211–911)

## 2012-10-21 NOTE — Telephone Encounter (Signed)
Per staff message and POF I have scheduled appts.  JMW  

## 2012-10-21 NOTE — Telephone Encounter (Signed)
s.w. pt and advised on 10.3.14 tx....pt ok and aware

## 2012-10-24 ENCOUNTER — Ambulatory Visit (HOSPITAL_BASED_OUTPATIENT_CLINIC_OR_DEPARTMENT_OTHER): Payer: 59

## 2012-10-24 VITALS — BP 138/82 | HR 82 | Temp 97.9°F | Resp 20

## 2012-10-24 DIAGNOSIS — D509 Iron deficiency anemia, unspecified: Secondary | ICD-10-CM

## 2012-10-24 MED ORDER — SODIUM CHLORIDE 0.9 % IJ SOLN
250.0000 mg | Freq: Once | INTRAVENOUS | Status: AC
  Administered 2012-10-24: 250 mg via INTRAVENOUS
  Filled 2012-10-24: qty 20

## 2012-10-24 MED ORDER — SODIUM CHLORIDE 0.9 % IJ SOLN: 250.0000 mg | Freq: Once | INTRAVENOUS | Status: DC

## 2012-10-24 MED ORDER — SODIUM CHLORIDE 0.9 % IV SOLN: 250.0000 mg | Freq: Once | INTRAVENOUS | Status: DC

## 2012-10-24 MED ORDER — SODIUM CHLORIDE 0.9 % IV SOLN
Freq: Once | INTRAVENOUS | Status: AC
  Administered 2012-10-24: 11:00:00 via INTRAVENOUS

## 2012-10-27 ENCOUNTER — Telehealth (INDEPENDENT_AMBULATORY_CARE_PROVIDER_SITE_OTHER): Payer: Self-pay | Admitting: General Surgery

## 2012-10-27 NOTE — Telephone Encounter (Signed)
Message copied by June Leap on Mon Oct 27, 2012  3:16 PM ------      Message from: Andrey Campanile, ERIC M      Created: Mon Oct 27, 2012  1:07 PM      Regarding: FW: LB FILL      Contact: [phone]       She can see me or Mardelle Matte for a fill.             Thanks      Andrey Campanile      ----- Message -----         From: Larry Sierras         Sent: 10/27/2012  12:46 PM           To: Atilano Ina, MD, June Leap      Subject: LB FILL                                                  PT REQ IF CAN HAVE FLUID PUT BACK IN HER LB, DUE TO SX NOT UNTIL 12-01-12. GM       ------

## 2012-10-27 NOTE — Telephone Encounter (Signed)
Made pt an appt to come in this Thursday to see Jamie Mccarthy for LB fill @ 8:30 10/9.Marland KitchenMarland Kitchenpt verbalized agreement and is very appreciative

## 2012-10-27 NOTE — Telephone Encounter (Signed)
Pt wants to be scheduled for surgery asap / do you need an asst? Bariatric asst? If can be done with out assist can do 11/14/12

## 2012-10-28 NOTE — Telephone Encounter (Signed)
i don't need a bariatric assist. But would prefer an assist, oct 24 is fine with me

## 2012-10-30 ENCOUNTER — Encounter (INDEPENDENT_AMBULATORY_CARE_PROVIDER_SITE_OTHER): Payer: Self-pay

## 2012-10-30 ENCOUNTER — Ambulatory Visit (INDEPENDENT_AMBULATORY_CARE_PROVIDER_SITE_OTHER): Payer: 59 | Admitting: Physician Assistant

## 2012-10-30 VITALS — BP 152/92 | HR 76 | Resp 14 | Ht 64.0 in | Wt 225.2 lb

## 2012-10-30 DIAGNOSIS — Z4651 Encounter for fitting and adjustment of gastric lap band: Secondary | ICD-10-CM

## 2012-10-30 NOTE — Progress Notes (Signed)
  HISTORY: Jamie Mccarthy is a 37 y.o.female who received a lap-band in 2005 at [facility]. She comes in having seen Dr. Andrey Campanile for RUQ pain and port pain. At her last visit, she had all the fluid removed, which was just over 9 mL. She says that after her initial operation, she had 2 port revision procedures done within a year or so. She complains of her port continuing to be tender, which began after she delivered her twins. She comes in today with complaints of significant hunger and weight gain as her band is basically empty. She would like a fill.  VITAL SIGNS: Filed Vitals:   10/30/12 0847  BP: 152/92  Pulse: 76  Resp: 14    PHYSICAL EXAM: Physical exam reveals a very well-appearing 37 y.o.female in no apparent distress Neurologic: Awake, alert, oriented Psych: Bright affect, conversant Respiratory: Breathing even and unlabored. No stridor or wheezing Abdomen: Soft, nondistended to palpation. Incisions well-healed. No incisional hernias. Port easily palpated, though there is significant cutaneous tenderness just above the port but no erythema, induration or fluctuance. The port tilts cephalad. Extremities: Atraumatic, good range of motion.  ASSESMENT: 37 y.o.  female  s/p lap-band.   PLAN: The patient's port was accessed with a 20G Huber needle without difficulty after stabilization. No clear fluid was aspirated and 4 mL saline was added to the port to give a total predicted volume of 4 mL. As much air as possible was aspirated from the port. The patient was able to swallow water without difficulty following the procedure and was instructed to take clear liquids for the next 24-48 hours and advance slowly as tolerated. She insists that she's had no further surgeries since the port procedures in 2005-06. There is no documentation explaining what type of band she received, but given the year of surgery and volume removed, I can only ascertain that she has a VG band. X-rays of  the port would be unreliable for identification as there's no documentation as to what was done during port revision (repositioning vs. Replacement). Regardless, she's scheduled for port revision/repositioning and lap chole by Dr. Andrey Campanile in 2 weeks. I don't expect her to need another fill prior to this.

## 2012-10-30 NOTE — Patient Instructions (Signed)

## 2012-11-06 ENCOUNTER — Encounter (HOSPITAL_COMMUNITY)
Admission: RE | Admit: 2012-11-06 | Discharge: 2012-11-06 | Disposition: A | Discharge status: Home / Self Care | Payer: 59 | Source: Ambulatory Visit | Attending: General Surgery | Admitting: General Surgery

## 2012-11-06 ENCOUNTER — Encounter (HOSPITAL_COMMUNITY): Payer: Self-pay | Admitting: Pharmacy Technician

## 2012-11-06 ENCOUNTER — Ambulatory Visit (HOSPITAL_COMMUNITY)
Admission: RE | Admit: 2012-11-06 | Discharge: 2012-11-06 | Disposition: A | Discharge status: Home / Self Care | Payer: 59 | Source: Ambulatory Visit | Attending: General Surgery | Admitting: General Surgery

## 2012-11-06 ENCOUNTER — Encounter (HOSPITAL_COMMUNITY): Payer: Self-pay

## 2012-11-06 DIAGNOSIS — Z01818 Encounter for other preprocedural examination: Secondary | ICD-10-CM | POA: Insufficient documentation

## 2012-11-06 DIAGNOSIS — R9431 Abnormal electrocardiogram [ECG] [EKG]: Secondary | ICD-10-CM | POA: Insufficient documentation

## 2012-11-06 DIAGNOSIS — Z01812 Encounter for preprocedural laboratory examination: Secondary | ICD-10-CM | POA: Insufficient documentation

## 2012-11-06 DIAGNOSIS — I517 Cardiomegaly: Secondary | ICD-10-CM | POA: Insufficient documentation

## 2012-11-06 HISTORY — DX: Herpesviral infection, unspecified: B00.9

## 2012-11-06 HISTORY — DX: Reserved for inherently not codable concepts without codable children: IMO0001

## 2012-11-06 HISTORY — DX: Headache: R51

## 2012-11-06 HISTORY — DX: Other seasonal allergic rhinitis: J30.2

## 2012-11-06 HISTORY — DX: Essential (primary) hypertension: I10

## 2012-11-06 LAB — COMPREHENSIVE METABOLIC PANEL
ALT: 11 U/L (ref 0–35)
BUN: 12 mg/dL (ref 6–23)
CO2: 29 mEq/L (ref 19–32)
Calcium: 9.6 mg/dL (ref 8.4–10.5)
Chloride: 103 mEq/L (ref 96–112)
Creatinine, Ser: 0.67 mg/dL (ref 0.50–1.10)
GFR calc Af Amer: >90 mL/min (ref >90)
GFR calc non Af Amer: >90 mL/min (ref >90)
Glucose, Bld: 91 mg/dL (ref 70–99)
Sodium: 139 mEq/L (ref 135–145)
Total Bilirubin: 0.2 mg/dL — ABNORMAL LOW (ref 0.3–1.2)
Total Protein: 7.5 g/dL (ref 6.0–8.3)

## 2012-11-06 LAB — CBC WITH DIFFERENTIAL/PLATELET
Eosinophils Relative: 4 % (ref 0–5)
HCT: 37.5 % (ref 36.0–46.0)
Lymphocytes Relative: 53 % — ABNORMAL HIGH (ref 12–46)
Lymphs Abs: 3.4 10*3/uL (ref 0.7–4.0)
MCV: 72.7 fL — ABNORMAL LOW (ref 78.0–100.0)
Monocytes Absolute: 0.4 10*3/uL (ref 0.1–1.0)
RBC: 5.16 MIL/uL — ABNORMAL HIGH (ref 3.87–5.11)
WBC: 6.4 10*3/uL (ref 4.0–10.5)

## 2012-11-06 IMAGING — CR DG CHEST 2V
2 series · 2 of 2 positions shown · non-contrast
Comparison: [DATE]

CLINICAL DATA: Preoperative evaluation for laparoscopic
cholecystectomy

EXAM:
CHEST  2 VIEW

[w chest pa]
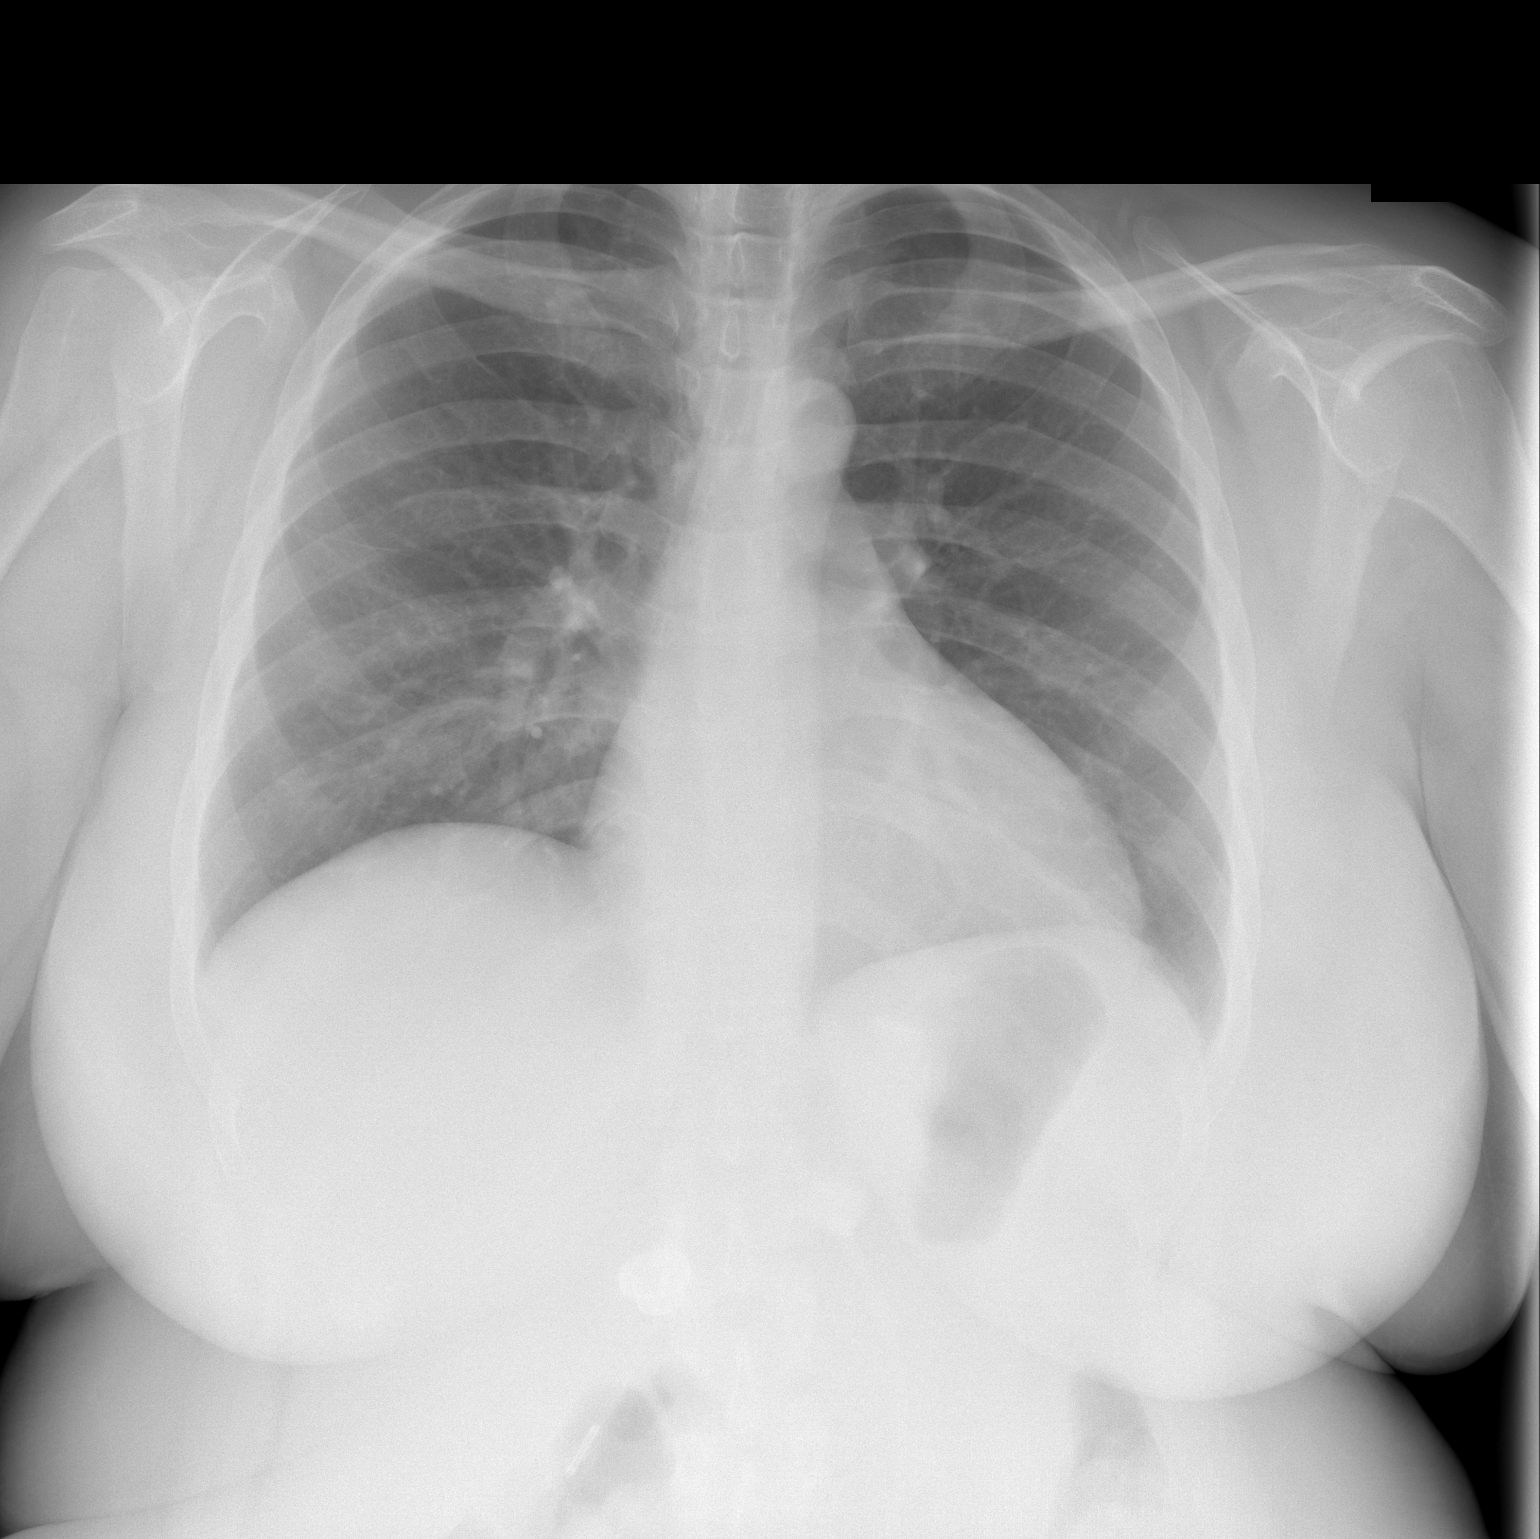

[w chest lat]
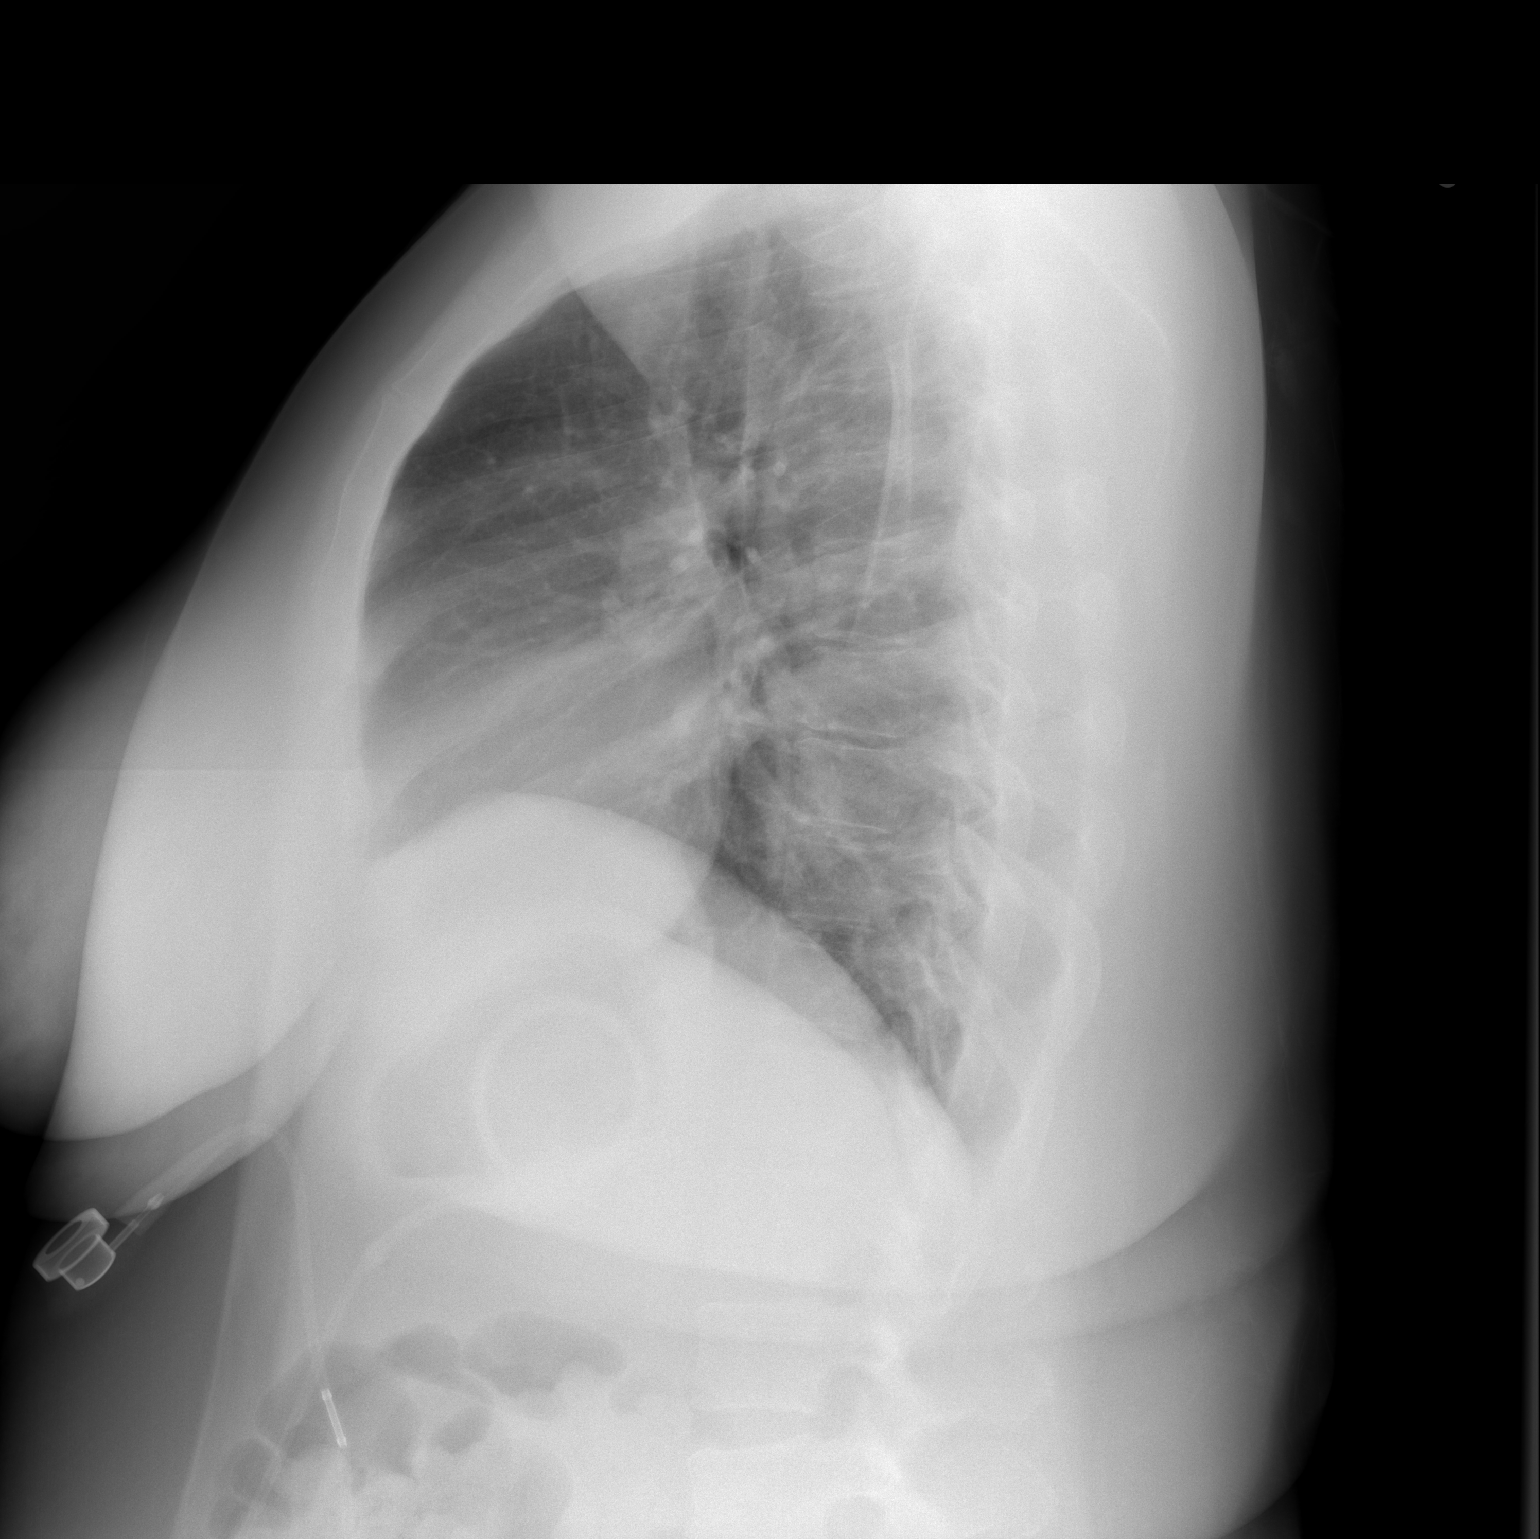

[2 of 2 positions shown; findings below may reference images not displayed]

FINDINGS: Upper-normal size of cardiac silhouette.

Mediastinal contours and pulmonary vascularity normal.

Lungs clear.

No pleural effusion or pneumothorax.

No acute osseous findings.

Prior laparoscopic gastric banding.
IMPRESSION: No acute abnormalities.

## 2012-11-06 NOTE — Patient Instructions (Signed)
20 M[address] A Kahl  11/06/2012   Your procedure is scheduled on: 11/14/12  Report to [facility] at 7:15 AM.  Call this number if you have problems the morning of surgery [phone]: [phone]   Remember:   Do not eat food or drink liquids After Midnight.   Do not wear jewelry, make-up or nail polish.  Do not wear lotions, powders, or perfumes. You may wear deodorant.  Do not shave 48 hours prior to surgery. Men may shave face and neck.  Do not bring valuables to the hospital.  Contacts, dentures or bridgework may not be worn into surgery.  Leave suitcase in the car. After surgery it may be brought to your room.  For patients admitted to the hospital, checkout time is 11:00 AM the day of discharge.   Birdie Sons, RN  pre op nurse call if needed [phone]    FAILURE TO FOLLOW THESE INSTRUCTIONS MAY RESULT IN CANCELLATION OF YOUR SURGERY   Patient Signature: ___________________________________________

## 2012-11-14 ENCOUNTER — Ambulatory Visit (HOSPITAL_COMMUNITY): Payer: 59 | Admitting: Registered Nurse

## 2012-11-14 ENCOUNTER — Encounter (HOSPITAL_COMMUNITY)
Admission: RE | Disposition: A | Discharge status: Home / Self Care | Payer: Self-pay | Source: Ambulatory Visit | Attending: General Surgery

## 2012-11-14 ENCOUNTER — Encounter (HOSPITAL_COMMUNITY): Payer: 59 | Admitting: Registered Nurse

## 2012-11-14 ENCOUNTER — Encounter (HOSPITAL_COMMUNITY): Payer: Self-pay | Admitting: *Deleted

## 2012-11-14 ENCOUNTER — Ambulatory Visit (HOSPITAL_COMMUNITY)
Admission: RE | Admit: 2012-11-14 | Discharge: 2012-11-14 | Disposition: A | Discharge status: Home / Self Care | Payer: 59 | Source: Ambulatory Visit | Attending: General Surgery | Admitting: General Surgery

## 2012-11-14 DIAGNOSIS — K8010 Calculus of gallbladder with chronic cholecystitis without obstruction: Secondary | ICD-10-CM | POA: Insufficient documentation

## 2012-11-14 DIAGNOSIS — K824 Cholesterolosis of gallbladder: Secondary | ICD-10-CM

## 2012-11-14 DIAGNOSIS — I10 Essential (primary) hypertension: Secondary | ICD-10-CM | POA: Insufficient documentation

## 2012-11-14 DIAGNOSIS — K811 Chronic cholecystitis: Secondary | ICD-10-CM

## 2012-11-14 DIAGNOSIS — T85698A Other mechanical complication of other specified internal prosthetic devices, implants and grafts, initial encounter: Secondary | ICD-10-CM | POA: Insufficient documentation

## 2012-11-14 DIAGNOSIS — D573 Sickle-cell trait: Secondary | ICD-10-CM | POA: Insufficient documentation

## 2012-11-14 DIAGNOSIS — T85898A Other specified complication of other internal prosthetic devices, implants and grafts, initial encounter: Secondary | ICD-10-CM

## 2012-11-14 DIAGNOSIS — D509 Iron deficiency anemia, unspecified: Secondary | ICD-10-CM | POA: Insufficient documentation

## 2012-11-14 DIAGNOSIS — K219 Gastro-esophageal reflux disease without esophagitis: Secondary | ICD-10-CM | POA: Insufficient documentation

## 2012-11-14 DIAGNOSIS — K439 Ventral hernia without obstruction or gangrene: Secondary | ICD-10-CM | POA: Insufficient documentation

## 2012-11-14 DIAGNOSIS — Y831 Surgical operation with implant of artificial internal device as the cause of abnormal reaction of the patient, or of later complication, without mention of misadventure at the time of the procedure: Secondary | ICD-10-CM | POA: Insufficient documentation

## 2012-11-14 DIAGNOSIS — K9509 Other complications of gastric band procedure: Secondary | ICD-10-CM | POA: Insufficient documentation

## 2012-11-14 HISTORY — PX: CHOLECYSTECTOMY: SHX55

## 2012-11-14 HISTORY — PX: GASTRIC BANDING PORT REVISION: SHX5246

## 2012-11-14 SURGERY — GASTRIC BANDING PORT REVISION
Anesthesia: General | Site: Abdomen | Wound class: Clean

## 2012-11-14 MED ORDER — LACTATED RINGERS IV SOLN
INTRAVENOUS | Status: DC | PRN
  Administered 2012-11-14: 2000 mL

## 2012-11-14 MED ORDER — BUPIVACAINE-EPINEPHRINE 0.25% -1:200000 IJ SOLN
INTRAMUSCULAR | Status: DC | PRN
  Administered 2012-11-14: 30 mL

## 2012-11-14 MED ORDER — ROCURONIUM BROMIDE 100 MG/10ML IV SOLN
INTRAVENOUS | Status: DC | PRN
  Administered 2012-11-14: 60 mg via INTRAVENOUS

## 2012-11-14 MED ORDER — ONDANSETRON HCL 4 MG/2ML IJ SOLN: 4.0000 mg | Freq: Four times a day (QID) | INTRAMUSCULAR | Status: DC | PRN

## 2012-11-14 MED ORDER — PROMETHAZINE HCL 25 MG/ML IJ SOLN
12.5000 mg | INTRAMUSCULAR | Status: DC | PRN
Start: 2012-11-14 — End: 2012-11-14
  Administered 2012-11-14: 12.5 mg via INTRAVENOUS

## 2012-11-14 MED ORDER — DEXTROSE 5 % IV SOLN
2.0000 g | INTRAVENOUS | Status: AC
  Administered 2012-11-14: 2 g via INTRAVENOUS
  Filled 2012-11-14: qty 2

## 2012-11-14 MED ORDER — MORPHINE SULFATE 10 MG/ML IJ SOLN: 1.0000 mg | INTRAMUSCULAR | Status: DC | PRN

## 2012-11-14 MED ORDER — DEXTROSE 5 % IV SOLN
INTRAVENOUS | Status: AC
  Filled 2012-11-14 (×2): qty 1

## 2012-11-14 MED ORDER — SODIUM CHLORIDE 0.9 % IJ SOLN
INTRAMUSCULAR | Status: AC
  Filled 2012-11-14: qty 10

## 2012-11-14 MED ORDER — SODIUM CHLORIDE 0.9 % IJ SOLN: 3.0000 mL | Freq: Two times a day (BID) | INTRAMUSCULAR | Status: DC

## 2012-11-14 MED ORDER — ACETAMINOPHEN 325 MG PO TABS: 650.0000 mg | ORAL_TABLET | ORAL | Status: DC | PRN

## 2012-11-14 MED ORDER — NEOSTIGMINE METHYLSULFATE 1 MG/ML IJ SOLN
INTRAMUSCULAR | Status: DC | PRN
  Administered 2012-11-14: 5 mg via INTRAVENOUS

## 2012-11-14 MED ORDER — FENTANYL CITRATE 0.05 MG/ML IJ SOLN
INTRAMUSCULAR | Status: DC | PRN
  Administered 2012-11-14: 100 ug via INTRAVENOUS
  Administered 2012-11-14 (×3): 50 ug via INTRAVENOUS
  Administered 2012-11-14: 100 ug via INTRAVENOUS

## 2012-11-14 MED ORDER — HYDROMORPHONE HCL PF 1 MG/ML IJ SOLN
INTRAMUSCULAR | Status: AC
  Filled 2012-11-14: qty 1

## 2012-11-14 MED ORDER — POTASSIUM CHLORIDE IN NACL 20-0.9 MEQ/L-% IV SOLN: INTRAVENOUS | Status: DC

## 2012-11-14 MED ORDER — HYDROMORPHONE HCL PF 1 MG/ML IJ SOLN
0.2500 mg | INTRAMUSCULAR | Status: DC | PRN
  Administered 2012-11-14 (×3): 0.5 mg via INTRAVENOUS

## 2012-11-14 MED ORDER — LACTATED RINGERS IV SOLN
INTRAVENOUS | Status: DC
  Administered 2012-11-14: 1000 mL via INTRAVENOUS

## 2012-11-14 MED ORDER — SODIUM CHLORIDE 0.9 % IJ SOLN
INTRAMUSCULAR | Status: DC | PRN
  Administered 2012-11-14: 20 mL

## 2012-11-14 MED ORDER — PROMETHAZINE HCL 25 MG/ML IJ SOLN
INTRAMUSCULAR | Status: AC
  Filled 2012-11-14: qty 1

## 2012-11-14 MED ORDER — LACTATED RINGERS IV SOLN: INTRAVENOUS | Status: DC

## 2012-11-14 MED ORDER — PROPOFOL 10 MG/ML IV BOLUS
INTRAVENOUS | Status: DC | PRN
  Administered 2012-11-14: 200 mg via INTRAVENOUS

## 2012-11-14 MED ORDER — SODIUM CHLORIDE 0.9 % IJ SOLN
INTRAMUSCULAR | Status: AC
  Filled 2012-11-14: qty 20

## 2012-11-14 MED ORDER — BUPIVACAINE-EPINEPHRINE 0.25% -1:200000 IJ SOLN
INTRAMUSCULAR | Status: AC
  Filled 2012-11-14: qty 1

## 2012-11-14 MED ORDER — OXYCODONE HCL 5 MG PO TABS
5.0000 mg | ORAL_TABLET | ORAL | Status: DC | PRN
  Administered 2012-11-14: 5 mg via ORAL
  Filled 2012-11-14: qty 1

## 2012-11-14 MED ORDER — MIDAZOLAM HCL 5 MG/5ML IJ SOLN
INTRAMUSCULAR | Status: DC | PRN
  Administered 2012-11-14: 2 mg via INTRAVENOUS

## 2012-11-14 MED ORDER — ACETAMINOPHEN 650 MG RE SUPP
650.0000 mg | RECTAL | Status: DC | PRN
  Filled 2012-11-14: qty 1

## 2012-11-14 MED ORDER — LIDOCAINE HCL (CARDIAC) 20 MG/ML IV SOLN
INTRAVENOUS | Status: DC | PRN
  Administered 2012-11-14: 80 mg via INTRAVENOUS

## 2012-11-14 MED ORDER — GLYCOPYRROLATE 0.2 MG/ML IJ SOLN
INTRAMUSCULAR | Status: DC | PRN
  Administered 2012-11-14: .8 mg via INTRAVENOUS

## 2012-11-14 MED ORDER — ONDANSETRON HCL 4 MG/2ML IJ SOLN
INTRAMUSCULAR | Status: DC | PRN
  Administered 2012-11-14: 4 mg via INTRAVENOUS

## 2012-11-14 MED ORDER — DEXAMETHASONE SODIUM PHOSPHATE 10 MG/ML IJ SOLN
INTRAMUSCULAR | Status: DC | PRN
  Administered 2012-11-14: 10 mg via INTRAVENOUS

## 2012-11-14 MED ORDER — OXYCODONE-ACETAMINOPHEN 5-325 MG PO TABS: 1.0000 | ORAL_TABLET | ORAL | Status: DC | PRN

## 2012-11-14 MED ORDER — SODIUM CHLORIDE 0.9 % IJ SOLN: 3.0000 mL | INTRAMUSCULAR | Status: DC | PRN

## 2012-11-14 MED ORDER — SODIUM CHLORIDE 0.9 % IV SOLN: 250.0000 mL | INTRAVENOUS | Status: DC | PRN

## 2012-11-14 SURGICAL SUPPLY — 52 items
APPLIER CLIP 5 13 M/L LIGAMAX5 (MISCELLANEOUS) ×2
APPLIER CLIP ROT 10 11.4 M/L (STAPLE)
BANDAGE ADHESIVE 1X3 (GAUZE/BANDAGES/DRESSINGS) IMPLANT
BENZOIN TINCTURE PRP APPL 2/3 (GAUZE/BANDAGES/DRESSINGS) IMPLANT
BLADE HEX COATED 2.75 (ELECTRODE) ×2 IMPLANT
CANISTER SUCTION 2500CC (MISCELLANEOUS) ×2 IMPLANT
CHLORAPREP W/TINT 26ML (MISCELLANEOUS) ×2 IMPLANT
CLIP APPLIE 5 13 M/L LIGAMAX5 (MISCELLANEOUS) ×1 IMPLANT
CLIP APPLIE ROT 10 11.4 M/L (STAPLE) IMPLANT
CLOTH BEACON ORANGE TIMEOUT ST (SAFETY) ×2 IMPLANT
COVER MAYO STAND STRL (DRAPES) ×2 IMPLANT
COVER SURGICAL LIGHT HANDLE (MISCELLANEOUS) ×2 IMPLANT
DECANTER SPIKE VIAL GLASS SM (MISCELLANEOUS) ×4 IMPLANT
DERMABOND ADVANCED (GAUZE/BANDAGES/DRESSINGS) ×2
DERMABOND ADVANCED .7 DNX12 (GAUZE/BANDAGES/DRESSINGS) ×2 IMPLANT
DRAPE C-ARM 42X120 X-RAY (DRAPES) IMPLANT
DRAPE LAPAROSCOPIC ABDOMINAL (DRAPES) ×2 IMPLANT
DRAPE UTILITY XL STRL (DRAPES) ×4 IMPLANT
DRSG TEGADERM 2-3/8X2-3/4 SM (GAUZE/BANDAGES/DRESSINGS) IMPLANT
ELECT REM PT RETURN 9FT ADLT (ELECTROSURGICAL) ×2
ELECTRODE REM PT RTRN 9FT ADLT (ELECTROSURGICAL) ×1 IMPLANT
GLOVE BIO SURGEON STRL SZ7.5 (GLOVE) ×2 IMPLANT
GLOVE BIOGEL M STRL SZ7.5 (GLOVE) IMPLANT
GLOVE BIOGEL PI IND STRL 7.0 (GLOVE) ×1 IMPLANT
GLOVE BIOGEL PI INDICATOR 7.0 (GLOVE) ×1
GLOVE INDICATOR 8.0 STRL GRN (GLOVE) ×2 IMPLANT
GOWN PREVENTION PLUS LG XLONG (DISPOSABLE) ×2 IMPLANT
GOWN STRL REIN XL XLG (GOWN DISPOSABLE) ×4 IMPLANT
HEMOSTAT SNOW SURGICEL 2X4 (HEMOSTASIS) ×2 IMPLANT
KIT BASIN OR (CUSTOM PROCEDURE TRAY) ×2 IMPLANT
MESH HERNIA 1X4 RECT BARD (Mesh General) ×1 IMPLANT
MESH HERNIA BARD 1X4 (Mesh General) ×1 IMPLANT
NEEDLE HYPO 22GX1.5 SAFETY (NEEDLE) ×2 IMPLANT
NS IRRIG 1000ML POUR BTL (IV SOLUTION) ×2 IMPLANT
PACK GENERAL/GYN (CUSTOM PROCEDURE TRAY) ×2 IMPLANT
POUCH SPECIMEN RETRIEVAL 10MM (ENDOMECHANICALS) ×2 IMPLANT
SET CHOLANGIOGRAPH MIX (MISCELLANEOUS) IMPLANT
SET IRRIG TUBING LAPAROSCOPIC (IRRIGATION / IRRIGATOR) ×2 IMPLANT
SOLUTION ANTI FOG 6CC (MISCELLANEOUS) ×2 IMPLANT
STAPLER VISISTAT 35W (STAPLE) IMPLANT
STRIP CLOSURE SKIN 1/2X4 (GAUZE/BANDAGES/DRESSINGS) IMPLANT
SUT MNCRL AB 4-0 PS2 18 (SUTURE) ×6 IMPLANT
SUT PROLENE 2 0 CT2 30 (SUTURE) ×2 IMPLANT
SUT VIC AB 2-0 SH 27 (SUTURE) ×1
SUT VIC AB 2-0 SH 27X BRD (SUTURE) ×1 IMPLANT
SYR CONTROL 10ML LL (SYRINGE) ×2 IMPLANT
SYRINGE 10CC LL (SYRINGE) IMPLANT
TOWEL OR 17X26 10 PK STRL BLUE (TOWEL DISPOSABLE) ×2 IMPLANT
TRAY LAP CHOLE (CUSTOM PROCEDURE TRAY) ×2 IMPLANT
TROCAR BLADELESS OPT 5 75 (ENDOMECHANICALS) ×6 IMPLANT
TROCAR XCEL BLUNT TIP 100MML (ENDOMECHANICALS) ×2 IMPLANT
TUBING INSUFFLATION 10FT LAP (TUBING) ×2 IMPLANT

## 2012-11-14 NOTE — Op Note (Signed)
Jamie Mccarthy, Jamie Mccarthy               ACCOUNT NO.:  [id number]  MEDICAL RECORD NO.:  [id number]  LOCATION:  [facility]                         FACILITY:  [facility]  PHYSICIAN:  Mary Sella. Andrey Campanile, MD, FACSDATE OF BIRTH:  May 03, 1975  DATE OF PROCEDURE:  11/14/2012 DATE OF DISCHARGE:                              OPERATIVE REPORT   PREOPERATIVE DIAGNOSES: 1. Biliary dyskinesia. 2. Flipped lap band port.  POSTOPERATIVE DIAGNOSES: 1. Biliary dyskinesia. 2. Flipped lap band port. 3. Lower abdominal ventral hernia  PROCEDURE: 1. Laparoscopic cholecystectomy. 2. Lap band port revision.  SURGEON:  Mary Sella. Andrey Campanile, MD, FACS  ASSISTANT SURGEON:  Romie Levee, MD  ANESTHESIA:  General plus local consisting 0.25% Marcaine.  SPECIMEN:  Gallbladder.  INDICATIONS FOR PROCEDURE:  The patient is a 37 year old African American female who has had intermittent episodes of right upper quadrant pain, it is always postprandial and radiates to her back.  She had no evidence of gallstones on ultrasound but her HIDA scan demonstrated a gallbladder ejection fraction of 4%.  This was consistent with biliary dyskinesia.  The patient had also had a lap band placed at [facility] in 2005.  A port was placed in her subxiphoid position.  She had several problems with her port after surgery.  It kept flipping.  Her port has been revised on 2 subsequent occasions.  Her port is currently flipped and protruding and causing her tenderness and discomfort.  I recommended revision of her port to her right mid abdomen.  We discussed at length the risks and benefits of surgery including, but not limited to, bleeding, infection, injury to surrounding structures, need to convert to an open procedure, bile leak, prolonged diarrhea, injury to common bile duct, blood clot formation, anesthesia complications, retained gallstones, port complications such as slippage, infection and injury to the tubing.  She elected  to proceed with surgery.  DESCRIPTION OF PROCEDURE:  After obtaining informed consent, the patient was taken to the operating room 1 at [facility] where she was placed supine on the operating table.  General endotracheal anesthesia was established.  Sequential compression devices were placed. Her port was palpable in her subxiphoid position slightly to the right of her subxiphoid incision.  Her abdomen was prepped and draped in the usual standard surgical fashion.  She received IV antibiotics prior to skin incision.  I elected to gain entry to her abdominal cavity in the left subxiphoid position.  A small incision was made 2 fingerbreadths below the left subxiphoid margin with a #11 blade.  Then using a 0 degree 5 mm laparoscope, I advanced it through all layers of the abdominal wall and carefully entered the abdominal cavity. Pneumoperitoneum was smoothly established up to a patient pressure of 15 mmHg.  Laparoscope was advanced.  The abdominal cavity was surveilled. The band tubing was visible and appeared to be going up through the falciform ligament to her subxiphoid location.  A 5-mm trocar was placed in the supraumbilical position and two 5-mm trocars were placed in the right mid abdomen all under direct visualization.  I up sized the 5-mm trocar in the left subcostal position to a 11-mm trocar.  The gallbladder  was grasped and retracted to the right shoulder.  The infundibulum was grasped and retracted laterally.  Hook electrocautery was used to incise the peritoneum overlying the gallbladder both medially and laterally.  The cystic duct and cystic artery were circumferentially dissected out around.  There was a critical view.  Two clips were placed on the biliary aspect of the cystic duct and one on the gallbladder as it entered the gallbladder.  We then transected with EndoShears.  Two clips were placed on the downside of the cystic artery on the anterior  branch and one as it entered the gallbladder.  There was a small posterior branch which was clipped as well and covered with EndoShears.  The gallbladder was then mobilized up at the gallbladder fossa.  It was intrahepatic.  There was really no plane between the gallbladder and the liver capsule.  The liver capsule was violated. There was some bleeding from the liver parenchyma.  There was some spillage of bile from the gallbladder.  The gallbladder was eventually freed and placed in the EndoCatch bag through the left upper quadrant trocar site and then extracted from the abdominal cavity.  We then irrigated the right upper quadrant.  Hemostasis was achieved with electrocautery.  However, it took higher cautery setting for hemostasis. The liver surface was just a little bit raw where the gallbladder had been.  I elected to place a piece of surgical snow after the right upper quadrant had been irrigated with 2 L of saline.  There was no evidence of bleeding or bile leak.  At this point, we changed gloves and laid new drapes.  I anesthetized the subxiphoid old scar with local then made a vertical incision through the old scar, and then was able to deliver the port up out of the wound.  There were few Prolene sutures that were still intact, however two of them were not intact.  The Prolene sutures were removed.  I then detached the port from the band tubing.  The band tubing was then returned to the abdominal cavity.  At this point, I obtained a piece of Vicryl mesh and cut into a 1 inch x 1 inch square and sutured it to the back of the port with Prolene in 4 locations.  I then reinspected the abdomen, there was no evidence of bleeding.  As visualized when I came into the abdominal cavity, there was a small ventral hernia in her lower midline which had nothing within it.  It was left alone.  I then brought out the lap band tubing through the right mid abdomen 5-mm trocar site.  I then  enlarged that incision with a #15 blade.  I created a subcutaneous pocket.  The lap band tubing was reconnected to the port.  I then aspirated the port and was able to extract about 3-1/2 mL of fluid from the band.  I made sure that there was no air left in the system.  I then injected and placed 4 mL of saline into the lap band.  I then placed the port in a subcutaneous pocket that I created and then closed the deep subcutaneous fat with a running 2-0 Vicryl suture.  All the remaining skin incisions were closed with a 4-0 Monocryl in subcuticular fashion followed by the application of Dermabond.  All needle, instrument, sponge counts were correct x2. There were no immediate complications.  The patient tolerated the procedure well.  She was extubated and taken to recovery room in stable condition.  Mary Sella. Andrey Campanile, MD, FACS     EMW/MEDQ  D:  11/14/2012  T:  11/14/2012  Job:  [id number]

## 2012-11-14 NOTE — H&P (Signed)
Jamie Mccarthy is an 37 y.o. female.   Chief Complaint: here for surgery HPI:  37 year old African American female comes in for brief interval followup. I initially met her 09/17/2012. She complains of right upper quadrant pain. It generally occurs after eating and radiates to her back. Her ultrasound was negative. We ordered a HIDA scan to evaluate her gallbladder function. Her gallbladder ejection fraction is abnormally low at around 4% and moreover it reproduce her symptoms along with nausea and vomiting. She came back in to discuss the results of the tests.   At her last visit we also learned that she did have a laparoscopic adjustable gastric band surgery at [facility] in 2005. She had multiple problems with her port flipping requiring it to be repositioned several times. We do not have records at that time. Since that last visit been able to obtain some records from [facility]. The patient states that she has been having several episodes of regurgitation throughout the week. This has been long-standing and ongoing. She states that often the food will get stuck and sits there before it eventually goes down. She states that this is how she thought it was supposed to be. She also complains of nighttime reflux. She denies any cough. She has lost about 50 pounds since her weight loss surgery in 2005. She states her port is very tender to touch   Since her band was drained she saw the PA and 4cc was added back to her band. Despite this she reports no restriction and has gained 15 pounds.   PMHx, PSHx, SOCHx, FAMHx, ALL reviewed and unchanged   NUCLEAR MEDICINE HEPATOBILIARY IMAGING WITH GALLBLADDER EF: 09/17/12  Technique: Sequential images of the abdomen were obtained for 60  minutes following intravenous administration of  radiopharmaceutical. Patient then ingested 8 ounces of  commercially available Ensure Plus and imaging was continued for 60  minutes. A time-activity curve was generated from tracer within   the gallbladder following Ensure Plus ingestion, and the  gallbladder ejection fraction was calculated.  Radiopharmaceutical: 5 mCi Tc-81m mebrofenin  Comparison: None  Findings:  Prompt tracer extraction from bloodstream, indicating normal  hepatocellular function.  Prompt excretion of tracer into biliary tree.  Gallbladder visualized at 10 minutes.  Small bowel visualized at 16 minutes.  No hepatic retention of tracer.  Essentially no emptying of tracer from gallbladder following fatty  meal stimulation.  Calculated gallbladder ejection fraction is 4%, markedly decreased.  Patient experienced right upper quadrant pain, back pain, nausea  and vomiting following fatty meal ingestion. following fatty meal  ingestion.  IMPRESSION:  Patent biliary tree.  Abnormal gallbladder response to fatty meal stimulation with a  markedly decreased gallbladder ejection fraction of 4%.  Re-creation of patient's symptoms following fatty meal ingestion.  Normal values for gallbladder ejection fraction:  > 30% for exams utilizing sincalide (CCK)  > 33% for exams utilizing fatty meal stimulation with Ensure Plus   Past Medical History  Diagnosis Date  . PONV (postoperative nausea and vomiting)     has had PONV with previous c/s and also lap band surgery 2004  . GERD (gastroesophageal reflux disease)     diet controlled  . Anemia     Has sickle cell trait  . Iron deficiency anemia, unspecified 10/20/2012  . Hypertension   . Gestational diabetes   . Headache(784.0)   . Herpes 2007    hx of  . Seasonal allergies   . H/O vaginal delivery 2010    Past Surgical  History  Procedure Laterality Date  . Laparoscopic gastric banding  2005  . Cesarean section  2012  . Cesarean section N/A 07/16/2012    Procedure: REPEAT CESAREAN SECTION;  Surgeon: Oliver Pila, MD;  Location: [facility];  Service: Obstetrics;  Laterality: N/A;  . Bilateral salpingectomy  07/16/2012    Procedure: BILATERAL DISTAL  SALPINGECTOMY;  Surgeon: Oliver Pila, MD;  Location: [facility];  Service: Obstetrics;;  . Reposition gastric band  2006    x2  . Wisdom tooth extraction  1999  . Tubal ligation  2014    Family History  Problem Relation Age of Onset  . Diabetes      father's family  . Breast cancer Maternal Grandmother   . Hypertension Mother   . Thyroid cancer     Social History:  reports that she has never smoked. She has never used smokeless tobacco. She reports that she does not drink alcohol or use illicit drugs.  Allergies:  Allergies  Allergen Reactions  . Latex Itching    Pt states she gets severely itchy with latex    Medications Prior to Admission  Medication Sig Dispense Refill  . Ferrous Fumarate 324 MG TABS Take 1 tablet by mouth every other day.      . Prenatal Vit-Fe Fumarate-FA (MULTIVITAMIN-PRENATAL) 27-0.8 MG TABS tablet Take 1 tablet by mouth daily at 12 noon.      Weyman Croon Hazel (PREPARATION H EX) Apply topically as needed (hemmoroids).         No results found for this or any previous visit (from the past 48 hour(s)). No results found.  Review of Systems  Constitutional: Negative for fever, chills and weight loss.  Respiratory: Negative for shortness of breath.   Cardiovascular: Negative for chest pain, palpitations and orthopnea.  Gastrointestinal: Positive for nausea and abdominal pain. Negative for vomiting.  Genitourinary: Negative for dysuria and urgency.  Musculoskeletal: Negative for neck pain.  Neurological: Negative for dizziness, tingling, focal weakness, seizures and loss of consciousness.  Psychiatric/Behavioral: Negative for substance abuse.    Blood pressure 122/82, pulse 91, temperature 98.2 F (36.8 C), temperature source Oral, resp. rate 18, height 5\' 4"  (1.626 m), weight 235 lb 6 oz (106.765 kg), last menstrual period 08/24/2012, SpO2 100.00%, currently breastfeeding. Physical Exam  Vitals reviewed. Constitutional: She is oriented to person,  place, and time. She appears well-developed and well-nourished. No distress.  HENT:  Head: Normocephalic and atraumatic.  Right Ear: External ear normal.  Left Ear: External ear normal.  Eyes: Conjunctivae are normal. No scleral icterus.  Neck: No tracheal deviation present.  Cardiovascular: Normal rate.   Respiratory: Effort normal. No stridor. No respiratory distress.  GI: Soft. She exhibits no distension. There is no tenderness.  Musculoskeletal: She exhibits no edema.  Neurological: She is alert and oriented to person, place, and time.  Skin: Skin is warm and dry. She is not diaphoretic.  Psychiatric: She has a normal mood and affect. Her behavior is normal. Judgment and thought content normal.     Assessment/Plan Morbid obesity S/p LapBand placement at [facility] Flipped LapBand port Biliary dyskinesia  To OR for Lap chole and revision of LapBand port All questions asked and answered.  Discussed postop course  Mary Sella. Andrey Campanile, MD, FACS General, Bariatric, & Minimally Invasive Surgery [facility], [place]   [facility] M 11/14/2012, 10:16 AM

## 2012-11-14 NOTE — Anesthesia Postprocedure Evaluation (Signed)
  Anesthesia Post-op Note  Patient: Jamie Mccarthy  Procedure(s) Performed: Procedure(s) (LRB): GASTRIC BANDING PORT REVISION (N/A) LAPAROSCOPIC CHOLECYSTECTOMY (N/A)  Patient Location: PACU  Anesthesia Type: General  Level of Consciousness: awake and alert   Airway and Oxygen Therapy: Patient Spontanous Breathing  Post-op Pain: mild  Post-op Assessment: Post-op Vital signs reviewed, Patient's Cardiovascular Status Stable, Respiratory Function Stable, Patent Airway and No signs of Nausea or vomiting  Last Vitals:  Filed Vitals:   11/14/12 1300  BP: 138/82  Pulse: 69  Temp:   Resp: 20    Post-op Vital Signs: stable   Complications: No apparent anesthesia complications

## 2012-11-14 NOTE — Anesthesia Preprocedure Evaluation (Addendum)
Anesthesia Evaluation  Patient identified by MRN, date of birth, ID band Patient awake    Reviewed: Allergy & Precautions, H&P , NPO status , Patient's Chart, lab work & pertinent test results  History of Anesthesia Complications (+) PONV  Airway Mallampati: II TM Distance: >3 FB Neck ROM: full    Dental no notable dental hx. (+) Teeth Intact and Dental Advisory Given   Pulmonary neg pulmonary ROS,  breath sounds clear to auscultation  Pulmonary exam normal       Cardiovascular Exercise Tolerance: Good negative cardio ROS  Rhythm:regular Rate:Normal  No meds now for htn   Neuro/Psych negative neurological ROS  negative psych ROS   GI/Hepatic negative GI ROS, Neg liver ROS, GERD-  Controlled,  Endo/Other  negative endocrine ROSMorbid obesityGestational diabetes  Renal/GU negative Renal ROS  negative genitourinary   Musculoskeletal   Abdominal (+) + obese,   Peds  Hematology negative hematology ROS (+) Sickle cell trait   Anesthesia Other Findings   Reproductive/Obstetrics negative OB ROS                        Anesthesia Physical Anesthesia Plan  ASA: II  Anesthesia Plan: General   Post-op Pain Management:    Induction: Intravenous  Airway Management Planned: Oral ETT  Additional Equipment:   Intra-op Plan:   Post-operative Plan: Extubation in OR  Informed Consent: I have reviewed the patients History and Physical, chart, labs and discussed the procedure including the risks, benefits and alternatives for the proposed anesthesia with the patient or authorized representative who has indicated his/her understanding and acceptance.   Dental Advisory Given  Plan Discussed with: CRNA and Surgeon  Anesthesia Plan Comments:         Anesthesia Quick Evaluation

## 2012-11-14 NOTE — Preoperative (Signed)
Beta Blockers   Reason not to administer Beta Blockers:Not Applicable 

## 2012-11-14 NOTE — Brief Op Note (Signed)
11/14/2012  12:46 PM  PATIENT:  Chenay A Lindholm  37 y.o. female  PRE-OPERATIVE DIAGNOSIS:  FLIPPED LAP BAND port  biliary dyskinesia   POST-OPERATIVE DIAGNOSIS:  BILIARY DYSKENESIA AND FLIPPED LAP BAND PORT  PROCEDURE:  Procedure(s) with comments: GASTRIC BANDING PORT REVISION (N/A) - PORT PLACEMENT MOVED NEW MESH INSERTED  LAPAROSCOPIC CHOLECYSTECTOMY (N/A)  SURGEON:  Surgeon(s) and Role:    * Atilano Ina, MD - Primary    * Romie Levee, MD - Assisting  PHYSICIAN ASSISTANT: none  ASSISTANTS: see above   ANESTHESIA:   general  EBL:  Total I/O In: 1000 [I.V.:1000] Out: -   BLOOD ADMINISTERED:none  DRAINS: none   LOCAL MEDICATIONS USED:  MARCAINE     SPECIMEN:  Source of Specimen:  gallbladder  DISPOSITION OF SPECIMEN:  PATHOLOGY  COUNTS:  YES  TOURNIQUET:  * No tourniquets in log *  DICTATION: .Other Dictation: Dictation Number [id number]  PLAN OF CARE: Discharge to home after PACU  PATIENT DISPOSITION:  PACU - hemodynamically stable.   Delay start of Pharmacological VTE agent (>24hrs) due to surgical blood loss or risk of bleeding: not applicable  Mary Sella. Andrey Campanile, MD, FACS General, Bariatric, & Minimally Invasive Surgery [facility], [place]

## 2012-11-14 NOTE — Transfer of Care (Signed)
Immediate Anesthesia Transfer of Care Note  Patient: Jamie Mccarthy  Procedure(s) Performed: Procedure(s) with comments: GASTRIC BANDING PORT REVISION (N/A) - PORT PLACEMENT MOVED NEW MESH INSERTED  LAPAROSCOPIC CHOLECYSTECTOMY (N/A)  Patient Location: PACU  Anesthesia Type:General  Level of Consciousness: awake, alert , oriented and patient cooperative  Airway & Oxygen Therapy: Patient Spontanous Breathing and Patient connected to face mask oxygen  Post-op Assessment: Report given to PACU RN, Post -op Vital signs reviewed and stable and Patient moving all extremities  Post vital signs: Reviewed and stable  Complications: No apparent anesthesia complications

## 2012-11-17 ENCOUNTER — Encounter (HOSPITAL_COMMUNITY): Payer: Self-pay | Admitting: General Surgery

## 2012-11-26 ENCOUNTER — Telehealth (INDEPENDENT_AMBULATORY_CARE_PROVIDER_SITE_OTHER): Payer: Self-pay

## 2012-11-26 NOTE — Telephone Encounter (Signed)
Patient called in stating she is having itching at her incision sites with small red rash and believes it is from the glue that was used to seal the incision. I advised for her to get some vaseline on a cotton ball and rub over the glue to get the rest off. Per protocol advised patient to try Benadryl 25 mg one tablet every 6 hours prn for itching. She will try this today and if symptoms persist or worsen she will call back. Advised I will send a message to Dr Andrey Campanile and if he suggests anything else we will call her back.

## 2012-11-27 ENCOUNTER — Other Ambulatory Visit: Payer: Self-pay

## 2012-11-28 NOTE — Telephone Encounter (Signed)
Left message on machine for patient to call back and ask for me or triage. To check on patient and make sure her symptoms have resolved.

## 2012-11-28 NOTE — Telephone Encounter (Signed)
Sounds good. Jamie Mccarthy - will you call pt and see if symptoms resolved

## 2012-11-29 ENCOUNTER — Encounter (HOSPITAL_COMMUNITY): Payer: Self-pay | Admitting: Emergency Medicine

## 2012-11-29 ENCOUNTER — Emergency Department (HOSPITAL_COMMUNITY)
Admission: EM | Admit: 2012-11-29 | Discharge: 2012-11-29 | Disposition: A | Discharge status: Home / Self Care | Payer: 59 | Attending: Emergency Medicine | Admitting: Emergency Medicine

## 2012-11-29 ENCOUNTER — Emergency Department (HOSPITAL_COMMUNITY): Payer: 59

## 2012-11-29 DIAGNOSIS — G8918 Other acute postprocedural pain: Secondary | ICD-10-CM

## 2012-11-29 DIAGNOSIS — K219 Gastro-esophageal reflux disease without esophagitis: Secondary | ICD-10-CM | POA: Insufficient documentation

## 2012-11-29 DIAGNOSIS — D509 Iron deficiency anemia, unspecified: Secondary | ICD-10-CM | POA: Insufficient documentation

## 2012-11-29 DIAGNOSIS — I10 Essential (primary) hypertension: Secondary | ICD-10-CM | POA: Insufficient documentation

## 2012-11-29 DIAGNOSIS — D573 Sickle-cell trait: Secondary | ICD-10-CM | POA: Insufficient documentation

## 2012-11-29 DIAGNOSIS — Y838 Other surgical procedures as the cause of abnormal reaction of the patient, or of later complication, without mention of misadventure at the time of the procedure: Secondary | ICD-10-CM | POA: Insufficient documentation

## 2012-11-29 DIAGNOSIS — E669 Obesity, unspecified: Secondary | ICD-10-CM | POA: Insufficient documentation

## 2012-11-29 DIAGNOSIS — Z9884 Bariatric surgery status: Secondary | ICD-10-CM | POA: Insufficient documentation

## 2012-11-29 DIAGNOSIS — N390 Urinary tract infection, site not specified: Secondary | ICD-10-CM | POA: Insufficient documentation

## 2012-11-29 DIAGNOSIS — Z79899 Other long term (current) drug therapy: Secondary | ICD-10-CM | POA: Insufficient documentation

## 2012-11-29 DIAGNOSIS — Z9089 Acquired absence of other organs: Secondary | ICD-10-CM | POA: Insufficient documentation

## 2012-11-29 DIAGNOSIS — Z9851 Tubal ligation status: Secondary | ICD-10-CM | POA: Insufficient documentation

## 2012-11-29 DIAGNOSIS — R109 Unspecified abdominal pain: Secondary | ICD-10-CM | POA: Insufficient documentation

## 2012-11-29 DIAGNOSIS — Z91040 Latex allergy status: Secondary | ICD-10-CM | POA: Insufficient documentation

## 2012-11-29 DIAGNOSIS — Z3202 Encounter for pregnancy test, result negative: Secondary | ICD-10-CM | POA: Insufficient documentation

## 2012-11-29 DIAGNOSIS — Z8632 Personal history of gestational diabetes: Secondary | ICD-10-CM | POA: Insufficient documentation

## 2012-11-29 DIAGNOSIS — IMO0002 Reserved for concepts with insufficient information to code with codable children: Secondary | ICD-10-CM | POA: Insufficient documentation

## 2012-11-29 DIAGNOSIS — Z8619 Personal history of other infectious and parasitic diseases: Secondary | ICD-10-CM | POA: Insufficient documentation

## 2012-11-29 LAB — CBC WITH DIFFERENTIAL/PLATELET
Basophils Absolute: 0 10*3/uL (ref 0.0–0.1)
Eosinophils Absolute: 1.1 10*3/uL — ABNORMAL HIGH (ref 0.0–0.7)
Eosinophils Relative: 11 % — ABNORMAL HIGH (ref 0–5)
HCT: 35.1 % — ABNORMAL LOW (ref 36.0–46.0)
MCH: 24.4 pg — ABNORMAL LOW (ref 26.0–34.0)
MCV: 73.1 fL — ABNORMAL LOW (ref 78.0–100.0)
Monocytes Absolute: 0.5 10*3/uL (ref 0.1–1.0)
Platelets: 341 10*3/uL (ref 150–400)
RDW: 17.4 % — ABNORMAL HIGH (ref 11.5–15.5)
WBC: 9.8 10*3/uL (ref 4.0–10.5)

## 2012-11-29 LAB — COMPREHENSIVE METABOLIC PANEL
ALT: 11 U/L (ref 0–35)
AST: 15 U/L (ref 0–37)
Alkaline Phosphatase: 117 U/L (ref 39–117)
BUN: 11 mg/dL (ref 6–23)
Calcium: 9.5 mg/dL (ref 8.4–10.5)
Chloride: 101 mEq/L (ref 96–112)
Creatinine, Ser: 0.6 mg/dL (ref 0.50–1.10)
GFR calc Af Amer: >90 mL/min (ref >90)
GFR calc non Af Amer: >90 mL/min (ref >90)
Glucose, Bld: 121 mg/dL — ABNORMAL HIGH (ref 70–99)
Sodium: 137 mEq/L (ref 135–145)
Total Protein: 6.9 g/dL (ref 6.0–8.3)

## 2012-11-29 LAB — LIPASE, BLOOD: Lipase: 48 U/L (ref 11–59)

## 2012-11-29 LAB — URINALYSIS, ROUTINE W REFLEX MICROSCOPIC
Bilirubin Urine: NEGATIVE
Hgb urine dipstick: NEGATIVE
Nitrite: NEGATIVE
Protein, ur: NEGATIVE mg/dL
Specific Gravity, Urine: 1.015 (ref 1.005–1.030)
Urobilinogen, UA: 0.2 mg/dL (ref 0.0–1.0)

## 2012-11-29 LAB — POCT PREGNANCY, URINE: Preg Test, Ur: NEGATIVE

## 2012-11-29 LAB — URINE MICROSCOPIC-ADD ON

## 2012-11-29 IMAGING — NM NM HEPATOBILIARY IMAGE, INC GB
1 series · 12 of 12 positions shown · non-contrast
Comparison: CT abdomen pelvis dated [DATE] at [B6] hr

RADIOPHARMACEUTICALS:  5.0 mCi [B6] Choletec

CLINICAL DATA: Abdominal pain, status post cholecystectomy on
[DATE], evaluate for bile leak

EXAM:
NUCLEAR MEDICINE HEPATOBILIARY IMAGING
TECHNIQUE: Sequential images of the abdomen were obtained [DATE] minutes
following intravenous administration of radiopharmaceutical.

[Series 1: hepato 1st hr · 4.46mm/px · 2 acquisitions, 12 frames shown]
[im 1/2]
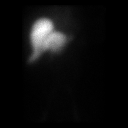
[im 1/2]
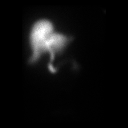
[im 1/2]
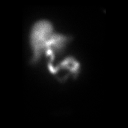
[im 1/2]
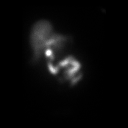
[im 1/2]
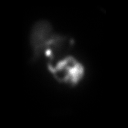
[im 1/2]
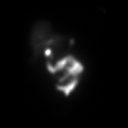
[im 2/2]
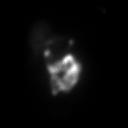
[im 2/2]
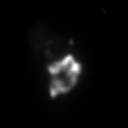
[im 2/2]
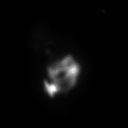
[im 2/2]
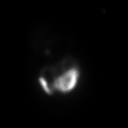
[im 2/2]
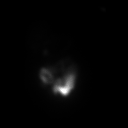
[im 2/2]
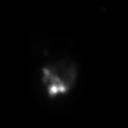

[12 of 12 positions shown; findings below may reference images not displayed]

FINDINGS: Imaging was performed for 120 min.

Normal hepatic excretion.

Contrast opacifies small bowel within 15 min.

Free pertechnetate within the stomach at 45 min.

A small amount of radiotracer activity is present inferior to the
gallbladder fossa on static images during the second hour, but this
appears to slightly move on the dynamic images and decreases with
time, and is therefore favored to be within proximal small bowel.

No evidence of extraluminal radiotracer to suggest a bile leak.
IMPRESSION: No bile leak is visualized.

## 2012-11-29 IMAGING — CT CT ABD-PELV W/ CM
1 of 2 series · 15 of 32 positions shown, 19 images · IV contrast (OMNIPAQUE 300)
Comparison: CT [DATE].

CLINICAL DATA: Status post recent cholecystectomy.

EXAM:
CT ABDOMEN AND PELVIS WITH CONTRAST
TECHNIQUE: Multidetector CT imaging of the abdomen and pelvis was performed
using the standard protocol following bolus administration of
intravenous contrast.
CONTRAST:  50mL OMNIPAQUE IOHEXOL 300 MG/ML SOLN, 100mL OMNIPAQUE
IOHEXOL 300 MG/ML SOLN

[Series 2: abd/pel with · axial · 0.85mm/px · z∈[-476,-26]mm · 15 of 98 slices shown, 19 images]
[im 4/98  soft-tissue]
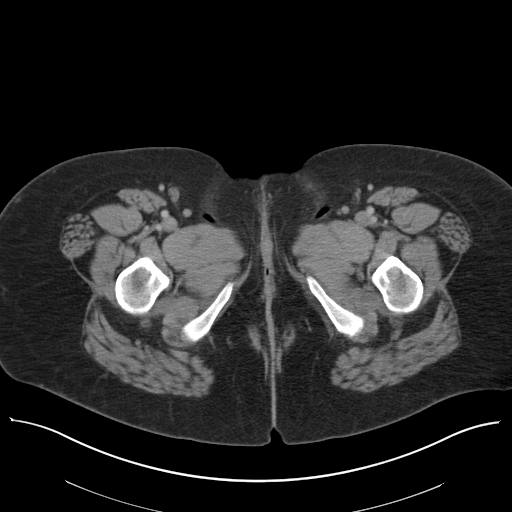
[im 4/98  bone]
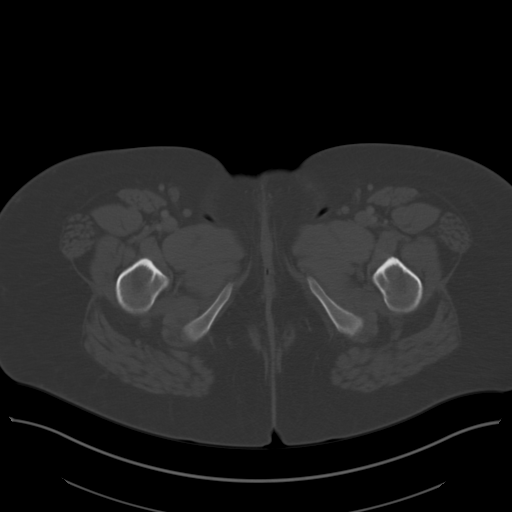
[im 12/98  soft-tissue]
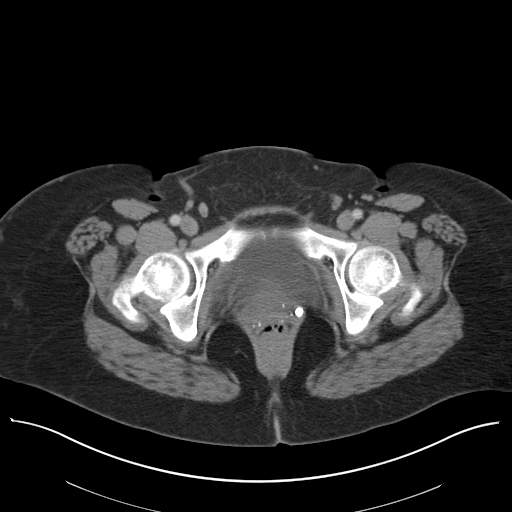
[im 19/98  soft-tissue]
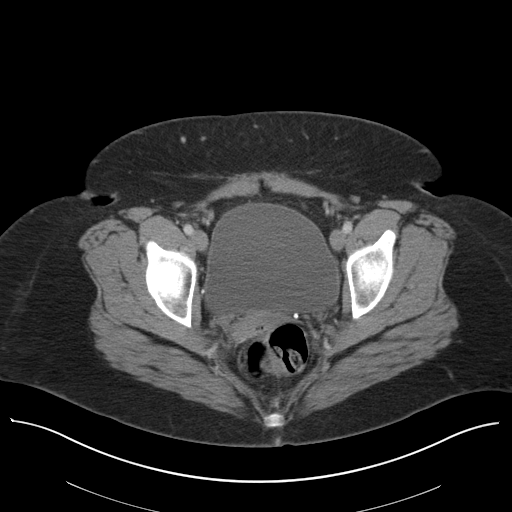
[im 27/98  soft-tissue]
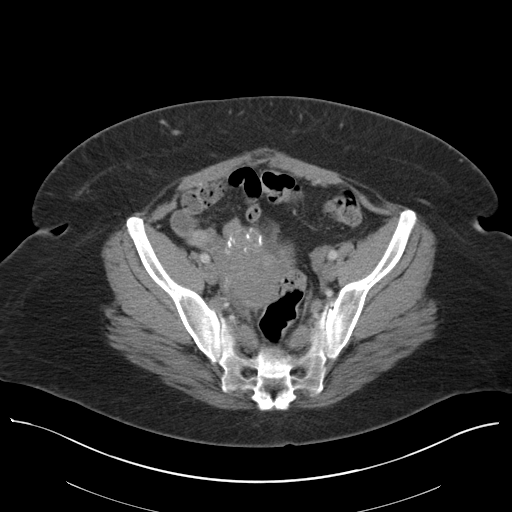
[im 34/98  soft-tissue]
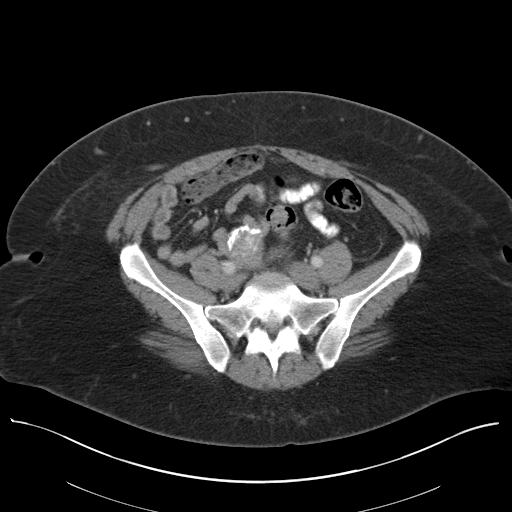
[im 42/98  soft-tissue]
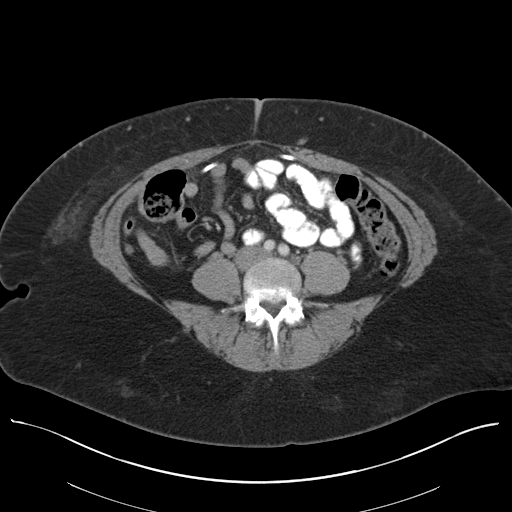
[im 49/98  soft-tissue]
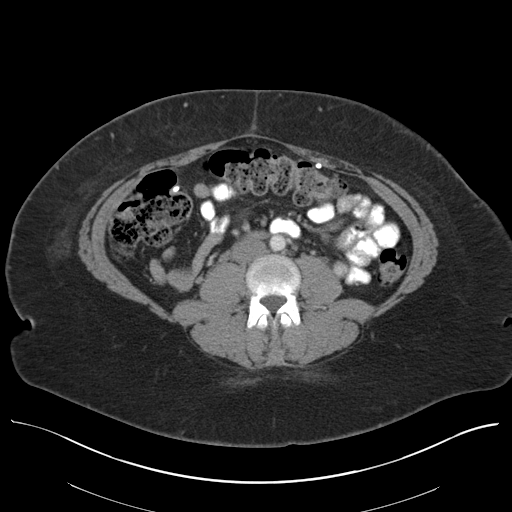
[im 56/98  soft-tissue]
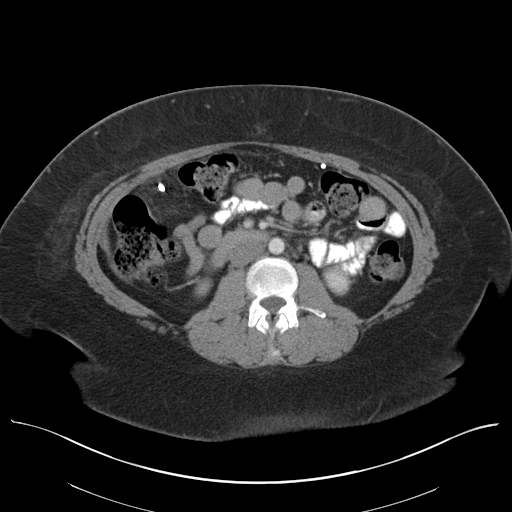
[im 64/98  soft-tissue]
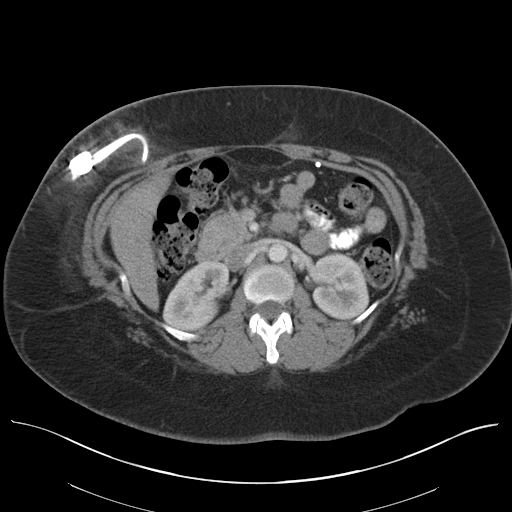
[im 64/98  bone]
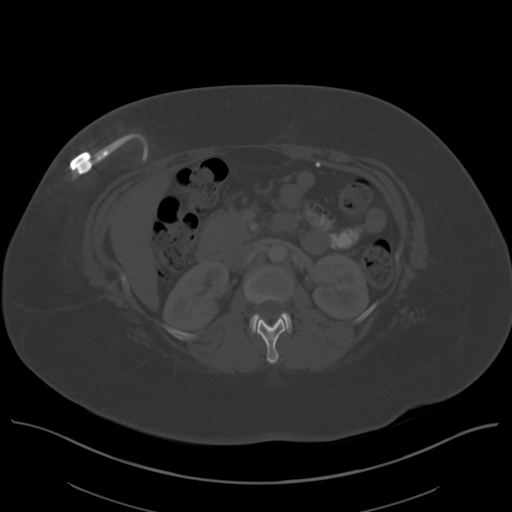
[im 71/98  soft-tissue]
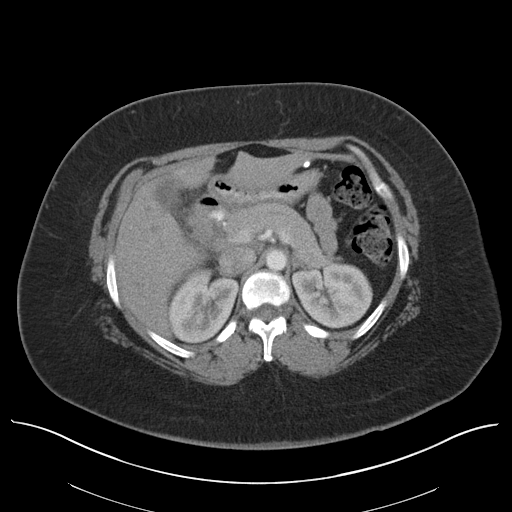
[im 79/98  soft-tissue]
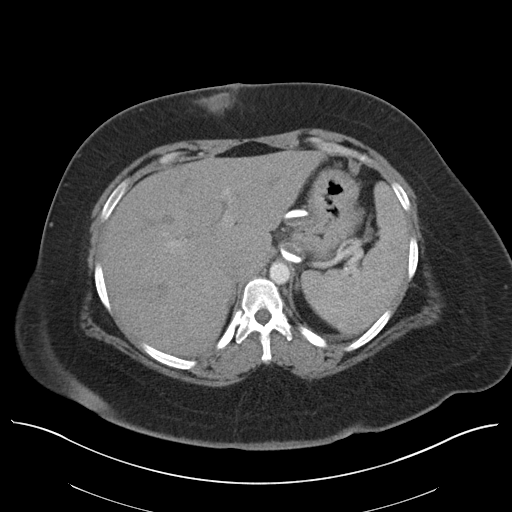
[im 83/98  lung]
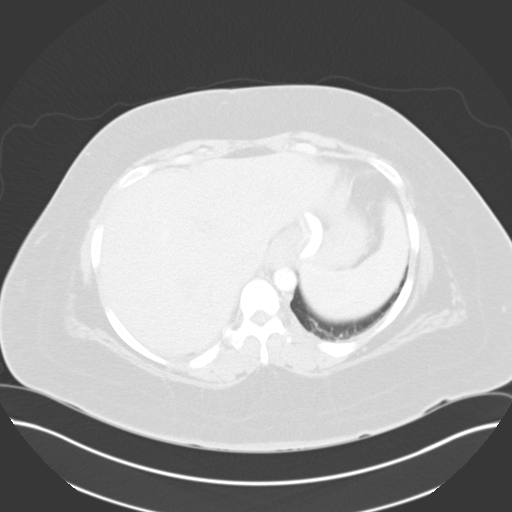
[im 86/98  soft-tissue]
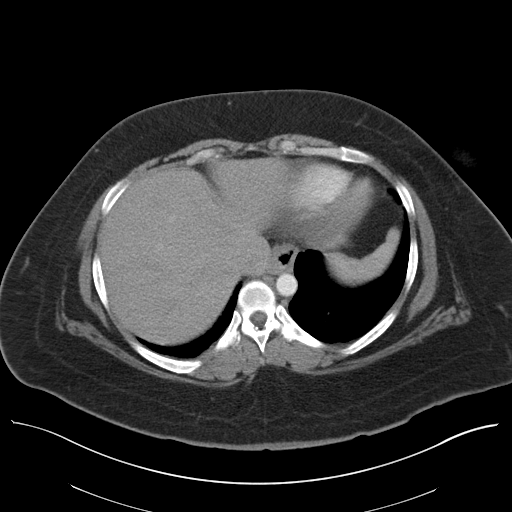
[im 86/98  lung]
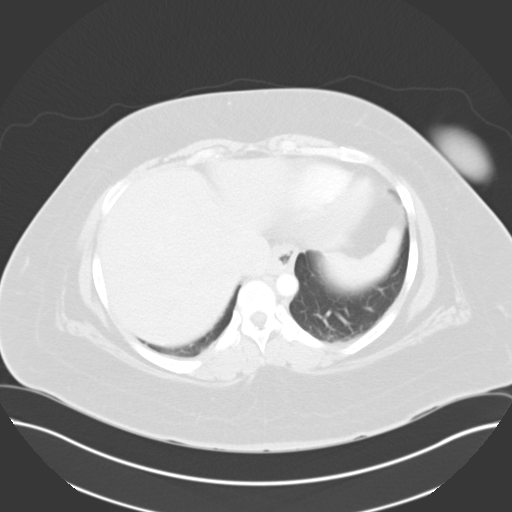
[im 90/98  lung]
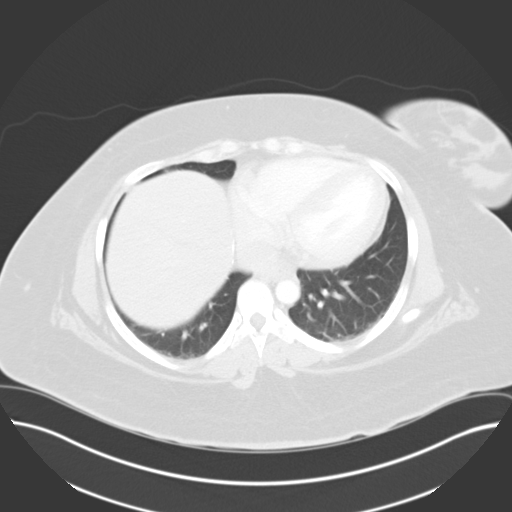
[im 94/98  soft-tissue]
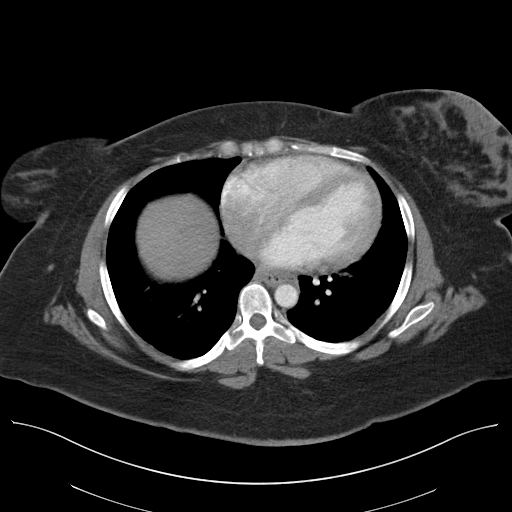
[im 94/98  lung]
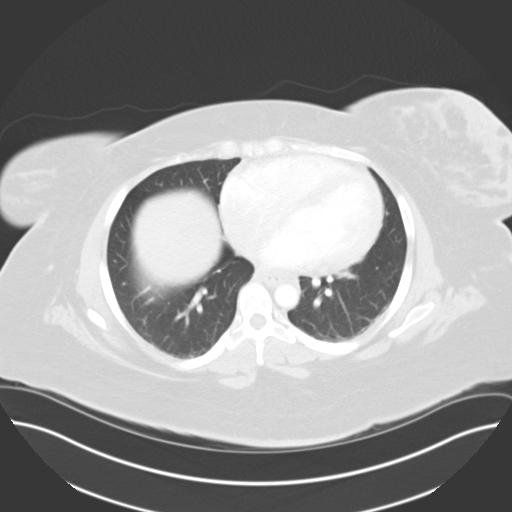

[15 of 32 positions shown; findings below may reference images not displayed]

FINDINGS: Lung bases are clear.  No pericardial fluid.

No focal hepatic lesion. There is fluid within the gallbladder fossa
following cholecystectomy with a tiny locule of gas or fat. There is
no enhancement of this fluid collection. Cholecystectomy clips
noted. No intrahepatic biliary duct dilatation. The common bile duct
is normal caliber. The pancreas, spleen, adrenal glands are normal.
Kidneys enhance symmetrically without evidence obstruction.

There is a gastric banding device at the gastric cardia. The tubing
is continuous with the ACCESS port in the right anterior abdominal
subcutaneous tissue. Prior site identified more medial in the right
abdominal wall. There is no evidence of abscess or infection
associated with either site

The small bowel, appendix, cecum are normal. No evidence of bowel
obstruction. The colon and rectosigmoid colon are normal. There is a
calcified leiomyoma within the uterus. No pelvic lymphadenopathy.
Insert then
IMPRESSION: 1. Fluid in the gallbladder fossa is likely sterile fluid from
recent cholecystectomy. Cannot exclude a small biloma but this is
not favored.

2.  No evidence of abscess in the abdomen or pelvis.

3. No evidence of complication following repositioning of the
gastric banding ACCESS port.

## 2012-11-29 IMAGING — CR DG ABDOMEN ACUTE W/ 1V CHEST
4 series · 4 of 4 positions shown · non-contrast
Comparison: Prior radiograph from [DATE] and [DATE]

CLINICAL DATA: Right lower quadrant pain

EXAM:
ACUTE ABDOMEN SERIES (ABDOMEN 2 VIEW & CHEST 1 VIEW)

[w chest pa]
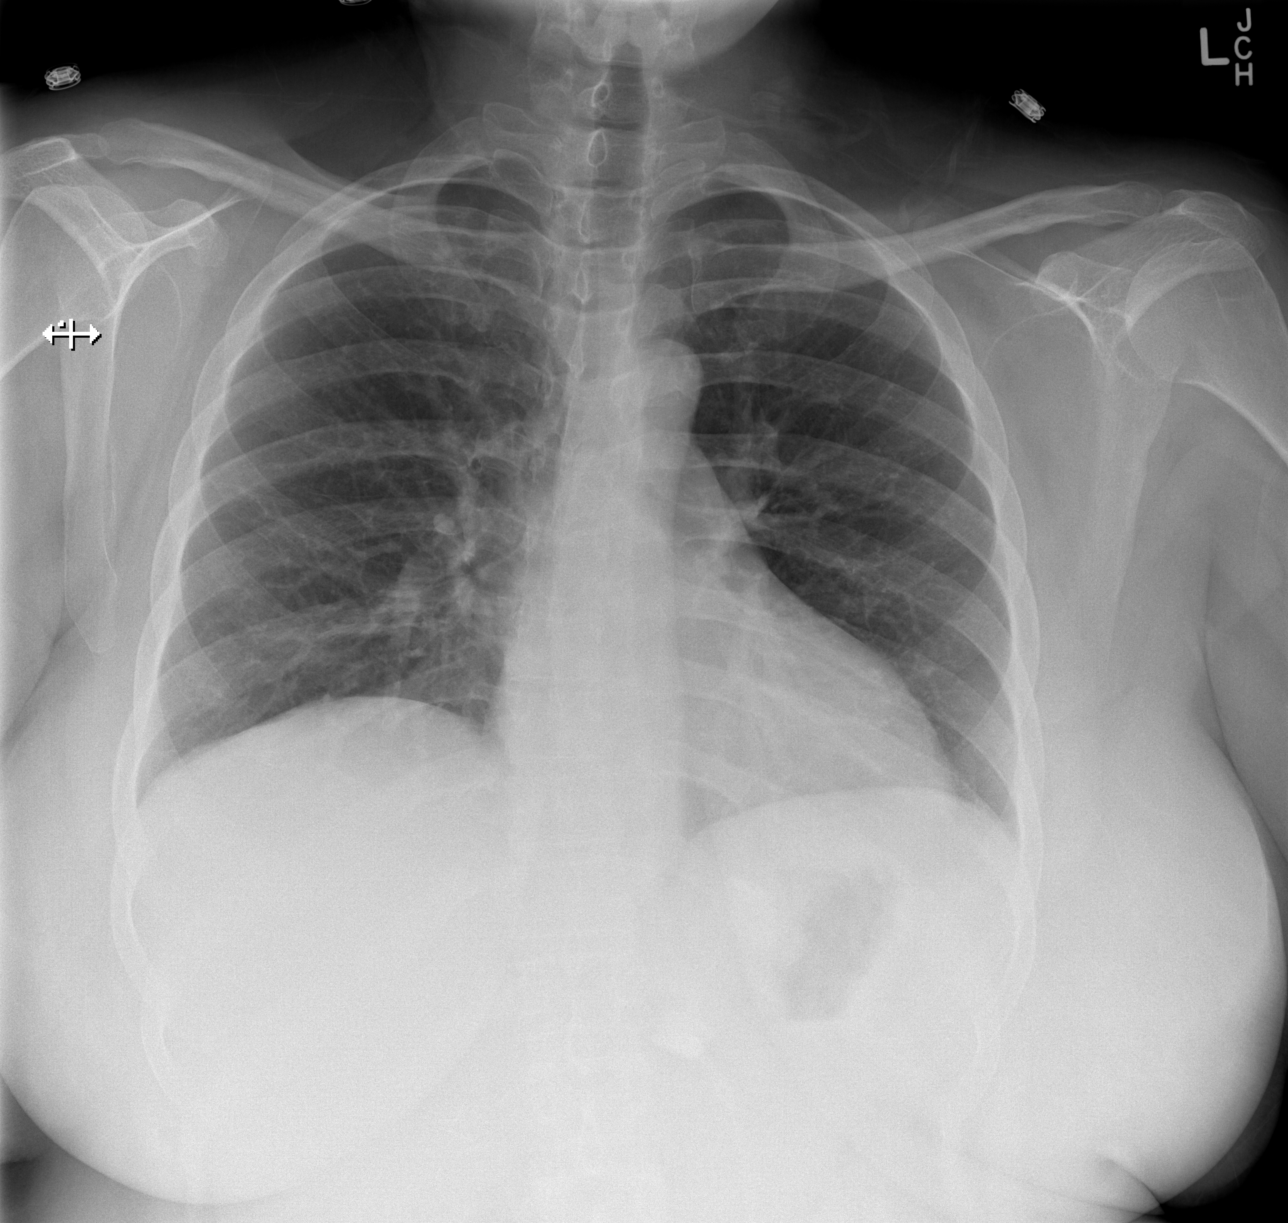

[w abdomen upright]
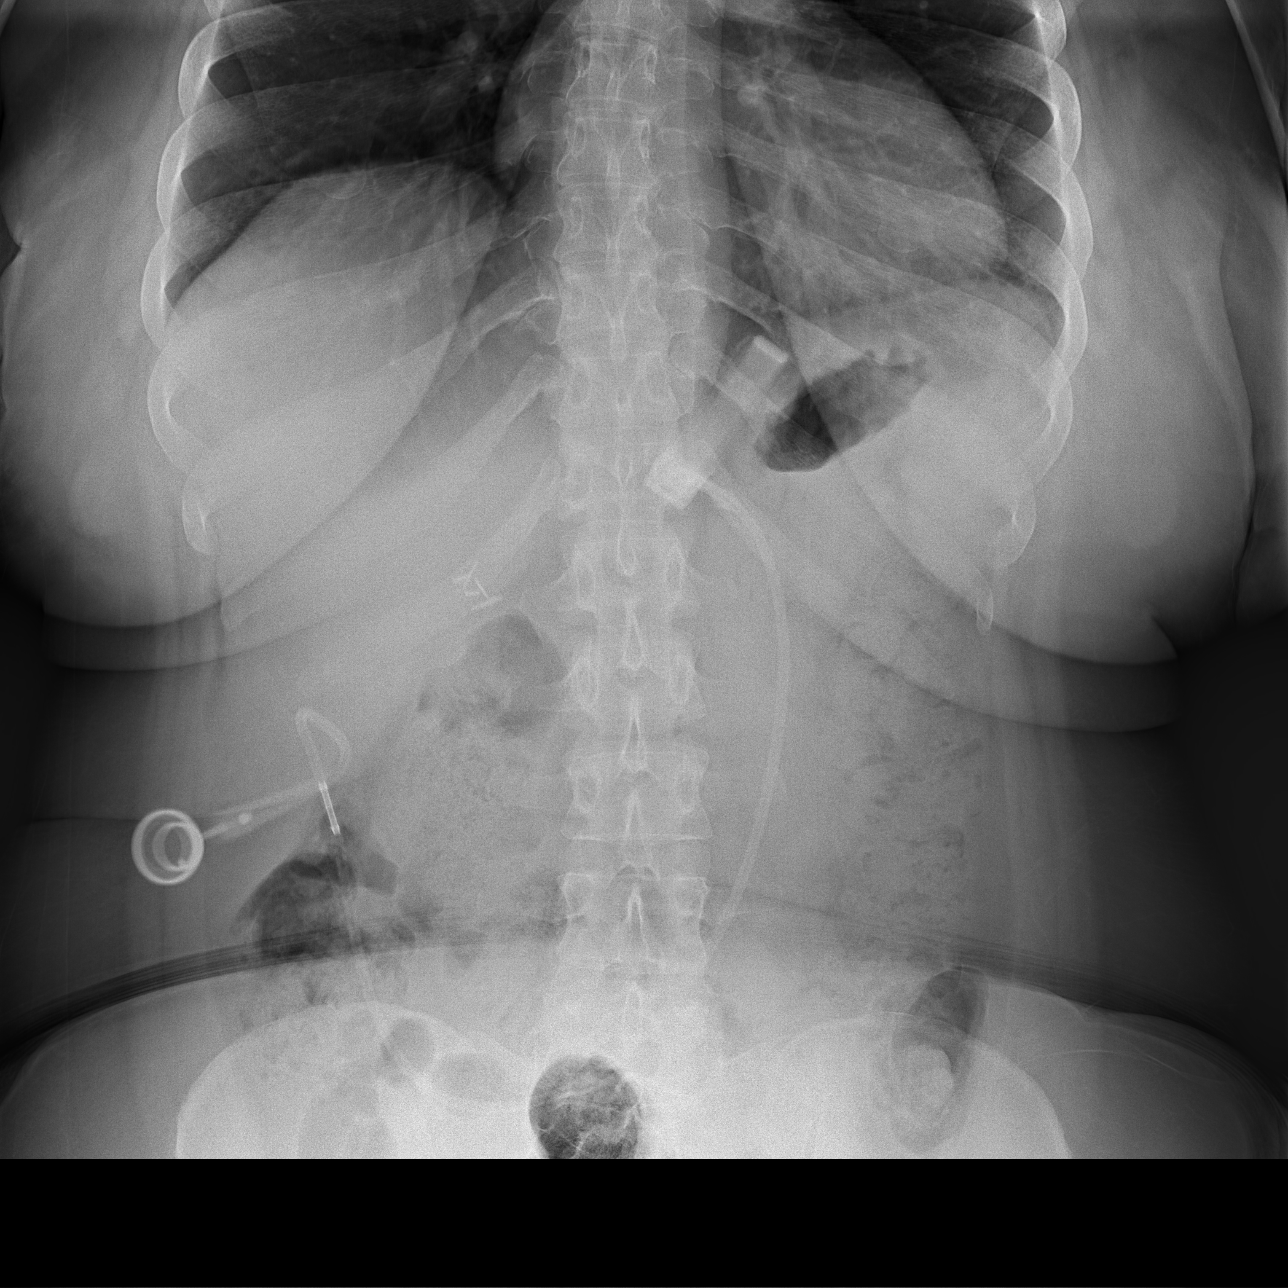

[t abdomen supine (1 of 2)]
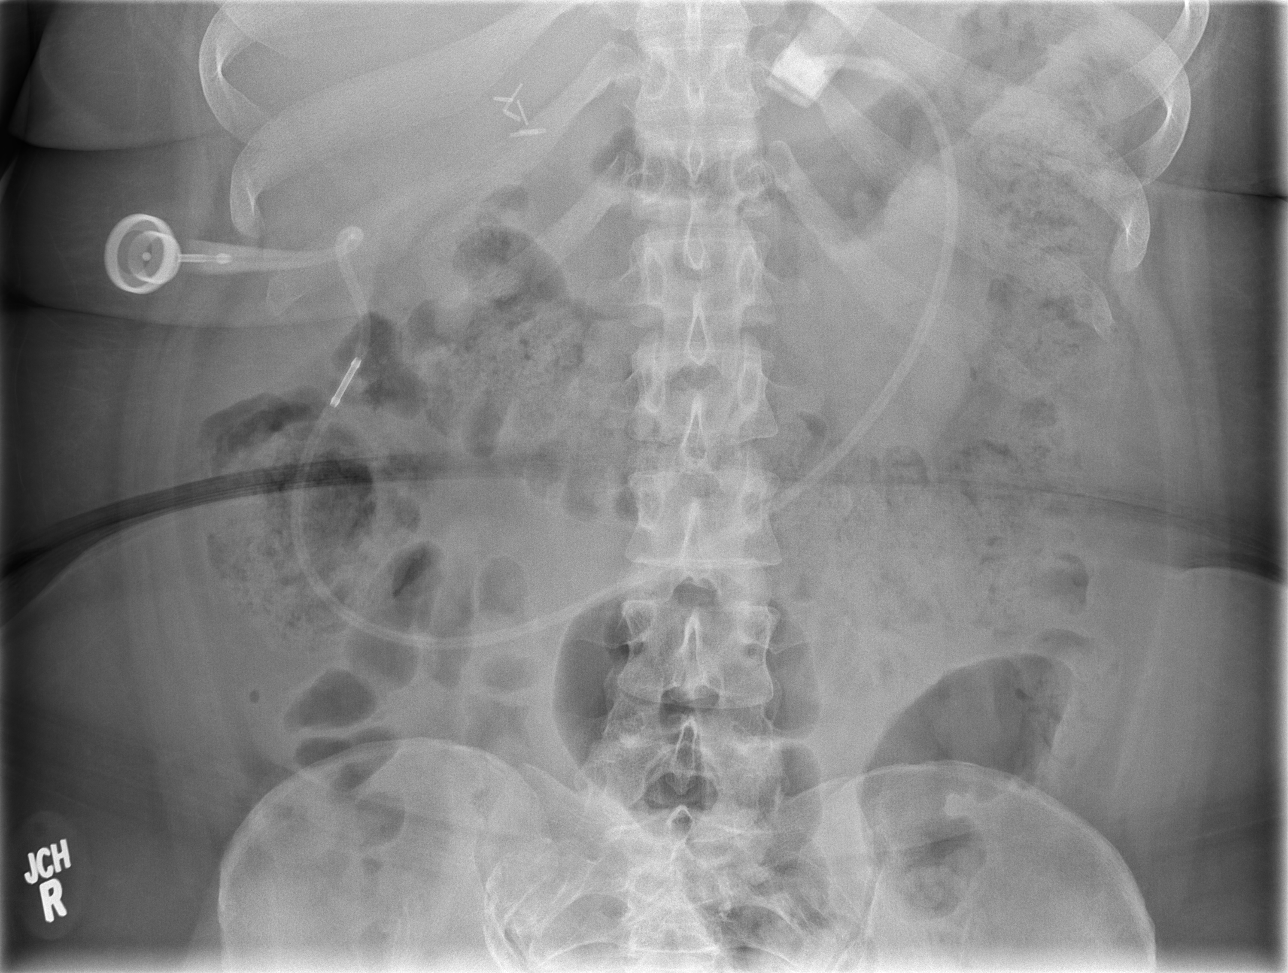

[t abdomen supine (2 of 2)]
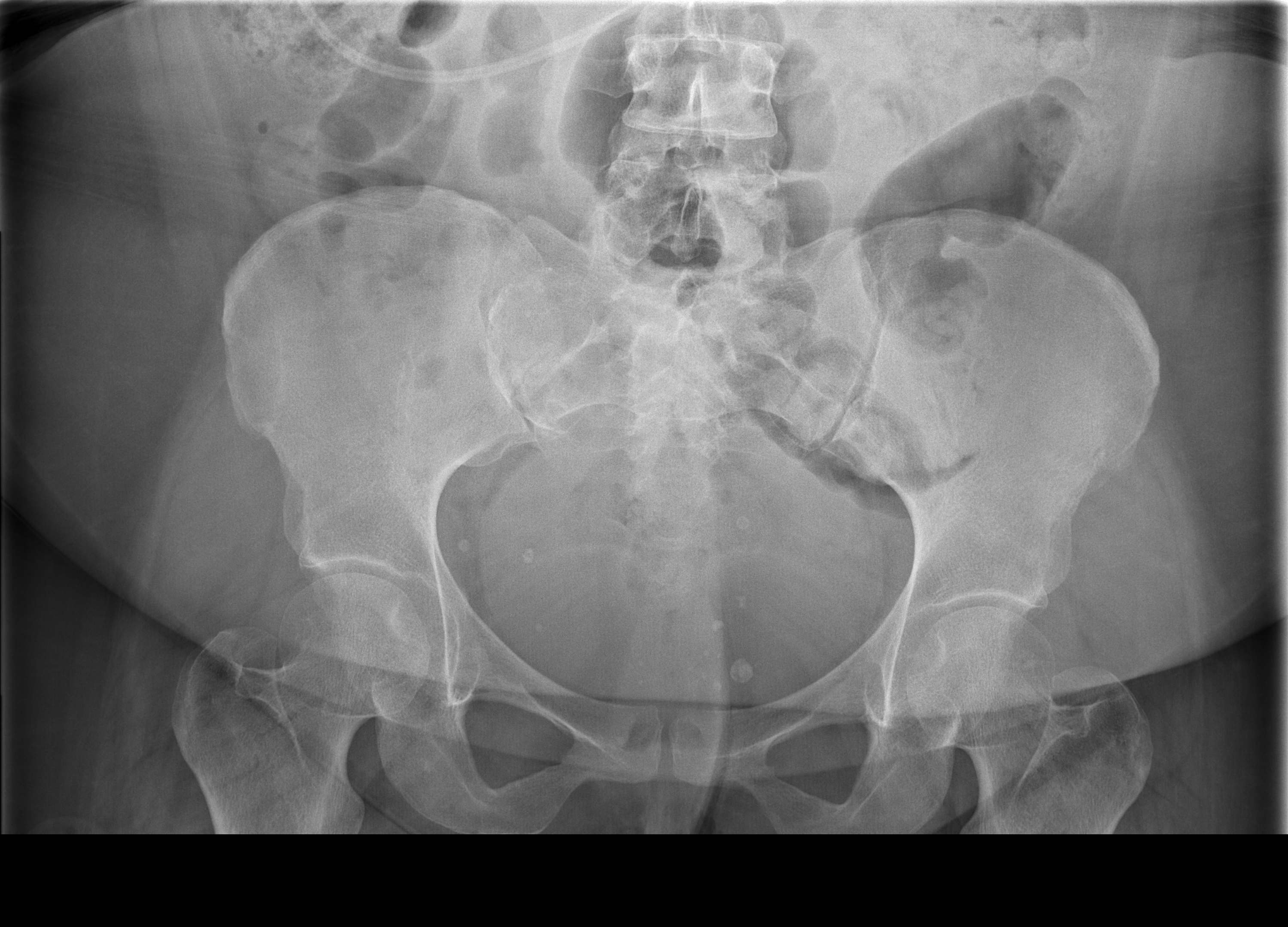

[4 of 4 positions shown; findings below may reference images not displayed]

FINDINGS: There is no evidence of dilated bowel loops or free intraperitoneal
air. No radiopaque calculi or other significant radiographic
abnormality is seen. Gastric lap band again noted. Cholecystectomy
clips present right upper quadrant.

Heart size and mediastinal contours are within normal limits. Both
lungs are clear.
IMPRESSION: Negative abdominal radiographs.  No acute cardiopulmonary disease.

## 2012-11-29 MED ORDER — OXYCODONE-ACETAMINOPHEN 5-325 MG PO TABS: 1.0000 | ORAL_TABLET | ORAL | Status: DC | PRN

## 2012-11-29 MED ORDER — OXYCODONE-ACETAMINOPHEN 5-325 MG PO TABS: 2.0000 | ORAL_TABLET | Freq: Once | ORAL | Status: DC

## 2012-11-29 MED ORDER — IOHEXOL 300 MG/ML  SOLN
100.0000 mL | Freq: Once | INTRAMUSCULAR | Status: AC | PRN
  Administered 2012-11-29: 100 mL via INTRAVENOUS

## 2012-11-29 MED ORDER — MORPHINE SULFATE 4 MG/ML IJ SOLN
4.0000 mg | Freq: Once | INTRAMUSCULAR | Status: AC
  Administered 2012-11-29: 4 mg via INTRAVENOUS
  Filled 2012-11-29: qty 1

## 2012-11-29 MED ORDER — CEPHALEXIN 500 MG PO CAPS: 500.0000 mg | ORAL_CAPSULE | Freq: Two times a day (BID) | ORAL | Status: DC

## 2012-11-29 MED ORDER — TECHNETIUM TC 99M MEBROFENIN IV KIT
5.0000 | PACK | Freq: Once | INTRAVENOUS | Status: AC | PRN
  Administered 2012-11-29: 5 via INTRAVENOUS

## 2012-11-29 MED ORDER — IOHEXOL 300 MG/ML  SOLN
50.0000 mL | Freq: Once | INTRAMUSCULAR | Status: AC | PRN
  Administered 2012-11-29: 50 mL via ORAL

## 2012-11-29 MED ORDER — FLUCONAZOLE 150 MG PO TABS: 150.0000 mg | ORAL_TABLET | Freq: Once | ORAL | Status: DC

## 2012-11-29 MED ORDER — OXYCODONE-ACETAMINOPHEN 5-325 MG PO TABS
2.0000 | ORAL_TABLET | Freq: Once | ORAL | Status: AC
  Administered 2012-11-29: 2 via ORAL
  Filled 2012-11-29: qty 2

## 2012-11-29 NOTE — ED Notes (Signed)
Pt arrived to the ED with a complaint of abdominal pain from her surgery to remove her gall bladder.  Pt states the operation was two weeks ago on the 24th Oct 2014.  Pt states she has had swelling since.  Pt states the incising and surrounding area is warm to the touch.

## 2012-11-29 NOTE — ED Notes (Signed)
Pt not in room at this time

## 2012-11-29 NOTE — ED Provider Notes (Signed)
CSN: [id number]     Arrival date & time 11/29/12  0246 History   First MD Initiated Contact with Patient 11/29/12 0518     Chief Complaint  Patient presents with  . Post-op Problem   HPI  History provided by the patient. Patient is a 37 year old female with history of hypertension, gastric banding, and recent cholecystectomy who presents with worsened abdominal pain. Patient reports having cholecystectomy performed by Dr. Andrey Campanile 2 weeks ago. At that time she also had repositioning of her gastric banding port. Since that time she has had some pains in her abdomen which she was taking Percocet for. She was doing relatively well until the past few days she's had worsening pain and discomfort. Pain is cramping and sometimes sharp pain without significant radiation. It is greatest in the right abdomen near the port. She also reports feeling some slight distention of the abdomen and increased warmth of the skin on the right side. She denies any redness or rash. There's been no bleeding or drainage from her incisions. She denies any associated fever, chills or sweats. No nausea, vomiting, diarrhea constipation. No other aggravating or alleviating factors. No other associated symptoms.    Past Medical History  Diagnosis Date  . PONV (postoperative nausea and vomiting)     has had PONV with previous c/s and also lap band surgery 2004  . GERD (gastroesophageal reflux disease)     diet controlled  . Anemia     Has sickle cell trait  . Iron deficiency anemia, unspecified 10/20/2012  . Hypertension   . Gestational diabetes   . Headache(784.0)   . Herpes 2007    hx of  . Seasonal allergies   . H/O vaginal delivery 2010   Past Surgical History  Procedure Laterality Date  . Laparoscopic gastric banding  2005  . Cesarean section  2012  . Cesarean section N/A 07/16/2012    Procedure: REPEAT CESAREAN SECTION;  Surgeon: Oliver Pila, MD;  Location: [facility];  Service: Obstetrics;  Laterality: N/A;   . Bilateral salpingectomy  07/16/2012    Procedure: BILATERAL DISTAL SALPINGECTOMY;  Surgeon: Oliver Pila, MD;  Location: [facility];  Service: Obstetrics;;  . Reposition gastric band  2006    x2  . Wisdom tooth extraction  1999  . Tubal ligation  2014  . Gastric banding port revision N/A 11/14/2012    Procedure: GASTRIC BANDING PORT REVISION;  Surgeon: Atilano Ina, MD;  Location: [facility];  Service: General;  Laterality: N/A;  PORT PLACEMENT MOVED NEW MESH INSERTED   . Cholecystectomy N/A 11/14/2012    Procedure: LAPAROSCOPIC CHOLECYSTECTOMY;  Surgeon: Atilano Ina, MD;  Location: [facility];  Service: General;  Laterality: N/A;   Family History  Problem Relation Age of Onset  . Diabetes      father's family  . Breast cancer Maternal Grandmother   . Hypertension Mother   . Thyroid cancer     History  Substance Use Topics  . Smoking status: Never Smoker   . Smokeless tobacco: Never Used  . Alcohol Use: No   OB History   Grav Para Term Preterm Abortions TAB SAB Ect Mult Living   3 3 2 1  0 0 0 0 1 4     Review of Systems  Constitutional: Negative for fever, chills, diaphoresis and appetite change.  Gastrointestinal: Positive for abdominal pain. Negative for nausea, vomiting, diarrhea and constipation.  Genitourinary: Negative for dysuria, frequency, hematuria and flank pain.  All other systems  reviewed and are negative.    Allergies  Latex  Home Medications   Current Outpatient Rx  Name  Route  Sig  Dispense  Refill  . Ferrous Fumarate 324 MG TABS   Oral   Take 1 tablet by mouth every other day.         . metoCLOPramide (REGLAN) 10 MG tablet   Oral   Take 10 mg by mouth 3 (three) times daily before meals.         Marland Kitchen oxyCODONE-acetaminophen (ROXICET) 5-325 MG per tablet   Oral   Take 1-2 tablets by mouth every 4 (four) hours as needed for pain.   40 tablet   0    BP 142/86  Pulse 77  Temp(Src) 98.1 F (36.7 C) (Oral)  Resp 18  SpO2 98%   Breastfeeding? Yes Physical Exam  Nursing note and vitals reviewed. Constitutional: She is oriented to person, place, and time. She appears well-developed and well-nourished. No distress.  Obese  HENT:  Head: Normocephalic.  Cardiovascular: Normal rate and regular rhythm.   Pulmonary/Chest: Effort normal and breath sounds normal. No respiratory distress. She has no rales.  Abdominal: Soft. There is tenderness. There is no rebound and no guarding.  Laparoscopic surgical scar consistent with recent surgeries. No bleeding or drainage. There is no erythema or induration of the skin. Lap band port palpated and present in the right abdomen. There is diffuse tenderness to deep palpation of the abdomen side greater near the port. No appreciable localized increased warmth to the skin.  Neurological: She is alert and oriented to person, place, and time.  Skin: Skin is warm and dry. No rash noted.  Psychiatric: She has a normal mood and affect. Her behavior is normal.    ED Course  Procedures   DIAGNOSTIC STUDIES: Oxygen Saturation is 98% on room air.    COORDINATION OF CARE:  Nursing notes reviewed. Vital signs reviewed. Initial pt interview and examination performed.   5:38 AM-patient seen and evaluated. She does not appear in any acute distress. Discussed work up plan with pt at bedside, which includes lab testing, KUB and pain treatment. Pt agrees with plan.  Treatment plan initiated: Medications  oxyCODONE-acetaminophen (PERCOCET/ROXICET) 5-325 MG per tablet 2 tablet (not administered)   Initial diagnostic testing ordered.    6:00AM patient discussed in sign out with [employer]. She hey will follow lab and x-ray results.    Imaging Review No results found.     MDM  No diagnosis found.     Angus Seller, PA-C 11/29/12 [phone]

## 2012-11-29 NOTE — Progress Notes (Signed)
Jamie Mccarthy  12-Apr-1975 [id number]  CARE TEAM:  PCP: Nani Gasser, MD  Outpatient Care Team: Patient Care Team: Agapito Games, MD as PCP - General  Inpatient Treatment Team: Treatment Team: Attending Provider: Provider Default, MD; Physician Assistant: Jillyn Ledger, PA-C; Technician: Cammy Brochure, NT; Registered Nurse: Windell Moulding, RN; Student-Medical: Mateo Flow, Student-RN  This patient is a 37 y.o.female who presents today for surgical evaluation at the request of Coral Ceo, [place].   Called concerning patient status post recent cholecystectomy, and repositioning of port 2 days ago.  He's had pain at the port site.  She was having worsening pain.  Occasional cramping.  It concerns her.  She came emergency room.  No fever or white count.  No nausea or vomiting.  Having flatus.  The ED got Xrays & wished for advice.   Port site without any erythema or drainage.  According to them.  Some mild constipation.  Nontoxic.  Has been resting rather comfortably in the emergency department for most of the visit.   Past Medical History  Diagnosis Date  . PONV (postoperative nausea and vomiting)     has had PONV with previous c/s and also lap band surgery 2004  . GERD (gastroesophageal reflux disease)     diet controlled  . Anemia     Has sickle cell trait  . Iron deficiency anemia, unspecified 10/20/2012  . Hypertension   . Gestational diabetes   . Headache(784.0)   . Herpes 2007    hx of  . Seasonal allergies   . H/O vaginal delivery 2010    Past Surgical History  Procedure Laterality Date  . Laparoscopic gastric banding  2005  . Cesarean section  2012  . Cesarean section N/A 07/16/2012    Procedure: REPEAT CESAREAN SECTION;  Surgeon: Oliver Pila, MD;  Location: [facility];  Service: Obstetrics;  Laterality: N/A;  . Bilateral salpingectomy  07/16/2012    Procedure: BILATERAL DISTAL SALPINGECTOMY;  Surgeon: Oliver Pila, MD;  Location: [facility];   Service: Obstetrics;;  . Reposition gastric band  2006    x2  . Wisdom tooth extraction  1999  . Tubal ligation  2014  . Gastric banding port revision N/A 11/14/2012    Procedure: GASTRIC BANDING PORT REVISION;  Surgeon: Atilano Ina, MD;  Location: [facility];  Service: General;  Laterality: N/A;  PORT PLACEMENT MOVED NEW MESH INSERTED   . Cholecystectomy N/A 11/14/2012    Procedure: LAPAROSCOPIC CHOLECYSTECTOMY;  Surgeon: Atilano Ina, MD;  Location: [facility];  Service: General;  Laterality: N/A;    History   Social History  . Marital Status: Married    Spouse Name: N/A    Number of Children: 1  . Years of Education: N/A   Occupational History  . [employer]    Social History Main Topics  . Smoking status: Never Smoker   . Smokeless tobacco: Never Used  . Alcohol Use: No  . Drug Use: No  . Sexual Activity: Yes     Comment: separated, 2 caffeine drinks daily.    Other Topics Concern  . Not on file   Social History Narrative   2 caffeine drinks per day    Family History  Problem Relation Age of Onset  . Diabetes      father's family  . Breast cancer Maternal Grandmother   . Hypertension Mother   . Thyroid cancer      Current Facility-Administered Medications  Medication Dose  Route Frequency Provider Last Rate Last Dose  . oxyCODONE-acetaminophen (PERCOCET/ROXICET) 5-325 MG per tablet 2 tablet  2 tablet Oral Once Jillyn Ledger, PA-C       Current Outpatient Prescriptions  Medication Sig Dispense Refill  . Ferrous Fumarate 324 MG TABS Take 1 tablet by mouth every other day.      . metoCLOPramide (REGLAN) 10 MG tablet Take 10 mg by mouth 3 (three) times daily before meals.      Marland Kitchen oxyCODONE-acetaminophen (ROXICET) 5-325 MG per tablet Take 1-2 tablets by mouth every 4 (four) hours as needed for pain.  40 tablet  0     Allergies  Allergen Reactions  . Latex Itching    Pt states she gets severely itchy with latex    ROS: Constitutional:  No fevers, chills,  sweats.  Weight stable Eyes:  No vision changes, No discharge HENT:  No sore throats, nasal drainage Lymph: No neck swelling, No bruising easily Pulmonary:  No cough, productive sputum CV: No orthopnea, PND  Patient walks 20 minutes for about 1 miles without difficulty.  No exertional chest/neck/shoulder/arm pain. GI: No personal nor family history of GI/colon cancer, inflammatory bowel disease, irritable bowel syndrome, allergy such as Celiac Sprue, dietary/dairy problems, colitis, ulcers nor gastritis.  No recent sick contacts/gastroenteritis.  No travel outside the country.  No changes in diet. Renal: No UTIs, No hematuria Genital:  No drainage, bleeding, masses Musculoskeletal: No severe joint pain.  Good ROM major joints Skin:  No sores or lesions.  No rashes Heme/Lymph:  No easy bleeding.  No swollen lymph nodes Neuro: No focal weakness/numbness.  No seizures Psych: No suicidal ideation.  No hallucinations  BP 129/82  Pulse 67  Temp(Src) 98.3 F (36.8 C) (Oral)  Resp 14  SpO2 98%  Breastfeeding? Yes     Results:   Labs: Results for orders placed during the hospital encounter of 11/29/12 (from the past 48 hour(s))  CBC WITH DIFFERENTIAL     Status: Abnormal   Collection Time    11/29/12  6:23 AM      Result Value Range   WBC 9.8  4.0 - 10.5 K/uL   RBC 4.80  3.87 - 5.11 MIL/uL   Hemoglobin 11.7 (*) 12.0 - 15.0 g/dL   HCT 19.1 (*) 47.8 - 29.5 %   MCV 73.1 (*) 78.0 - 100.0 fL   MCH 24.4 (*) 26.0 - 34.0 pg   MCHC 33.3  30.0 - 36.0 g/dL   RDW 62.1 (*) 30.8 - 65.7 %   Platelets 341  150 - 400 K/uL   Neutrophils Relative % 47  43 - 77 %   Neutro Abs 4.6  1.7 - 7.7 K/uL   Lymphocytes Relative 36  12 - 46 %   Lymphs Abs 3.6  0.7 - 4.0 K/uL   Monocytes Relative 5  3 - 12 %   Monocytes Absolute 0.5  0.1 - 1.0 K/uL   Eosinophils Relative 11 (*) 0 - 5 %   Eosinophils Absolute 1.1 (*) 0.0 - 0.7 K/uL   Basophils Relative 0  0 - 1 %   Basophils Absolute 0.0  0.0 - 0.1 K/uL   COMPREHENSIVE METABOLIC PANEL     Status: Abnormal   Collection Time    11/29/12  6:23 AM      Result Value Range   Sodium 137  135 - 145 mEq/L   Potassium 4.0  3.5 - 5.1 mEq/L   Chloride 101  96 - 112 mEq/L  CO2 27  19 - 32 mEq/L   Glucose, Bld 121 (*) 70 - 99 mg/dL   BUN 11  6 - 23 mg/dL   Creatinine, Ser 4.78  0.50 - 1.10 mg/dL   Calcium 9.5  8.4 - 29.5 mg/dL   Total Protein 6.9  6.0 - 8.3 g/dL   Albumin 3.3 (*) 3.5 - 5.2 g/dL   AST 15  0 - 37 U/L   ALT 11  0 - 35 U/L   Alkaline Phosphatase 117  39 - 117 U/L   Total Bilirubin 0.1 (*) 0.3 - 1.2 mg/dL   GFR calc non Af Amer >90  >90 mL/min   GFR calc Af Amer >90  >90 mL/min   Comment: (NOTE)     The eGFR has been calculated using the CKD EPI equation.     This calculation has not been validated in all clinical situations.     eGFR's persistently <90 mL/min signify possible Chronic Kidney     Disease.  LIPASE, BLOOD     Status: None   Collection Time    11/29/12  6:23 AM      Result Value Range   Lipase 48  11 - 59 U/L  URINALYSIS, ROUTINE W REFLEX MICROSCOPIC     Status: Abnormal   Collection Time    11/29/12  6:24 AM      Result Value Range   Color, Urine YELLOW  YELLOW   APPearance CLOUDY (*) CLEAR   Specific Gravity, Urine 1.015  1.005 - 1.030   pH 6.0  5.0 - 8.0   Glucose, UA NEGATIVE  NEGATIVE mg/dL   Hgb urine dipstick NEGATIVE  NEGATIVE   Bilirubin Urine NEGATIVE  NEGATIVE   Ketones, ur NEGATIVE  NEGATIVE mg/dL   Protein, ur NEGATIVE  NEGATIVE mg/dL   Urobilinogen, UA 0.2  0.0 - 1.0 mg/dL   Nitrite NEGATIVE  NEGATIVE   Leukocytes, UA MODERATE (*) NEGATIVE  URINE MICROSCOPIC-ADD ON     Status: Abnormal   Collection Time    11/29/12  6:24 AM      Result Value Range   Squamous Epithelial / LPF MANY (*) RARE   WBC, UA 3-6  <3 WBC/hpf   Bacteria, UA FEW (*) RARE  POCT PREGNANCY, URINE     Status: None   Collection Time    11/29/12  6:30 AM      Result Value Range   Preg Test, Ur NEGATIVE  NEGATIVE    Comment:            THE SENSITIVITY OF THIS     METHODOLOGY IS >24 mIU/mL    Imaging / Studies: Dg Chest 2 View  11/06/2012   CLINICAL DATA:  Preoperative evaluation for laparoscopic cholecystectomy  EXAM: CHEST  2 VIEW  COMPARISON:  08/31/2010  FINDINGS: Upper-normal size of cardiac silhouette.  Mediastinal contours and pulmonary vascularity normal.  Lungs clear.  No pleural effusion or pneumothorax.  No acute osseous findings.  Prior laparoscopic gastric banding.  IMPRESSION: No acute abnormalities.   Electronically Signed   By: Ulyses Southward M.D.   On: 11/06/2012 15:30   Ct Abdomen Pelvis W Contrast  11/29/2012   CLINICAL DATA:  Status post recent cholecystectomy.  EXAM: CT ABDOMEN AND PELVIS WITH CONTRAST  TECHNIQUE: Multidetector CT imaging of the abdomen and pelvis was performed using the standard protocol following bolus administration of intravenous contrast.  CONTRAST:  50mL OMNIPAQUE IOHEXOL 300 MG/ML SOLN, OMNIPAQUE IOHEXOL 300 MG/ML SOLN  COMPARISON:  CT 09/12/2012.  FINDINGS: Lung bases are clear.  No pericardial fluid.  No focal hepatic lesion. There is fluid within the gallbladder fossa following cholecystectomy with a tiny locule of gas or fat. There is no enhancement of this fluid collection. Cholecystectomy clips noted. No intrahepatic biliary duct dilatation. The common bile duct is normal caliber. The pancreas, spleen, adrenal glands are normal. Kidneys enhance symmetrically without evidence obstruction.  There is a gastric banding device at the gastric cardia. The tubing is continuous with the ACCESS port in the right anterior abdominal subcutaneous tissue. Prior site identified more medial in the right abdominal wall. There is no evidence of abscess or infection associated with either site  The small bowel, appendix, cecum are normal. No evidence of bowel obstruction. The colon and rectosigmoid colon are normal. There is a calcified leiomyoma within the uterus. No pelvic  lymphadenopathy. Insert then  IMPRESSION: 1. Fluid in the gallbladder fossa is likely sterile fluid from recent cholecystectomy. Cannot exclude a small biloma but this is not favored.  2.  No evidence of abscess in the abdomen or pelvis.  3. No evidence of complication following repositioning of the gastric banding ACCESS port.   Electronically Signed   By: Genevive Bi M.D.   On: 11/29/2012 09:50   Dg Abd Acute W/chest  11/29/2012   CLINICAL DATA:  Right lower quadrant pain  EXAM: ACUTE ABDOMEN SERIES (ABDOMEN 2 VIEW & CHEST 1 VIEW)  COMPARISON:  Prior radiograph from 11/06/2012 and 10/10/2012  FINDINGS: There is no evidence of dilated bowel loops or free intraperitoneal air. No radiopaque calculi or other significant radiographic abnormality is seen. Gastric lap band again noted. Cholecystectomy clips present right upper quadrant.  Heart size and mediastinal contours are within normal limits. Both lungs are clear.  IMPRESSION: Negative abdominal radiographs.  No acute cardiopulmonary disease.   Electronically Signed   By: Rise Mu M.D.   On: 11/29/2012 06:17    Medications / Allergies: per chart  Antibiotics: Anti-infectives   None      Assessment  Tristin A Weiand  37 y.o. female       Problem List:  Active Problems:   * No active hospital problems. *   Postoperative abdominal pain.  Plan:  X-ray showed laparoscopic band in good position.  I reviewed with Dr. Daphine Deutscher with bariatric surgery.  He agrees.  I recommended CAT scan.  They called back.  CAT scan shows no evidence of any bowel obstruction.  Small collection of fluid in the gallbladder fossa.  Most likely postoperative.    I recommended a HIDA scan just to be safe given that she has some discomfort.  Patient feeling better after getting Percocet.  Getting a little more sore four hours later.  I suspect that this is just postoperative issues.  Nothing severe.  If HIDA scan is negative, discharge, her home on  liquids, only.  She is due for postoperative followup soon anyway.  Call our office for followup.     Ardeth Sportsman, M.D., F.A.C.S. Gastrointestinal and Minimally Invasive Surgery [facility] [address], [address] [place], [place] [address] [phone] Main / Paging   11/29/2012

## 2012-11-29 NOTE — ED Notes (Signed)
Patient transported to CT 

## 2012-11-29 NOTE — ED Provider Notes (Signed)
6:00 AM = Received sign out from [employer].  Await results of labs and imaging.      Rechecks  8:15 AM = Patient states her abdominal pain has improved after percocet.  She is resting comfortably.  Diffuse tenderness to palpation.  She has focal RUQ pain surrounding her lap-band site.   11:35 AM = Pain gradually returning.  Will order 2 tablets Percocet, which worked for her previously 6 hours ago.   4:23 PM = Patient symptomatic from UTI with urinary frequency and burning.  Will tx with Keflex.  States gets bad yeast infections with antibiotics.  Will discharge with diflucan.     Results   CT Abdomen Pelvis W Contrast (Final result)  Result time: 11/29/12 09:50:08    Final result by Rad Results In Interface (11/29/12 09:50:08)    Narrative:   CLINICAL DATA: Status post recent cholecystectomy.  EXAM: CT ABDOMEN AND PELVIS WITH CONTRAST  TECHNIQUE: Multidetector CT imaging of the abdomen and pelvis was performed using the standard protocol following bolus administration of intravenous contrast.  CONTRAST: 50mL OMNIPAQUE IOHEXOL 300 MG/ML SOLN, OMNIPAQUE IOHEXOL 300 MG/ML SOLN  COMPARISON: CT 09/12/2012.  FINDINGS: Lung bases are clear. No pericardial fluid.  No focal hepatic lesion. There is fluid within the gallbladder fossa following cholecystectomy with a tiny locule of gas or fat. There is no enhancement of this fluid collection. Cholecystectomy clips noted. No intrahepatic biliary duct dilatation. The common bile duct is normal caliber. The pancreas, spleen, adrenal glands are normal. Kidneys enhance symmetrically without evidence obstruction.  There is a gastric banding device at the gastric cardia. The tubing is continuous with the ACCESS port in the right anterior abdominal subcutaneous tissue. Prior site identified more medial in the right abdominal wall. There is no evidence of abscess or infection associated with either site  The small bowel,  appendix, cecum are normal. No evidence of bowel obstruction. The colon and rectosigmoid colon are normal. There is a calcified leiomyoma within the uterus. No pelvic lymphadenopathy. Insert then  IMPRESSION: 1. Fluid in the gallbladder fossa is likely sterile fluid from recent cholecystectomy. Cannot exclude a small biloma but this is not favored.  2. No evidence of abscess in the abdomen or pelvis.  3. No evidence of complication following repositioning of the gastric banding ACCESS port.   Electronically Signed By: Genevive Bi M.D. On: 11/29/2012 09:50      DG Abd Acute W/Chest (Final result)  Result time: 11/29/12 06:17:34    Final result by Rad Results In Interface (11/29/12 06:17:34)    Narrative:   CLINICAL DATA: Right lower quadrant pain  EXAM: ACUTE ABDOMEN SERIES (ABDOMEN 2 VIEW & CHEST 1 VIEW)  COMPARISON: Prior radiograph from 11/06/2012 and 10/10/2012  FINDINGS: There is no evidence of dilated bowel loops or free intraperitoneal air. No radiopaque calculi or other significant radiographic abnormality is seen. Gastric lap band again noted. Cholecystectomy clips present right upper quadrant.  Heart size and mediastinal contours are within normal limits. Both lungs are clear.  IMPRESSION: Negative abdominal radiographs. No acute cardiopulmonary disease.   Electronically Signed By: Rise Mu M.D. On: 11/29/2012 06:17             NM Hepatobiliary Liver Func (Final result)  Result time: 11/29/12 15:57:14    Final result by Rad Results In Interface (11/29/12 15:57:14)    Narrative:   CLINICAL DATA: Abdominal pain, status post cholecystectomy on 11/14/2012, evaluate for bile leak  EXAM: NUCLEAR MEDICINE HEPATOBILIARY IMAGING  TECHNIQUE: Sequential images of the abdomen were obtained out to 60 minutes following intravenous administration of radiopharmaceutical.  COMPARISON: CT abdomen pelvis dated 11/29/2012 at 0930  hr  RADIOPHARMACEUTICALS: 5.0 mCi Tc-69m Choletec  FINDINGS: Imaging was performed for 120 min.  Normal hepatic excretion.  Contrast opacifies small bowel within 15 min.  Free pertechnetate within the stomach at 45 min.  A small amount of radiotracer activity is present inferior to the gallbladder fossa on static images during the second hour, but this appears to slightly move on the dynamic images and decreases with time, and is therefore favored to be within proximal small bowel.  No evidence of extraluminal radiotracer to suggest a bile leak.  IMPRESSION: No bile leak is visualized.   Electronically Signed By: Charline Bills M.D. On: 11/29/2012 15:57            Results for orders placed during the hospital encounter of 11/29/12  CBC WITH DIFFERENTIAL      Result Value Range   WBC 9.8  4.0 - 10.5 K/uL   RBC 4.80  3.87 - 5.11 MIL/uL   Hemoglobin 11.7 (*) 12.0 - 15.0 g/dL   HCT 29.5 (*) 28.4 - 13.2 %   MCV 73.1 (*) 78.0 - 100.0 fL   MCH 24.4 (*) 26.0 - 34.0 pg   MCHC 33.3  30.0 - 36.0 g/dL   RDW 44.0 (*) 10.2 - 72.5 %   Platelets 341  150 - 400 K/uL   Neutrophils Relative % 47  43 - 77 %   Neutro Abs 4.6  1.7 - 7.7 K/uL   Lymphocytes Relative 36  12 - 46 %   Lymphs Abs 3.6  0.7 - 4.0 K/uL   Monocytes Relative 5  3 - 12 %   Monocytes Absolute 0.5  0.1 - 1.0 K/uL   Eosinophils Relative 11 (*) 0 - 5 %   Eosinophils Absolute 1.1 (*) 0.0 - 0.7 K/uL   Basophils Relative 0  0 - 1 %   Basophils Absolute 0.0  0.0 - 0.1 K/uL  COMPREHENSIVE METABOLIC PANEL      Result Value Range   Sodium 137  135 - 145 mEq/L   Potassium 4.0  3.5 - 5.1 mEq/L   Chloride 101  96 - 112 mEq/L   CO2 27  19 - 32 mEq/L   Glucose, Bld 121 (*) 70 - 99 mg/dL   BUN 11  6 - 23 mg/dL   Creatinine, Ser 3.66  0.50 - 1.10 mg/dL   Calcium 9.5  8.4 - 44.0 mg/dL   Total Protein 6.9  6.0 - 8.3 g/dL   Albumin 3.3 (*) 3.5 - 5.2 g/dL   AST 15  0 - 37 U/L   ALT 11  0 - 35 U/L   Alkaline  Phosphatase 117  39 - 117 U/L   Total Bilirubin 0.1 (*) 0.3 - 1.2 mg/dL   GFR calc non Af Amer >90  >90 mL/min   GFR calc Af Amer >90  >90 mL/min  LIPASE, BLOOD      Result Value Range   Lipase 48  11 - 59 U/L  URINALYSIS, ROUTINE W REFLEX MICROSCOPIC      Result Value Range   Color, Urine YELLOW  YELLOW   APPearance CLOUDY (*) CLEAR   Specific Gravity, Urine 1.015  1.005 - 1.030   pH 6.0  5.0 - 8.0   Glucose, UA NEGATIVE  NEGATIVE mg/dL   Hgb urine dipstick NEGATIVE  NEGATIVE  Bilirubin Urine NEGATIVE  NEGATIVE   Ketones, ur NEGATIVE  NEGATIVE mg/dL   Protein, ur NEGATIVE  NEGATIVE mg/dL   Urobilinogen, UA 0.2  0.0 - 1.0 mg/dL   Nitrite NEGATIVE  NEGATIVE   Leukocytes, UA MODERATE (*) NEGATIVE  URINE MICROSCOPIC-ADD ON      Result Value Range   Squamous Epithelial / LPF MANY (*) RARE   WBC, UA 3-6  <3 WBC/hpf   Bacteria, UA FEW (*) RARE  POCT PREGNANCY, URINE      Result Value Range   Preg Test, Ur NEGATIVE  NEGATIVE   Filed Vitals:   11/29/12 0255 11/29/12 0739 11/29/12 1059  BP: 142/86 123/80 129/82  Pulse: 77 63 67  Temp: 98.1 F (36.7 C) 99 F (37.2 C) 98.3 F (36.8 C)  TempSrc: Oral Oral Oral  Resp: 18 18 14   SpO2: 98% 97% 98%     Consults  8:30 AM = Spoke with Dr. Michaell Cowing who would like the patient to get a CT abdomen and pelvis to further evaluate.  If scan is normal OP follow-up is adequate.   10:47 AM = Spoke with Dr. Gordy Savers RN.  He will look at the scan and call me back.   11:45 AM = Spoke with Dr. Michaell Cowing who recommends a HIDA scan.     Etiology of abdominal pain likely post-op pain vs. UTI.  Patient had a CT of the abdomen and pelvis which was suspicious for a small biloma, which was followed by a negative HIDA scan for biliary leakage.  Patient had focal tenderness to palpation to her lap-band site.  Her pain was well controlled throughout her ED visit.  She was given a prescription for Percocet for pain control.  She was found to have a possible UTI.   Spoke with patient who states she has had increased frequency with urination with dysuria.  Patient will be started on keflex and urine sent for culture.  Patient given prescription for diflucan for yeast infection, which the patient states she always develops with antibiotics.  No leukocytosis and patient afebrile.  Patient instructed to follow-up with Dr. Andrey Campanile and eat a liquid diet.  Return precautions were discussed.     Final impressions: 1. Post-op pain  2. UTI  3. Abdominal pain      Luiz Iron PA-C   This patient was discussed with Dr. Geralyn Flash, PA-C 11/29/12 2157

## 2012-11-29 NOTE — ED Notes (Signed)
Patient is alert and oriented x3.  She is complaining of RUQ pain that has been progressively been getting worse over the last 2 weeks post gallbladder surgery.  She states that her RUQ incision where her port was placed has become inflamed and tender.  She adds that she is also having mid abdominal pain that radiates to her RUQ incision.  Incision have no drainage

## 2012-11-30 LAB — URINE CULTURE

## 2012-11-30 NOTE — ED Provider Notes (Signed)
Medical screening examination/treatment/procedure(s) were conducted as a shared visit with non-physician practitioner(s) and myself.  I personally evaluated the patient during the encounter.  Patient with complex recent history secondary to lap-band procedure and cholecystectomy 2 weeks ago. Was recovering well until 2-3 days ago when she started having RUQ pain.   Abdominal Exam - RUQ tenderness, no guarding, no murphy's sign. Minimal epigastric and LUQ tenderness.  CT scan with fluid in gallbladder fossa - surgery recommended Hida which showed no leak. D/C to follow up with surgery.  EKG Interpretation   None         Gilda Crease, MD 11/30/12 [phone]

## 2012-11-30 NOTE — ED Provider Notes (Signed)
Medical screening examination/treatment/procedure(s) were performed by non-physician practitioner and as supervising physician I was immediately available for consultation/collaboration.   Marry Kusch, MD 11/30/12 0706 

## 2012-12-02 ENCOUNTER — Telehealth (INDEPENDENT_AMBULATORY_CARE_PROVIDER_SITE_OTHER): Payer: Self-pay | Admitting: General Surgery

## 2012-12-02 NOTE — Telephone Encounter (Signed)
Left message on machine for patient to call back and ask for me or triage. Calling to check on patient after she was seen in the ER for abdominal pain. Calling to see if things are better or worse. Per Dr Andrey Campanile patient may need to be seen and given a "band vacation" because she does have fluid in her lap band. Awaiting call back.

## 2012-12-02 NOTE — Telephone Encounter (Signed)
Patient called back - she states pain is the same since she was seen in the ER. It is in the RUQ above port. She states she also has a sharp, burning pain next to umbilicus but she was diagnosed with a UTI and started medication for this on Sunday. She states she does have some redness on skin at port site. She did have a rash but this is going away. She is having no problems getting food down and actually mentions wanting a band fill sooner than 4 weeks. She is having no nausea or vomiting, no fevers. She is moving her bowels daily. Please advise.

## 2012-12-02 NOTE — Telephone Encounter (Signed)
Spoke with Dr Andrey Campanile who advised this sounds okay - keep appt with him next week. We could offer urgent office appt to evaluate redness at [facility] site. I called patient and offered appt. She would like to keep appt with Dr Andrey Campanile next week and she will call with any increase in symptoms. She thinks the redness is just bruising and left over redness from rash.

## 2012-12-11 ENCOUNTER — Ambulatory Visit (INDEPENDENT_AMBULATORY_CARE_PROVIDER_SITE_OTHER): Payer: 59 | Admitting: General Surgery

## 2012-12-11 ENCOUNTER — Encounter (INDEPENDENT_AMBULATORY_CARE_PROVIDER_SITE_OTHER): Payer: Self-pay | Admitting: General Surgery

## 2012-12-11 VITALS — BP 138/100 | HR 88 | Resp 16 | Ht 64.0 in | Wt 242.6 lb

## 2012-12-11 DIAGNOSIS — Z09 Encounter for follow-up examination after completed treatment for conditions other than malignant neoplasm: Secondary | ICD-10-CM

## 2012-12-11 DIAGNOSIS — Z9884 Bariatric surgery status: Secondary | ICD-10-CM

## 2012-12-11 NOTE — Progress Notes (Signed)
Subjective:     Patient ID: Jamie Mccarthy, female   DOB: 1975-05-16, 37 y.o.   MRN: [id number]  HPI 37 year old morbidly obese Caucasian female comes in for followup after undergoing laparoscopic cholecystectomy as well as revision of her laparoscopic adjustable gastric band port on October 24. Unfortunately she developed right-sided abdominal pain and discomfort which prompted her to go to the emergency room on November 8. She had an extensive workup including labs which were normal, a CT scan of her abdomen and pelvis, plain films of her abdomen, as well as a HIDA scan all of which were normal. There is no evidence of bile leak on the nuclear medicine scan. There is no evidence of intra-abdominal or subcutaneous infection. The band appeared to be in good position. She was diagnosed with a UTI and placed on antibiotics. She states that the right-sided abdominal discomfort has essentially resolved. She denies any fevers or chills. She denies any nausea or vomiting. She denies any heartburn or indigestion. She states that she can eat large portions of food. She does not have any restriction. She denies any nighttime cough.  Review of Systems     Objective:   Physical Exam BP 138/100  Pulse 88  Resp 16  Ht 5\' 4"  (1.626 m)  Wt 242 lb 9.6 oz (110.043 kg)  BMI 41.62 kg/m2  See LapBand flowsheet  Gen: alert, NAD, non-toxic appearing HEENT: normocephalic, atraumatic; pupils equal, no scleral icterus, neck supple, no lymphadenopathy Pulm: Lungs clear to auscultation, symmetric chest rise CV: regular rate and rhythm Abd: soft, nontender, nondistended. Well-healed trocar sites. No incisional hernia. Port is in right mid-abdomen. No cellulitis Ext: no edema, normal,  Neuro: nonfocal,  Psych: appropriate, judgment normal     Assessment:     Status post laparoscopic cholecystectomy & revision of her LAP-BAND port  Morbid obesity    Plan:     We reviewed her pathology report which showed  chronic cholecystitis. I not sure what caused all of her abdominal discomfort. In looking at her CT scan there was maybe some mild soft tissue inflammation around her port but definitely no abscess. There is no sign of infection on physical exam today. She has gained approximately 20 pounds since I saw her on October 9. I did recommend an adjustment. She had 4 cc of fluid in her band at the time of her surgery a few weeks ago.  After obtaining verbal consent, the abdominal wall was prepped with Chloraprep. The port was accessed with a Huber needle and 1 cc of saline was added to give the patient an expected fill volume of 5 cc.  The patient was able to tolerate sips of water.   She was reminded to stay on a liquid diet for the next 24 hours. We discussed proper eating techniques and behaviors. We discussed the importance of regular activity. Followup 4 weeks  Mary Sella. Andrey Campanile, MD, FACS General, Bariatric, & Minimally Invasive Surgery [facility], [place]

## 2012-12-11 NOTE — Patient Instructions (Signed)
1. Stay on liquids for the next 2 days as you adapt to your new fill volume.  Then resume your previous diet. 2. Decreasing your carbohydrate intake will hasten you weight loss.  Rely more on proteins for your meals.  Avoid condiments that contain sweets such as Honey Mustard and sugary salad dressings.   3. Stay in the "green zone".  If you are regurgitating with meals, having night time reflux, and find yourself eating soft comfort foods (mashed potatoes, potato chips)...realize that you are developing "maladaptive eating".  You will not lose weight this way and may regain weight.  The GREEN ZONE is eating smaller portions and not regurgitating.  Hence we may need to withdraw fluid from your band. 4. Build exercise into your daily routine.  Walking is the best way to start but do something every day if you can.    Eating techniques 20-20-20 (30-30-30) 20 chews, 20 seconds between bites of food, 20 minutes to eat; sometimes you may need 30 chews, 30 seconds etc Use your nondominant hand to eat with Use a child/infant size utensil Try not to eat while watching TV  

## 2012-12-31 ENCOUNTER — Telehealth: Payer: Self-pay | Admitting: Hematology and Oncology

## 2012-12-31 NOTE — Telephone Encounter (Signed)
pt called inquiring of earlier appts for 12/18 but no availability per Dr Simonne Come' calendar She may call again to see if any cancellations before the 18th shh

## 2013-01-08 ENCOUNTER — Other Ambulatory Visit: Payer: 59

## 2013-01-08 ENCOUNTER — Ambulatory Visit: Payer: 59 | Admitting: Hematology and Oncology

## 2013-01-08 ENCOUNTER — Encounter (INDEPENDENT_AMBULATORY_CARE_PROVIDER_SITE_OTHER): Payer: 59 | Admitting: General Surgery

## 2013-01-09 ENCOUNTER — Encounter: Payer: Self-pay | Admitting: *Deleted

## 2013-01-29 ENCOUNTER — Ambulatory Visit (INDEPENDENT_AMBULATORY_CARE_PROVIDER_SITE_OTHER): Payer: 59 | Admitting: Physician Assistant

## 2013-01-29 ENCOUNTER — Ambulatory Visit (INDEPENDENT_AMBULATORY_CARE_PROVIDER_SITE_OTHER): Payer: 59 | Admitting: Family Medicine

## 2013-01-29 ENCOUNTER — Encounter: Payer: Self-pay | Admitting: Family Medicine

## 2013-01-29 ENCOUNTER — Encounter (INDEPENDENT_AMBULATORY_CARE_PROVIDER_SITE_OTHER): Payer: Self-pay

## 2013-01-29 VITALS — BP 122/84 | HR 72 | Temp 98.5°F | Resp 14 | Ht 64.0 in | Wt 251.6 lb

## 2013-01-29 VITALS — BP 134/84 | HR 85 | Temp 98.0°F | Ht 64.0 in | Wt 251.0 lb

## 2013-01-29 DIAGNOSIS — Z4651 Encounter for fitting and adjustment of gastric lap band: Secondary | ICD-10-CM

## 2013-01-29 DIAGNOSIS — Z9884 Bariatric surgery status: Secondary | ICD-10-CM

## 2013-01-29 DIAGNOSIS — R635 Abnormal weight gain: Secondary | ICD-10-CM

## 2013-01-29 DIAGNOSIS — J320 Chronic maxillary sinusitis: Secondary | ICD-10-CM

## 2013-01-29 MED ORDER — LEVOFLOXACIN 500 MG PO TABS: 500.0000 mg | ORAL_TABLET | Freq: Every day | ORAL | Status: AC

## 2013-01-29 MED ORDER — FLUCONAZOLE 150 MG PO TABS: 150.0000 mg | ORAL_TABLET | Freq: Once | ORAL | Status: DC

## 2013-01-29 MED ORDER — PHENTERMINE HCL 37.5 MG PO CAPS: 37.5000 mg | ORAL_CAPSULE | ORAL | Status: DC

## 2013-01-29 NOTE — Patient Instructions (Signed)

## 2013-01-29 NOTE — Progress Notes (Signed)
  HISTORY: Jamie Mccarthy is a 38 y.o.female who received an VG lap-band in 2004 at [facility] She recently underwent lap chole and band port revision in late October by Dr. Redmond Pulling. Her fill volume at that time was 4 mL, up to 5 during her post op visit. She still complains of excessive weight gain and hunger. We have been thus far unsuccessful at obtaining her records from [place] so the type band she has is a best guess at this juncture, and I discussed that with her. Given her fill volumes and restriction behavior, it looks like she has a VG. She has no regurgitation or reflux symptoms.  VITAL SIGNS: Filed Vitals:   01/29/13 1422  BP: 122/84  Pulse: 72  Temp: 98.5 F (36.9 C)  Resp: 14    PHYSICAL EXAM: Physical exam reveals a very well-appearing 38 y.o.female in no apparent distress Neurologic: Awake, alert, oriented Psych: Bright affect, conversant Respiratory: Breathing even and unlabored. No stridor or wheezing Abdomen: Soft, nontender, nondistended to palpation. Incisions well-healed. No incisional hernias. Port easily palpated. Extremities: Atraumatic, good range of motion.  ASSESMENT: 38 y.o.  female  s/p VG lap-band.   PLAN: The patient's port was accessed with a 20G Huber needle without difficulty. Clear fluid was aspirated and 1.5 mL saline was added to the port to give a total predicted volume of 6.5 mL after verification of 5 mL being in the band. The patient was able to swallow water without difficulty following the procedure and was instructed to take clear liquids for the next 24-48 hours and advance slowly as tolerated.

## 2013-01-29 NOTE — Progress Notes (Signed)
   Subjective:    Patient ID: Jamie Mccarthy, female    DOB: 1975/05/12, 38 y.o.   MRN: [id number]  HPI Sinus sxs- [facility] about 2 weeks ago for sinus sxs.  Has facial pain and toothache on the right side.  Still feels congested. Still coughing.  Wants to make sure no bronchitis. Some chills. Facial pain is throbbing when bends over.  No cold medication.  Some ear pressure on the right.   Had her gallbladder removed in October. They adjusted her gastric port at that time. It took a lot of fluid out of it. She was having some significant nausea and vomiting around that time. They have been slowly refilling it but since then she's actually gained 40 pounds. She says she has been stressed and has been eating more. She would like to have something to help her lose weight until they're able to completely fill the port. They are only doing a small amount each time.  Review of Systems     Objective:   Physical Exam  Constitutional: She is oriented to person, place, and time. She appears well-developed and well-nourished.  HENT:  Head: Normocephalic and atraumatic.  Right Ear: External ear normal.  Left Ear: External ear normal.  Nose: Nose normal.  Mouth/Throat: Oropharyngeal exudate present.  TMs and canals are clear. Tender over right maxillary sinus.  Turbinates in right nose very swollen with thick white drianage.   Eyes: Conjunctivae and EOM are normal. Pupils are equal, round, and reactive to light.  Neck: Neck supple. No thyromegaly present.  Cardiovascular: Normal rate, regular rhythm and normal heart sounds.   Pulmonary/Chest: Effort normal and breath sounds normal. She has no wheezes.  Lymphadenopathy:    She has no cervical adenopathy.  Neurological: She is alert and oriented to person, place, and time.  Skin: Skin is warm and dry.  Psychiatric: She has a normal mood and affect.          Assessment & Plan:  Acute right maxillary sinusitis - will treat with  Levaquin 500 milligrams. Sounds like she did complete a course of azithromycin and did not fill it all better. If she's not better in one week then please call me back and let us know. She had a fair amount of swelling in the right nostril and was tender over the right maxillary sinus.  Abnormal weight gain-as the continue to adjust her poor we could certainly consider a retrial of phentermine. She's used in the past without any problems or side effects. She does not currently have any heart problems. She will stop the medication immediately if she experienced any chest pain, short of breath or palpitations. Otherwise followup in one month for nurse blood pressure and weight check. Spinous she will need to lose weight on it consistently to refill the medication. Hopefully after a few months they're able to completely fill her port again and she will be come off of it.

## 2013-02-05 ENCOUNTER — Encounter (INDEPENDENT_AMBULATORY_CARE_PROVIDER_SITE_OTHER): Payer: 59

## 2013-02-26 ENCOUNTER — Ambulatory Visit (INDEPENDENT_AMBULATORY_CARE_PROVIDER_SITE_OTHER): Payer: 59 | Admitting: Physician Assistant

## 2013-02-26 ENCOUNTER — Encounter (INDEPENDENT_AMBULATORY_CARE_PROVIDER_SITE_OTHER): Payer: 59

## 2013-02-26 ENCOUNTER — Encounter (INDEPENDENT_AMBULATORY_CARE_PROVIDER_SITE_OTHER): Payer: Self-pay

## 2013-02-26 VITALS — BP 124/82 | HR 72 | Temp 98.0°F | Resp 14 | Ht 64.0 in | Wt 244.6 lb

## 2013-02-26 DIAGNOSIS — Z4651 Encounter for fitting and adjustment of gastric lap band: Secondary | ICD-10-CM

## 2013-02-26 NOTE — Progress Notes (Signed)
  HISTORY: Jamie Mccarthy is a 38 y.o.female who received what is believed to be a VG lap-band in 2004 at [facility] with port revision and lap chole by Dr. Redmond Pulling. She comes in 1 month after her last visit with 7 lbs weight loss and 54 lbs weight loss since surgery. She is happy with her progress. She reports improvement in her hunger and portion sizes but it still isn't in the green zone. She had a sub sandwich yesterday which is much more than she's used to eating. She denies regurgitation or reflux symptoms. She is increasing her exercise as well.  VITAL SIGNS: Filed Vitals:   02/26/13 0815  BP: 124/82  Pulse: 72  Temp: 98 F (36.7 C)  Resp: 14    PHYSICAL EXAM: Physical exam reveals a very well-appearing 38 y.o.female in no apparent distress Neurologic: Awake, alert, oriented Psych: Bright affect, conversant Respiratory: Breathing even and unlabored. No stridor or wheezing Abdomen: Soft, nontender, nondistended to palpation. Incisions well-healed. No incisional hernias. Port easily palpated. Extremities: Atraumatic, good range of motion.  ASSESMENT: 38 y.o.  female  s/p VG lap-band.   PLAN: The patient's port was accessed with a 20G Huber needle without difficulty. Clear fluid was aspirated and 0.5 mL saline was added to the port to give a total predicted volume of 7 mL. The patient was able to swallow water without difficulty following the procedure and was instructed to take clear liquids for the next 24-48 hours and advance slowly as tolerated.

## 2013-02-26 NOTE — Patient Instructions (Signed)

## 2013-03-13 ENCOUNTER — Ambulatory Visit (INDEPENDENT_AMBULATORY_CARE_PROVIDER_SITE_OTHER): Payer: 59 | Admitting: Physician Assistant

## 2013-03-13 ENCOUNTER — Encounter: Payer: Self-pay | Admitting: Physician Assistant

## 2013-03-13 VITALS — BP 154/103 | HR 88 | Wt 249.0 lb

## 2013-03-13 DIAGNOSIS — R635 Abnormal weight gain: Secondary | ICD-10-CM

## 2013-03-13 DIAGNOSIS — R300 Dysuria: Secondary | ICD-10-CM

## 2013-03-13 DIAGNOSIS — IMO0001 Reserved for inherently not codable concepts without codable children: Secondary | ICD-10-CM

## 2013-03-13 DIAGNOSIS — N898 Other specified noninflammatory disorders of vagina: Secondary | ICD-10-CM

## 2013-03-13 DIAGNOSIS — R030 Elevated blood-pressure reading, without diagnosis of hypertension: Secondary | ICD-10-CM

## 2013-03-13 DIAGNOSIS — R82998 Other abnormal findings in urine: Secondary | ICD-10-CM

## 2013-03-13 DIAGNOSIS — N899 Noninflammatory disorder of vagina, unspecified: Secondary | ICD-10-CM

## 2013-03-13 LAB — POCT URINALYSIS DIPSTICK
Bilirubin, UA: NEGATIVE
Blood, UA: NEGATIVE
GLUCOSE UA: NEGATIVE
Ketones, UA: NEGATIVE
Nitrite, UA: NEGATIVE
Protein, UA: NEGATIVE
SPEC GRAV UA: 1.015
Urobilinogen, UA: 0.2
pH, UA: 7

## 2013-03-13 LAB — WET PREP FOR TRICH, YEAST, CLUE
Clue Cells Wet Prep HPF POC: NONE SEEN
TRICH WET PREP: NONE SEEN

## 2013-03-13 MED ORDER — FLUCONAZOLE 150 MG PO TABS: 150.0000 mg | ORAL_TABLET | Freq: Once | ORAL | Status: DC

## 2013-03-13 MED ORDER — FERROUS SULFATE 325 (65 FE) MG PO TBEC: 325.0000 mg | DELAYED_RELEASE_TABLET | Freq: Two times a day (BID) | ORAL | Status: DC

## 2013-03-13 MED ORDER — CIPROFLOXACIN HCL 500 MG PO TABS: 500.0000 mg | ORAL_TABLET | Freq: Two times a day (BID) | ORAL | Status: DC

## 2013-03-13 NOTE — Progress Notes (Signed)
   Subjective:    Patient ID: Jamie Mccarthy, female    DOB: June 22, 1975, 38 y.o.   MRN: [id number]  HPI Patient presents to the clinic to followup on medication and with acute concerns.  Hypertension-patient's blood pressure is elevated today however patient reports that when she has checked at home it has not been elevated. She denies any chest pain, palpitations, headaches.  Patient is concerned he started to have some urinary discomfort when urinating. She also describes some vaginal irritation. She has some mild white discharge that seems clumpy. She's concerned she has a yeast infection or a urinary tract infection. She denies any lower, pain or back pain.  Patient has been diagnosed with iron deficiency anemia. She is currently on no treatment. She is finding it hard to make an appointment for iron infusion. She would like something oral for anemia. She feels like she has been more fatigued lately.     Review of Systems     Objective:   Physical Exam  Constitutional: She is oriented to person, place, and time. She appears well-developed and well-nourished.  HENT:  Head: Normocephalic and atraumatic.  Cardiovascular: Normal rate, regular rhythm and normal heart sounds.   Pulmonary/Chest: Effort normal and breath sounds normal.  No CVA tenderness.  Neurological: She is alert and oriented to person, place, and time.  Psychiatric: She has a normal mood and affect. Her behavior is normal.          Assessment & Plan:  Hypertension-patient declines any medication treatment today. She states that when she checks her blood pressure home it is controlled. Will recheck in 2 weeks to make sure blood pressure is decreasing.  Iron deficiency anemia-patient has been unable to get in with hematologist for iron infusions. Currently she is not on any medication. Will start with ferrous sulfate twice a day. Discuss with history of gastro-pain the there could be some absorption issues with  oral iron. I suggest that appointment for IV infusions.  Dysuria-UA was positive for small leukocytes. Since patient was symptomatic I did go ahead and treat with Cipro for 3 days. We'll send for culture. Patient has history of getting yeast infections with antibiotic use sent 2 diflucan.  Vaginal irritation-symptoms seem consistent with yeast infection. Will get wet prep today. Patient already treated for yeast infection since started on antibiotic.  Abnormal weight gain-discontinued phentermine 22 blood pressure elevation. Patient has not been taking phentermine at this point anyways. Discussed other options of weight loss over-the-counter such as contrite and healthy. Wants to get a pressure controlled we can consider these options in addition to diet and exercise.

## 2013-03-15 LAB — URINE CULTURE
Colony Count: NO GROWTH
Organism ID, Bacteria: NO GROWTH

## 2013-03-18 ENCOUNTER — Telehealth (INDEPENDENT_AMBULATORY_CARE_PROVIDER_SITE_OTHER): Payer: Self-pay | Admitting: *Deleted

## 2013-03-18 NOTE — Telephone Encounter (Signed)
Returned call to pt regarding appt 3.5.15.Jamie Mccarthy She wants to be seen earlier in the morning that day, per Jonni Sanger, pt will need to be at office when doors open at 8 and get registered and he can see her the earliest at 8:15... Asked pt to call the office back.Jamie Kitchenjkw

## 2013-03-25 ENCOUNTER — Telehealth (INDEPENDENT_AMBULATORY_CARE_PROVIDER_SITE_OTHER): Payer: Self-pay | Admitting: *Deleted

## 2013-03-25 NOTE — Telephone Encounter (Signed)
Pt has no bariatric insurance coverage on her [facility] insurance.  Angie in billing has been working the account and Caren Griffins brought it to my attention that she has an outstanding balance of $560.  Per Sherle Poe, and billing, pt balance must be paid in full before we can see her anymore.Marland Kitchenjkw

## 2013-03-26 ENCOUNTER — Encounter (INDEPENDENT_AMBULATORY_CARE_PROVIDER_SITE_OTHER): Payer: 59

## 2013-03-26 ENCOUNTER — Encounter (INDEPENDENT_AMBULATORY_CARE_PROVIDER_SITE_OTHER): Payer: Self-pay

## 2013-03-26 ENCOUNTER — Ambulatory Visit (INDEPENDENT_AMBULATORY_CARE_PROVIDER_SITE_OTHER): Payer: 59 | Admitting: Physician Assistant

## 2013-03-26 VITALS — BP 130/86 | HR 84 | Temp 99.0°F | Resp 15 | Ht 64.0 in | Wt 257.2 lb

## 2013-03-26 DIAGNOSIS — Z4651 Encounter for fitting and adjustment of gastric lap band: Secondary | ICD-10-CM

## 2013-03-26 NOTE — Patient Instructions (Signed)

## 2013-03-26 NOTE — Progress Notes (Signed)
  HISTORY: Jamie Mccarthy is a 38 y.o.female who received what is suspected to be a VG lap-band in 2004 at [facility]. She comes in today with 13 lbs weight gain since her last visit. She reports having some psychosocial stressors over the past month which has influenced her eating but she also states that she feels little or no restriction still. She says she can eat an entire salad without any feeling of fullness.  VITAL SIGNS: Filed Vitals:   03/26/13 0822  BP: 130/86  Pulse: 84  Temp: 99 F (37.2 C)  Resp: 15    PHYSICAL EXAM: Physical exam reveals a very well-appearing 38 y.o.female in no apparent distress Neurologic: Awake, alert, oriented Psych: Bright affect, conversant Respiratory: Breathing even and unlabored. No stridor or wheezing Abdomen: Soft, nontender, nondistended to palpation. Incisions well-healed. No incisional hernias. Port easily palpated. Extremities: Atraumatic, good range of motion.  ASSESMENT: 38 y.o.  female  S/p ? VG lap-band.   PLAN: The patient's port was accessed with a 20G Huber needle without difficulty. Clear fluid was aspirated and 2 mL saline was added to the port to give a total predicted volume of 9 mL. This was done as a progressive fill, with the patient taking sips of water as fluid was progressively added. Even at a 2 mL fill she had no problems taking large sips of water. The patient was able to swallow water without difficulty following the procedure and was instructed to take clear liquids for the next 24-48 hours and advance slowly as tolerated.

## 2013-04-01 ENCOUNTER — Encounter: Payer: Self-pay | Admitting: Physician Assistant

## 2013-04-01 ENCOUNTER — Ambulatory Visit (INDEPENDENT_AMBULATORY_CARE_PROVIDER_SITE_OTHER): Payer: 59 | Admitting: Physician Assistant

## 2013-04-01 VITALS — BP 146/91 | HR 74 | Wt 237.0 lb

## 2013-04-01 DIAGNOSIS — R112 Nausea with vomiting, unspecified: Secondary | ICD-10-CM

## 2013-04-01 DIAGNOSIS — Z86018 Personal history of other benign neoplasm: Secondary | ICD-10-CM

## 2013-04-01 DIAGNOSIS — R197 Diarrhea, unspecified: Secondary | ICD-10-CM

## 2013-04-01 DIAGNOSIS — N920 Excessive and frequent menstruation with regular cycle: Secondary | ICD-10-CM

## 2013-04-01 DIAGNOSIS — Z8742 Personal history of other diseases of the female genital tract: Secondary | ICD-10-CM

## 2013-04-01 DIAGNOSIS — D509 Iron deficiency anemia, unspecified: Secondary | ICD-10-CM

## 2013-04-01 LAB — POCT HEMOGLOBIN: Hemoglobin: 12.5 g/dL (ref 12.2–16.2)

## 2013-04-01 MED ORDER — ETONOGESTREL-ETHINYL ESTRADIOL 0.12-0.015 MG/24HR VA RING: VAGINAL_RING | VAGINAL | Status: DC

## 2013-04-01 MED ORDER — PROMETHAZINE HCL 25 MG RE SUPP: 25.0000 mg | Freq: Four times a day (QID) | RECTAL | Status: DC | PRN

## 2013-04-01 NOTE — Progress Notes (Signed)
   Subjective:    Patient ID: Jamie Mccarthy, female    DOB: [birth year], 38 y.o.   MRN: [id number]  HPI Patient is a 38 year old female who presents to the clinic to discuss nausea, vomiting, diarrhea. Symptoms started after an March 5 followup where her lap band was filled with 9 cc of solution. The next morning she woke up with intense nausea, vomiting, diarrhea. Since then she has not been able to eat any solid food and barely drink liquids. She is able to hold down some small amounts of water. She does not vomit unless she tries to eat or drink. She does constantly feel nauseated. She is having 2-3 loose stools a day for the last 2 weeks. She denies any antibiotics recently in the last month. She denies any travel. She drinks city water. She's not had anything to try for nausea control. Via scales show she has lost over 20 pounds since March 5 appointment. She denies any fever, chills or abdominal pain.  Menstrual bleeding- patient is concerned because her periods are very heavy. She has lot of clots during her menstrual cycle. She had a tubal ligation in June 2014 after the birth of her child. She has not had a period until December and they have not been regular since. She had 3 menstrual cycles and January. She never knows when she is going to bleed and is very heavy. constant run very very heavy. Patient does have a history of fibroids. Patient also has a history of anemia and she has not been able to take her iron tablet to 2 nausea vomiting.   Review of Systems     Objective:   Physical Exam  Constitutional: She is oriented to person, place, and time. She appears well-developed and well-nourished.  HENT:  Head: Normocephalic and atraumatic.  Cardiovascular: Normal rate, regular rhythm and normal heart sounds.   Pulmonary/Chest: Effort normal and breath sounds normal.  Abdominal: Soft. Bowel sounds are normal. There is no tenderness.  Neurological: She is alert and oriented to person,  place, and time.  Skin: Skin is warm and dry.  Psychiatric: She has a normal mood and affect. Her behavior is normal.          Assessment & Plan:  Nausea/vomiting/diarrhea-does seem rather apparent that symptoms came after lap band feeling. I suggest patient following up with bariatric surgeon ASAP. I did send her for suppositories or Phenergan to help with nausea. I would like to check a stool culture, ova and parasites, C. difficile to evaluate diarrhea. I would also like to get a CBC to look at white blood count. Patient encouraged to stay hydrated. Discussed warning signs for dehydration. At this point I do not see any warning signs for dehydration. Patient's pulse rate is great and stable. Followup if not improving or symptoms worsening.  Heavy menses/history of fibroids/irregular menstrual cycle-She does have a slightly elevated blood pressure today as well at she cannot swallow any pills at this point. We'll try nuva ring after next period. We'll continue to monitor her blood pressure. I would like to evaluate with a pelvic ultrasound to confirm fibroids or not increasing in size.  History of anemia -hgb 12.5. This is of left side normal but still normal. Reassured patient that as soon she can tolerate should continue taking oral iron. We will continue to work on heavy menstrual periods in hopes that this will increase iron level as well.

## 2013-04-01 NOTE — Patient Instructions (Signed)
Will send over nuva ring.  Sent phenergan suppository.  Continue to stay hydrated.  Get in with bariatric. Will get pelvic ultrasound.  Will check electrolytes and stool culture today.

## 2013-04-02 ENCOUNTER — Encounter (INDEPENDENT_AMBULATORY_CARE_PROVIDER_SITE_OTHER): Payer: 59

## 2013-04-02 ENCOUNTER — Telehealth (INDEPENDENT_AMBULATORY_CARE_PROVIDER_SITE_OTHER): Payer: Self-pay | Admitting: *Deleted

## 2013-04-02 LAB — CBC WITH DIFFERENTIAL/PLATELET
BASOS ABS: 0 10*3/uL (ref 0.0–0.1)
Basophils Relative: 0 % (ref 0–1)
EOS PCT: 4 % (ref 0–5)
Eosinophils Absolute: 0.3 10*3/uL (ref 0.0–0.7)
HCT: 42.4 % (ref 36.0–46.0)
Hemoglobin: 14.2 g/dL (ref 12.0–15.0)
Lymphocytes Relative: 43 % (ref 12–46)
Lymphs Abs: 3.5 10*3/uL (ref 0.7–4.0)
MCH: 25.7 pg — AB (ref 26.0–34.0)
MCHC: 33.5 g/dL (ref 30.0–36.0)
MCV: 76.7 fL — AB (ref 78.0–100.0)
Monocytes Absolute: 0.5 10*3/uL (ref 0.1–1.0)
Monocytes Relative: 6 % (ref 3–12)
Neutro Abs: 3.9 10*3/uL (ref 1.7–7.7)
Neutrophils Relative %: 47 % (ref 43–77)
PLATELETS: 379 10*3/uL (ref 150–400)
RBC: 5.53 MIL/uL — ABNORMAL HIGH (ref 3.87–5.11)
RDW: 16.1 % — AB (ref 11.5–15.5)
WBC: 8.2 10*3/uL (ref 4.0–10.5)

## 2013-04-02 LAB — COMPLETE METABOLIC PANEL WITH GFR
ALT: 15 U/L (ref 0–35)
AST: 17 U/L (ref 0–37)
Albumin: 5 g/dL (ref 3.5–5.2)
Alkaline Phosphatase: 80 U/L (ref 39–117)
BUN: 8 mg/dL (ref 6–23)
CO2: 27 mEq/L (ref 19–32)
CREATININE: 0.76 mg/dL (ref 0.50–1.10)
Calcium: 10 mg/dL (ref 8.4–10.5)
Chloride: 108 mEq/L (ref 96–112)
GFR, Est Non African American: >89 mL/min
Glucose, Bld: 149 mg/dL — ABNORMAL HIGH (ref 70–99)
Potassium: 3.8 mEq/L (ref 3.5–5.3)
Sodium: 145 mEq/L (ref 135–145)
Total Bilirubin: 0.7 mg/dL (ref 0.2–1.2)
Total Protein: 7.6 g/dL (ref 6.0–8.3)

## 2013-04-02 NOTE — Telephone Encounter (Signed)
Patient called in stating that her lap band is a little too tight and she is wanting just a little fluid removed.  She states she cannot miss anymore work or she may be terminated and she wants to know her options.  I explained that she could wait another week as long as she is still able to get her protein shakes down to see if it loosens a little on it's own or she would need to schedule an appt to be seen in [facility].  She will call back if she decides to schedule an appt.  She is agreeable at this time.

## 2013-04-10 ENCOUNTER — Ambulatory Visit: Payer: 59 | Admitting: Family Medicine

## 2013-04-30 ENCOUNTER — Encounter (INDEPENDENT_AMBULATORY_CARE_PROVIDER_SITE_OTHER): Payer: 59

## 2013-05-05 ENCOUNTER — Encounter (INDEPENDENT_AMBULATORY_CARE_PROVIDER_SITE_OTHER): Payer: Self-pay | Admitting: Physician Assistant

## 2013-06-08 ENCOUNTER — Other Ambulatory Visit: Payer: Self-pay | Admitting: Family Medicine

## 2013-07-28 ENCOUNTER — Other Ambulatory Visit: Payer: Self-pay | Admitting: Family Medicine

## 2013-08-13 ENCOUNTER — Encounter (INDEPENDENT_AMBULATORY_CARE_PROVIDER_SITE_OTHER): Payer: 59

## 2013-08-20 ENCOUNTER — Ambulatory Visit (INDEPENDENT_AMBULATORY_CARE_PROVIDER_SITE_OTHER): Payer: 59 | Admitting: Physician Assistant

## 2013-08-20 ENCOUNTER — Encounter (INDEPENDENT_AMBULATORY_CARE_PROVIDER_SITE_OTHER): Payer: Self-pay

## 2013-08-20 VITALS — BP 126/80 | HR 77 | Temp 97.5°F | Ht 64.0 in | Wt 242.0 lb

## 2013-08-20 DIAGNOSIS — Z4651 Encounter for fitting and adjustment of gastric lap band: Secondary | ICD-10-CM

## 2013-08-20 NOTE — Patient Instructions (Signed)

## 2013-08-20 NOTE — Progress Notes (Signed)
  HISTORY: Jamie Mccarthy is a 38 y.o.female who received a lap-band, likely VG band, in 2004 at [place]. The patient has lost 15 lbs since their last visit in March, and has lost 56 lbs since surgery. She comes in today with a 2 month history of progressively increasing hunger and larger than desired portion sizes. She's noticed increasing tolerance of foods that she was previously unable to tolerate like bread and romaine lettuce. She does admit to little formal physical activity, which she plans on increasing. She has no persistent regurgitation or reflux symptoms, but foods like citrus do cause some reflux from time to time.  VITAL SIGNS: Filed Vitals:   08/20/13 0831  BP: 126/80  Pulse: 77  Temp: 97.5 F (36.4 C)    PHYSICAL EXAM: Physical exam reveals a very well-appearing 38 y.o.female in no apparent distress Neurologic: Awake, alert, oriented Psych: Bright affect, conversant Respiratory: Breathing even and unlabored. No stridor or wheezing Abdomen: Soft, nontender, nondistended to palpation. Incisions well-healed. No incisional hernias. Port easily palpated. Extremities: Atraumatic, good range of motion.  ASSESMENT: 38 y.o.  female  s/p VG? lap-band.   PLAN: The patient's port was accessed with a 20G Huber needle without difficulty. Clear fluid was aspirated and 0.5 mL saline was added to the port to give a total predicted volume of 9.5 mL. The patient was able to swallow water without difficulty following the procedure and was instructed to take clear liquids for the next 24-48 hours and advance slowly as tolerated. I encouraged her to increase her physical activity and to be cognizant of food choices. She will contact us for an appointment should she notice decreased restriction.

## 2013-08-27 ENCOUNTER — Encounter (INDEPENDENT_AMBULATORY_CARE_PROVIDER_SITE_OTHER): Payer: 59

## 2013-09-14 ENCOUNTER — Ambulatory Visit: Payer: 59 | Admitting: Physician Assistant

## 2013-09-17 ENCOUNTER — Ambulatory Visit (INDEPENDENT_AMBULATORY_CARE_PROVIDER_SITE_OTHER): Payer: 59 | Admitting: Family Medicine

## 2013-09-17 ENCOUNTER — Encounter: Payer: Self-pay | Admitting: Family Medicine

## 2013-09-17 VITALS — BP 118/81 | HR 85 | Wt 240.0 lb

## 2013-09-17 DIAGNOSIS — G43909 Migraine, unspecified, not intractable, without status migrainosus: Secondary | ICD-10-CM

## 2013-09-17 DIAGNOSIS — D649 Anemia, unspecified: Secondary | ICD-10-CM

## 2013-09-17 DIAGNOSIS — R112 Nausea with vomiting, unspecified: Secondary | ICD-10-CM

## 2013-09-17 MED ORDER — KETOROLAC TROMETHAMINE 60 MG/2ML IM SOLN
60.0000 mg | Freq: Once | INTRAMUSCULAR | Status: AC
Start: 2013-09-17 — End: 2013-09-17
  Administered 2013-09-17: 60 mg via INTRAMUSCULAR

## 2013-09-17 MED ORDER — ONDANSETRON 4 MG PO TBDP: 4.0000 mg | ORAL_TABLET | Freq: Three times a day (TID) | ORAL | Status: DC | PRN

## 2013-09-17 NOTE — Progress Notes (Signed)
CC: Jamie Mccarthy is a 38 y.o. female is here for Migraine   Subjective: HPI:  Complains of a pounding stinging headache that has been present for the past 2 days came on gradually accompanied by nausea. Headache is worse with bright lites. Nausea is worse with eating anything and has resulted in vomiting a few times.  No interventions as of yet for either of the above. Symptoms are moderate in severity. Not interfering with sleep.  Symptoms are persistent throughout the day. Headache is nonradiating and feels like prior issues with migraines but states she's not had a headache like this for a year. Denies fevers, chills, confusion, motor or sensory disturbances, nasal congestion, sinus pressure, sore throat. Review of systems is positive for shortness of breath with walking earlier this morning.  She once to know if she could be going through menopause. Her periods restarted after pregnancy and lactation around December of 2014 and since then they have been unpredictable with bleeding for up to 8 days which is on her menses. Denies chest pain or lightheadedness but does have a history of anemia   Review Of Systems Outlined In HPI  Past Medical History  Diagnosis Date  . PONV (postoperative nausea and vomiting)     has had PONV with previous c/s and also lap band surgery 2004  . GERD (gastroesophageal reflux disease)     diet controlled  . Anemia     Has sickle cell trait  . Iron deficiency anemia, unspecified 10/20/2012  . Hypertension   . Gestational diabetes   . Headache(784.0)   . Herpes 2007    hx of  . Seasonal allergies   . H/O vaginal delivery 2010    Past Surgical History  Procedure Laterality Date  . Laparoscopic gastric banding  2005  . Cesarean section  2012  . Cesarean section N/A 07/16/2012    Procedure: REPEAT CESAREAN SECTION;  Surgeon: Logan Bores, MD;  Location: [facility];  Service: Obstetrics;  Laterality: N/A;  . Bilateral salpingectomy  07/16/2012   Procedure: BILATERAL DISTAL SALPINGECTOMY;  Surgeon: Logan Bores, MD;  Location: [facility];  Service: Obstetrics;;  . Reposition gastric band  2006    x2  . Wisdom tooth extraction  1999  . Tubal ligation  2014  . Gastric banding port revision N/A 11/14/2012    Procedure: GASTRIC BANDING PORT REVISION;  Surgeon: Gayland Curry, MD;  Location: [facility];  Service: General;  Laterality: N/A;  PORT PLACEMENT MOVED NEW MESH INSERTED   . Cholecystectomy N/A 11/14/2012    Procedure: LAPAROSCOPIC CHOLECYSTECTOMY;  Surgeon: Gayland Curry, MD;  Location: [facility];  Service: General;  Laterality: N/A;   Family History  Problem Relation Age of Onset  . Diabetes      father's family  . Breast cancer Maternal Grandmother   . Hypertension Mother   . Thyroid cancer      History   Social History  . Marital Status: Married    Spouse Name: N/A    Number of Children: 1  . Years of Education: N/A   Occupational History  . [employer]    Social History Main Topics  . Smoking status: Never Smoker   . Smokeless tobacco: Never Used  . Alcohol Use: No  . Drug Use: No  . Sexual Activity: Yes     Comment: separated, 2 caffeine drinks daily.    Other Topics Concern  . Not on file   Social History Narrative  2 caffeine drinks per day     Objective: BP 118/81  Pulse 85  Wt 240 lb (108.863 kg)  General: Alert and Oriented, No Acute Distress HEENT: Pupils equal, round, reactive to light. Conjunctivae clear.  External ears unremarkable, canals clear with intact TMs with appropriate landmarks.  Middle ear appears open without effusion. Pink inferior turbinates.  Moist mucous membranes, pharynx without inflammation nor lesions.  Neck supple without palpable lymphadenopathy nor abnormal masses. Lungs: Clear to auscultation bilaterally, no wheezing/ronchi/rales.  Comfortable work of breathing. Good air movement. Cardiac: Regular rate and rhythm. Normal S1/S2.  No murmurs, rubs, nor gallops.   Abdomen:  Obese, Normal bowel sounds, soft and non tender without palpable masses other than the lap band port in the right upper quadrant. Neuro: Cranial nerves II through XII grossly intact  Mental Status: No depression, anxiety, nor agitation. Skin: Warm and dry.  Assessment & Plan: Jamie Mccarthy was seen today for migraine.  Diagnoses and associated orders for this visit:  Anemia, unspecified anemia type - Hemoglobin  Migraine, unspecified, without mention of intractable migraine without mention of status migrainosus - ketorolac (TORADOL) injection 60 mg; Inject 2 mLs (60 mg total) into the muscle once.  Non-intractable vomiting with nausea, vomiting of unspecified type - ondansetron (ZOFRAN ODT) 4 MG disintegrating tablet; Take 1-2 tablets (4-8 mg total) by mouth every 8 (eight) hours as needed for nausea or vomiting.    Anemia: Checking hemoglobin given shortness of breath and recent irregular menses Migraine: Toradol provided here in the clinic, 8 mg of Zofran by mouth was also provided, Zofran prescription to be used at home as needed. Followup with PCP if symptoms persist   Return if symptoms worsen or fail to improve.

## 2013-09-18 ENCOUNTER — Telehealth: Payer: Self-pay

## 2013-09-18 MED ORDER — TOPIRAMATE 50 MG PO TABS: ORAL_TABLET | ORAL | Status: DC

## 2013-09-18 NOTE — Telephone Encounter (Signed)
What is her rescue medication? Imitrex, zomig, etc? Does she have one? Did she have one that worked in the past?

## 2013-09-18 NOTE — Telephone Encounter (Signed)
Jamie Mccarthy was seen by Dr Ileene Rubens yesterday and treated for a migraine. She did get relief from 12 pm to 9 pm. She is now having headaches, nausea, vomiting and chills. Denies vision problems. She has had a history of migraines in the past but it has been years. I schedule patient for earliest appointment, next Friday at 1 pm. Please advise.

## 2013-09-18 NOTE — Telephone Encounter (Signed)
Patient advised.

## 2013-09-18 NOTE — Telephone Encounter (Signed)
She states it was so long ago she really doesn't remember the name of the medication for migraines. She thinks it might be Topamax.

## 2013-09-18 NOTE — Telephone Encounter (Signed)
Ok will send over topamax. Will take it daily. Sent to [facility].

## 2013-09-19 ENCOUNTER — Emergency Department (HOSPITAL_COMMUNITY)
Admission: EM | Admit: 2013-09-19 | Discharge: 2013-09-20 | Disposition: A | Discharge status: Home / Self Care | Payer: 59 | Attending: Emergency Medicine | Admitting: Emergency Medicine

## 2013-09-19 ENCOUNTER — Encounter (HOSPITAL_COMMUNITY): Payer: Self-pay | Admitting: Emergency Medicine

## 2013-09-19 ENCOUNTER — Emergency Department (HOSPITAL_COMMUNITY): Payer: 59

## 2013-09-19 DIAGNOSIS — Z8619 Personal history of other infectious and parasitic diseases: Secondary | ICD-10-CM | POA: Diagnosis not present

## 2013-09-19 DIAGNOSIS — Z862 Personal history of diseases of the blood and blood-forming organs and certain disorders involving the immune mechanism: Secondary | ICD-10-CM | POA: Insufficient documentation

## 2013-09-19 DIAGNOSIS — F419 Anxiety disorder, unspecified: Secondary | ICD-10-CM

## 2013-09-19 DIAGNOSIS — G43909 Migraine, unspecified, not intractable, without status migrainosus: Secondary | ICD-10-CM | POA: Insufficient documentation

## 2013-09-19 DIAGNOSIS — F411 Generalized anxiety disorder: Secondary | ICD-10-CM | POA: Diagnosis not present

## 2013-09-19 DIAGNOSIS — R1013 Epigastric pain: Secondary | ICD-10-CM | POA: Diagnosis not present

## 2013-09-19 DIAGNOSIS — Z8719 Personal history of other diseases of the digestive system: Secondary | ICD-10-CM | POA: Insufficient documentation

## 2013-09-19 DIAGNOSIS — Z3202 Encounter for pregnancy test, result negative: Secondary | ICD-10-CM | POA: Diagnosis not present

## 2013-09-19 DIAGNOSIS — R0789 Other chest pain: Secondary | ICD-10-CM

## 2013-09-19 DIAGNOSIS — R110 Nausea: Secondary | ICD-10-CM | POA: Diagnosis not present

## 2013-09-19 DIAGNOSIS — R209 Unspecified disturbances of skin sensation: Secondary | ICD-10-CM | POA: Insufficient documentation

## 2013-09-19 DIAGNOSIS — I10 Essential (primary) hypertension: Secondary | ICD-10-CM | POA: Diagnosis not present

## 2013-09-19 DIAGNOSIS — Z8632 Personal history of gestational diabetes: Secondary | ICD-10-CM | POA: Insufficient documentation

## 2013-09-19 DIAGNOSIS — R079 Chest pain, unspecified: Secondary | ICD-10-CM | POA: Insufficient documentation

## 2013-09-19 DIAGNOSIS — Z79899 Other long term (current) drug therapy: Secondary | ICD-10-CM | POA: Diagnosis not present

## 2013-09-19 DIAGNOSIS — Z91040 Latex allergy status: Secondary | ICD-10-CM | POA: Diagnosis not present

## 2013-09-19 LAB — CBC
HEMATOCRIT: 37.1 % (ref 36.0–46.0)
Hemoglobin: 12.6 g/dL (ref 12.0–15.0)
MCH: 26 pg (ref 26.0–34.0)
MCHC: 34 g/dL (ref 30.0–36.0)
MCV: 76.5 fL — AB (ref 78.0–100.0)
Platelets: 331 10*3/uL (ref 150–400)
RBC: 4.85 MIL/uL (ref 3.87–5.11)
RDW: 14 % (ref 11.5–15.5)
WBC: 8.5 10*3/uL (ref 4.0–10.5)

## 2013-09-19 LAB — BASIC METABOLIC PANEL
Anion gap: 10 (ref 5–15)
BUN: 8 mg/dL (ref 6–23)
CALCIUM: 9.4 mg/dL (ref 8.4–10.5)
CO2: 27 mEq/L (ref 19–32)
Chloride: 106 mEq/L (ref 96–112)
Creatinine, Ser: 0.8 mg/dL (ref 0.50–1.10)
Glucose, Bld: 85 mg/dL (ref 70–99)
Potassium: 4.2 mEq/L (ref 3.7–5.3)
Sodium: 143 mEq/L (ref 137–147)

## 2013-09-19 LAB — URINE MICROSCOPIC-ADD ON

## 2013-09-19 LAB — URINALYSIS, ROUTINE W REFLEX MICROSCOPIC
BILIRUBIN URINE: NEGATIVE
Glucose, UA: NEGATIVE mg/dL
Ketones, ur: NEGATIVE mg/dL
Leukocytes, UA: NEGATIVE
Nitrite: NEGATIVE
Protein, ur: NEGATIVE mg/dL
Specific Gravity, Urine: 1.01 (ref 1.005–1.030)
Urobilinogen, UA: 0.2 mg/dL (ref 0.0–1.0)
pH: 8.5 — ABNORMAL HIGH (ref 5.0–8.0)

## 2013-09-19 LAB — POC URINE PREG, ED: PREG TEST UR: NEGATIVE

## 2013-09-19 LAB — I-STAT TROPONIN, ED
TROPONIN I, POC: 0 ng/mL (ref 0.00–0.08)
Troponin i, poc: 0 ng/mL (ref 0.00–0.08)

## 2013-09-19 IMAGING — CR DG CHEST 2V
2 series · 2 of 2 positions shown · non-contrast
Comparison: [DATE]

CLINICAL DATA: Upper chest pressure.  Shortness of breath.

EXAM:
CHEST  2 VIEW

[w chest pa]
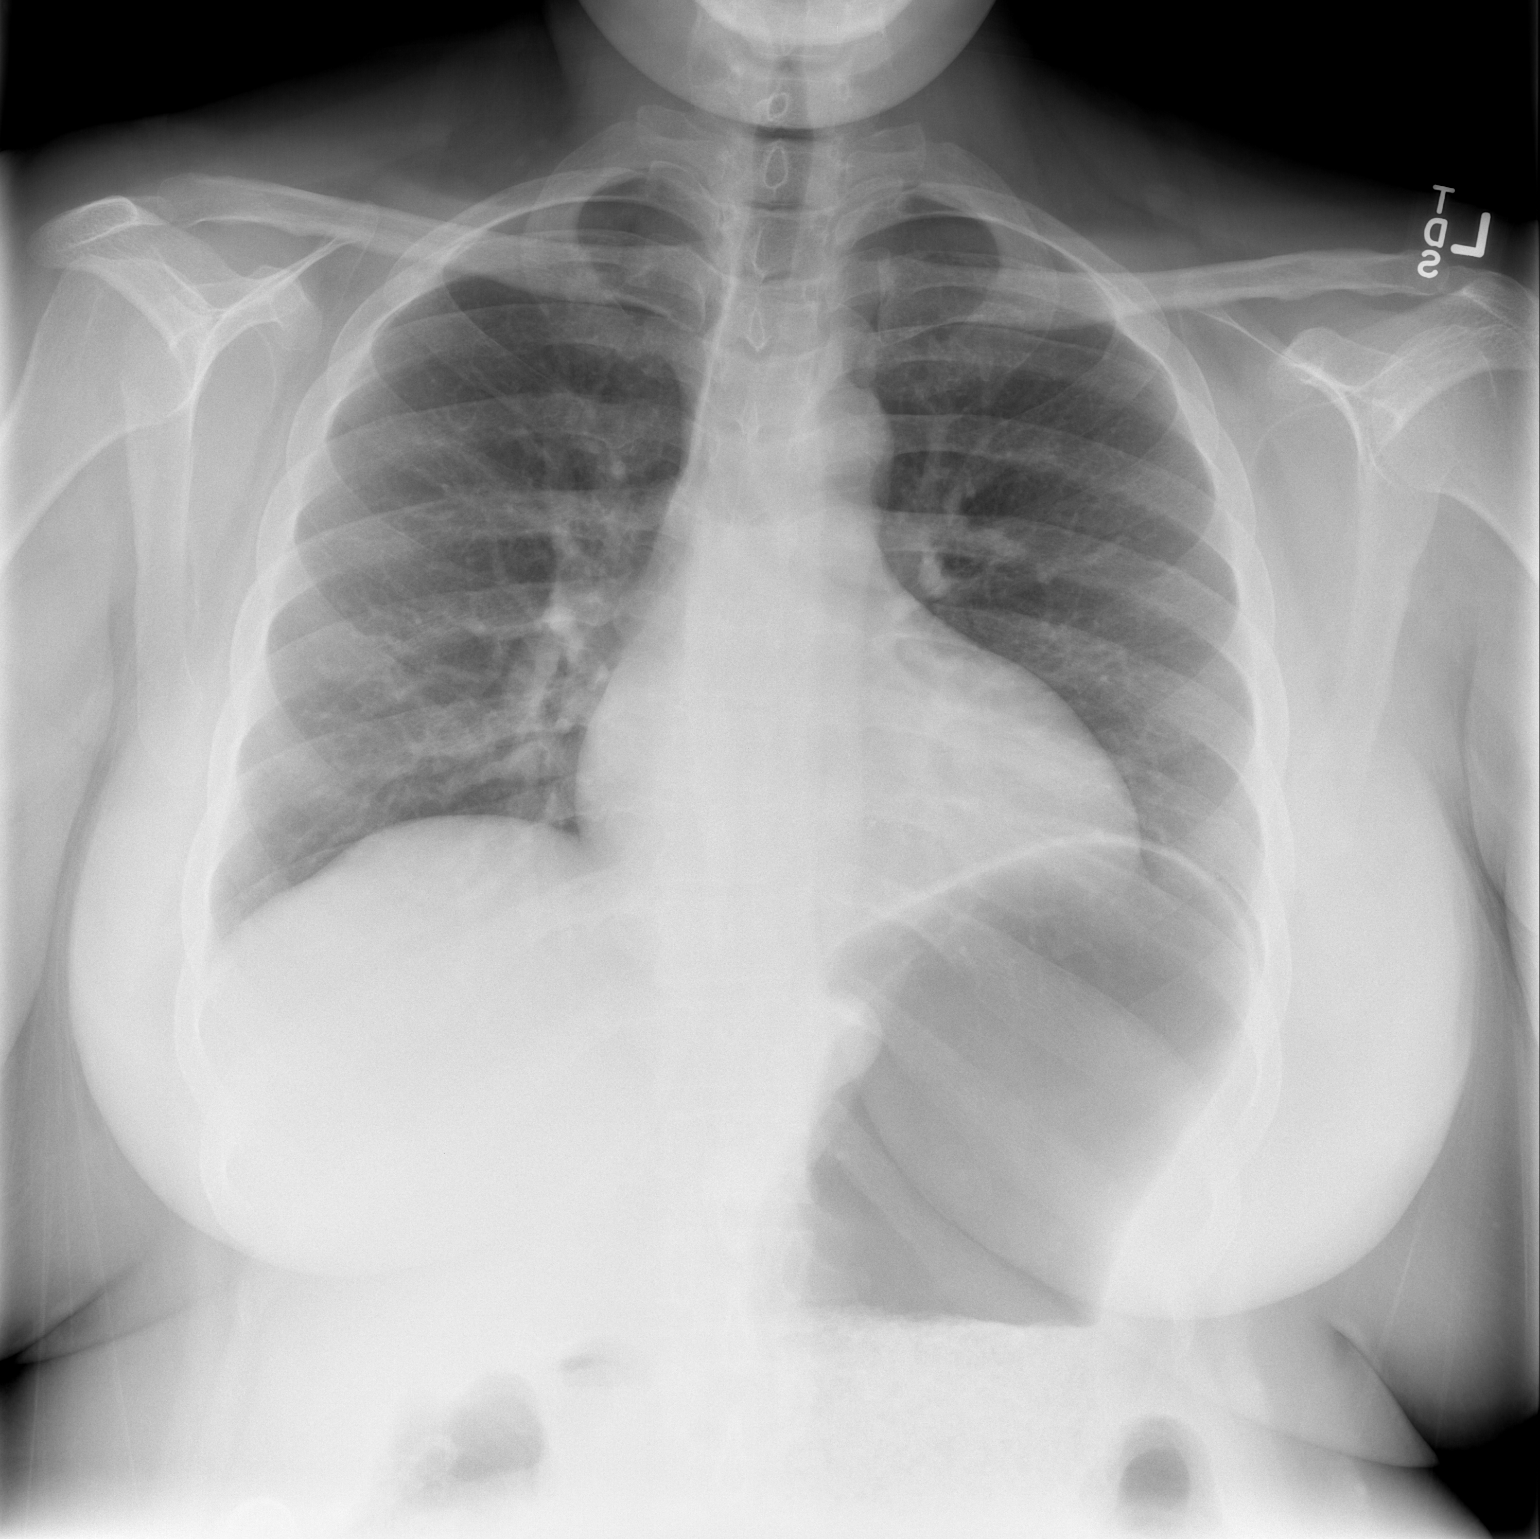

[w chest lat]
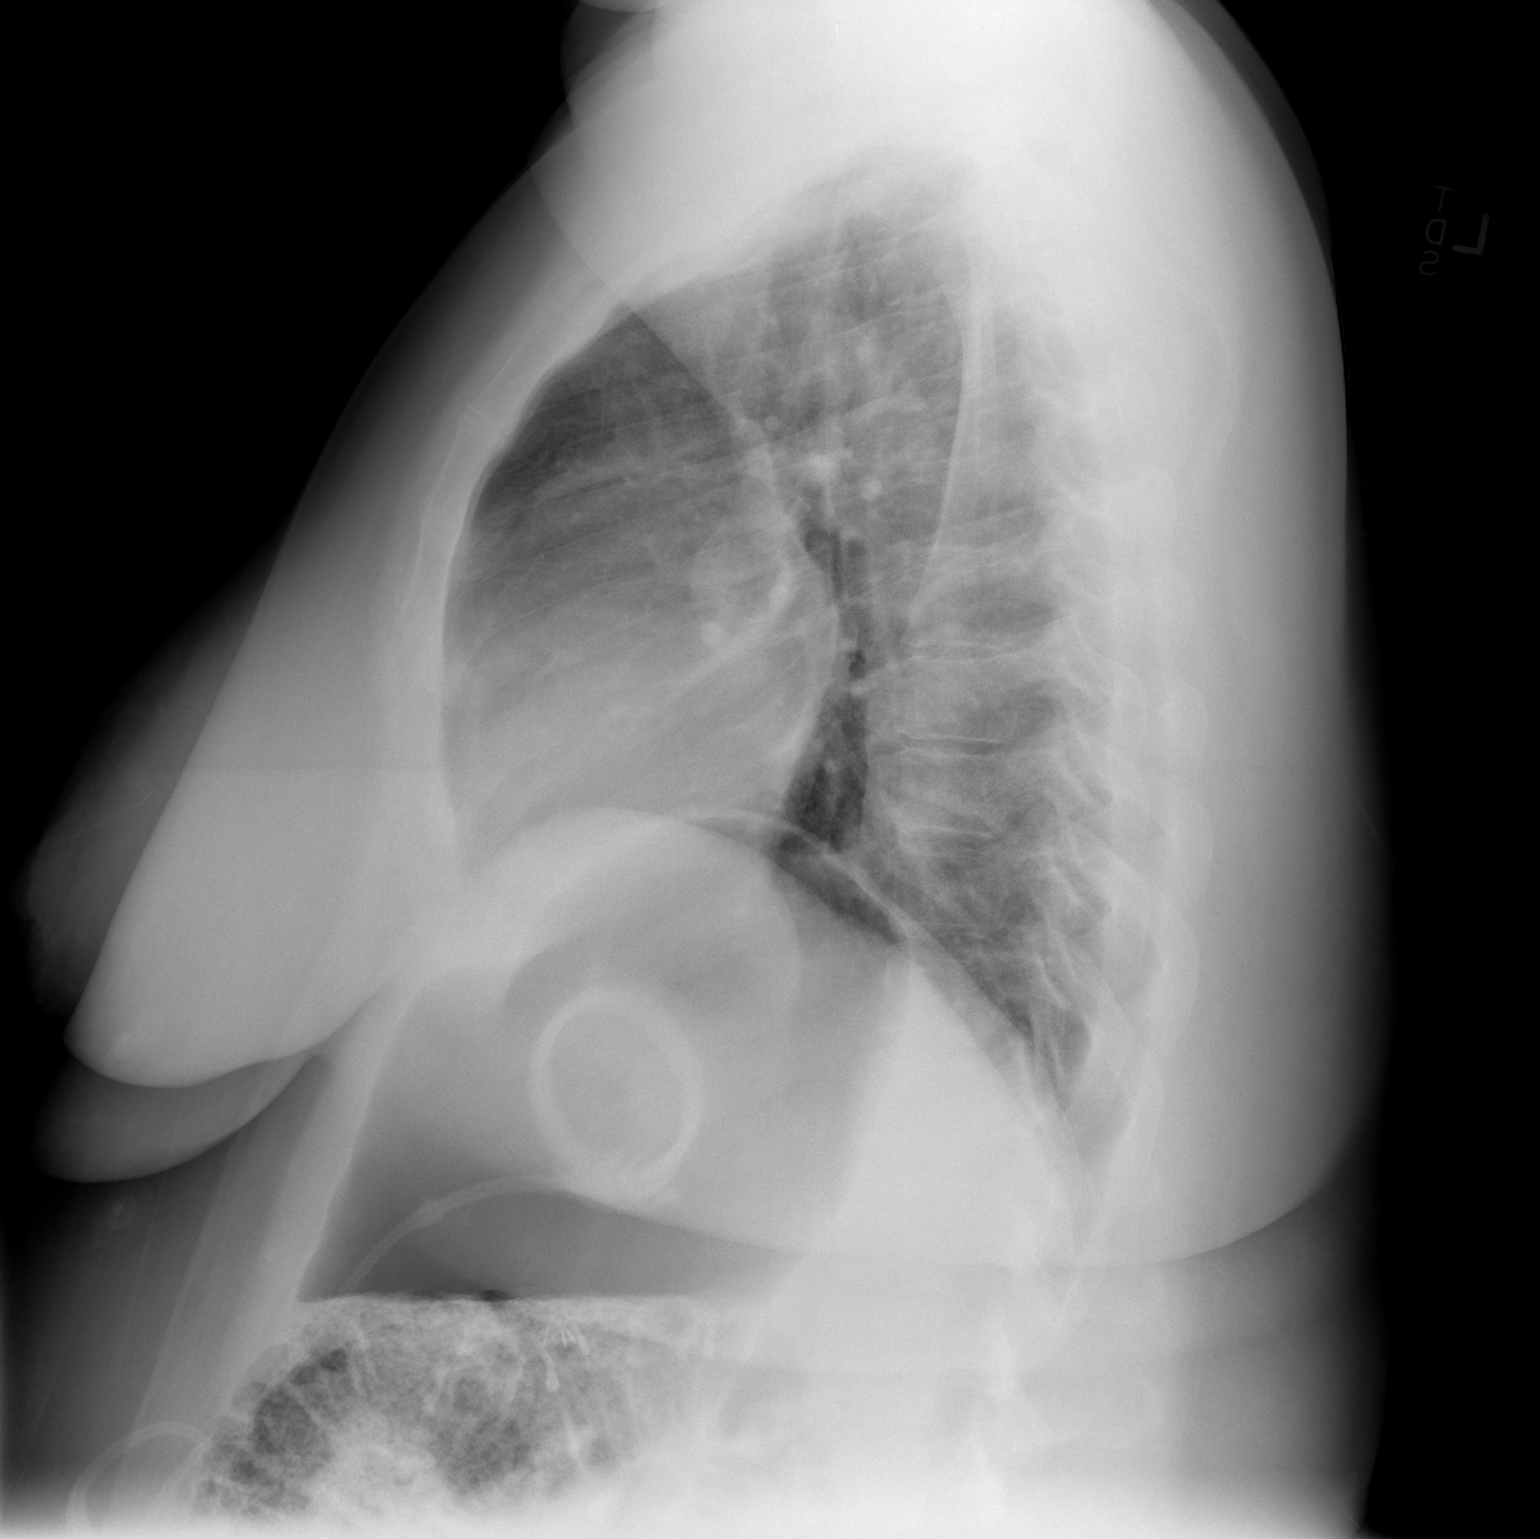

[2 of 2 positions shown; findings below may reference images not displayed]

FINDINGS: Heart is upper limits normal in size. Lungs are clear. No effusions.
No acute bony abnormality. Lap band device is noted in the upper
abdomen.
IMPRESSION: No active cardiopulmonary disease.

## 2013-09-19 MED ORDER — MORPHINE SULFATE 4 MG/ML IJ SOLN: 4.0000 mg | Freq: Once | INTRAMUSCULAR | Status: DC

## 2013-09-19 MED ORDER — ACETAMINOPHEN 500 MG PO TABS
1000.0000 mg | ORAL_TABLET | ORAL | Status: AC
  Administered 2013-09-19: 1000 mg via ORAL
  Filled 2013-09-19: qty 2

## 2013-09-19 NOTE — ED Notes (Signed)
Pt de

## 2013-09-19 NOTE — ED Notes (Signed)
Pt. reports mid chest/epigastric tightness onset this evening with SOB , denies nausea or diaphoresis .

## 2013-09-19 NOTE — ED Provider Notes (Signed)
CSN: [id number]     Arrival date & time 09/19/13  1955 History   First MD Initiated Contact with Patient 09/19/13 2029     Chief Complaint  Patient presents with  . Chest Pain     (Consider location/radiation/quality/duration/timing/severity/associated sxs/prior Treatment) HPI Comments: Jamie Mccarthy is a 38 y.o. female with a PMHx of GERD, iron deficiency anemia with sickle cell trait, HTN, anxiety, and chronic migraines, and PSHx of gastric lap band in 2005, lap chole, and BTL, who presents to the ED with complaints of CP that began 2hrs PTA. States she awoke at 5am this morning, felt fine, and around 7 AM she took her Topamax which she recently began taking for her migraines as of Friday. Subsequently after this, she developed some paresthesias in b/l hands, L>R, which lingered around all day and had no aggravating or alleviating factors. Around 7pm, she was sitting on the couch, when she developed left chest pressure and epigastric pressure which was worse with movement and inspiration and accompanied by a feeling of shortness of breath which resolved quickly. Reports the pain is 7/10 at onset but has gradually improved and she is no longer having this pain. Pain is nonradiating and she has no back pain. She denies that exertion aggravated his pain she did not try any medications for this pain, no known alleviating factors. The time that the pain began she had some lightheadedness with standing which also resolved prior to arrival. She denies any diaphoresis but was nauseous without any vomiting. As she became more concerned with the symptoms, she developed some perioral tingling which also rapidly resolved. At this time she presented to the ED. States that this does not feel similar to her GERD symptoms, and that she has never had this with her anxiety. She states that she used to take Wellbutrin for anxiety but is no longer been taking that, but that she did not feel particularly anxious this  morning. Denies fevers, chills, diaphoresis, cough, lower abdominal pain, vomiting, melena, hematochezia, myalgias, arthralgias, weakness, claudication, PND, orthopnea, palpitations, syncope, or weakness. Has a history of hypertension intermittently which recently has become worse, but she has not been evaluated by her PCP. Denies taking any estrogens, no known coronary artery disease, no diabetes or high cholesterol. Currently on menses. No history of DVT recent travel or recent surgery. Denies smoking.  Patient is a 38 y.o. female presenting with chest pain. The history is provided by the patient. No language interpreter was used.  Chest Pain Pain location:  L chest and epigastric Pain quality: pressure   Pain radiates to:  Does not radiate Pain radiates to the back: no   Pain severity:  Moderate (7/10) Onset quality:  Gradual Duration:  2 hours Timing:  Constant Progression:  Resolved Chronicity:  New Context: at rest   Relieved by:  None tried Worsened by:  Deep breathing and movement Ineffective treatments:  None tried Associated symptoms: abdominal pain (epigastric), anxiety (PMHx, but denies current anxiety although admits she's unsure), nausea and shortness of breath (resolved PTA)   Associated symptoms: no anorexia, no back pain, no claudication, no cough, no diaphoresis, no dizziness, no fever, no headache, no heartburn, no lower extremity edema, no near-syncope, no numbness, no orthopnea, no palpitations, no PND, no syncope, not vomiting and no weakness   Risk factors: hypertension and obesity   Risk factors: no birth control, no coronary artery disease, no diabetes mellitus, no high cholesterol, not pregnant, no prior DVT/PE, no smoking and no  surgery     Past Medical History  Diagnosis Date  . PONV (postoperative nausea and vomiting)     has had PONV with previous c/s and also lap band surgery 2004  . GERD (gastroesophageal reflux disease)     diet controlled  . Anemia      Has sickle cell trait  . Iron deficiency anemia, unspecified 10/20/2012  . Hypertension   . Gestational diabetes   . Headache(784.0)   . Herpes 2007    hx of  . Seasonal allergies   . H/O vaginal delivery 2010   Past Surgical History  Procedure Laterality Date  . Laparoscopic gastric banding  2005  . Cesarean section  2012  . Cesarean section N/A 07/16/2012    Procedure: REPEAT CESAREAN SECTION;  Surgeon: Logan Bores, MD;  Location: [facility];  Service: Obstetrics;  Laterality: N/A;  . Bilateral salpingectomy  07/16/2012    Procedure: BILATERAL DISTAL SALPINGECTOMY;  Surgeon: Logan Bores, MD;  Location: [facility];  Service: Obstetrics;;  . Reposition gastric band  2006    x2  . Wisdom tooth extraction  1999  . Tubal ligation  2014  . Gastric banding port revision N/A 11/14/2012    Procedure: GASTRIC BANDING PORT REVISION;  Surgeon: Gayland Curry, MD;  Location: [facility];  Service: General;  Laterality: N/A;  PORT PLACEMENT MOVED NEW MESH INSERTED   . Cholecystectomy N/A 11/14/2012    Procedure: LAPAROSCOPIC CHOLECYSTECTOMY;  Surgeon: Gayland Curry, MD;  Location: [facility];  Service: General;  Laterality: N/A;   Family History  Problem Relation Age of Onset  . Diabetes      father's family  . Breast cancer Maternal Grandmother   . Hypertension Mother   . Thyroid cancer     History  Substance Use Topics  . Smoking status: Never Smoker   . Smokeless tobacco: Never Used  . Alcohol Use: No   OB History   Grav Para Term Preterm Abortions TAB SAB Ect Mult Living   3 3 2 1  0 0 0 0 1 4     Review of Systems  Constitutional: Negative for fever, chills and diaphoresis.  HENT: Negative for congestion, rhinorrhea and sinus pressure.   Eyes: Negative for visual disturbance.  Respiratory: Positive for chest tightness (pressure) and shortness of breath (resolved PTA). Negative for cough and wheezing.   Cardiovascular: Negative for palpitations, orthopnea, claudication, leg  swelling, syncope, PND and near-syncope.  Gastrointestinal: Positive for nausea and abdominal pain (epigastric). Negative for heartburn, vomiting, diarrhea, constipation, blood in stool and anorexia.  Genitourinary: Negative for dysuria, urgency, frequency, hematuria, flank pain, decreased urine volume, vaginal bleeding, vaginal discharge, vaginal pain and menstrual problem.  Musculoskeletal: Negative for arthralgias, back pain and myalgias.  Skin: Negative for color change.  Neurological: Positive for light-headedness (with standing, now resolved). Negative for dizziness, syncope, weakness, numbness and headaches.       +paresthesias in B/L hands and perioral which is now resolved  Psychiatric/Behavioral: Negative for confusion.  10 Systems reviewed and are negative for acute change except as noted in the HPI.     Allergies  Latex  Home Medications   Prior to Admission medications   Medication Sig Start Date End Date Taking? Authorizing Provider  ferrous sulfate 325 (65 FE) MG tablet Take 325 mg by mouth daily with breakfast.   Yes Historical Provider, MD  topiramate (TOPAMAX) 50 MG tablet 1/2 tab QD x 3 days, then increase to whole tab daily x  5 days. Then increase to BID. 09/18/13  Yes Hali Marry, MD  LORazepam (ATIVAN) 1 MG tablet Take 0.5 tablets (0.5 mg total) by mouth every 8 (eight) hours as needed for anxiety. 09/20/13   Kla Bily Strupp Camprubi-Soms, PA-C  naproxen (NAPROSYN) 500 MG tablet Take 1 tablet (500 mg total) by mouth 2 (two) times daily as needed for mild pain, moderate pain or headache (TAKE WITH MEALS.). 09/20/13   Ranette Luckadoo Strupp Camprubi-Soms, PA-C   BP 130/85  Pulse 77  Temp(Src) 98.1 F (36.7 C)  Resp 23  SpO2 99%  LMP 09/13/2013 Physical Exam  Nursing note and vitals reviewed. Constitutional: She is oriented to person, place, and time. Vital signs are normal. She appears well-developed and well-nourished. No distress.  VSS at time of exam, NAD   HENT:  Head: Normocephalic and atraumatic.  Mouth/Throat: Oropharynx is clear and moist and mucous membranes are normal.  Eyes: Conjunctivae and EOM are normal. Pupils are equal, round, and reactive to light. Right eye exhibits no discharge. Left eye exhibits no discharge.  Neck: Normal range of motion. Neck supple. No JVD present. Carotid bruit is not present.  Cardiovascular: Normal rate, regular rhythm, normal heart sounds and intact distal pulses.  Exam reveals no gallop and no friction rub.   No murmur heard. RRR, nl s1/s2, no m/r/g, distal pulses intact and equal in all extremities  Pulmonary/Chest: Effort normal and breath sounds normal. No respiratory distress. She has no decreased breath sounds. She has no wheezes. She has no rhonchi. She has no rales. She exhibits tenderness. She exhibits no crepitus, no deformity, no swelling and no retraction.  CTAB in all lung fields Reproducible CP in L chest and sternum just above epigastrum, no crepitus or deformity, no subQ air or swelling, no retraction. No increased WOB  Abdominal: Soft. Normal appearance and bowel sounds are normal. She exhibits no distension. There is no tenderness. There is no rigidity, no rebound, no guarding and no CVA tenderness.  Musculoskeletal: Normal range of motion.  MAE x4, strength 5/5 in all extremities, sensation grossly intact in all extremities  Neurological: She is alert and oriented to person, place, and time. She has normal strength. No sensory deficit. Coordination normal.  Skin: Skin is warm, dry and intact. No rash noted.  Psychiatric: She has a normal mood and affect.    ED Course  Procedures (including critical care time) Labs Review Labs Reviewed  CBC - Abnormal; Notable for the following:    MCV 76.5 (*)    All other components within normal limits  URINALYSIS, ROUTINE W REFLEX MICROSCOPIC - Abnormal; Notable for the following:    pH 8.5 (*)    Hgb urine dipstick MODERATE (*)    All other  components within normal limits  URINE MICROSCOPIC-ADD ON - Abnormal; Notable for the following:    Squamous Epithelial / LPF MANY (*)    All other components within normal limits  BASIC METABOLIC PANEL  PRO B NATRIURETIC PEPTIDE  I-STAT TROPOININ, ED  POC URINE PREG, ED  Randolm Idol, ED    Imaging Review Dg Chest 2 View  09/19/2013   CLINICAL DATA:  Upper chest pressure.  Shortness of breath.  EXAM: CHEST  2 VIEW  COMPARISON:  11/29/2012  FINDINGS: Heart is upper limits normal in size. Lungs are clear. No effusions. No acute bony abnormality. Lap band device is noted in the upper abdomen.  IMPRESSION: No active cardiopulmonary disease.   Electronically Signed   By: Lennette Bihari  Dover M.D.   On: 09/19/2013 20:46     EKG Interpretation   Date/Time:  Saturday September 19 2013 20:01:18 EDT Ventricular Rate:  84 PR Interval:  140 QRS Duration: 68 QT Interval:  344 QTC Calculation: 406 R Axis:   46 Text Interpretation:  Normal sinus rhythm Low voltage QRS Borderline ECG  Similar to prior Confirmed by Mingo Amber  MD, [place] ([id number]) on 09/19/2013  8:48:43 PM      MDM   Final diagnoses:  Chest pain, atypical  Anxiety    38y/o female with atypical CP, reproducible on exam, no resolved. Likely musculoskeletal with some component of anxiety, could be medication related given recent addition of topamax. Initial trop neg, EKG unremarkable, CBC unremarkable. Awaiting CXR, BNP, BMP, U/A, Upreg. Pt denies wanting pain meds or anxiety meds at this time. VSS at this time, will reassess shortly after labs return. Will not dimer since [facility] neg. Doubt PE/DVT  11:08 PM CXR neg, BMP unremarkable, U/A neg, Upreg neg. Still awaiting BNP. Called lab, states the tube was never sent although it states it was collected. Will run it on BMP blood collection now. Having her usual migraine, will give morphine now. Continues to deny any ongoing chest pressure or desire for pain/anxiety meds. Nursing reporting that she  never obtained an IV, therefore will switch to tylenol PO. Will obtain second trop since proper amount of time has passed and likely d/c home given lack of anginal type symptoms and low risk factors.   12:00 AM BNP neg. Second trop neg. I believe this is musculoskeletal and some component of anxiety. Will give naprosyn for pain with instructions to use heat to help, and ativan given with strict instructions to use only PRN, pt with hx of anxiety and discussed f/up with PCP to discuss ongoing anxiolytics. I explained the diagnosis and have given explicit precautions to return to the ER including for any other new or worsening symptoms. The patient understands and accepts the medical plan as it's been dictated and I have answered their questions. Discharge instructions concerning home care and prescriptions have been given. The patient is STABLE and is discharged to home in good condition.  BP 126/86  Pulse 72  Temp(Src) 98.1 F (36.7 C)  Resp 17  SpO2 99%  LMP 09/13/2013  Meds ordered this encounter  Medications  . acetaminophen (TYLENOL) tablet 1,000 mg    Sig:   . naproxen (NAPROSYN) 500 MG tablet    Sig: Take 1 tablet (500 mg total) by mouth 2 (two) times daily as needed for mild pain, moderate pain or headache (TAKE WITH MEALS.).    Dispense:  20 tablet    Refill:  0    Order Specific Question:  Supervising Provider    Answer:  Noemi Chapel D [[id number]]  . LORazepam (ATIVAN) 1 MG tablet    Sig: Take 0.5 tablets (0.5 mg total) by mouth every 8 (eight) hours as needed for anxiety.    Dispense:  7 tablet    Refill:  0    Order Specific Question:  Supervising Provider    Answer:  Johnna Acosta [[id number]]     [place], PA-C 09/20/13 [phone]

## 2013-09-20 LAB — PRO B NATRIURETIC PEPTIDE: Pro B Natriuretic peptide (BNP): 36.9 pg/mL (ref 0–125)

## 2013-09-20 MED ORDER — LORAZEPAM 1 MG PO TABS: 0.5000 mg | ORAL_TABLET | Freq: Three times a day (TID) | ORAL | Status: DC | PRN

## 2013-09-20 MED ORDER — NAPROXEN 500 MG PO TABS: 500.0000 mg | ORAL_TABLET | Freq: Two times a day (BID) | ORAL | Status: DC | PRN

## 2013-09-20 NOTE — Discharge Instructions (Signed)
Your chest pain seems to be musculoskeletal, use naprosyn as directed for pain, and apply heat to the area for 20 minutes at a time every hour. Use ativan as directed, as needed for anxiety. See your regular doctor in 3 days for a recheck, but return to the ER for any changes or worsening of symptoms.   Chest Wall Pain Chest wall pain is pain felt in or around the chest bones and muscles. It may take up to 6 weeks to get better. It may take longer if you are active. Chest wall pain can happen on its own. Other times, things like germs, injury, coughing, or exercise can cause the pain. HOME CARE   Avoid activities that make you tired or cause pain. Try not to use your chest, belly (abdominal), or side muscles. Do not use heavy weights.  Put ice on the sore area.  Put ice in a plastic bag.  Place a towel between your skin and the bag.  Leave the ice on for 15-20 minutes for the first 2 days.  Only take medicine as told by your doctor. GET HELP RIGHT AWAY IF:   You have more pain or are very uncomfortable.  You have a fever.  Your chest pain gets worse.  You have new problems.  You feel sick to your stomach (nauseous) or throw up (vomit).  You start to sweat or feel lightheaded.  You have a cough with mucus (phlegm).  You cough up blood. MAKE SURE YOU:   Understand these instructions.  Will watch your condition.  Will get help right away if you are not doing well or get worse. Document Released: 06/27/2007 Document Revised: 04/02/2011 Document Reviewed: 09/04/2010 [facility] Patient Information 2015 [place], [place]. This information is not intended to replace advice given to you by your health care provider. Make sure you discuss any questions you have with your health care provider.  Panic Attacks Panic attacks are sudden, short feelings of great fear or discomfort. You may have them for no reason when you are relaxed, when you are uneasy (anxious), or when you are sleeping.   HOME CARE  Take all your medicines as told.  Check with your doctor before starting new medicines.  Keep all doctor visits. GET HELP IF:  You are not able to take your medicines as told.  Your symptoms do not get better.  Your symptoms get worse. GET HELP RIGHT AWAY IF:  Your attacks seem different than your normal attacks.  You have thoughts about hurting yourself or others.  You take panic attack medicine and you have a side effect. MAKE SURE YOU:  Understand these instructions.  Will watch your condition.  Will get help right away if you are not doing well or get worse. Document Released: 02/10/2010 Document Revised: 10/29/2012 Document Reviewed: 08/22/2012 [facility] Patient Information 2015 [place], [place]. This information is not intended to replace advice given to you by your health care provider. Make sure you discuss any questions you have with your health care provider.

## 2013-09-20 NOTE — ED Provider Notes (Signed)
Medical screening examination/treatment/procedure(s) were performed by non-physician practitioner and as supervising physician I was immediately available for consultation/collaboration.   EKG Interpretation   Date/Time:  Saturday September 19 2013 20:01:18 EDT Ventricular Rate:  84 PR Interval:  140 QRS Duration: 68 QT Interval:  344 QTC Calculation: 406 R Axis:   46 Text Interpretation:  Normal sinus rhythm Low voltage QRS Borderline ECG  Similar to prior Confirmed by Mingo Amber  MD, [place] ([id number]) on 09/19/2013  8:48:43 PM        Evelina Bucy, MD 09/20/13 2358

## 2013-09-25 ENCOUNTER — Ambulatory Visit (INDEPENDENT_AMBULATORY_CARE_PROVIDER_SITE_OTHER): Payer: 59 | Admitting: Family Medicine

## 2013-09-25 ENCOUNTER — Encounter: Payer: Self-pay | Admitting: Family Medicine

## 2013-09-25 VITALS — BP 118/72 | HR 81 | Wt 237.0 lb

## 2013-09-25 DIAGNOSIS — R110 Nausea: Secondary | ICD-10-CM

## 2013-09-25 DIAGNOSIS — R5383 Other fatigue: Secondary | ICD-10-CM

## 2013-09-25 DIAGNOSIS — D509 Iron deficiency anemia, unspecified: Secondary | ICD-10-CM

## 2013-09-25 DIAGNOSIS — Z23 Encounter for immunization: Secondary | ICD-10-CM

## 2013-09-25 DIAGNOSIS — N920 Excessive and frequent menstruation with regular cycle: Secondary | ICD-10-CM

## 2013-09-25 DIAGNOSIS — G43909 Migraine, unspecified, not intractable, without status migrainosus: Secondary | ICD-10-CM

## 2013-09-25 DIAGNOSIS — R5381 Other malaise: Secondary | ICD-10-CM

## 2013-09-25 DIAGNOSIS — F411 Generalized anxiety disorder: Secondary | ICD-10-CM

## 2013-09-25 MED ORDER — BUPROPION HCL ER (XL) 150 MG PO TB24: 150.0000 mg | ORAL_TABLET | ORAL | Status: DC

## 2013-09-25 MED ORDER — SUMATRIPTAN SUCCINATE 100 MG PO TABS: 100.0000 mg | ORAL_TABLET | ORAL | Status: DC | PRN

## 2013-09-25 NOTE — Progress Notes (Addendum)
   Subjective:    Patient ID: Jamie Mccarthy, female    DOB: 06-02-1975, 38 y.o.   MRN: [id number]  Headache    Went ot ED about 5 days ago for atypical chest pain.  Recently started back on topamax for her migraines 9 about 6 days ago).  Has had persistant nausea and loss of appetite. Started back last week.  Given Naprosyn and lorazepam.  Given 7 tabs.  No acute treatments for her HA. She admits she has been very stressed at home and at work. She says she has a good boss that puts a lot of pressure on herself to perform well. She also has a lot of stress at home.  Has been having 2 periods a month since December. She says she'll bleed for 6-10 days and then not have a period for about a week and then start again. She does have a history of fibroids  Review of Systems  Neurological: Positive for headaches.   She does complain of feeling nervous and on edge over half the days and feeling like she cannot control her worrying. She has difficulty relaxing and becomes easily annoyed and irritable really every day.    Objective:   Physical Exam  Constitutional: She is oriented to person, place, and time. She appears well-developed and well-nourished.  HENT:  Head: Normocephalic and atraumatic.  Cardiovascular: Normal rate, regular rhythm and normal heart sounds.   Pulmonary/Chest: Effort normal and breath sounds normal.  Abdominal: Soft. Bowel sounds are normal. She exhibits no distension and no mass. There is no tenderness. There is no rebound and no guarding.  Neurological: She is alert and oriented to person, place, and time.  Skin: Skin is warm and dry.  Psychiatric: She has a normal mood and affect. Her behavior is normal.          Assessment & Plan:  Migraines - she's restarted her Topamax. Her monitor intake about 2 months to recheck efficacy. And then we can adjust her dose as needed. Will start Imitrex as needed for rescue.  Anxiety -gad 7 score of 16 today. Uncontrolled. we  discussed different options. She has taken Wellbutrin in the past and did well with that without any side effects or pounds. We'll restart this medication as her back in one month.  Frequent/irregular periods-we'll check TSH. Also recheck her ferritin she does have a history of iron deficiency anemia. With her history of fibroids encouraged her to get back in with her GYN to discuss her symptoms. Her regular checkup is in for 3 more years since she's had a normal Pap smear. Explained that she still may need further evaluation for her fibroids.  Flu shot given today.

## 2013-10-02 ENCOUNTER — Ambulatory Visit: Payer: 59 | Admitting: Family Medicine

## 2013-10-05 ENCOUNTER — Encounter: Payer: Self-pay | Admitting: Family Medicine

## 2013-10-05 ENCOUNTER — Other Ambulatory Visit: Payer: Self-pay | Admitting: Family Medicine

## 2013-10-05 ENCOUNTER — Ambulatory Visit (INDEPENDENT_AMBULATORY_CARE_PROVIDER_SITE_OTHER): Payer: 59 | Admitting: Family Medicine

## 2013-10-05 VITALS — BP 141/81 | HR 109 | Wt 235.0 lb

## 2013-10-05 DIAGNOSIS — T7840XA Allergy, unspecified, initial encounter: Secondary | ICD-10-CM

## 2013-10-05 DIAGNOSIS — N760 Acute vaginitis: Secondary | ICD-10-CM

## 2013-10-05 LAB — WET PREP FOR TRICH, YEAST, CLUE
CLUE CELLS WET PREP: NONE SEEN
Trich, Wet Prep: NONE SEEN

## 2013-10-05 MED ORDER — METHYLPREDNISOLONE SODIUM SUCC 125 MG IJ SOLR
125.0000 mg | Freq: Once | INTRAMUSCULAR | Status: AC
  Administered 2013-10-05: 125 mg via INTRAMUSCULAR

## 2013-10-05 MED ORDER — FLUCONAZOLE 150 MG PO TABS: 150.0000 mg | ORAL_TABLET | Freq: Every day | ORAL | Status: DC

## 2013-10-05 NOTE — Progress Notes (Signed)
   Subjective:    Patient ID: Jamie Mccarthy, female    DOB: [birth year], 38 y.o.   MRN: [id number]  Rash   pt reports that she began itching on her arms it then spread to her chest, back, face. she has used hydrocortisone cream this has not helped she has not had any changes in her diet, lotion,soap,detergents, ect. she did take naproxen yesterday and began to break out this morning. Stopped the wellbutrin and the imitrex about 2 days ago.  Held the topamax as well.  Her face has been broken out the last week or two and that is why she held other drugs.  No diarrhea. Nauseated this morning.  She denies any shortness of breath or breathing problems.    Has also had some vaginal discomfort and a yellow discharge the last couple of days.     Review of Systems  Skin: Positive for rash.       Objective:   Physical Exam  Constitutional: She appears well-developed and well-nourished.  HENT:  Head: Normocephalic and atraumatic.  Skin: Skin is warm and dry. Rash noted.  Hives on her upper chest, back, arms and legs. She has a few papules on her cheeks.  Psychiatric: She has a normal mood and affect. Her behavior is normal.          Assessment & Plan:  Allergic reaction-  most likely from the naproxen since she had held her Wellbutrin, Imitrex and Topamax about 2 days prior. She had been using the naproxen for shoulder pain. Recommend discontinue the medication. We will give her Solu-Medrol and 25 mg for the itching and. Also recommend Allegra or Zyrtec to help with itching as well. The proximal and does have a fairly long half-life of about 15 hours so this may take several days to resolve.  Vaginitis - will collect wet prep specimen for further evaluation.

## 2013-10-12 ENCOUNTER — Encounter: Payer: Self-pay | Admitting: Physician Assistant

## 2013-10-12 ENCOUNTER — Ambulatory Visit (INDEPENDENT_AMBULATORY_CARE_PROVIDER_SITE_OTHER): Payer: 59 | Admitting: Physician Assistant

## 2013-10-12 VITALS — BP 132/77 | HR 86 | Temp 98.0°F | Ht 64.0 in | Wt 233.0 lb

## 2013-10-12 DIAGNOSIS — R059 Cough, unspecified: Secondary | ICD-10-CM

## 2013-10-12 DIAGNOSIS — R0982 Postnasal drip: Secondary | ICD-10-CM

## 2013-10-12 DIAGNOSIS — J209 Acute bronchitis, unspecified: Secondary | ICD-10-CM

## 2013-10-12 DIAGNOSIS — R05 Cough: Secondary | ICD-10-CM

## 2013-10-12 MED ORDER — DESONIDE 0.05 % EX GEL: Freq: Two times a day (BID) | CUTANEOUS | Status: DC

## 2013-10-12 MED ORDER — BENZONATATE 200 MG PO CAPS: 200.0000 mg | ORAL_CAPSULE | Freq: Two times a day (BID) | ORAL | Status: DC | PRN

## 2013-10-12 MED ORDER — HYDROCODONE-HOMATROPINE 5-1.5 MG/5ML PO SYRP: 5.0000 mL | ORAL_SOLUTION | Freq: Two times a day (BID) | ORAL | Status: DC | PRN

## 2013-10-12 MED ORDER — AZITHROMYCIN 250 MG PO TABS: ORAL_TABLET | ORAL | Status: DC

## 2013-10-12 NOTE — Patient Instructions (Signed)
mucinex D 1 tablet twice daily. Drinking water.  Gave cough.  If not improving start zpak.

## 2013-10-12 NOTE — Progress Notes (Signed)
   Subjective:    Patient ID: Jamie Mccarthy, female    DOB: 25-Mar-1975, 38 y.o.   MRN: [id number]  HPI Pt is a 38 yo female who presents to the clinic with coughing until she vomits. Cough started  Coughing started last Tuesday. Cough until throwing up. On average 4-6 times a day. Feels nauseated. No fever. Some off and on chills. No sinus pressure. Feels drainage going down back of throat. No SOB or wheezing.   Continues to have itchy rash on face. Has improved some. Presented with body rash last week.     Review of Systems  All other systems reviewed and are negative.      Objective:   Physical Exam  Constitutional: She is oriented to person, place, and time. She appears well-developed and well-nourished.  HENT:  Head: Normocephalic and atraumatic.  Right Ear: External ear normal.  Left Ear: External ear normal.  Nose: Nose normal.  Mouth/Throat: Oropharynx is clear and moist.  Eyes: Conjunctivae are normal. Right eye exhibits no discharge. Left eye exhibits no discharge.  Neck: Normal range of motion. Neck supple.  Cardiovascular: Normal rate, regular rhythm and normal heart sounds.   Pulmonary/Chest: Effort normal and breath sounds normal. She has no wheezes.  Productive cough on exam today.   Abdominal: Soft. Bowel sounds are normal.  Tender around lap band port.  Lymphadenopathy:    She has no cervical adenopathy.  Neurological: She is alert and oriented to person, place, and time.  Skin: Skin is dry.  Psychiatric: She has a normal mood and affect. Her behavior is normal.          Assessment & Plan:  PND/Cough/bronchitis- treated with zpak. Hycodan at bedtime and tessalon during the day. HO given. Follow up as needed. zofran for nausea. Hopefully cough will improve and vomiting improve. Stay hydrated.   Allergic reaction-concerning it could be some rosacea however with onset after whole body rash will treat with desonide bid for itch and rash. If not improving  consider treatment for rosacea.

## 2013-10-13 ENCOUNTER — Telehealth: Payer: Self-pay | Admitting: *Deleted

## 2013-10-13 NOTE — Telephone Encounter (Signed)
PA approved for Desonate until 10/14/18.

## 2013-10-23 ENCOUNTER — Ambulatory Visit: Payer: 59 | Admitting: Family Medicine

## 2013-11-13 ENCOUNTER — Ambulatory Visit: Payer: 59 | Admitting: Family Medicine

## 2013-11-23 ENCOUNTER — Encounter: Payer: Self-pay | Admitting: Physician Assistant

## 2013-11-29 ENCOUNTER — Emergency Department (INDEPENDENT_AMBULATORY_CARE_PROVIDER_SITE_OTHER)
Admission: EM | Admit: 2013-11-29 | Discharge: 2013-11-29 | Disposition: A | Discharge status: Home / Self Care | Payer: 59 | Source: Home / Self Care | Attending: Emergency Medicine | Admitting: Emergency Medicine

## 2013-11-29 DIAGNOSIS — B349 Viral infection, unspecified: Secondary | ICD-10-CM

## 2013-11-29 DIAGNOSIS — N912 Amenorrhea, unspecified: Secondary | ICD-10-CM

## 2013-11-29 DIAGNOSIS — R110 Nausea: Secondary | ICD-10-CM

## 2013-11-29 LAB — POCT URINE PREGNANCY: Preg Test, Ur: NEGATIVE

## 2013-11-29 LAB — POCT INFLUENZA A/B
Influenza A, POC: NEGATIVE
Influenza B, POC: NEGATIVE

## 2013-11-29 NOTE — ED Notes (Signed)
Cough for approx one week, patient states that cough makes her vomit at times and that she saw blood when she got sick.

## 2013-11-29 NOTE — ED Provider Notes (Signed)
CSN: [id number]     Arrival date & time 11/29/13  1336 History   First MD Initiated Contact with Patient 11/29/13 1339     Chief Complaint  Patient presents with  . Cough    HPI URI HISTORY  Jamie Mccarthy is a 38 y.o. female who complains of onset of cold symptoms for several days.other family members have similar illness.   Have been using over-the-counter treatment which helps a little bit.  No chills/sweats +  Low-gradeFever,not documented  +  Nasal congestion no Discolored Post-nasal drainage No sinus pain/pressure No sore throat  +  Cough episode this morning that induced vomiting 1. No blood or bile in the vomit. No wheezing No chest congestion No hemoptysis No shortness of breath No pleuritic pain  No itchy/red eyes No earache  + nausea. She feels it's the nausea similar to when she was pregnant in the past. However she is status post BTL.--we discussed this and she stated that she prefers that we do urine pregnancy test today  No abdominal pain No diarrhea  No skin rashes +  Fatigue No myalgias No headache   Past Medical History  Diagnosis Date  . PONV (postoperative nausea and vomiting)     has had PONV with previous c/s and also lap band surgery 2004  . GERD (gastroesophageal reflux disease)     diet controlled  . Anemia     Has sickle cell trait  . Iron deficiency anemia, unspecified 10/20/2012  . Hypertension   . Gestational diabetes   . Headache(784.0)   . Herpes 2007    hx of  . Seasonal allergies   . H/O vaginal delivery 2010   Past Surgical History  Procedure Laterality Date  . Laparoscopic gastric banding  2005  . Cesarean section  2012  . Cesarean section N/A 07/16/2012    Procedure: REPEAT CESAREAN SECTION;  Surgeon: Logan Bores, MD;  Location: [facility];  Service: Obstetrics;  Laterality: N/A;  . Bilateral salpingectomy  07/16/2012    Procedure: BILATERAL DISTAL SALPINGECTOMY;  Surgeon: Logan Bores, MD;  Location: [facility];   Service: Obstetrics;;  . Reposition gastric band  2006    x2  . Wisdom tooth extraction  1999  . Tubal ligation  2014  . Gastric banding port revision N/A 11/14/2012    Procedure: GASTRIC BANDING PORT REVISION;  Surgeon: Gayland Curry, MD;  Location: [facility];  Service: General;  Laterality: N/A;  PORT PLACEMENT MOVED NEW MESH INSERTED   . Cholecystectomy N/A 11/14/2012    Procedure: LAPAROSCOPIC CHOLECYSTECTOMY;  Surgeon: Gayland Curry, MD;  Location: [facility];  Service: General;  Laterality: N/A;   Family History  Problem Relation Age of Onset  . Diabetes      father's family  . Breast cancer Maternal Grandmother   . Hypertension Mother   . Thyroid cancer     History  Substance Use Topics  . Smoking status: Never Smoker   . Smokeless tobacco: Never Used  . Alcohol Use: No   OB History    Gravida Para Term Preterm AB TAB SAB Ectopic Multiple Living   3 3 2 1  0 0 0 0 1 4     Review of Systems  All other systems reviewed and are negative.   Allergies  Latex and Naproxen  Home Medications   Prior to Admission medications   Medication Sig Start Date End Date Taking? Authorizing Provider  buPROPion (WELLBUTRIN XL) 150 MG 24 hr tablet Take 1  tablet (150 mg total) by mouth every morning. 09/25/13 09/25/14 Yes Hali Marry, MD  desonide (DESONATE) 0.05 % gel Apply topically 2 (two) times daily. 10/12/13  Yes Jade L Breeback, PA-C  ferrous sulfate 325 (65 FE) MG tablet Take 325 mg by mouth daily with breakfast.   Yes Historical Provider, MD  LORazepam (ATIVAN) 1 MG tablet Take 0.5 tablets (0.5 mg total) by mouth every 8 (eight) hours as needed for anxiety. 09/20/13  Yes Mercedes Strupp Camprubi-Soms, PA-C  SUMAtriptan (IMITREX) 100 MG tablet Take 1 tablet (100 mg total) by mouth every 2 (two) hours as needed for migraine or headache. May repeat in 2 hours if headache persists or recurs. 09/25/13  Yes Hali Marry, MD  topiramate (TOPAMAX) 50 MG tablet 1/2 tab QD x 3 days,  then increase to whole tab daily x 5 days. Then increase to BID. 09/18/13  Yes Hali Marry, MD  azithromycin (ZITHROMAX) 250 MG tablet Take 2 tablets now and then one tablet for 4 days. 10/12/13   Jade L Breeback, PA-C  benzonatate (TESSALON) 200 MG capsule Take 1 capsule (200 mg total) by mouth 2 (two) times daily as needed for cough. 10/12/13   Jade L Breeback, PA-C  fluconazole (DIFLUCAN) 150 MG tablet Take 1 tablet (150 mg total) by mouth daily. 10/05/13   Hali Marry, MD  HYDROcodone-homatropine [facility]) 5-1.5 MG/5ML syrup Take 5 mLs by mouth every 12 (twelve) hours as needed. 10/12/13   Jade L Breeback, PA-C  naproxen (NAPROSYN) 500 MG tablet Take 1 tablet (500 mg total) by mouth 2 (two) times daily as needed for mild pain, moderate pain or headache (TAKE WITH MEALS.). 09/20/13   Mercedes Strupp Camprubi-Soms, PA-C  Ondansetron HCl (ZOFRAN PO) Take by mouth.    Historical Provider, MD   BP 138/80 mmHg  Pulse 86  Temp(Src) 97.8 F (36.6 C) (Oral)  Ht 5\' 4"  (1.626 m)  Wt 233 lb (105.688 kg)  BMI 39.97 kg/m2  SpO2 99% Physical Exam  Constitutional: She appears well-developed and well-nourished.  Non-toxic appearance. She appears ill (fatigued, but no cardiorespiratory distress). No distress.  HENT:  Head: Normocephalic and atraumatic.  Right Ear: Tympanic membrane and external ear normal.  Left Ear: Tympanic membrane and external ear normal.  Nose: Rhinorrhea present.  Mouth/Throat: Mucous membranes are normal. No oral lesions. No oropharyngeal exudate or posterior oropharyngeal erythema.  Eyes: Conjunctivae are normal. Right eye exhibits no discharge. Left eye exhibits no discharge. No scleral icterus.  Neck: Neck supple.  Cardiovascular: Normal rate, regular rhythm and normal heart sounds.   Pulmonary/Chest: Breath sounds normal. No stridor. No respiratory distress. She has no wheezes. She has no rales.  Abdominal: Soft. There is no tenderness.  Musculoskeletal: She  exhibits no edema.  Lymphadenopathy:    She has no cervical adenopathy (mild shoddy anterior cervical nodes).  Neurological: She is alert.  Skin: Skin is warm and intact. No rash noted. She is not diaphoretic.  Psychiatric: She has a normal mood and affect.  Nursing note and vitals reviewed.   ED Course  Procedures (including critical care time) Labs Review Labs Reviewed  POCT URINE PREGNANCY  POCT INFLUENZA A/B  discussed testing, and she agrees with doing urine pregnancy test and in-house nasal swab flu test   MDM   1. Acute viral syndrome   2. Nausea without vomiting   3. Amenorrhea     Rapid flu tests and urine pregnancy test all negative.--Reviewed with patient. She declined any  other testing at this time.  Explained that she and other family members likely have viral syndrome. No evidence of being toxic. No evidence of bacterial infection. Treatment options discussed, as well as risks, benefits, alternatives. Patient voiced understanding and agreement with the following plans:  Symptomatic care discussed.push clear liquids and advance diet as tolerated--May use Tylenol if needed for fever.    currently afebrile She declined any antinausea med or any prescription. Over 30 minutes spent, greater than 50% of the time spent for counseling and coordination of care. Follow-up with your primary care doctor in 2-3 days if not improving, or sooner if symptoms become worse. Precautions discussed. Red flags discussed. Questions invited and answered. Patient voiced understanding and agreement.     Jacqulyn Cane, MD 11/29/13 2007

## 2013-12-03 ENCOUNTER — Other Ambulatory Visit: Payer: Self-pay | Admitting: Hematology and Oncology

## 2013-12-11 ENCOUNTER — Telehealth: Payer: Self-pay | Admitting: *Deleted

## 2013-12-11 NOTE — Telephone Encounter (Signed)
[facility] papers were faxed today. Sent for scan. Margette Fast, CMA

## 2013-12-14 ENCOUNTER — Ambulatory Visit (INDEPENDENT_AMBULATORY_CARE_PROVIDER_SITE_OTHER): Payer: 59 | Admitting: Physician Assistant

## 2013-12-14 ENCOUNTER — Encounter: Payer: Self-pay | Admitting: Physician Assistant

## 2013-12-14 VITALS — BP 140/88 | HR 86 | Temp 97.8°F | Ht 64.0 in | Wt 230.0 lb

## 2013-12-14 DIAGNOSIS — J45901 Unspecified asthma with (acute) exacerbation: Secondary | ICD-10-CM

## 2013-12-14 DIAGNOSIS — R062 Wheezing: Secondary | ICD-10-CM

## 2013-12-14 MED ORDER — AZITHROMYCIN 250 MG PO TABS: ORAL_TABLET | ORAL | Status: DC

## 2013-12-14 MED ORDER — FLUCONAZOLE 150 MG PO TABS: 150.0000 mg | ORAL_TABLET | Freq: Once | ORAL | Status: DC

## 2013-12-14 MED ORDER — PREDNISONE 50 MG PO TABS
ORAL_TABLET | ORAL | Status: DC
Start: 2013-12-14 — End: 2014-03-19

## 2013-12-14 MED ORDER — IPRATROPIUM-ALBUTEROL 0.5-2.5 (3) MG/3ML IN SOLN
3.0000 mL | Freq: Once | RESPIRATORY_TRACT | Status: AC
  Administered 2013-12-14: 3 mL via RESPIRATORY_TRACT

## 2013-12-14 MED ORDER — ALBUTEROL SULFATE 108 (90 BASE) MCG/ACT IN AEPB: 2.0000 | INHALATION_SPRAY | RESPIRATORY_TRACT | Status: DC | PRN

## 2013-12-14 MED ORDER — BENZONATATE 200 MG PO CAPS: 200.0000 mg | ORAL_CAPSULE | Freq: Two times a day (BID) | ORAL | Status: DC | PRN

## 2013-12-14 NOTE — Progress Notes (Signed)
   Subjective:    Patient ID: Jamie Mccarthy, female    DOB: 03/17/1975, 38 y.o.   MRN: [id number]  HPI Pt presents to the clinic with persistent dry to productive cough for last 2 1/2 weeks. Went to UC when started and they told her viral. She denies any fever, chills, ear pain, ST, sinus pressure. She just has wheezing cough that leaves her out of breath. She also has some right sided chest pain with deep breathing. Taking OTC nyquil and dayquil with no relief. No hx of asthma.    Review of Systems  All other systems reviewed and are negative.      Objective:   Physical Exam  Constitutional: She is oriented to person, place, and time. She appears well-developed and well-nourished.  HENT:  Head: Normocephalic and atraumatic.  Right Ear: External ear normal.  Left Ear: External ear normal.  Nose: Nose normal.  Mouth/Throat: Oropharynx is clear and moist.  Eyes: Conjunctivae are normal. Right eye exhibits no discharge. Left eye exhibits no discharge.  Neck: Normal range of motion. Neck supple.  Cardiovascular: Normal rate, regular rhythm and normal heart sounds.   Pulmonary/Chest:  Bilateral lung wheezing.  No rhonchi.  Coughing with all deep breaths.    Lymphadenopathy:    She has no cervical adenopathy.  Neurological: She is alert and oriented to person, place, and time.  Skin: Skin is dry.  Psychiatric: She has a normal mood and affect. Her behavior is normal.          Assessment & Plan:  Asthmatic bronchitis- peak flows low yellow range that sent pt into coughing spell. duoneb given. Albuterol inhaler sent home as needed every 4-6 hours. Prednisone for 5 days. zpak for 5 days. HO given. Tessalon for cough. Follow up as needed.

## 2014-02-04 ENCOUNTER — Other Ambulatory Visit: Payer: Self-pay | Admitting: Physician Assistant

## 2014-03-18 ENCOUNTER — Other Ambulatory Visit: Payer: Self-pay | Admitting: Family Medicine

## 2014-03-19 ENCOUNTER — Ambulatory Visit (INDEPENDENT_AMBULATORY_CARE_PROVIDER_SITE_OTHER): Payer: 59 | Admitting: Sports Medicine

## 2014-03-19 ENCOUNTER — Encounter: Payer: Self-pay | Admitting: Sports Medicine

## 2014-03-19 DIAGNOSIS — N926 Irregular menstruation, unspecified: Secondary | ICD-10-CM

## 2014-03-19 MED ORDER — ETONOGESTREL-ETHINYL ESTRADIOL 0.12-0.015 MG/24HR VA RING: VAGINAL_RING | VAGINAL | Status: DC

## 2014-03-19 MED ORDER — TOPIRAMATE 100 MG PO TABS: 100.0000 mg | ORAL_TABLET | Freq: Every day | ORAL | Status: DC

## 2014-03-19 MED ORDER — HYDROXYZINE HCL 25 MG PO TABS: 25.0000 mg | ORAL_TABLET | Freq: Three times a day (TID) | ORAL | Status: DC | PRN

## 2014-03-19 MED ORDER — SUMATRIPTAN SUCCINATE 100 MG PO TABS: 100.0000 mg | ORAL_TABLET | ORAL | Status: DC | PRN

## 2014-03-19 NOTE — Progress Notes (Signed)
  Subjective:    CC: "migraines"  HPI:  Patient presents with complaint of migraine since yesterday. She is known to have migraines w/ visual aura that have been well controlled with Topamax and Imitrex however she has run out of her medications yesterday and her migraine has returned. Her migraines are associated with her meses, she estimates that she experiences migraines about 2 times per month. She also requests refill of Wellbutrin, and would like to discuss hormonal contraception to control her heavy bleeding. Additionally she has had intermittent skin itching for the past year- this is not associated with rash. The patient denies any new lotions or detergents. She denies rhinorrhea, lacrimation, fever, chills, nausea, vomiting, RUQ pain, yellowing of skin or eyes. She reports that she has been under increased stress at work over the past year.  Past medical history, Surgical history, Family history not pertinant except as noted below, Social history, Allergies, and medications have been entered into the medical record, reviewed, and no changes needed.   Review of Systems: No fevers, chills, night sweats, weight loss, chest pain, or shortness of breath.   Objective:    General: Well Developed, well nourished, and in no acute distress.  Neuro: Alert and oriented x3, extra-ocular muscles intact, sensation grossly intact.  HEENT: Normocephalic, atraumatic, pupils equal round reactive to light, sclerae anicteric, neck supple, no masses, no lymphadenopathy, thyroid nonpalpable.  Skin: Warm and dry, no rashes. Cardiac: Regular rate and rhythm, no murmurs rubs or gallops, no lower extremity edema.  Respiratory: Clear to auscultation bilaterally. Not using accessory muscles, speaking in full sentences.   Impression and Recommendations:    # Migraine - Refill sumatriptan 100 mg - Increase topiramate to 100 mg daily - Begin NuvaRing for likely catamenial relationship of migraine  #  Itching - Pt with 1 year of intermittent itching not associated with rash, cannot r/o hyperbilirubinemia or allergic causes at this time - Begin Hydroxyzine 25 mg TID  - Check LFTs to r/o hyperbilirubinemia  # Menorrhagia - Patient with known history of fibroids - Will check CBC - Begin NuvaRing to reduce menstrual bleeding - Pt to follow up with her OB/GYN   Follow up with PCP in 8 weeks

## 2014-03-19 NOTE — Assessment & Plan Note (Signed)
Refilling sumatriptan, increasing topiramate. Adding birth control as she does seem to have catamenial migraines. She also has some menorrhagia which will improve with birth control, prefers NuvaRing. She is post bilateral tubal ligation. She also has a history of fibroids. She will follow-up with her PCP for all of this.

## 2014-03-22 ENCOUNTER — Telehealth: Payer: Self-pay

## 2014-03-22 NOTE — Telephone Encounter (Signed)
Jamie Mccarthy reports having nausea, vomiting, numbness and tingling and believes it is a side effect of the Topamax. Reviewed with Dr Madilyn Fireman and she advised that the numbness and tingling is normal and to follow up in a few days if symptoms persist or worsen.

## 2014-04-14 ENCOUNTER — Encounter: Payer: Self-pay | Admitting: *Deleted

## 2014-04-14 ENCOUNTER — Emergency Department (INDEPENDENT_AMBULATORY_CARE_PROVIDER_SITE_OTHER)
Admission: EM | Admit: 2014-04-14 | Discharge: 2014-04-14 | Disposition: A | Discharge status: Home / Self Care | Payer: 59 | Source: Home / Self Care | Attending: Family Medicine | Admitting: Family Medicine

## 2014-04-14 ENCOUNTER — Encounter: Payer: 59 | Admitting: Family Medicine

## 2014-04-14 DIAGNOSIS — R110 Nausea: Secondary | ICD-10-CM | POA: Diagnosis not present

## 2014-04-14 DIAGNOSIS — G43109 Migraine with aura, not intractable, without status migrainosus: Secondary | ICD-10-CM

## 2014-04-14 MED ORDER — ONDANSETRON 4 MG PO TBDP
4.0000 mg | ORAL_TABLET | Freq: Once | ORAL | Status: AC
  Administered 2014-04-14: 4 mg via ORAL

## 2014-04-14 MED ORDER — ONDANSETRON 4 MG PO TBDP: 4.0000 mg | ORAL_TABLET | Freq: Three times a day (TID) | ORAL | Status: DC | PRN

## 2014-04-14 MED ORDER — BUTALBITAL-APAP-CAFFEINE 50-325-40 MG PO TABS: 1.0000 | ORAL_TABLET | Freq: Four times a day (QID) | ORAL | Status: DC | PRN

## 2014-04-14 NOTE — Progress Notes (Signed)
   Subjective:    Patient ID: Jamie Mccarthy, female    DOB: 1975-06-13, 39 y.o.   MRN: [id number]  HPI  Review of Systems     Objective:   Physical Exam          Assessment & Plan:  Chart entered in error   Patient has been deemed a "no-show" for today's scheduled appointment.  Based on chief complaint and my chart review: -No follow-up necessary.   If necessary this was communicated to the patient via phone or mail on: 6:26 PM 04/16/2014

## 2014-04-14 NOTE — ED Notes (Signed)
Pt c/o migraine, nausea and vomiting x this morning. No OTC meds. She also c/o seasonal allergies for a while.

## 2014-04-14 NOTE — ED Provider Notes (Signed)
CSN: [id number]     Arrival date & time 04/14/14  0904 History   First MD Initiated Contact with Patient 04/14/14 [id number]     Chief Complaint  Patient presents with  . Migraine  . Allergies      HPI Comments: Patient awoke this morning with a typical left temporal and frontal migraine headache, nausea, and one episode of vomiting.  She has a history of migraines that have responded somewhat to Imitrex but she has run out.  Two months ago she was started on Topamax 100mg  daily which has not decreased her frequency of headaches.  No neurologic symptoms. She has had a rash after taking naproxen.  She reports that she has had no response in the past to Midrin.  Patient is a 39 y.o. female presenting with migraines. The history is provided by the patient.  Migraine This is a recurrent problem. The current episode started 3 to 5 hours ago. The problem occurs constantly. The problem has not changed since onset.Associated symptoms include headaches. Associated symptoms comments: nausea. Exacerbated by: light and movement. Nothing relieves the symptoms. She has tried nothing for the symptoms.    Past Medical History  Diagnosis Date  . PONV (postoperative nausea and vomiting)     has had PONV with previous c/s and also lap band surgery 2004  . GERD (gastroesophageal reflux disease)     diet controlled  . Anemia     Has sickle cell trait  . Iron deficiency anemia, unspecified 10/20/2012  . Hypertension   . Gestational diabetes   . Headache(784.0)   . Herpes 2007    hx of  . Seasonal allergies   . H/O vaginal delivery 2010   Past Surgical History  Procedure Laterality Date  . Laparoscopic gastric banding  2005  . Cesarean section  2012  . Cesarean section N/A 07/16/2012    Procedure: REPEAT CESAREAN SECTION;  Surgeon: Logan Bores, MD;  Location: [facility];  Service: Obstetrics;  Laterality: N/A;  . Bilateral salpingectomy  07/16/2012    Procedure: BILATERAL DISTAL SALPINGECTOMY;  Surgeon:  Logan Bores, MD;  Location: [facility];  Service: Obstetrics;;  . Reposition gastric band  2006    x2  . Wisdom tooth extraction  1999  . Tubal ligation  2014  . Gastric banding port revision N/A 11/14/2012    Procedure: GASTRIC BANDING PORT REVISION;  Surgeon: Gayland Curry, MD;  Location: [facility];  Service: General;  Laterality: N/A;  PORT PLACEMENT MOVED NEW MESH INSERTED   . Cholecystectomy N/A 11/14/2012    Procedure: LAPAROSCOPIC CHOLECYSTECTOMY;  Surgeon: Gayland Curry, MD;  Location: [facility];  Service: General;  Laterality: N/A;   Family History  Problem Relation Age of Onset  . Diabetes      father's family  . Breast cancer Maternal Grandmother   . Hypertension Mother   . Thyroid cancer     History  Substance Use Topics  . Smoking status: Never Smoker   . Smokeless tobacco: Never Used  . Alcohol Use: No   OB History    Gravida Para Term Preterm AB TAB SAB Ectopic Multiple Living   3 3 2 1  0 0 0 0 1 4     Review of Systems  Constitutional: Positive for chills and fatigue. Negative for fever.  Neurological: Positive for dizziness and headaches. Negative for seizures, syncope, facial asymmetry, speech difficulty, weakness and numbness.  All other systems reviewed and are negative.   Allergies  Latex  and Naproxen  Home Medications   Prior to Admission medications   Medication Sig Start Date End Date Taking? Authorizing Provider  Albuterol Sulfate (PROAIR RESPICLICK) 734 (90 BASE) MCG/ACT AEPB Inhale 2 puffs into the lungs every 4 (four) hours as needed. 12/14/13   Jade L Breeback, PA-C  buPROPion (WELLBUTRIN XL) 150 MG 24 hr tablet Take 1 tablet (150 mg total) by mouth every morning. 09/25/13 09/25/14  Hali Marry, MD  butalbital-acetaminophen-caffeine (FIORICET) 305 127 0452 MG per tablet Take 1 tablet by mouth every 6 (six) hours as needed for headache. 04/14/14 04/14/15  Kandra Nicolas, MD  desonide (DESONATE) 0.05 % gel Apply topically 2 (two) times daily.  10/12/13   Donella Stade, PA-C  etonogestrel-ethinyl estradiol (NUVARING) 0.12-0.015 MG/24HR vaginal ring Insert vaginally and leave in place for 3 consecutive weeks, then remove for 1 week. 03/19/14   Silverio Decamp, MD  ferrous sulfate 325 (65 FE) MG tablet Take 325 mg by mouth daily with breakfast.    Historical Provider, MD  hydrOXYzine (ATARAX/VISTARIL) 25 MG tablet Take 1 tablet (25 mg total) by mouth 3 (three) times daily as needed for itching. For itching. 03/19/14   Silverio Decamp, MD  LORazepam (ATIVAN) 1 MG tablet Take 0.5 tablets (0.5 mg total) by mouth every 8 (eight) hours as needed for anxiety. 09/20/13   Mercedes Camprubi-Soms, PA-C  ondansetron (ZOFRAN ODT) 4 MG disintegrating tablet Take 1 tablet (4 mg total) by mouth every 8 (eight) hours as needed for nausea or vomiting. Dissolve under tongue 04/14/14   Kandra Nicolas, MD  Ondansetron HCl (ZOFRAN PO) Take by mouth.    Historical Provider, MD  SUMAtriptan (IMITREX) 100 MG tablet Take 1 tablet (100 mg total) by mouth every 2 (two) hours as needed for migraine or headache. May repeat in 2 hours if headache persists or recurs. 03/19/14   Silverio Decamp, MD  topiramate (TOPAMAX) 100 MG tablet Take 1 tablet (100 mg total) by mouth daily. 03/19/14   Silverio Decamp, MD   BP 153/98 mmHg  Pulse 92  Temp(Src) 98.3 F (36.8 C) (Oral)  Resp 18  Ht 5\' 4"  (1.626 m)  Wt 229 lb (103.874 kg)  BMI 39.29 kg/m2  SpO2 100%  LMP 03/07/2014 Physical Exam Nursing notes and Vital Signs reviewed. Appearance:  Patient appears stated age, and in no acute distress.  Patient is obese (BMI 39.3) Eyes:  Pupils are equal, round, and reactive to light and accomodation.  Extraocular movement is intact.  Conjunctivae are not inflamed.  Fundi benign.  No photophobia  Ears:  Canals normal.  Tympanic membranes normal.  Nose:   Normal turbinates.  No sinus tenderness.   Pharynx:  Normal Neck:  Supple.  No adenopathy or  thyromegaly Lungs:  Clear to auscultation.  Breath sounds are equal.  Heart:  Regular rate and rhythm without murmurs, rubs, or gallops.  Abdomen:  Nontender without masses or hepatosplenomegaly.  Bowel sounds are present.  No CVA or flank tenderness.  Extremities:  No edema.  No calf tenderness Skin:  No rash present.  Neurologic:  Cranial nerves 2 through 12 are normal.  Patellar, achilles, and elbow reflexes are normal.  Cerebellar function is intact (finger-to-nose and rapid alternating hand movement).  Gait and station are normal.     ED Course  Procedures  none   MDM   1. Migraine with aura and without status migrainosus, not intractable   2. Nausea without vomiting    Zofran ODT  4mg  administered.  Rx for same.  Trial of Fioricet for present headache. Begin clear liquids for 6 to12 hours, then gradually advance diet as nausea resolves.  If symptoms become significantly worse during the night or over the weekend, proceed to the local emergency room.  Followup with Family Doctor in one week.  Patient has had poor response from Topamax; consider trial of propranolol.    Kandra Nicolas, MD 04/14/14 [id number]

## 2014-04-14 NOTE — Discharge Instructions (Signed)
Begin clear liquids for 6 to12 hours, then gradually advance diet as nausea resolves.  If symptoms become significantly worse during the night or over the weekend, proceed to the local emergency room.    Migraine Headache A migraine headache is an intense, throbbing pain on one or both sides of your head. A migraine can last for 30 minutes to several hours. CAUSES  The exact cause of a migraine headache is not always known. However, a migraine may be caused when nerves in the brain become irritated and release chemicals that cause inflammation. This causes pain. Certain things may also trigger migraines, such as:  Alcohol.  Smoking.  Stress.  Menstruation.  Aged cheeses.  Foods or drinks that contain nitrates, glutamate, aspartame, or tyramine.  Lack of sleep.  Chocolate.  Caffeine.  Hunger.  Physical exertion.  Fatigue.  Medicines used to treat chest pain (nitroglycerine), birth control pills, estrogen, and some blood pressure medicines. SIGNS AND SYMPTOMS  Pain on one or both sides of your head.  Pulsating or throbbing pain.  Severe pain that prevents daily activities.  Pain that is aggravated by any physical activity.  Nausea, vomiting, or both.  Dizziness.  Pain with exposure to bright lights, loud noises, or activity.  General sensitivity to bright lights, loud noises, or smells. Before you get a migraine, you may get warning signs that a migraine is coming (aura). An aura may include:  Seeing flashing lights.  Seeing bright spots, halos, or zigzag lines.  Having tunnel vision or blurred vision.  Having feelings of numbness or tingling.  Having trouble talking.  Having muscle weakness. DIAGNOSIS  A migraine headache is often diagnosed based on:  Symptoms.  Physical exam.  A CT scan or MRI of your head. These imaging tests cannot diagnose migraines, but they can help rule out other causes of headaches. TREATMENT Medicines may be given for  pain and nausea. Medicines can also be given to help prevent recurrent migraines.  HOME CARE INSTRUCTIONS  Only take over-the-counter or prescription medicines for pain or discomfort as directed by your health care provider. The use of long-term narcotics is not recommended.  Lie down in a dark, quiet room when you have a migraine.  Keep a journal to find out what may trigger your migraine headaches. For example, write down:  What you eat and drink.  How much sleep you get.  Any change to your diet or medicines.  Limit alcohol consumption.  Quit smoking if you smoke.  Get 7-9 hours of sleep, or as recommended by your health care provider.  Limit stress.  Keep lights dim if bright lights bother you and make your migraines worse. SEEK IMMEDIATE MEDICAL CARE IF:   Your migraine becomes severe.  You have a fever.  You have a stiff neck.  You have vision loss.  You have muscular weakness or loss of muscle control.  You start losing your balance or have trouble walking.  You feel faint or pass out.  You have severe symptoms that are different from your first symptoms. MAKE SURE YOU:   Understand these instructions.  Will watch your condition.  Will get help right away if you are not doing well or get worse. Document Released: 01/08/2005 Document Revised: 05/25/2013 Document Reviewed: 09/15/2012 [facility] Patient Information 2015 [place], [place]. This information is not intended to replace advice given to you by your health care provider. Make sure you discuss any questions you have with your health care provider.

## 2014-05-12 ENCOUNTER — Ambulatory Visit: Payer: 59 | Admitting: Family Medicine

## 2014-05-14 ENCOUNTER — Ambulatory Visit: Payer: 59 | Admitting: Family Medicine

## 2014-05-24 ENCOUNTER — Telehealth: Payer: Self-pay | Admitting: *Deleted

## 2014-05-24 NOTE — Telephone Encounter (Signed)
Pt informed.Jamie Mccarthy  

## 2014-05-24 NOTE — Telephone Encounter (Signed)
[facility] forms faxed,confirmation received, and placed in scan. Maryruth Eve, [name]

## 2014-06-05 ENCOUNTER — Other Ambulatory Visit: Payer: Self-pay | Admitting: Family Medicine

## 2014-06-14 ENCOUNTER — Ambulatory Visit: Payer: 59 | Admitting: Family Medicine

## 2014-06-17 ENCOUNTER — Encounter: Payer: Self-pay | Admitting: Family Medicine

## 2014-06-17 ENCOUNTER — Ambulatory Visit (INDEPENDENT_AMBULATORY_CARE_PROVIDER_SITE_OTHER): Payer: 59 | Admitting: Family Medicine

## 2014-06-17 VITALS — BP 122/78 | HR 79 | Wt 222.0 lb

## 2014-06-17 DIAGNOSIS — J0100 Acute maxillary sinusitis, unspecified: Secondary | ICD-10-CM | POA: Diagnosis not present

## 2014-06-17 DIAGNOSIS — R635 Abnormal weight gain: Secondary | ICD-10-CM

## 2014-06-17 DIAGNOSIS — F329 Major depressive disorder, single episode, unspecified: Secondary | ICD-10-CM | POA: Diagnosis not present

## 2014-06-17 DIAGNOSIS — Z6838 Body mass index (BMI) 38.0-38.9, adult: Secondary | ICD-10-CM

## 2014-06-17 DIAGNOSIS — N926 Irregular menstruation, unspecified: Secondary | ICD-10-CM

## 2014-06-17 DIAGNOSIS — F32A Depression, unspecified: Secondary | ICD-10-CM

## 2014-06-17 DIAGNOSIS — Z9884 Bariatric surgery status: Secondary | ICD-10-CM

## 2014-06-17 MED ORDER — BUPROPION HCL ER (XL) 150 MG PO TB24: 150.0000 mg | ORAL_TABLET | Freq: Every morning | ORAL | Status: DC

## 2014-06-17 MED ORDER — LIRAGLUTIDE -WEIGHT MANAGEMENT 18 MG/3ML ~~LOC~~ SOPN: 0.6000 mg | PEN_INJECTOR | Freq: Every day | SUBCUTANEOUS | Status: DC

## 2014-06-17 MED ORDER — AZITHROMYCIN 250 MG PO TABS: ORAL_TABLET | ORAL | Status: DC

## 2014-06-17 NOTE — Progress Notes (Signed)
   Subjective:    Patient ID: Jamie Mccarthy, female    DOB: December 09, 1975, 39 y.o.   MRN: [id number]  HPI Headaches have been  Back with a vengence since last August.  Says her migraines had been better for several years until last Patriciann. She is on topamax and uses imitrex PRN. Getting 6 HA per months.  They are significant.     Having several weeks of Severe nasal congestion and no relief with zyrtec and nasal spray.  Waking up with 3 AM and can't go back to sleep for about an hour. bilat maxillary pain. No fever, chills, sweats.   Depression - Doing ok with wellbutrin. Says has been more down bvc of lack of sleep.  S really her anxiety seems to be what is really high. She says she feels nervous and on edge nearly every day and has difficult controlling her worrying area she has difficulty relaxing more than half the days and complains of being easily annoyed and irritable nearly every day. She rates her symptoms is somewhat difficult. She complains of feeling down more than half the days. She rates sleep difficulty as 3, affecting her nearly every day as well as overeating and increased appetite.  Review of Systems     Objective:   Physical Exam  Constitutional: She is oriented to person, place, and time. She appears well-developed and well-nourished.  HENT:  Head: Normocephalic and atraumatic.  Right Ear: External ear normal.  Left Ear: External ear normal.  Nose: Nose normal.  Mouth/Throat: Oropharynx is clear and moist.  TMs and canals are clear. Dark circles under her eyes  Eyes: Conjunctivae and EOM are normal. Pupils are equal, round, and reactive to light.  Neck: Neck supple. No thyromegaly present.  Cardiovascular: Normal rate, regular rhythm and normal heart sounds.   Pulmonary/Chest: Effort normal and breath sounds normal. She has no wheezes.  Lymphadenopathy:    She has no cervical adenopathy.  Neurological: She is alert and oriented to person, place, and time.  Skin: Skin  is warm and dry.  Psychiatric: She has a normal mood and affect.          Assessment & Plan:  BMI 38 - She really wants a medication to help her with appetite.  Discussed options.  Will start saxenda. discused potential S.E.  F/U in 6 weeks.   Migraine HA- she's actually going to be discontinuing her Topamax. She will have to have an 8 week washout period and then she will start as able to participate in an investigational study for prophylactic medication. She will just be using her Imitrex for rescue.  Depression - PHQ- 9 score of 11 and GAD- 7 dcore of 15. Not well controlled.  Refilled her Wellbutrin today. F/U in 6 weeks. If stillnot well controlled and if sleeping better then can try inc dose of medication.    Acute sinusitis - treat with azithromycin. Call if not significantly better in one week.

## 2014-07-30 ENCOUNTER — Ambulatory Visit: Payer: 59 | Admitting: Family Medicine

## 2014-08-18 ENCOUNTER — Encounter: Payer: Self-pay | Admitting: Family Medicine

## 2014-08-18 ENCOUNTER — Ambulatory Visit (INDEPENDENT_AMBULATORY_CARE_PROVIDER_SITE_OTHER): Payer: 59 | Admitting: Family Medicine

## 2014-08-18 VITALS — BP 141/97 | HR 67 | Wt 219.0 lb

## 2014-08-18 DIAGNOSIS — S338XXA Sprain of other parts of lumbar spine and pelvis, initial encounter: Secondary | ICD-10-CM

## 2014-08-18 DIAGNOSIS — S39012A Strain of muscle, fascia and tendon of lower back, initial encounter: Secondary | ICD-10-CM | POA: Insufficient documentation

## 2014-08-18 MED ORDER — TRAMADOL HCL 50 MG PO TABS: 50.0000 mg | ORAL_TABLET | Freq: Three times a day (TID) | ORAL | Status: DC | PRN

## 2014-08-18 MED ORDER — CYCLOBENZAPRINE HCL 5 MG PO TABS: 5.0000 mg | ORAL_TABLET | Freq: Every evening | ORAL | Status: DC | PRN

## 2014-08-18 MED ORDER — IBUPROFEN 800 MG PO TABS: 800.0000 mg | ORAL_TABLET | Freq: Three times a day (TID) | ORAL | Status: DC | PRN

## 2014-08-18 NOTE — Patient Instructions (Signed)
Thank you for coming in today. Come back or go to the emergency room if you notice new weakness new numbness problems walking or bowel or bladder problems.  Lumbosacral Strain Lumbosacral strain is a strain of any of the parts that make up your lumbosacral vertebrae. Your lumbosacral vertebrae are the bones that make up the lower third of your backbone. Your lumbosacral vertebrae are held together by muscles and tough, fibrous tissue (ligaments).  CAUSES  A sudden blow to your back can cause lumbosacral strain. Also, anything that causes an excessive stretch of the muscles in the low back can cause this strain. This is typically seen when people exert themselves strenuously, fall, lift heavy objects, bend, or crouch repeatedly. RISK FACTORS  Physically demanding work.  Participation in pushing or pulling sports or sports that require a sudden twist of the back (tennis, golf, baseball).  Weight lifting.  Excessive lower back curvature.  Forward-tilted pelvis.  Weak back or abdominal muscles or both.  Tight hamstrings. SIGNS AND SYMPTOMS  Lumbosacral strain may cause pain in the area of your injury or pain that moves (radiates) down your leg.  DIAGNOSIS Your health care provider can often diagnose lumbosacral strain through a physical exam. In some cases, you may need tests such as X-ray exams.  TREATMENT  Treatment for your lower back injury depends on many factors that your clinician will have to evaluate. However, most treatment will include the use of anti-inflammatory medicines. HOME CARE INSTRUCTIONS   Avoid hard physical activities (tennis, racquetball, waterskiing) if you are not in proper physical condition for it. This may aggravate or create problems.  If you have a back problem, avoid sports requiring sudden body movements. Swimming and walking are generally safer activities.  Maintain good posture.  Maintain a healthy weight.  For acute conditions, you may put ice on  the injured area.  Put ice in a plastic bag.  Place a towel between your skin and the bag.  Leave the ice on for 20 minutes, 2-3 times a day.  When the low back starts healing, stretching and strengthening exercises may be recommended. SEEK MEDICAL CARE IF:  Your back pain is getting worse.  You experience severe back pain not relieved with medicines. SEEK IMMEDIATE MEDICAL CARE IF:   You have numbness, tingling, weakness, or problems with the use of your arms or legs.  There is a change in bowel or bladder control.  You have increasing pain in any area of the body, including your belly (abdomen).  You notice shortness of breath, dizziness, or feel faint.  You feel sick to your stomach (nauseous), are throwing up (vomiting), or become sweaty.  You notice discoloration of your toes or legs, or your feet get very cold. MAKE SURE YOU:   Understand these instructions.  Will watch your condition.  Will get help right away if you are not doing well or get worse. Document Released: 10/18/2004 Document Revised: 01/13/2013 Document Reviewed: 08/27/2012 [facility] Patient Information 2015 [place], [place]. This information is not intended to replace advice given to you by your health care provider. Make sure you discuss any questions you have with your health care provider.

## 2014-08-18 NOTE — Progress Notes (Signed)
Jamie Mccarthy is a 39 y.o. female who presents to [facility]  today for back pain. Patient is left-sided low back pain present for 3 days. Patient denies any injury. Pain radiates from low back to the mid thoracic back. She denies any pain radiating down her legs. No weakness numbness bowel bladder dysfunction or difficulty walking. No fevers chills nausea vomiting or diarrhea. She has tried ibuprofen which has helped a little. She has trouble going to work because of her pain.   Past Medical History  Diagnosis Date  . PONV (postoperative nausea and vomiting)     has had PONV with previous c/s and also lap band surgery 2004  . GERD (gastroesophageal reflux disease)     diet controlled  . Anemia     Has sickle cell trait  . Iron deficiency anemia, unspecified 10/20/2012  . Hypertension   . Gestational diabetes   . Headache(784.0)   . Herpes 2007    hx of  . Seasonal allergies   . H/O vaginal delivery 2010   Past Surgical History  Procedure Laterality Date  . Laparoscopic gastric banding  2005  . Cesarean section  2012  . Cesarean section N/A 07/16/2012    Procedure: REPEAT CESAREAN SECTION;  Surgeon: Logan Bores, MD;  Location: [facility];  Service: Obstetrics;  Laterality: N/A;  . Bilateral salpingectomy  07/16/2012    Procedure: BILATERAL DISTAL SALPINGECTOMY;  Surgeon: Logan Bores, MD;  Location: [facility];  Service: Obstetrics;;  . Reposition gastric band  2006    x2  . Wisdom tooth extraction  1999  . Tubal ligation  2014  . Gastric banding port revision N/A 11/14/2012    Procedure: GASTRIC BANDING PORT REVISION;  Surgeon: Gayland Curry, MD;  Location: [facility];  Service: General;  Laterality: N/A;  PORT PLACEMENT MOVED NEW MESH INSERTED   . Cholecystectomy N/A 11/14/2012    Procedure: LAPAROSCOPIC CHOLECYSTECTOMY;  Surgeon: Gayland Curry, MD;  Location: [facility];  Service: General;  Laterality: N/A;   History  Substance Use Topics  .  Smoking status: Never Smoker   . Smokeless tobacco: Never Used  . Alcohol Use: No   ROS as above Medications: Current Outpatient Prescriptions  Medication Sig Dispense Refill  . azithromycin (ZITHROMAX) 250 MG tablet 2 tabs on Day 1, then one a day x 4 days. 6 tablet 0  . buPROPion (WELLBUTRIN XL) 150 MG 24 hr tablet Take 1 tablet (150 mg total) by mouth every morning. 30 tablet 5  . cyclobenzaprine (FLEXERIL) 5 MG tablet Take 1 tablet (5 mg total) by mouth at bedtime as needed for muscle spasms. 20 tablet 0  . desonide (DESONATE) 0.05 % gel Apply topically 2 (two) times daily. 60 g 0  . etonogestrel-ethinyl estradiol (NUVARING) 0.12-0.015 MG/24HR vaginal ring Insert vaginally and leave in place for 3 consecutive weeks, then remove for 1 week. 1 each 12  . hydrOXYzine (ATARAX/VISTARIL) 25 MG tablet Take 1 tablet (25 mg total) by mouth 3 (three) times daily as needed for itching. For itching. 30 tablet 3  . ibuprofen (ADVIL,MOTRIN) 800 MG tablet Take 1 tablet (800 mg total) by mouth every 8 (eight) hours as needed. 30 tablet 0  . Liraglutide -Weight Management (SAXENDA) 18 MG/3ML SOPN Inject 0.6 mg into the skin daily. After one week, increase to 1.2 mg daily if tolerated. 1 pen 1  . ondansetron (ZOFRAN ODT) 4 MG disintegrating tablet Take 1 tablet (4 mg total)  by mouth every 8 (eight) hours as needed for nausea or vomiting. Dissolve under tongue 12 tablet 0  . SUMAtriptan (IMITREX) 100 MG tablet Take 1 tablet (100 mg total) by mouth every 2 (two) hours as needed for migraine or headache. May repeat in 2 hours if headache persists or recurs. 9 tablet 2  . topiramate (TOPAMAX) 100 MG tablet Take 1 tablet (100 mg total) by mouth daily. 90 tablet 3  . traMADol (ULTRAM) 50 MG tablet Take 1 tablet (50 mg total) by mouth every 8 (eight) hours as needed. 30 tablet 0   No current facility-administered medications for this visit.   Allergies  Allergen Reactions  . Latex Itching    Pt states she gets  severely itchy with latex  . Naproxen Rash     Exam:  BP 141/97 mmHg  Pulse 67  Wt 219 lb (99.338 kg) Gen: Well NAD HEENT: EOMI,  MMM Lungs: Normal work of breathing. CTABL Heart: RRR no MRG Abd: NABS, Soft. Nondistended, Nontender Exts: Brisk capillary refill, warm and well perfused.  Back: Nontender to midline. Tender palpation left SI joint. Normal range of motion. Lower extremity strength reflexes are intact and equal bilaterally. Sensation is intact throughout. Normal gait.  No results found for this or any previous visit (from the past 24 hour(s)). No results found.   Please see individual assessment and plan sections.

## 2014-08-18 NOTE — Assessment & Plan Note (Signed)
Lumbosacral strain with muscle spasms. She was ibuprofen and Flexeril tramadol. Refer to physical therapy. Work note provided. Return as needed.

## 2014-09-16 ENCOUNTER — Ambulatory Visit: Payer: 59

## 2014-09-16 ENCOUNTER — Ambulatory Visit: Payer: 59 | Attending: Family Medicine

## 2014-09-16 DIAGNOSIS — M546 Pain in thoracic spine: Secondary | ICD-10-CM | POA: Diagnosis present

## 2014-09-16 DIAGNOSIS — M545 Low back pain, unspecified: Secondary | ICD-10-CM

## 2014-09-16 DIAGNOSIS — M25659 Stiffness of unspecified hip, not elsewhere classified: Secondary | ICD-10-CM | POA: Insufficient documentation
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
## 2014-09-16 NOTE — Patient Instructions (Signed)
Hip Stretch  Put right ankle over left knee. Let right knee fall downward, but keep ankle in place. Feel the stretch in hip. May push down gently with hand to feel stretch. Hold 20____ seconds while counting out loud. Repeat with other leg. Repeat _3___ times. Do __3__ sessions per day.   Stretching: Piriformis (Supine)  Pull right knee toward opposite shoulder. Hold __20__ seconds. Relax. Repeat __3__ times per set. Do ____ sets per session. Do __3__ sessions per day.  HIP: Hamstrings - Short Sitting   Rest leg on raised surface. Keep knee straight. Lift chest. Hold _20__ seconds. __3_ reps per set, _3__ sets per day  Copyright  VHI. All rights reserved.   Knee to Chest   Lying supine, bend involved knee to chest hold 20 seconds, repeat  __3_ times. Repeat with other leg. Do __3_ times per day.   BACK: Child's Pose (Sciatica)   Sit in knee-chest position and reach arms forward. Separate knees for comfort. Hold position for _20__ breaths. Repeat _3__ times. Do __3_ times per day.  Copyright  VHI. All rights reserved.   Copyright  VHI. All rights reserved.  [facility] [address] [place], [place] [address] Phone # [phone] Fax [phone]

## 2014-09-21 ENCOUNTER — Ambulatory Visit: Payer: 59 | Admitting: Physical Therapy

## 2014-09-23 ENCOUNTER — Ambulatory Visit: Payer: 59 | Attending: Family Medicine | Admitting: Physical Therapy

## 2014-09-23 DIAGNOSIS — M546 Pain in thoracic spine: Secondary | ICD-10-CM | POA: Insufficient documentation

## 2014-09-23 DIAGNOSIS — M545 Low back pain: Secondary | ICD-10-CM | POA: Insufficient documentation

## 2014-09-23 DIAGNOSIS — M25659 Stiffness of unspecified hip, not elsewhere classified: Secondary | ICD-10-CM | POA: Insufficient documentation

## 2014-10-05 ENCOUNTER — Ambulatory Visit: Payer: 59

## 2014-10-05 DIAGNOSIS — M545 Low back pain, unspecified: Secondary | ICD-10-CM

## 2014-10-05 DIAGNOSIS — M546 Pain in thoracic spine: Secondary | ICD-10-CM

## 2014-10-05 DIAGNOSIS — M25659 Stiffness of unspecified hip, not elsewhere classified: Secondary | ICD-10-CM | POA: Diagnosis present

## 2014-10-05 NOTE — Therapy (Addendum)
[facility] [address] [place], [place], [address] Phone: [phone]   Fax:  [phone]  Physical Therapy Treatment  Patient Details  Name: Jamie Mccarthy MRN: [id number] Date of Birth: 03-23-1975 Referring Provider:  Gregor Hams, MD  Encounter Date: 10/05/2014      PT End of Session - 10/05/14 0926    Visit Number 2   Date for PT Re-Evaluation 11/11/14   PT Start Time 0846   PT Stop Time 0927   PT Time Calculation (min) 41 min   Activity Tolerance Patient tolerated treatment well   Behavior During Therapy [facility] for tasks assessed/performed      Past Medical History  Diagnosis Date  . PONV (postoperative nausea and vomiting)     has had PONV with previous c/s and also lap band surgery 2004  . GERD (gastroesophageal reflux disease)     diet controlled  . Anemia     Has sickle cell trait  . Iron deficiency anemia, unspecified 10/20/2012  . Hypertension   . Gestational diabetes   . Headache(784.0)   . Herpes 2007    hx of  . Seasonal allergies   . H/O vaginal delivery 2010    Past Surgical History  Procedure Laterality Date  . Laparoscopic gastric banding  2005  . Cesarean section  2012  . Cesarean section N/A 07/16/2012    Procedure: REPEAT CESAREAN SECTION;  Surgeon: Logan Bores, MD;  Location: [facility];  Service: Obstetrics;  Laterality: N/A;  . Bilateral salpingectomy  07/16/2012    Procedure: BILATERAL DISTAL SALPINGECTOMY;  Surgeon: Logan Bores, MD;  Location: [facility];  Service: Obstetrics;;  . Reposition gastric band  2006    x2  . Wisdom tooth extraction  1999  . Tubal ligation  2014  . Gastric banding port revision N/A 11/14/2012    Procedure: GASTRIC BANDING PORT REVISION;  Surgeon: Gayland Curry, MD;  Location: [facility];  Service: General;  Laterality: N/A;  PORT PLACEMENT MOVED NEW MESH INSERTED   . Cholecystectomy N/A 11/14/2012    Procedure: LAPAROSCOPIC CHOLECYSTECTOMY;   Surgeon: Gayland Curry, MD;  Location: [facility];  Service: General;  Laterality: N/A;    There were no vitals filed for this visit.  Visit Diagnosis:  Left-sided low back pain without sciatica  Bilateral thoracic back pain  Hip stiffness, unspecified laterality      Subjective Assessment - 10/05/14 0850    Subjective Pt with limited attendance with PT with 2 no-show appointments.  Pt reports limited compliance with HEP.   Currently in Pain? Yes   Pain Score 4    Pain Location Back   Pain Orientation Right;Left;Lower;Mid   Pain Descriptors / Indicators Aching   Pain Type Chronic pain   Pain Radiating Towards Rt buttock   Pain Onset More than a month ago   Pain Frequency Intermittent   Aggravating Factors  sitting at work, sitting too long, laying flat   Pain Relieving Factors change of position, pain medication                         [facility] Adult PT Treatment/Exercise - 10/05/14 0001    Exercises   Exercises Lumbar;Knee/Hip   Lumbar Exercises: Stretches   Active Hamstring Stretch 3 reps;20 seconds   Single Knee to Chest Stretch 3 reps;20 seconds   Single Knee to Chest Stretch Limitations diagonal knee to chest 3x20 seconds   Quadruped  Mid Back Stretch 3 reps;20 seconds   Quadruped Mid Back Stretch Limitations prayer stretch and lateral prayer stretch   Piriformis Stretch 3 reps;20 seconds  seated   Lumbar Exercises: Aerobic   Stationary Bike Level 1x 8 minutes   Manual Therapy   Manual Therapy Joint mobilization   Joint Mobilization PA mobs T6-L3 grade 3                  PT Short Term Goals - 10/05/14 2831    PT SHORT TERM GOAL #1   Title be independent in initial HEP   Time 4   Period Weeks   Status On-going  limited compliance   PT SHORT TERM GOAL #2   Title report a 30% reduction in LBP/thoracic pain with sitting for work   Time 4   Period Weeks   Status On-going           PT Long Term Goals - 09/16/14 1448    PT LONG TERM  GOAL #1   Title be independent in advanced HEP   Time 8   Period Weeks   Status New   PT LONG TERM GOAL #2   Title reduce FOTO to < or = to 31% limitation   Time 8   Period Weeks   PT LONG TERM GOAL #3   Title report a 70% reduction in LBP/thoracic pain with sitting at work   Time 8   Period Weeks   Status New   PT LONG TERM GOAL #4   Title sit for 50% longer before LBP begins   Time 8   Period Weeks   Status New               Plan - 10/05/14 5176    Clinical Impression Statement Pt has only had 1 session after evaluation.  Pt reports limited compliance with HEP.  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  Pt will not be sitting for long periods any more. Pt with continued pain with sitting.  Pt will beneift from skilled PT for flexibility, strength and pain management strategies.     Pt will benefit from skilled therapeutic intervention in order to improve on the following deficits Pain;Decreased activity tolerance;Hypermobility;Impaired flexibility   Rehab Potential Good   PT Frequency 2x / week   PT Duration 8 weeks   PT Treatment/Interventions ADLs/Self Care Home Management;Cryotherapy;Electrical Stimulation;Moist Heat;Therapeutic exercise;Therapeutic activities;Ultrasound;Neuromuscular re-education;Patient/family education;Taping;Passive range of motion;Manual techniques   PT Next Visit Plan PA mobs to thoracic and lumbar spine, modalities, hip flexibility, begin core strength.     Consulted and Agree with Plan of Care Patient        Problem List Patient Active Problem List   Diagnosis Date Noted  . Lumbosacral strain 08/18/2014  . Iron deficiency anemia, unspecified 10/20/2012  . H/O laparoscopic adjustable gastric banding 02/2003 10/02/2012  . Regurgitation 10/02/2012  . S/P repeat low transverse C-section 07/17/2012  . Sickle cell trait 12/10/2011  . HYPERTENSION 09/11/2010  . Allergic rhinitis  06/14/2010  . GERD 12/27/2008  . OBESITY NOS 12/03/2005  . 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 RHINITIS, ALLERGIC 10/30/2005    TAKACS,KELLY, PT 10/05/2014, 9:28 AM PHYSICAL THERAPY DISCHARGE SUMMARY  Visits from Start of Care: 2  Current functional level related to goals / functional outcomes: Pt attended 2 PT sessions with limited attendance due to no-show and cancel.  Pt cancelled all remaining appts due to change  in insurance.  Plan of care is now expired.     Remaining deficits: See above for most current status.  Pt didn't return so current status is not known.   Education / Equipment: HEP, Economist education Plan: Patient agrees to discharge.  Patient goals were not met. Patient is being discharged due to not returning since the last visit.  ?????    Sigurd Sos, PT 11/09/2014 7:21 AM [facility] [address], [place], [place], [address] Phone: [phone]   Fax:  [phone]

## 2014-10-07 ENCOUNTER — Encounter: Payer: 59 | Admitting: Physical Therapy

## 2014-10-13 ENCOUNTER — Ambulatory Visit: Payer: 59 | Admitting: Physical Therapy

## 2014-10-19 LAB — HM PAP SMEAR: HM Pap smear: NEGATIVE

## 2014-11-15 ENCOUNTER — Encounter: Payer: Self-pay | Admitting: Family Medicine

## 2014-11-17 ENCOUNTER — Telehealth: Payer: Self-pay

## 2014-11-17 MED ORDER — INSULIN PEN NEEDLE 31G X 8 MM MISC
Status: DC
Start: 2014-11-17 — End: 2015-10-12

## 2014-11-17 NOTE — Telephone Encounter (Signed)
Sent in pen needles 

## 2014-12-01 ENCOUNTER — Encounter: Payer: Self-pay | Admitting: Family Medicine

## 2014-12-01 ENCOUNTER — Ambulatory Visit (INDEPENDENT_AMBULATORY_CARE_PROVIDER_SITE_OTHER): Payer: 59 | Admitting: Family Medicine

## 2014-12-01 VITALS — BP 144/90 | HR 66 | Temp 98.3°F | Wt 212.0 lb

## 2014-12-01 DIAGNOSIS — J019 Acute sinusitis, unspecified: Secondary | ICD-10-CM

## 2014-12-01 DIAGNOSIS — F39 Unspecified mood [affective] disorder: Secondary | ICD-10-CM

## 2014-12-01 DIAGNOSIS — N926 Irregular menstruation, unspecified: Secondary | ICD-10-CM

## 2014-12-01 MED ORDER — SUMATRIPTAN SUCCINATE REFILL 6 MG/0.5ML ~~LOC~~ SOCT: 1.0000 | Freq: Every day | SUBCUTANEOUS | Status: DC | PRN

## 2014-12-01 NOTE — Patient Instructions (Signed)

## 2014-12-01 NOTE — Progress Notes (Signed)
   Subjective:    Patient ID: Jamie Mccarthy, female    DOB: [birth year], 39 y.o.   MRN: [id number]  HPI 1 week of congestion and sneezing. Taking OTC allergy medication. Clear discharge.  No fever, or chills. No ST. + fatigue. No ear pain or pressure. No ithcing in the throat.    Says she stopped her Wellbutrin and felt like it was making her "dazed"  Migraine headaches -  She's currently takes oral Imitrex but sometimes with her gastric banding surgery she has difficulty with the medication especially when she feels nauseated with her headaches. She had used Imitrex injection at one point in time it was very helpful and was much more effective. She would like to change back to that of possible.   Review of Systems     Objective:   Physical Exam  Constitutional: She is oriented to person, place, and time. She appears well-developed and well-nourished.  HENT:  Head: Normocephalic and atraumatic.  Right Ear: External ear normal.  Left Ear: External ear normal.  Nose: Nose normal.  Mouth/Throat: Oropharynx is clear and moist.  TMs and canals are clear.   Eyes: Conjunctivae and EOM are normal. Pupils are equal, round, and reactive to light.  Neck: Neck supple. No thyromegaly present.  Cardiovascular: Normal rate, regular rhythm and normal heart sounds.   Pulmonary/Chest: Effort normal and breath sounds normal. She has no wheezes.  Lymphadenopathy:    She has no cervical adenopathy.  Neurological: She is alert and oriented to person, place, and time.  Skin: Skin is warm and dry.  Psychiatric: She has a normal mood and affect.          Assessment & Plan:  Acute viral sinusitis.- likely viral. Call i not better after the weekend. Can call sooner if she feels like she's getting worse. The drainage is changing colors 2 bouts of fever or is getting increased pressure or headache. Next  Migraine headaches-she would really like to switch to the Imitrex stat dose instead of the oral  Imitrex. With her history of gastric surgery she has difficulty with pills sometimes and has used the statin dose before and says it's more effective for her.  Unspecified mood - she is off wellbutrin. Removed from the med list.

## 2014-12-03 ENCOUNTER — Telehealth: Payer: Self-pay | Admitting: Family Medicine

## 2014-12-03 MED ORDER — AZITHROMYCIN 250 MG PO TABS: ORAL_TABLET | ORAL | Status: AC

## 2014-12-03 NOTE — Telephone Encounter (Signed)
Called in an antibiotic to [facility] on [address].

## 2014-12-03 NOTE — Telephone Encounter (Signed)
Left detailed message on VM.

## 2014-12-03 NOTE — Telephone Encounter (Signed)
Dr Madilyn Fireman, Pt called. She's not feeling any better and wants script called in. Thank you.

## 2014-12-29 ENCOUNTER — Ambulatory Visit: Payer: 59 | Admitting: Family Medicine

## 2015-01-04 ENCOUNTER — Encounter: Payer: Self-pay | Admitting: Family Medicine

## 2015-01-04 ENCOUNTER — Ambulatory Visit (INDEPENDENT_AMBULATORY_CARE_PROVIDER_SITE_OTHER): Payer: 59 | Admitting: Family Medicine

## 2015-01-04 VITALS — BP 131/90 | HR 78 | Wt 206.0 lb

## 2015-01-04 DIAGNOSIS — IMO0001 Reserved for inherently not codable concepts without codable children: Secondary | ICD-10-CM

## 2015-01-04 DIAGNOSIS — R635 Abnormal weight gain: Secondary | ICD-10-CM | POA: Diagnosis not present

## 2015-01-04 DIAGNOSIS — Z6835 Body mass index (BMI) 35.0-35.9, adult: Secondary | ICD-10-CM

## 2015-01-04 DIAGNOSIS — R030 Elevated blood-pressure reading, without diagnosis of hypertension: Secondary | ICD-10-CM | POA: Diagnosis not present

## 2015-01-04 DIAGNOSIS — N924 Excessive bleeding in the premenopausal period: Secondary | ICD-10-CM

## 2015-01-04 DIAGNOSIS — Z23 Encounter for immunization: Secondary | ICD-10-CM

## 2015-01-04 MED ORDER — MEDROXYPROGESTERONE ACETATE 5 MG PO TABS: 5.0000 mg | ORAL_TABLET | Freq: Every day | ORAL | Status: DC

## 2015-01-04 MED ORDER — LIRAGLUTIDE -WEIGHT MANAGEMENT 18 MG/3ML ~~LOC~~ SOPN: 1.8000 mg | PEN_INJECTOR | Freq: Every day | SUBCUTANEOUS | Status: DC

## 2015-01-04 NOTE — Progress Notes (Signed)
Subjective:    Patient ID: Jamie Mccarthy, female    DOB: [birth year], 39 y.o.   MRN: [id number]  HPI She is doing well on the Saxenda. She's started with a 0.6 mg dose and then went up to the 1.2 mg dose. She's been on the medication for around 6-7 weeks.  Says the 1.2 mg dose is working well.  She started it in September.  Down 13 lbs.  Says not making her nausea worse.  Has had more loose bowels.  No CP or SOB.  No shakiness or dizziness.  Menorrhagia  - has been bleeding for several months. Has seen her GYN nad they are considering pap smear. Unfortunately, she has a new job and so will be able to take Aleve until probably about April. She needs to have been there for at least 6 months to take time off from work for surgery. She wants anything that she can take to decrease the bleeding. She started to get some irritation from wearing the pads daily and is worried that this may lead to a yeast infection which she is prone to.  Elevated BP - she has been keeping an eye on it. Has been up and down recently. She has had a family history of high blood pressure. Her mom has it.  Review of Systems  BP 131/90 mmHg  Pulse 78  Wt 206 lb (93.441 kg)  SpO2 98%    Allergies  Allergen Reactions  . Latex Itching    Pt states she gets severely itchy with latex  . Naproxen Rash    Past Medical History  Diagnosis Date  . PONV (postoperative nausea and vomiting)     has had PONV with previous c/s and also lap band surgery 2004  . GERD (gastroesophageal reflux disease)     diet controlled  . Anemia     Has sickle cell trait  . Iron deficiency anemia, unspecified 10/20/2012  . Hypertension   . Gestational diabetes   . Headache(784.0)   . Herpes 2007    hx of  . Seasonal allergies   . H/O vaginal delivery 2010    Past Surgical History  Procedure Laterality Date  . Laparoscopic gastric banding  2005  . Cesarean section  2012  . Cesarean section N/A 07/16/2012    Procedure: REPEAT CESAREAN  SECTION;  Surgeon: Logan Bores, MD;  Location: [facility];  Service: Obstetrics;  Laterality: N/A;  . Bilateral salpingectomy  07/16/2012    Procedure: BILATERAL DISTAL SALPINGECTOMY;  Surgeon: Logan Bores, MD;  Location: [facility];  Service: Obstetrics;;  . Reposition gastric band  2006    x2  . Wisdom tooth extraction  1999  . Tubal ligation  2014  . Gastric banding port revision N/A 11/14/2012    Procedure: GASTRIC BANDING PORT REVISION;  Surgeon: Gayland Curry, MD;  Location: [facility];  Service: General;  Laterality: N/A;  PORT PLACEMENT MOVED NEW MESH INSERTED   . Cholecystectomy N/A 11/14/2012    Procedure: LAPAROSCOPIC CHOLECYSTECTOMY;  Surgeon: Gayland Curry, MD;  Location: [facility];  Service: General;  Laterality: N/A;    Social History   Social History  . Marital Status: Married    Spouse Name: N/A  . Number of Children: 1  . Years of Education: N/A   Occupational History  . [employer]    Social History Main Topics  . Smoking status: Never Smoker   . Smokeless tobacco: Never Used  . Alcohol Use:  No  . Drug Use: No  . Sexual Activity: Yes     Comment: separated, 2 caffeine drinks daily.    Other Topics Concern  . Not on file   Social History Narrative   2 caffeine drinks per day    Family History  Problem Relation Age of Onset  . Diabetes      father's family  . Breast cancer Maternal Grandmother   . Hypertension Mother   . Thyroid cancer      Outpatient Encounter Prescriptions as of 01/04/2015  Medication Sig  . cyclobenzaprine (FLEXERIL) 5 MG tablet Take 1 tablet (5 mg total) by mouth at bedtime as needed for muscle spasms.  Marland Kitchen desonide (DESONATE) 0.05 % gel Apply topically 2 (two) times daily.  Marland Kitchen etonogestrel-ethinyl estradiol (NUVARING) 0.12-0.015 MG/24HR vaginal ring Insert vaginally and leave in place for 3 consecutive weeks, then remove for 1 week.  . Insulin Pen Needle 31G X 8 MM MISC Inject into skin daily.  . Liraglutide -Weight Management  (SAXENDA) 18 MG/3ML SOPN Inject 1.8 mg into the skin daily. After one week, increase to 1.2 mg daily if tolerated.  . ondansetron (ZOFRAN ODT) 4 MG disintegrating tablet Take 1 tablet (4 mg total) by mouth every 8 (eight) hours as needed for nausea or vomiting. Dissolve under tongue  . SUMAtriptan Succinate Refill (IMITREX STATDOSE REFILL) 6 MG/0.5ML SOCT Inject 1 Dose into the skin daily as needed.  . [DISCONTINUED] Liraglutide -Weight Management (SAXENDA) 18 MG/3ML SOPN Inject 0.6 mg into the skin daily. After one week, increase to 1.2 mg daily if tolerated.  . medroxyPROGESTERone (PROVERA) 5 MG tablet Take 1-2 tablets (5-10 mg total) by mouth daily.   No facility-administered encounter medications on file as of 01/04/2015.          Objective:   Physical Exam  Constitutional: She is oriented to person, place, and time. She appears well-developed and well-nourished.  HENT:  Head: Normocephalic and atraumatic.  Cardiovascular: Normal rate, regular rhythm and normal heart sounds.   Pulmonary/Chest: Effort normal and breath sounds normal.  Neurological: She is alert and oriented to person, place, and time.  Skin: Skin is warm and dry.  Psychiatric: She has a normal mood and affect. Her behavior is normal.          Assessment & Plan:  Abnormal weight gain/BMI 35-she's doing fantastic on the Saxenda. We'll increase her dose to 1.8 mg.. Follow-up in 2 months. Continue to work on diet and exercise.  Menorrhagia-will treat short-term with Provera. Her that this is temporary and typically the bleeding will recur as soon as she finishes the medication. She can also call her GYN disease they have any recommendations that she likely will have the surgery until April.  Elevated BP - will monitor.  She does have a family history of high blood pressure but is working very hard to lose weight and get back on track.  Flu vaccine given today.

## 2015-02-23 ENCOUNTER — Telehealth: Payer: Self-pay

## 2015-02-23 MED ORDER — LIRAGLUTIDE -WEIGHT MANAGEMENT 18 MG/3ML ~~LOC~~ SOPN: 1.8000 mg | PEN_INJECTOR | Freq: Every day | SUBCUTANEOUS | Status: DC

## 2015-02-24 NOTE — Telephone Encounter (Signed)
Yesterday pharmacy called with clarity of the dosage of Sexenda for patient.  Spoke with Iran Planas PA-C, and she clarified dosage of 1.8 mg for 1 week then progress to 2.4 in 1 week.  Notified pharmacy of clarification.

## 2015-03-08 ENCOUNTER — Encounter: Payer: Self-pay | Admitting: Family Medicine

## 2015-03-08 ENCOUNTER — Ambulatory Visit (INDEPENDENT_AMBULATORY_CARE_PROVIDER_SITE_OTHER): Payer: 59 | Admitting: Family Medicine

## 2015-03-08 ENCOUNTER — Telehealth: Payer: Self-pay | Admitting: Family Medicine

## 2015-03-08 VITALS — BP 128/84 | HR 72 | Ht 64.0 in | Wt 199.3 lb

## 2015-03-08 DIAGNOSIS — H538 Other visual disturbances: Secondary | ICD-10-CM

## 2015-03-08 DIAGNOSIS — R635 Abnormal weight gain: Secondary | ICD-10-CM

## 2015-03-08 DIAGNOSIS — E669 Obesity, unspecified: Secondary | ICD-10-CM | POA: Diagnosis not present

## 2015-03-08 DIAGNOSIS — N920 Excessive and frequent menstruation with regular cycle: Secondary | ICD-10-CM

## 2015-03-08 DIAGNOSIS — Z6834 Body mass index (BMI) 34.0-34.9, adult: Secondary | ICD-10-CM | POA: Diagnosis not present

## 2015-03-08 DIAGNOSIS — Z3009 Encounter for other general counseling and advice on contraception: Secondary | ICD-10-CM

## 2015-03-08 MED ORDER — MEDROXYPROGESTERONE ACETATE 5 MG PO TABS: 5.0000 mg | ORAL_TABLET | Freq: Every day | ORAL | Status: DC

## 2015-03-08 MED ORDER — LIRAGLUTIDE -WEIGHT MANAGEMENT 18 MG/3ML ~~LOC~~ SOPN: 2.4000 mg | PEN_INJECTOR | Freq: Every day | SUBCUTANEOUS | Status: DC

## 2015-03-08 MED ORDER — LEVONORGEST-ETH ESTRAD 91-DAY 0.15-0.03 MG PO TABS: 1.0000 | ORAL_TABLET | Freq: Every day | ORAL | Status: DC

## 2015-03-08 NOTE — Telephone Encounter (Signed)
Attempted to contact Pt, left VM to return clinic call regarding recommendation. Callback information provided.

## 2015-03-08 NOTE — Progress Notes (Signed)
   Subjective:    Patient ID: Jamie Mccarthy, female    DOB: [birth year], 41 y.o.   MRN: [id number]  HPI Follow-up and normal weight gain/obesity/BMI 34-she's been on Sexenda for the last 2 months and has been doing very well with it. She is actually down almost 7 pounds. He recently went up to 2.4 mg on the Sexenda and has had some nausea but is otherwise tolerating it well.  Contraceptive counseling-she's having difficulty keeping the NuvaRing in. She says it just seems to come out intermittently. She feels like her body has been rejecting it.  She still having some heavy vaginal bleeding and would like a refill on Provera to slow the bleeding down.  She also reports some vision change particularly in her left eye. She says noticed it's been a little bit more blurry over the last several weeks. She denies any trauma or injury itching or recent eye symptoms.  Review of Systems     Objective:   Physical Exam  Constitutional: She is oriented to person, place, and time. She appears well-developed and well-nourished.  HENT:  Head: Normocephalic and atraumatic.  Cardiovascular: Normal rate, regular rhythm and normal heart sounds.   Pulmonary/Chest: Effort normal and breath sounds normal.  Neurological: She is alert and oriented to person, place, and time.  Skin: Skin is warm and dry.  Psychiatric: She has a normal mood and affect. Her behavior is normal.       Assessment & Plan:  Abnormal weight gain/obesity/BMI 34-she's doing well with the Sexenda now up to 2.4 mg. New prescription sent to pharmacy. Follow-up in 6-8 weeks.  Blurry vision in left eye-we'll do vision eye chart today. She does have a prescription for some mild vision change on that left eye. Consider getting back in with her eye doctor. I can also double check and make sure that it's not a potential side effect of the Sexenda. Her last eye exam was about a year ago. Her vision was 20/50 in her left eye. Recommend referral  back to her eye doctor for further evaluation.  Heavy vaginal bleeding-we'll discontinue NuvaRing said she's not doing well with it and switch her to Seasonale. She also is planning on having a hysterectomy in the next couple years. She is seen at [facility]. Her last Pap smear was about a year ago.  Contraceptive counseling-discontinue NuvaRing and switch to Seasonale. Her Pap smear is up-to-date. She goes to [facility] and sees Paula Compton. Will call to get last Pap smear report.

## 2015-03-08 NOTE — Telephone Encounter (Signed)
Please call patient: She had a significant difference in vision her left eye compared to her right. I strongly encouraged her to get back in with her eye doctor for full dilated eye exam in the next couple of weeks if possible.

## 2015-03-20 ENCOUNTER — Other Ambulatory Visit: Payer: Self-pay | Admitting: Sports Medicine

## 2015-03-22 ENCOUNTER — Encounter: Payer: Self-pay | Admitting: Family Medicine

## 2015-04-27 ENCOUNTER — Encounter: Payer: Self-pay | Admitting: Family Medicine

## 2015-04-27 ENCOUNTER — Ambulatory Visit (INDEPENDENT_AMBULATORY_CARE_PROVIDER_SITE_OTHER): Payer: 59 | Admitting: Family Medicine

## 2015-04-27 VITALS — BP 144/89 | HR 89 | Temp 98.3°F | Wt 190.0 lb

## 2015-04-27 DIAGNOSIS — R635 Abnormal weight gain: Secondary | ICD-10-CM

## 2015-04-27 DIAGNOSIS — J209 Acute bronchitis, unspecified: Secondary | ICD-10-CM

## 2015-04-27 DIAGNOSIS — J029 Acute pharyngitis, unspecified: Secondary | ICD-10-CM

## 2015-04-27 DIAGNOSIS — Z6832 Body mass index (BMI) 32.0-32.9, adult: Secondary | ICD-10-CM

## 2015-04-27 LAB — POCT RAPID STREP A (OFFICE): Rapid Strep A Screen: NEGATIVE

## 2015-04-27 MED ORDER — HYDROCODONE-HOMATROPINE 5-1.5 MG/5ML PO SYRP: 5.0000 mL | ORAL_SOLUTION | Freq: Every evening | ORAL | Status: DC | PRN

## 2015-04-27 MED ORDER — LIRAGLUTIDE -WEIGHT MANAGEMENT 18 MG/3ML ~~LOC~~ SOPN: 1.8000 mg | PEN_INJECTOR | Freq: Every day | SUBCUTANEOUS | Status: DC

## 2015-04-27 NOTE — Progress Notes (Signed)
   Subjective:    Patient ID: Jamie Mccarthy, female    DOB: 10/31/1975, 40 y.o.   MRN: [id number]  HPI Sore throat and productive cough 4 days. Using over-the-counter Tylenol Cold. No fever. No ear pain. Some sinus drainage but not a lot of nasal congestion. No GI symptoms. No SOB.   Abnormal weight gain/BMI 32 today. She is actually lost 9 pounds since I saw her about 6 weeks ago. She was unable to tolerate 2.4 mg Sexenda dose back down to 1.8 after a couple of days. She is doing well o/w.  She feels like she is doing well with her diet.   Review of Systems     Objective:   Physical Exam  Constitutional: She is oriented to person, place, and time. She appears well-developed and well-nourished.  HENT:  Head: Normocephalic and atraumatic.  Right Ear: External ear normal.  Left Ear: External ear normal.  Nose: Nose normal.  Mouth/Throat: Oropharynx is clear and moist.  TMs and canals are clear.   Eyes: Conjunctivae and EOM are normal. Pupils are equal, round, and reactive to light.  Neck: Neck supple. No thyromegaly present.  Cardiovascular: Normal rate, regular rhythm and normal heart sounds.   Pulmonary/Chest: Effort normal and breath sounds normal. She has no wheezes.  Lymphadenopathy:    She has no cervical adenopathy.  Neurological: She is alert and oriented to person, place, and time.  Skin: Skin is warm and dry.  Psychiatric: She has a normal mood and affect.          Assessment & Plan:  Consistent with early bronchitis-most likely viral. Will give her hydrocodone cough syrup at bedtime since the cough is keeping her awake at night. One about potential for sedation. Call if not better in one week or call sooner if suddenly worse or develops a fever or become short of breath.  Abnormal weight gain/BMI 32 - she has lost 9 lbs. She is doing fantastic. We'll continue Sexenda at 1.8 mg. She should have enough without sending a new prescription today. Follow-up in 6 weeks for  blood pressure and weight check.

## 2015-04-27 NOTE — Patient Instructions (Signed)

## 2015-05-09 ENCOUNTER — Ambulatory Visit: Payer: 59 | Admitting: Family Medicine

## 2015-06-07 ENCOUNTER — Other Ambulatory Visit: Payer: Self-pay | Admitting: Family Medicine

## 2015-06-15 ENCOUNTER — Ambulatory Visit (INDEPENDENT_AMBULATORY_CARE_PROVIDER_SITE_OTHER): Payer: 59 | Admitting: Family Medicine

## 2015-06-15 ENCOUNTER — Encounter: Payer: Self-pay | Admitting: Family Medicine

## 2015-06-15 VITALS — BP 137/92 | HR 77 | Wt 191.0 lb

## 2015-06-15 DIAGNOSIS — J302 Other seasonal allergic rhinitis: Secondary | ICD-10-CM | POA: Diagnosis not present

## 2015-06-15 DIAGNOSIS — N920 Excessive and frequent menstruation with regular cycle: Secondary | ICD-10-CM | POA: Diagnosis not present

## 2015-06-15 DIAGNOSIS — Z13220 Encounter for screening for lipoid disorders: Secondary | ICD-10-CM

## 2015-06-15 DIAGNOSIS — R635 Abnormal weight gain: Secondary | ICD-10-CM | POA: Diagnosis not present

## 2015-06-15 LAB — COMPLETE METABOLIC PANEL WITH GFR
ALT: 7 U/L (ref 6–29)
AST: 12 U/L (ref 10–30)
Albumin: 3.8 g/dL (ref 3.6–5.1)
Alkaline Phosphatase: 50 U/L (ref 33–115)
BILIRUBIN TOTAL: 0.7 mg/dL (ref 0.2–1.2)
BUN: 5 mg/dL — ABNORMAL LOW (ref 7–25)
CO2: 25 mmol/L (ref 20–31)
Calcium: 8.8 mg/dL (ref 8.6–10.2)
Chloride: 107 mmol/L (ref 98–110)
Creat: 0.63 mg/dL (ref 0.50–1.10)
Glucose, Bld: 83 mg/dL (ref 65–99)
Potassium: 3.8 mmol/L (ref 3.5–5.3)
Sodium: 140 mmol/L (ref 135–146)
Total Protein: 6.5 g/dL (ref 6.1–8.1)

## 2015-06-15 LAB — LIPID PANEL
Cholesterol: 133 mg/dL (ref 125–200)
HDL: 44 mg/dL — AB (ref >=46)
LDL CALC: 79 mg/dL (ref <130)
TRIGLYCERIDES: 51 mg/dL (ref <150)
Total CHOL/HDL Ratio: 3 Ratio (ref <=5.0)
VLDL: 10 mg/dL (ref <30)

## 2015-06-15 MED ORDER — LEVOCETIRIZINE DIHYDROCHLORIDE 5 MG PO TABS: 5.0000 mg | ORAL_TABLET | Freq: Every evening | ORAL | Status: DC

## 2015-06-15 MED ORDER — LIRAGLUTIDE -WEIGHT MANAGEMENT 18 MG/3ML ~~LOC~~ SOPN: 2.4000 mg | PEN_INJECTOR | Freq: Every day | SUBCUTANEOUS | Status: DC

## 2015-06-15 NOTE — Progress Notes (Signed)
Subjective:    CC: weight check   HPI:   Abnormal weight gain  - She feels like she is doing really fantastic with the Sexenda. When I last saw her she was unable to tolerate 2.4 mg. She decided to start alternating between 1.8 and 2.4 and says she's been doing that it has been working well. She actually feels like it's been working better than her gastric banding that she had done. She is actually lost about 40 pounds over the last 6 months and is doing fantastic. She does get a little nauseated at times. She admits she has not been able to exercise regularly and plans on getting back into the gym. She also says since I last saw her she had some stressful things happen and she admits that she was stress eating some.  AR- says the OTC meds are not helping. Using a nasal steroid spray as well. She mostly has nasal congestion.  Menorrhagia-she is happy with her current birth control and feels like it is working well. But she is planning on a hysterectomy in September.  Past medical history, Surgical history, Family history not pertinant except as noted below, Social history, Allergies, and medications have been entered into the medical record, reviewed, and corrections made.   Review of Systems: No fevers, chills, night sweats, weight loss, chest pain, or shortness of breath.   Objective:    General: Well Developed, well nourished, and in no acute distress.  Neuro: Alert and oriented x3, extra-ocular muscles intact, sensation grossly intact.  HEENT: Normocephalic, atraumatic  Skin: Warm and dry, no rashes. Cardiac: Regular rate and rhythm, no murmurs rubs or gallops, no lower extremity edema.  Respiratory: Clear to auscultation bilaterally. Not using accessory muscles, speaking in full sentences.   Impression and Recommendations:    Abnormal weight gain - For now to grow continue with the Sexenda I think overall she is doing great with it. Her weight was stable from last time but so far she  is still lost a total of 40 pounds in the last 6 months. Encouraged her to get back on track with exercise which I think is what's mostly lacking at this point and really work on controlling her stress eating. Next  AR- will call in xyzal 5mg  QD.  Continue with nasal saline as well.  Menorrhagia-improved on her current birth control. Planning for hysterectomy in September.

## 2015-06-22 ENCOUNTER — Ambulatory Visit: Payer: 59

## 2015-06-22 ENCOUNTER — Telehealth: Payer: Self-pay | Admitting: *Deleted

## 2015-06-22 NOTE — Telephone Encounter (Signed)
appt scheduled for ? Yeast inf.Audelia Hives Arthur

## 2015-07-05 ENCOUNTER — Ambulatory Visit (INDEPENDENT_AMBULATORY_CARE_PROVIDER_SITE_OTHER): Payer: 59 | Admitting: Osteopathic Medicine

## 2015-07-05 VITALS — BP 139/88 | HR 79 | Wt 193.0 lb

## 2015-07-05 DIAGNOSIS — N898 Other specified noninflammatory disorders of vagina: Secondary | ICD-10-CM

## 2015-07-05 LAB — WET PREP FOR TRICH, YEAST, CLUE
TRICH WET PREP: NONE SEEN
YEAST WET PREP: NONE SEEN

## 2015-07-05 MED ORDER — FLUCONAZOLE 150 MG PO TABS: 150.0000 mg | ORAL_TABLET | Freq: Once | ORAL | Status: DC

## 2015-07-05 MED ORDER — METRONIDAZOLE 500 MG PO TABS: 500.0000 mg | ORAL_TABLET | Freq: Two times a day (BID) | ORAL | Status: DC

## 2015-07-05 NOTE — Patient Instructions (Signed)
Monilial Vaginitis Vaginitis in a soreness, swelling and redness (inflammation) of the vagina and vulva. Monilial vaginitis is not a sexually transmitted infection. CAUSES  Yeast vaginitis is caused by yeast (candida) that is normally found in your vagina. With a yeast infection, the candida has overgrown in number to a point that upsets the chemical balance. SYMPTOMS   White, thick vaginal discharge.  Swelling, itching, redness and irritation of the vagina and possibly the lips of the vagina (vulva).  Burning or painful urination.  Painful intercourse. DIAGNOSIS  Things that may contribute to monilial vaginitis are:  Postmenopausal and virginal states.  Pregnancy.  Infections.  Being tired, sick or stressed, especially if you had monilial vaginitis in the past.  Diabetes. Good control will help lower the chance.  Birth control pills.  Tight fitting garments.  Using bubble bath, feminine sprays, douches or deodorant tampons.  Taking certain medications that kill germs (antibiotics).  Sporadic recurrence can occur if you become ill. TREATMENT  Your caregiver will give you medication.  There are several kinds of anti monilial vaginal creams and suppositories specific for monilial vaginitis. For recurrent yeast infections, use a suppository or cream in the vagina 2 times a week, or as directed.  Anti-monilial or steroid cream for the itching or irritation of the vulva may also be used. Get your caregiver's permission.  Painting the vagina with methylene blue solution may help if the monilial cream does not work.  Eating yogurt may help prevent monilial vaginitis. HOME CARE INSTRUCTIONS   Finish all medication as prescribed.  Do not have sex until treatment is completed or after your caregiver tells you it is okay.  Take warm sitz baths.  Do not douche.  Do not use tampons, especially scented ones.  Wear cotton underwear.  Avoid tight pants and panty  hose.  Tell your sexual partner that you have a yeast infection. They should go to their caregiver if they have symptoms such as mild rash or itching.  Your sexual partner should be treated as well if your infection is difficult to eliminate.  Practice safer sex. Use condoms.  Some vaginal medications cause latex condoms to fail. Vaginal medications that harm condoms are:  Cleocin cream.  Butoconazole (Femstat).  Terconazole (Terazol) vaginal suppository.  Miconazole (Monistat) (may be purchased over the counter). SEEK MEDICAL CARE IF:   You have a temperature by mouth above 102 F (38.9 C).  The infection is getting worse after 2 days of treatment.  The infection is not getting better after 3 days of treatment.  You develop blisters in or around your vagina.  You develop vaginal bleeding, and it is not your menstrual period.  You have pain when you urinate.  You develop intestinal problems.  You have pain with sexual intercourse.   This information is not intended to replace advice given to you by your health care provider. Make sure you discuss any questions you have with your health care provider.   Document Released: 10/18/2004 Document Revised: 04/02/2011 Document Reviewed: 07/12/2014 Elsevier Interactive Patient Education 2016 Elsevier Inc.    Bacterial Vaginosis Bacterial vaginosis is a vaginal infection that occurs when the normal balance of bacteria in the vagina is disrupted. It results from an overgrowth of certain bacteria. This is the most common vaginal infection in women of childbearing age. Treatment is important to prevent complications, especially in pregnant women, as it can cause a premature delivery. CAUSES  Bacterial vaginosis is caused by an increase in harmful bacteria that  are normally present in smaller amounts in the vagina. Several different kinds of bacteria can cause bacterial vaginosis. However, the reason that the condition develops is  not fully understood. RISK FACTORS Certain activities or behaviors can put you at an increased risk of developing bacterial vaginosis, including:  Having a new sex partner or multiple sex partners.  Douching.  Using an intrauterine device (IUD) for contraception. Women do not get bacterial vaginosis from toilet seats, bedding, swimming pools, or contact with objects around them. SIGNS AND SYMPTOMS  Some women with bacterial vaginosis have no signs or symptoms. Common symptoms include:  Grey vaginal discharge.  A fishlike odor with discharge, especially after sexual intercourse.  Itching or burning of the vagina and vulva.  Burning or pain with urination. DIAGNOSIS  Your health care provider will take a medical history and examine the vagina for signs of bacterial vaginosis. A sample of vaginal fluid may be taken. Your health care provider will look at this sample under a microscope to check for bacteria and abnormal cells. A vaginal pH test may also be done.  TREATMENT  Bacterial vaginosis may be treated with antibiotic medicines. These may be given in the form of a pill or a vaginal cream. A second round of antibiotics may be prescribed if the condition comes back after treatment. Because bacterial vaginosis increases your risk for sexually transmitted diseases, getting treated can help reduce your risk for chlamydia, gonorrhea, HIV, and herpes. HOME CARE INSTRUCTIONS   Only take over-the-counter or prescription medicines as directed by your health care provider.  If antibiotic medicine was prescribed, take it as directed. Make sure you finish it even if you start to feel better.  Tell all sexual partners that you have a vaginal infection. They should see their health care provider and be treated if they have problems, such as a mild rash or itching.  During treatment, it is important that you follow these instructions:  Avoid sexual activity or use condoms correctly.  Do not  douche.  Avoid alcohol as directed by your health care provider.  Avoid breastfeeding as directed by your health care provider. SEEK MEDICAL CARE IF:   Your symptoms are not improving after 3 days of treatment.  You have increased discharge or pain.  You have a fever. MAKE SURE YOU:   Understand these instructions.  Will watch your condition.  Will get help right away if you are not doing well or get worse. FOR MORE INFORMATION  Centers for Disease Control and Prevention, Division of STD Prevention: [employer] American Sexual Health Association (ASHA): www.ashastd.org    This information is not intended to replace advice given to you by your health care provider. Make sure you discuss any questions you have with your health care provider.   Document Released: 01/08/2005 Document Revised: 01/29/2014 Document Reviewed: 08/20/2012 Elsevier Interactive Patient Education [employer].

## 2015-07-05 NOTE — Progress Notes (Signed)
HPI: Jamie Mccarthy is a 40 y.o. female who presents to [facility] today for chief complaint of:  Chief Complaint  Patient presents with  . Vaginitis     . Context: recently treated for yeast infection 2 weeks ago . Location/ Quality: whitish discharge and vaginal itching . Modifying factors: tried Terconazole and Fluconazole which helped . Assoc signs/symptoms: no pelvic pain or fever   Past medical, social and family history reviewed: Past Medical History  Diagnosis Date  . PONV (postoperative nausea and vomiting)     has had PONV with previous c/s and also lap band surgery 2004  . GERD (gastroesophageal reflux disease)     diet controlled  . Anemia     Has sickle cell trait  . Iron deficiency anemia, unspecified 10/20/2012  . Hypertension   . Gestational diabetes   . Headache(784.0)   . Herpes 2007    hx of  . Seasonal allergies   . H/O vaginal delivery 2010   Past Surgical History  Procedure Laterality Date  . Laparoscopic gastric banding  2005  . Cesarean section  2012  . Cesarean section N/A 07/16/2012    Procedure: REPEAT CESAREAN SECTION;  Surgeon: Logan Bores, MD;  Location: [facility];  Service: Obstetrics;  Laterality: N/A;  . Bilateral salpingectomy  07/16/2012    Procedure: BILATERAL DISTAL SALPINGECTOMY;  Surgeon: Logan Bores, MD;  Location: [facility];  Service: Obstetrics;;  . Reposition gastric band  2006    x2  . Wisdom tooth extraction  1999  . Tubal ligation  2014  . Gastric banding port revision N/A 11/14/2012    Procedure: GASTRIC BANDING PORT REVISION;  Surgeon: Gayland Curry, MD;  Location: [facility];  Service: General;  Laterality: N/A;  PORT PLACEMENT MOVED NEW MESH INSERTED   . Cholecystectomy N/A 11/14/2012    Procedure: LAPAROSCOPIC CHOLECYSTECTOMY;  Surgeon: Gayland Curry, MD;  Location: [facility];  Service: General;  Laterality: N/A;   Social History  Substance Use Topics  . Smoking status: Never  Smoker   . Smokeless tobacco: Never Used  . Alcohol Use: No   Family History  Problem Relation Age of Onset  . Diabetes      father's family  . Breast cancer Maternal Grandmother   . Hypertension Mother   . Thyroid cancer      Current Outpatient Prescriptions  Medication Sig Dispense Refill  . Insulin Pen Needle 31G X 8 MM MISC Inject into skin daily. 100 each prn  . levocetirizine (XYZAL) 5 MG tablet Take 1 tablet (5 mg total) by mouth every evening. 90 tablet 1  . levonorgestrel-ethinyl estradiol (SEASONALE,INTROVALE,JOLESSA) 0.15-0.03 MG tablet Take 1 tablet by mouth daily. 1 Package 4  . Liraglutide -Weight Management (SAXENDA) 18 MG/3ML SOPN Inject 2.4 mg into the skin daily. 5 pen 1  . medroxyPROGESTERone (PROVERA) 5 MG tablet Take 1-2 tablets (5-10 mg total) by mouth daily. 20 tablet 1   No current facility-administered medications for this visit.   Allergies  Allergen Reactions  . Latex Itching    Pt states she gets severely itchy with latex  . Naproxen Rash      Review of Systems: CONSTITUTIONAL:  No  fever, no chills, No recent illness,  GENITOURINARY: No  incontinence, No  abnormal genital bleeding/discharge other than noted in HPI SKIN: No  rash/wounds/concerning lesions  Exam:  BP 139/88 mmHg  Pulse 79  Wt 193 lb (87.544 kg) Constitutional: VS see above.  General Appearance: alert, well-developed, well-nourished, NAD Psychiatric: Normal judgment/insight. Normal mood and affect. Oriented x3.    ASSESSMENT/PLAN: We'll treat for yeast as well as BV, wet prep to confirm. Consider vaginal cultures/STD testing if symptoms fail to resolve. Patient information printed  Vaginal irritation - Plan: fluconazole (DIFLUCAN) 150 MG tablet, metroNIDAZOLE (FLAGYL) 500 MG tablet, WET PREP FOR TRICH, YEAST, CLUE   All questions were answered. Visit summary with medication list and pertinent instructions was printed for patient to review. ER/RTC precautions were reviewed with  the patient. Return if symptoms worsen or fail to improve.

## 2015-07-11 ENCOUNTER — Telehealth: Payer: Self-pay

## 2015-07-11 MED ORDER — METRONIDAZOLE 0.75 % EX GEL: 1.0000 "application " | Freq: Two times a day (BID) | CUTANEOUS | Status: DC

## 2015-07-11 NOTE — Telephone Encounter (Signed)
Jamie Mccarthy called and left a message stating she cannot take the Flagyl. I tried to call her back to find out what was the side effects. She did not answer. She stated on the message she would like the gel. Please advise.

## 2015-08-02 ENCOUNTER — Ambulatory Visit: Payer: 59 | Admitting: Family Medicine

## 2015-09-12 ENCOUNTER — Ambulatory Visit (INDEPENDENT_AMBULATORY_CARE_PROVIDER_SITE_OTHER): Payer: 59 | Admitting: Family Medicine

## 2015-09-12 ENCOUNTER — Encounter: Payer: Self-pay | Admitting: Family Medicine

## 2015-09-12 VITALS — BP 130/90 | HR 74 | Wt 180.0 lb

## 2015-09-12 DIAGNOSIS — R42 Dizziness and giddiness: Secondary | ICD-10-CM | POA: Diagnosis not present

## 2015-09-12 DIAGNOSIS — IMO0001 Reserved for inherently not codable concepts without codable children: Secondary | ICD-10-CM

## 2015-09-12 DIAGNOSIS — R030 Elevated blood-pressure reading, without diagnosis of hypertension: Secondary | ICD-10-CM | POA: Diagnosis not present

## 2015-09-12 NOTE — Progress Notes (Signed)
Subjective:    CC: HTN  HPI: Elevated BP  Pt denies chest pain, SOB, dizziness, or heart palpitations.  Taking meds as directed w/o problems.  Denies medication side effects.  Says pressure was high at her OB office. She reports it was 162/113. She's nervous because she says it's never been that high before. She is planning on hysterectomy in September. He does have a family history of hypertension area  Episodes of dizziness.  worse when standing up and when bending over.  Says it started over the weekend.  She Has had a HA with it and thought initially it was a migraine. She on bith control until her hysterectomy in Sept.  of the weekend she did get a burning sensation in her chest and felt a Moe short of breath. She says she still noticing that she feels like she has to work a little harder to take a deep breath but no chest pain today. She has not been taking any NSAIDs or decongestants.  Past medical history, Surgical history, Family history not pertinant except as noted below, Social history, Allergies, and medications have been entered into the medical record, reviewed, and corrections made.   Review of Systems: No fevers, chills, night sweats, weight loss, chest pain, or shortness of breath.   Objective:    General: Well Developed, well nourished, and in no acute distress.  Neuro: Alert and oriented x3, extra-ocular muscles intact, sensation grossly intact.  HEENT: Normocephalic, atraumatic , Oropharynx clear, TMs and canals are clear bilaterally.No cervical LN. No TM.   Skin: Warm and dry, no rashes. Cardiac: Regular rate and rhythm, no murmurs rubs or gallops, no lower extremity edema.  Respiratory: Clear to auscultation bilaterally. Not using accessory muscles, speaking in full sentences. Neuro: Cranial nerves II through XII intact. X Hallpike maneuver to the right did re-create her symptoms that she did not have any nystagmus on exam.   Impression and Recommendations:   Elevated  BP - check thyroid.  Avoid salt. Recheck in 1-2 weeks.    Dizziness - Unclear etiology.  Vertigo vs BPV.  I am concerned that she also had some chest pain and shortness of breath. Though it seems like it's a little better than it was previously but she is also on birth control so we'll check a d-dimer.

## 2015-09-30 ENCOUNTER — Encounter: Payer: Self-pay | Admitting: Family Medicine

## 2015-09-30 ENCOUNTER — Ambulatory Visit (INDEPENDENT_AMBULATORY_CARE_PROVIDER_SITE_OTHER): Payer: 59 | Admitting: Family Medicine

## 2015-09-30 VITALS — BP 135/82 | HR 95 | Wt 175.0 lb

## 2015-09-30 DIAGNOSIS — J069 Acute upper respiratory infection, unspecified: Secondary | ICD-10-CM | POA: Diagnosis not present

## 2015-09-30 MED ORDER — FLUCONAZOLE 150 MG PO TABS: 150.0000 mg | ORAL_TABLET | Freq: Once | ORAL | 1 refills | Status: AC

## 2015-09-30 NOTE — Patient Instructions (Addendum)
Call if not better next week.     Viral Infections A viral infection can be caused by different types of viruses.Most viral infections are not serious and resolve on their own. However, some infections may cause severe symptoms and may lead to further complications. SYMPTOMS Viruses can frequently cause:  Minor sore throat.  Aches and pains.  Headaches.  Runny nose.  Different types of rashes.  Watery eyes.  Tiredness.  Cough.  Loss of appetite.  Gastrointestinal infections, resulting in nausea, vomiting, and diarrhea. These symptoms do not respond to antibiotics because the infection is not caused by bacteria. However, you might catch a bacterial infection following the viral infection. This is sometimes called a "superinfection." Symptoms of such a bacterial infection may include:  Worsening sore throat with pus and difficulty swallowing.  Swollen neck glands.  Chills and a high or persistent fever.  Severe headache.  Tenderness over the sinuses.  Persistent overall ill feeling (malaise), muscle aches, and tiredness (fatigue).  Persistent cough.  Yellow, green, or brown mucus production with coughing. HOME CARE INSTRUCTIONS   Only take over-the-counter or prescription medicines for pain, discomfort, diarrhea, or fever as directed by your caregiver.  Drink enough water and fluids to keep your urine clear or pale yellow. Sports drinks can provide valuable electrolytes, sugars, and hydration.  Get plenty of rest and maintain proper nutrition. Soups and broths with crackers or rice are fine. SEEK IMMEDIATE MEDICAL CARE IF:   You have severe headaches, shortness of breath, chest pain, neck pain, or an unusual rash.  You have uncontrolled vomiting, diarrhea, or you are unable to keep down fluids.  You or your child has an oral temperature above 102 F (38.9 C), not controlled by medicine.  Your baby is older than 3 months with a rectal temperature of 102 F  (38.9 C) or higher.  Your baby is 59 months old or younger with a rectal temperature of 100.4 F (38 C) or higher. MAKE SURE YOU:   Understand these instructions.  Will watch your condition.  Will get help right away if you are not doing well or get worse.   This information is not intended to replace advice given to you by your health care provider. Make sure you discuss any questions you have with your health care provider.   Document Released: 10/18/2004 Document Revised: 04/02/2011 Document Reviewed: 06/16/2014 Elsevier Interactive Patient Education [employer].

## 2015-09-30 NOTE — Progress Notes (Signed)
   Subjective:    Patient ID: Jamie Mccarthy, female    DOB: 11/05/1975, 40 y.o.   MRN: [id number]  HPI Has congestion and pressure 2 days. She has had a cough but denies any fevers or chills or sweats. She's had a mild sore throat that actually feels a little bit better today and some right ear pain. A slight cough but she feels it's mostly from postnasal drip. No worsening or alleviating factors. She does feel a little nauseated. He is taking her Xyzal 4 allergic rhinitis.  Review of Systems     Objective:   Physical Exam  Constitutional: She is oriented to person, place, and time. She appears well-developed and well-nourished.  HENT:  Head: Normocephalic and atraumatic.  Right Ear: External ear normal.  Left Ear: External ear normal.  Nose: Nose normal.  Mouth/Throat: Oropharynx is clear and moist.  TMs and canals are clear.   Eyes: Conjunctivae and EOM are normal. Pupils are equal, round, and reactive to light.  Neck: Neck supple. No thyromegaly present.  Small right anteriro cerv lN that is tender.   Cardiovascular: Normal rate, regular rhythm and normal heart sounds.   Pulmonary/Chest: Effort normal and breath sounds normal. She has no wheezes.  Lymphadenopathy:    She has cervical adenopathy.  Neurological: She is alert and oriented to person, place, and time.  Skin: Skin is warm and dry.  Psychiatric: She has a normal mood and affect.          Assessment & Plan:  Upper respiratory infection-likely viral. Gave reassurance. Recommended over-the-counter zinc lozenges to reduce the duration of the illness. Stay hydrated. Also recommended nasal saline rinses. Call next week if not improving or if suddenly feeling worse.

## 2015-10-03 NOTE — Patient Instructions (Signed)
Your procedure is scheduled on:  Wednesday, Sept. 20, 2017  Enter through the [employer] [facility] at:  7:00 AM  Pick up the phone at the desk and dial [phone].  Call this number if you have problems the morning of surgery: [phone].  Remember: Do NOT eat food or drink after:  Midnight Tuesday, Sept. 19, 2017  Take these medicines the morning of surgery with a SIP OF WATER:  None  Do NOT wear jewelry (body piercing), metal hair clips/bobby pins, make-up, or nail polish. Do NOT wear lotions, powders, or perfumes.  You may wear deodorant. Do NOT shave for 48 hours prior to surgery. Do NOT bring valuables to the hospital. Contacts, dentures, or bridgework may not be worn into surgery.  Leave suitcase in car.  After surgery it may be brought to your room.  For patients admitted to the hospital, checkout time is 11:00 AM the day of discharge.

## 2015-10-04 ENCOUNTER — Encounter (HOSPITAL_COMMUNITY): Payer: Self-pay

## 2015-10-04 ENCOUNTER — Encounter (HOSPITAL_COMMUNITY)
Admission: RE | Admit: 2015-10-04 | Discharge: 2015-10-04 | Disposition: A | Discharge status: Home / Self Care | Payer: 59 | Source: Ambulatory Visit | Attending: Obstetrics and Gynecology | Admitting: Obstetrics and Gynecology

## 2015-10-04 DIAGNOSIS — Z01812 Encounter for preprocedural laboratory examination: Secondary | ICD-10-CM | POA: Diagnosis not present

## 2015-10-04 LAB — COMPREHENSIVE METABOLIC PANEL
ALBUMIN: 3.9 g/dL (ref 3.5–5.0)
ALK PHOS: 61 U/L (ref 38–126)
ALT: 13 U/L — AB (ref 14–54)
AST: 18 U/L (ref 15–41)
Anion gap: 6 (ref 5–15)
CALCIUM: 9.3 mg/dL (ref 8.9–10.3)
CO2: 27 mmol/L (ref 22–32)
CREATININE: 0.57 mg/dL (ref 0.44–1.00)
Chloride: 107 mmol/L (ref 101–111)
GFR calc Af Amer: >60 mL/min (ref >60)
GFR calc non Af Amer: >60 mL/min (ref >60)
GLUCOSE: 82 mg/dL (ref 65–99)
Potassium: 3.8 mmol/L (ref 3.5–5.1)
SODIUM: 140 mmol/L (ref 135–145)
Total Bilirubin: 0.7 mg/dL (ref 0.3–1.2)
Total Protein: 6.9 g/dL (ref 6.5–8.1)

## 2015-10-04 LAB — TYPE AND SCREEN
ABO/RH(D): O POS
Antibody Screen: NEGATIVE

## 2015-10-04 LAB — CBC
HCT: 34.4 % — ABNORMAL LOW (ref 36.0–46.0)
HEMOGLOBIN: 11.4 g/dL — AB (ref 12.0–15.0)
MCH: 23.8 pg — AB (ref 26.0–34.0)
MCHC: 33.1 g/dL (ref 30.0–36.0)
MCV: 71.7 fL — ABNORMAL LOW (ref 78.0–100.0)
Platelets: 299 10*3/uL (ref 150–400)
RBC: 4.8 MIL/uL (ref 3.87–5.11)
RDW: 15.1 % (ref 11.5–15.5)
WBC: 6.1 10*3/uL (ref 4.0–10.5)

## 2015-10-04 NOTE — H&P (Signed)
Jamie Mccarthy is an 40 y.o. female. [id number] with history of menorhagia  desiring definitive treatment. Pt has known history of fibroids that have remained essentially unchanged. Pt has a history of abdominal surgeries and btl. Risks, benefits and alternatives to hysterectomy have been reviewed with pt multiple times and all questions answered. Pt has received medical clearance from her PCP for surgery.   Pertinent Gynecological History: Menses: flow is excessive with use of >8 pads or tampons on heaviest days Bleeding: menorrhagia Contraception: tubal ligation DES exposure: denies Blood transfusions: none Sexually transmitted diseases: no past history Previous GYN Procedures: none however has had c/s x 2 and lap band surgery; has known fibroids   Last pap: normal Date: 10/13/2014 OB History: G3, P3104   Menstrual History: Menarche age: 31 Patient's last menstrual period was 08/29/2015 (approximate).    Past Medical History:  Diagnosis Date  . Anemia    Has sickle cell trait  . GERD (gastroesophageal reflux disease)    diet controlled  . Gestational diabetes   . H/O vaginal delivery 2010  . Headache(784.0)    Migraines  . Herpes 2007   hx of  . Hypertension    no current medications  . Iron deficiency anemia, unspecified 10/20/2012  . PONV (postoperative nausea and vomiting)    has had PONV with previous c/s and also lap band surgery 2004  . Seasonal allergies     Past Surgical History:  Procedure Laterality Date  . BILATERAL SALPINGECTOMY  07/16/2012   Procedure: BILATERAL DISTAL SALPINGECTOMY;  Surgeon: Logan Bores, MD;  Location: [facility];  Service: Obstetrics;;  . CESAREAN SECTION  2012  . CESAREAN SECTION N/A 07/16/2012   Procedure: REPEAT CESAREAN SECTION;  Surgeon: Logan Bores, MD;  Location: [facility];  Service: Obstetrics;  Laterality: N/A;  . CHOLECYSTECTOMY N/A 11/14/2012   Procedure: LAPAROSCOPIC CHOLECYSTECTOMY;  Surgeon: Gayland Curry, MD;  Location:  [facility];  Service: General;  Laterality: N/A;  . GASTRIC BANDING PORT REVISION N/A 11/14/2012   Procedure: GASTRIC BANDING PORT REVISION;  Surgeon: Gayland Curry, MD;  Location: [facility];  Service: General;  Laterality: N/A;  PORT PLACEMENT MOVED NEW MESH INSERTED   . LAPAROSCOPIC GASTRIC BANDING  2005  . reposition gastric band  2006   x2  . TUBAL LIGATION  2014  . WISDOM TOOTH EXTRACTION  1999    Family History  Problem Relation Age of Onset  . Hypertension Mother   . Diabetes      father's family  . Thyroid cancer    . Breast cancer Maternal Grandmother     Social History:  reports that she has never smoked. She has never used smokeless tobacco. She reports that she drinks alcohol. She reports that she does not use drugs.  Allergies:  Allergies  Allergen Reactions  . Latex Itching    Pt states she gets severely itchy with latex  . Naproxen Rash    No prescriptions prior to admission.    Review of Systems  Constitutional: Positive for malaise/fatigue and weight loss. Negative for chills and fever.  Eyes: Negative for blurred vision.  Respiratory: Negative for shortness of breath.   Cardiovascular: Negative for chest pain and leg swelling.  Gastrointestinal: Positive for abdominal pain. Negative for heartburn, nausea and vomiting.  Genitourinary: Negative for dysuria.  Musculoskeletal: Negative for myalgias.  Skin: Negative for itching and rash.  Neurological: Negative for dizziness, weakness and headaches.  Psychiatric/Behavioral: Negative for depression, hallucinations, substance abuse and suicidal  ideas. The patient is not nervous/anxious.     Last menstrual period 08/29/2015. Physical Exam  Constitutional: She is oriented to person, place, and time. She appears well-developed and well-nourished.  HENT:  Head: Normocephalic.  Neck: Normal range of motion.  Cardiovascular: Intact distal pulses.   Respiratory: Effort normal.  GI: Soft.  Genitourinary: Vagina  normal.  Genitourinary Comments: Fibroid uterus Hx several abdominal surgeries: lap band and c/s x 2  Musculoskeletal: Normal range of motion.  Neurological: She is alert and oriented to person, place, and time.  Skin: Skin is warm.  Psychiatric: She has a normal mood and affect. Her behavior is normal. Judgment and thought content normal.    Results for orders placed or performed during the hospital encounter of 10/04/15 (from the past 24 hour(s))  CBC     Status: Abnormal   Collection Time: 10/04/15 12:15 PM  Result Value Ref Range   WBC 6.1 4.0 - 10.5 K/uL   RBC 4.80 3.87 - 5.11 MIL/uL   Hemoglobin 11.4 (L) 12.0 - 15.0 g/dL   HCT 34.4 (L) 36.0 - 46.0 %   MCV 71.7 (L) 78.0 - 100.0 fL   MCH 23.8 (L) 26.0 - 34.0 pg   MCHC 33.1 30.0 - 36.0 g/dL   RDW 15.1 11.5 - 15.5 %   Platelets 299 150 - 400 K/uL  Comprehensive metabolic panel     Status: Abnormal   Collection Time: 10/04/15 12:15 PM  Result Value Ref Range   Sodium 140 135 - 145 mmol/L   Potassium 3.8 3.5 - 5.1 mmol/L   Chloride 107 101 - 111 mmol/L   CO2 27 22 - 32 mmol/L   Glucose, Bld 82 65 - 99 mg/dL   BUN <5 (L) 6 - 20 mg/dL   Creatinine, Ser 0.57 0.44 - 1.00 mg/dL   Calcium 9.3 8.9 - 10.3 mg/dL   Total Protein 6.9 6.5 - 8.1 g/dL   Albumin 3.9 3.5 - 5.0 g/dL   AST 18 15 - 41 U/L   ALT 13 (L) 14 - 54 U/L   Alkaline Phosphatase 61 38 - 126 U/L   Total Bilirubin 0.7 0.3 - 1.2 mg/dL   GFR calc non Af Amer >60 >60 mL/min   GFR calc Af Amer >60 >60 mL/min   Anion gap 6 5 - 15    No results found.  Assessment/Plan: [id number] with menorrhagia desiring definitive tx; complete family status Pt has been counselled regarding procedure, risks, benefits and alternatives and given opportunity to ask and have questions answered over several visits Medical clearance obtained BP stable To OR for Total laparoscopic hysterectomy, possible laparoscopic assisted vaginal hysterectomy, possible open; bilateral salpingectomy, possible  cystoscopy when ready  Jamie Mccarthy 10/04/2015, 2:12 PM

## 2015-10-12 ENCOUNTER — Ambulatory Visit (HOSPITAL_COMMUNITY): Payer: 59 | Admitting: Certified Registered Nurse Anesthetist

## 2015-10-12 ENCOUNTER — Encounter (HOSPITAL_COMMUNITY): Payer: Self-pay | Admitting: *Deleted

## 2015-10-12 ENCOUNTER — Ambulatory Visit (HOSPITAL_COMMUNITY)
Admission: RE | Admit: 2015-10-12 | Discharge: 2015-10-13 | Disposition: A | Discharge status: Home / Self Care | Payer: 59 | Source: Ambulatory Visit | Attending: Obstetrics and Gynecology | Admitting: Obstetrics and Gynecology

## 2015-10-12 ENCOUNTER — Encounter (HOSPITAL_COMMUNITY)
Admission: RE | Disposition: A | Discharge status: Home / Self Care | Payer: Self-pay | Source: Ambulatory Visit | Attending: Obstetrics and Gynecology

## 2015-10-12 DIAGNOSIS — N946 Dysmenorrhea, unspecified: Secondary | ICD-10-CM | POA: Insufficient documentation

## 2015-10-12 DIAGNOSIS — D219 Benign neoplasm of connective and other soft tissue, unspecified: Secondary | ICD-10-CM

## 2015-10-12 DIAGNOSIS — D573 Sickle-cell trait: Secondary | ICD-10-CM | POA: Insufficient documentation

## 2015-10-12 DIAGNOSIS — K219 Gastro-esophageal reflux disease without esophagitis: Secondary | ICD-10-CM | POA: Insufficient documentation

## 2015-10-12 DIAGNOSIS — N920 Excessive and frequent menstruation with regular cycle: Secondary | ICD-10-CM | POA: Diagnosis not present

## 2015-10-12 DIAGNOSIS — D252 Subserosal leiomyoma of uterus: Secondary | ICD-10-CM | POA: Insufficient documentation

## 2015-10-12 DIAGNOSIS — Z90710 Acquired absence of both cervix and uterus: Secondary | ICD-10-CM

## 2015-10-12 HISTORY — PX: LAPAROSCOPIC VAGINAL HYSTERECTOMY WITH SALPINGECTOMY: SHX6680

## 2015-10-12 LAB — PREGNANCY, URINE: Preg Test, Ur: NEGATIVE

## 2015-10-12 SURGERY — HYSTERECTOMY, VAGINAL, LAPAROSCOPY-ASSISTED, WITH SALPINGECTOMY
Anesthesia: General | Site: Abdomen

## 2015-10-12 MED ORDER — FENTANYL CITRATE (PF) 100 MCG/2ML IJ SOLN
INTRAMUSCULAR | Status: AC
  Filled 2015-10-12: qty 2

## 2015-10-12 MED ORDER — ONDANSETRON HCL 4 MG/2ML IJ SOLN
INTRAMUSCULAR | Status: AC
  Filled 2015-10-12: qty 2

## 2015-10-12 MED ORDER — INFLUENZA VAC SPLIT QUAD 0.5 ML IM SUSY: 0.5000 mL | PREFILLED_SYRINGE | INTRAMUSCULAR | Status: DC

## 2015-10-12 MED ORDER — SCOPOLAMINE 1 MG/3DAYS TD PT72
MEDICATED_PATCH | TRANSDERMAL | Status: AC
  Administered 2015-10-12: 1.5 mg via TRANSDERMAL
  Filled 2015-10-12: qty 1

## 2015-10-12 MED ORDER — HYDROMORPHONE HCL 1 MG/ML IJ SOLN
INTRAMUSCULAR | Status: AC
  Administered 2015-10-12: 0.25 mg via INTRAVENOUS
  Filled 2015-10-12: qty 1

## 2015-10-12 MED ORDER — DEXAMETHASONE SODIUM PHOSPHATE 4 MG/ML IJ SOLN
INTRAMUSCULAR | Status: AC
  Filled 2015-10-12: qty 1

## 2015-10-12 MED ORDER — CEFAZOLIN SODIUM-DEXTROSE 2-4 GM/100ML-% IV SOLN
2.0000 g | INTRAVENOUS | Status: AC
  Administered 2015-10-12: 2 g via INTRAVENOUS

## 2015-10-12 MED ORDER — ONDANSETRON HCL 4 MG/2ML IJ SOLN: 4.0000 mg | Freq: Four times a day (QID) | INTRAMUSCULAR | Status: DC | PRN

## 2015-10-12 MED ORDER — PROMETHAZINE HCL 25 MG/ML IJ SOLN
12.5000 mg | Freq: Once | INTRAMUSCULAR | Status: AC | PRN
  Administered 2015-10-12: 6.25 mg via INTRAVENOUS

## 2015-10-12 MED ORDER — SOD CITRATE-CITRIC ACID 500-334 MG/5ML PO SOLN
30.0000 mL | ORAL | Status: AC
  Administered 2015-10-12: 30 mL via ORAL

## 2015-10-12 MED ORDER — KETOROLAC TROMETHAMINE 30 MG/ML IJ SOLN
INTRAMUSCULAR | Status: DC | PRN
  Administered 2015-10-12: 30 mg via INTRAVENOUS

## 2015-10-12 MED ORDER — PROPOFOL 10 MG/ML IV BOLUS
INTRAVENOUS | Status: AC
  Filled 2015-10-12: qty 20

## 2015-10-12 MED ORDER — ONDANSETRON HCL 4 MG PO TABS: 4.0000 mg | ORAL_TABLET | Freq: Four times a day (QID) | ORAL | Status: DC | PRN

## 2015-10-12 MED ORDER — BUPIVACAINE HCL (PF) 0.25 % IJ SOLN
INTRAMUSCULAR | Status: DC | PRN
  Administered 2015-10-12: 10 mL

## 2015-10-12 MED ORDER — MORPHINE SULFATE (PF) 4 MG/ML IV SOLN: 1.0000 mg | INTRAVENOUS | Status: DC | PRN

## 2015-10-12 MED ORDER — PROMETHAZINE HCL 25 MG/ML IJ SOLN: 12.5000 mg | Freq: Once | INTRAMUSCULAR | Status: DC | PRN

## 2015-10-12 MED ORDER — SOD CITRATE-CITRIC ACID 500-334 MG/5ML PO SOLN
ORAL | Status: AC
  Administered 2015-10-12: 30 mL via ORAL
  Filled 2015-10-12: qty 15

## 2015-10-12 MED ORDER — MIDAZOLAM HCL 2 MG/2ML IJ SOLN
INTRAMUSCULAR | Status: AC
  Filled 2015-10-12: qty 2

## 2015-10-12 MED ORDER — MENTHOL 3 MG MT LOZG
1.0000 | LOZENGE | OROMUCOSAL | Status: DC | PRN
  Administered 2015-10-13: 3 mg via ORAL
  Filled 2015-10-12: qty 9

## 2015-10-12 MED ORDER — DEXAMETHASONE SODIUM PHOSPHATE 10 MG/ML IJ SOLN
INTRAMUSCULAR | Status: DC | PRN
  Administered 2015-10-12: 4 mg via INTRAVENOUS

## 2015-10-12 MED ORDER — LACTATED RINGERS IV SOLN
INTRAVENOUS | Status: DC
  Administered 2015-10-12: 125 mL/h via INTRAVENOUS
  Administered 2015-10-12 (×3): via INTRAVENOUS

## 2015-10-12 MED ORDER — PROPOFOL 10 MG/ML IV BOLUS
INTRAVENOUS | Status: DC | PRN
  Administered 2015-10-12: 200 mg via INTRAVENOUS

## 2015-10-12 MED ORDER — LIDOCAINE HCL (CARDIAC) 20 MG/ML IV SOLN
INTRAVENOUS | Status: AC
  Filled 2015-10-12: qty 5

## 2015-10-12 MED ORDER — ROCURONIUM BROMIDE 100 MG/10ML IV SOLN
INTRAVENOUS | Status: DC | PRN
  Administered 2015-10-12: 40 mg via INTRAVENOUS

## 2015-10-12 MED ORDER — ROCURONIUM BROMIDE 100 MG/10ML IV SOLN
INTRAVENOUS | Status: AC
  Filled 2015-10-12: qty 1

## 2015-10-12 MED ORDER — LACTATED RINGERS IV SOLN
INTRAVENOUS | Status: DC
  Administered 2015-10-12 – 2015-10-13 (×2): 100 mL/h via INTRAVENOUS

## 2015-10-12 MED ORDER — KETOROLAC TROMETHAMINE 30 MG/ML IJ SOLN: 30.0000 mg | Freq: Once | INTRAMUSCULAR | Status: DC

## 2015-10-12 MED ORDER — SUGAMMADEX SODIUM 200 MG/2ML IV SOLN
INTRAVENOUS | Status: DC | PRN
  Administered 2015-10-12: 160.6 mg via INTRAVENOUS

## 2015-10-12 MED ORDER — MIDAZOLAM HCL 2 MG/2ML IJ SOLN
INTRAMUSCULAR | Status: DC | PRN
  Administered 2015-10-12: 2 mg via INTRAVENOUS

## 2015-10-12 MED ORDER — LIDOCAINE HCL (CARDIAC) 20 MG/ML IV SOLN
INTRAVENOUS | Status: DC | PRN
  Administered 2015-10-12: 100 mg via INTRAVENOUS

## 2015-10-12 MED ORDER — PROMETHAZINE HCL 25 MG/ML IJ SOLN
6.2500 mg | INTRAMUSCULAR | Status: DC | PRN
  Administered 2015-10-12: 6.25 mg via INTRAVENOUS

## 2015-10-12 MED ORDER — FENTANYL CITRATE (PF) 100 MCG/2ML IJ SOLN
INTRAMUSCULAR | Status: DC | PRN
  Administered 2015-10-12 (×2): 50 ug via INTRAVENOUS
  Administered 2015-10-12: 100 ug via INTRAVENOUS
  Administered 2015-10-12: 50 ug via INTRAVENOUS

## 2015-10-12 MED ORDER — OXYCODONE-ACETAMINOPHEN 5-325 MG PO TABS
1.0000 | ORAL_TABLET | ORAL | Status: DC | PRN
Start: 2015-10-12 — End: 2015-10-13

## 2015-10-12 MED ORDER — SCOPOLAMINE 1 MG/3DAYS TD PT72
1.0000 | MEDICATED_PATCH | Freq: Once | TRANSDERMAL | Status: DC
  Administered 2015-10-12: 1.5 mg via TRANSDERMAL

## 2015-10-12 MED ORDER — IBUPROFEN 600 MG PO TABS: 600.0000 mg | ORAL_TABLET | Freq: Four times a day (QID) | ORAL | Status: DC | PRN

## 2015-10-12 MED ORDER — HYDROMORPHONE HCL 1 MG/ML IJ SOLN
0.2500 mg | INTRAMUSCULAR | Status: DC | PRN
  Administered 2015-10-12 (×3): 0.25 mg via INTRAVENOUS

## 2015-10-12 MED ORDER — PROMETHAZINE HCL 25 MG/ML IJ SOLN
INTRAMUSCULAR | Status: AC
  Administered 2015-10-12: 6.25 mg via INTRAVENOUS
  Filled 2015-10-12: qty 1

## 2015-10-12 MED ORDER — ONDANSETRON HCL 4 MG/2ML IJ SOLN
INTRAMUSCULAR | Status: DC | PRN
  Administered 2015-10-12: 4 mg via INTRAVENOUS

## 2015-10-12 MED ORDER — SUCCINYLCHOLINE CHLORIDE 20 MG/ML IJ SOLN
INTRAMUSCULAR | Status: DC | PRN
  Administered 2015-10-12: 120 mg via INTRAVENOUS

## 2015-10-12 MED ORDER — PANTOPRAZOLE SODIUM 40 MG PO TBEC
40.0000 mg | DELAYED_RELEASE_TABLET | Freq: Every day | ORAL | Status: DC
  Administered 2015-10-13: 40 mg via ORAL
  Filled 2015-10-12: qty 1

## 2015-10-12 MED ORDER — BUPIVACAINE HCL (PF) 0.25 % IJ SOLN
INTRAMUSCULAR | Status: AC
  Filled 2015-10-12: qty 30

## 2015-10-12 SURGICAL SUPPLY — 61 items
BARRIER ADHS 3X4 INTERCEED (GAUZE/BANDAGES/DRESSINGS) IMPLANT
CABLE HIGH FREQUENCY MONO STRZ (ELECTRODE) ×3 IMPLANT
CANISTER SUCT 3000ML (MISCELLANEOUS) ×3 IMPLANT
CATH SILICONE 16FRX5CC (CATHETERS) ×3 IMPLANT
CLOTH BEACON ORANGE TIMEOUT ST (SAFETY) ×3 IMPLANT
COVER MAYO STAND STRL (DRAPES) ×3 IMPLANT
DECANTER SPIKE VIAL GLASS SM (MISCELLANEOUS) ×3 IMPLANT
DEVICE SUTURE ENDOST 10MM (ENDOMECHANICALS) ×3 IMPLANT
DRAPE WARM FLUID 44X44 (DRAPE) IMPLANT
DURAPREP 26ML APPLICATOR (WOUND CARE) ×3 IMPLANT
ENDOSTITCH 0 SINGLE 48 (SUTURE) ×3 IMPLANT
FILTER SMOKE EVAC LAPAROSHD (FILTER) ×3 IMPLANT
GAUZE SPONGE 4X4 16PLY XRAY LF (GAUZE/BANDAGES/DRESSINGS) IMPLANT
GLOVE BIO SURGEON STRL SZ 6.5 (GLOVE) ×9 IMPLANT
GLOVE BIOGEL PI IND STRL 7.0 (GLOVE) ×4 IMPLANT
GLOVE BIOGEL PI INDICATOR 7.0 (GLOVE) ×2
GOWN STRL REUS W/TWL LRG LVL3 (GOWN DISPOSABLE) ×9 IMPLANT
LIQUID BAND (GAUZE/BANDAGES/DRESSINGS) ×3 IMPLANT
NEEDLE HYPO 22GX1.5 SAFETY (NEEDLE) IMPLANT
NS IRRIG 1000ML POUR BTL (IV SOLUTION) ×3 IMPLANT
OCCLUDER COLPOPNEUMO (BALLOONS) ×3 IMPLANT
PACK ABDOMINAL GYN (CUSTOM PROCEDURE TRAY) IMPLANT
PACK LAVH (CUSTOM PROCEDURE TRAY) ×3 IMPLANT
PAD OB MATERNITY 4.3X12.25 (PERSONAL CARE ITEMS) ×3 IMPLANT
PAD TRENDELENBURG POSITION (MISCELLANEOUS) ×3 IMPLANT
PENCIL SMOKE EVAC W/HOLSTER (ELECTROSURGICAL) IMPLANT
POUCH LAPAROSCOPIC INSTRUMENT (MISCELLANEOUS) ×3 IMPLANT
POUCH SPECIMEN RETRIEVAL 10MM (ENDOMECHANICALS) IMPLANT
PROTECTOR NERVE ULNAR (MISCELLANEOUS) ×6 IMPLANT
RTRCTR C-SECT PINK 25CM LRG (MISCELLANEOUS) IMPLANT
SET IRRIG TUBING LAPAROSCOPIC (IRRIGATION / IRRIGATOR) ×3 IMPLANT
SHEARS HARMONIC ACE PLUS 36CM (ENDOMECHANICALS) ×3 IMPLANT
SLEEVE XCEL OPT CAN 5 100 (ENDOMECHANICALS) ×6 IMPLANT
SOLUTION ELECTROLUBE (MISCELLANEOUS) IMPLANT
SPONGE LAP 18X18 X RAY DECT (DISPOSABLE) IMPLANT
STAPLER VISISTAT 35W (STAPLE) IMPLANT
SUT ENDO VLOC 180-0-8IN (SUTURE) ×3 IMPLANT
SUT PDS AB 0 CTX 60 (SUTURE) IMPLANT
SUT VIC AB 0 CT1 18XCR BRD8 (SUTURE) IMPLANT
SUT VIC AB 0 CT1 27 (SUTURE)
SUT VIC AB 0 CT1 27XBRD ANBCTR (SUTURE) IMPLANT
SUT VIC AB 0 CT1 36 (SUTURE) ×9 IMPLANT
SUT VIC AB 0 CT1 8-18 (SUTURE)
SUT VIC AB 2-0 CTB1 (SUTURE) IMPLANT
SUT VIC AB 3-0 PS2 18 (SUTURE) ×1
SUT VIC AB 3-0 PS2 18XBRD (SUTURE) ×2 IMPLANT
SUT VIC AB 4-0 KS 27 (SUTURE) IMPLANT
SUT VICRYL 0 TIES 12 18 (SUTURE) IMPLANT
SUT VICRYL 0 UR6 27IN ABS (SUTURE) IMPLANT
SYR 50ML LL SCALE MARK (SYRINGE) ×3 IMPLANT
SYR CONTROL 10ML LL (SYRINGE) IMPLANT
SYRINGE 10CC LL (SYRINGE) ×3 IMPLANT
SYSTEM CARTER THOMASON II (TROCAR) ×3 IMPLANT
TIP UTERINE 6.7X8CM BLUE DISP (MISCELLANEOUS) ×3 IMPLANT
TOWEL OR 17X24 6PK STRL BLUE (TOWEL DISPOSABLE) ×6 IMPLANT
TRAY FOLEY CATH SILVER 14FR (SET/KITS/TRAYS/PACK) ×3 IMPLANT
TROCAR XCEL NON-BLD 11X100MML (ENDOMECHANICALS) ×3 IMPLANT
TROCAR XCEL NON-BLD 5MMX100MML (ENDOMECHANICALS) ×3 IMPLANT
TROCAR XCEL OPT SLVE 5M 100M (ENDOMECHANICALS) ×6 IMPLANT
WARMER LAPAROSCOPE (MISCELLANEOUS) ×3 IMPLANT
WATER STERILE IRR 1000ML POUR (IV SOLUTION) ×3 IMPLANT

## 2015-10-12 NOTE — Anesthesia Procedure Notes (Signed)
Procedure Name: Intubation Date/Time: 10/12/2015 8:27 AM Performed by: Hewitt Blade Pre-anesthesia Checklist: Patient identified, Emergency Drugs available, Suction available and Patient being monitored Patient Re-evaluated:Patient Re-evaluated prior to inductionOxygen Delivery Method: Circle system utilized Preoxygenation: Pre-oxygenation with 100% oxygen Intubation Type: IV induction, Cricoid Pressure applied and Rapid sequence Laryngoscope Size: Mac and 3 Grade View: Grade I Tube type: Oral Tube size: 7.0 mm Number of attempts: 1 Airway Equipment and Method: Stylet Placement Confirmation: ETT inserted through vocal cords under direct vision,  positive ETCO2 and breath sounds checked- equal and bilateral Secured at: 22 cm Tube secured with: Tape Dental Injury: Teeth and Oropharynx as per pre-operative assessment

## 2015-10-12 NOTE — Anesthesia Postprocedure Evaluation (Signed)
Anesthesia Post Note  Patient: Jamie Mccarthy  Procedure(s) Performed: Procedure(s) (LRB): HYSTERECTOMY TOTAL LAPAROSCOPIC BILATERAL SALPINGECTOMY (Bilateral)  Patient location during evaluation: PACU Anesthesia Type: General Level of consciousness: awake Pain management: pain level controlled Vital Signs Assessment: post-procedure vital signs reviewed and stable Respiratory status: spontaneous breathing Cardiovascular status: stable Postop Assessment: no headache Anesthetic complications: no     Last Vitals:  Vitals:   10/12/15 0705 10/12/15 1114  BP: (!) 147/100 (!) 145/89  Pulse: 83 94  Resp: 18 16  Temp: 36.7 C 37 C    Last Pain:  Vitals:   10/12/15 1114  TempSrc:   PainSc: 6    Pain Goal: Patients Stated Pain Goal: 4 (10/12/15 1114)               EDWARDS,Sh[place]nee High[place]

## 2015-10-12 NOTE — Addendum Note (Signed)
Addendum  created 10/12/15 1531 by Ignacia Bayley, CRNA   Sign clinical note

## 2015-10-12 NOTE — Progress Notes (Signed)
Patient ID: Jamie Mccarthy, female   DOB: [birth year], 40 y.o.   MRN: [id number] Pt doing well. Feels sore as expected. Pain controlled with meds. Ready for reg diet VSS ABD- soft, ND,  EXT - scds in place  A/P: POD #0 s/p TLH/BS         Recovering well         Advance diet as tolerated         Pain control          Routine post op care         Likely discharge to home tomorrow

## 2015-10-12 NOTE — Anesthesia Preprocedure Evaluation (Addendum)
Anesthesia Evaluation  Patient identified by MRN, date of birth, ID band Patient awake    History of Anesthesia Complications (+) PONV  Airway Mallampati: II       Dental   Pulmonary    breath sounds clear to auscultation       Cardiovascular hypertension,  Rhythm:Regular Rate:Normal     Neuro/Psych    GI/Hepatic GERD  ,  Endo/Other  diabetes  Renal/GU      Musculoskeletal   Abdominal   Peds  Hematology   Anesthesia Other Findings   Reproductive/Obstetrics                            Anesthesia Physical Anesthesia Plan  ASA: III  Anesthesia Plan: General   Post-op Pain Management:    Induction: Intravenous  Airway Management Planned: Oral ETT  Additional Equipment:   Intra-op Plan:   Post-operative Plan: Extubation in OR  Informed Consent:   Dental advisory given  Plan Discussed with: CRNA, Anesthesiologist and Surgeon  Anesthesia Plan Comments:         Anesthesia Quick Evaluation

## 2015-10-12 NOTE — Interval H&P Note (Signed)
History and Physical Interval Note:  10/12/2015 7:58 AM  Jamie Mccarthy  has presented today for surgery, with the diagnosis of Menorrhagia, fibroids  The various methods of treatment have been discussed with the patient and family. After consideration of risks, benefits and other options for treatment, the patient has consented to  Procedure(s): HYSTERECTOMY TOTAL LAPAROSCOPIC BILATERAL SALPINGECTOMY (Bilateral) POSSIBLE HYSTERECTOMY ABDOMINAL (N/A) as a surgical intervention .  The patient's history has been reviewed, patient examined, no change in status, stable for surgery.  I have reviewed the patient's chart and labs.  Questions were answered to the patient's satisfaction.     [place]

## 2015-10-12 NOTE — Transfer of Care (Signed)
Immediate Anesthesia Transfer of Care Note  Patient: Jamie Mccarthy  Procedure(s) Performed: Procedure(s): HYSTERECTOMY TOTAL LAPAROSCOPIC BILATERAL SALPINGECTOMY (Bilateral)  Patient Location: PACU  Anesthesia Type:General  Level of Consciousness: awake, alert  and oriented  Airway & Oxygen Therapy: Patient Spontanous Breathing and Patient connected to nasal cannula oxygen  Post-op Assessment: Report given to RN, Post -op Vital signs reviewed and stable and Patient moving all extremities  Post vital signs: Reviewed and stable  Last Vitals:  Vitals:   10/12/15 0705  BP: (!) 147/100  Pulse: 83  Resp: 18  Temp: 36.7 C    Last Pain:  Vitals:   10/12/15 0705  TempSrc: Oral      Patients Stated Pain Goal: 4 ([id number])  Complications: No apparent anesthesia complications

## 2015-10-12 NOTE — Op Note (Signed)
Operative Note    Preoperative Diagnosis: menorrhagia 2. Fibroid uterus 3. dysmenorrhea   Postoperative Diagnosis: same   Procedure: TLH/BS   Surgeon: Dr Terri Piedra Assist: Dr Marvel Plan  Anesthesia: general  Fluids: LR 2510ml EBL: 370ml  UOP: 446ml  Findings: Fibroid uterus, mild scarring; grossly nl b/l ovaries Incidental note of lab band in upper right abd quadrant - intraop consult with Dr Excell Seltzer to confirm nl placement   Specimen: uterus, bilateral tubes, cervix   Procedure Note Pt taken to operating room and placed in dorsal lithotomy position with her arms safely positioned at her sides. General anesthesia was administered and found to be adequate. Pt was prepped and draped in sterile fashion and a timeout performed. A weighted speculum was placed in the posterior fornix and a sim retractor placed anteriorly. Excellent visualization of the cervix was obtained. Uterus was sounded to 9cm so a size 8 koh was assembled with a small fcup and placed with retention sutures at 3 and 9 oclock. A foley catheter was also placed in a sterile fashion Legs were then lowered and attention turned to abdomen.  Here a 22mm incision was made at the umbilicus. A 53mm optiview trocar was then placed with the abdomen tented upwards. The laparoscopic camera was used to confirm placement and pneumoperitoneum obtained with CO2 gas to 51mmHg. The patient was placed in trendelenberg and gross survey of pelvis done.The uterus was noted to have a large right fundal fibroid and a much smaller left lateral fibroid. There was some mild adhesions of omentum to anterior abdominal wall otherwise no abnormalities were noted. Another 78mm port was then placed under direct visualization in the right lower quadrant and a 11 mm port in the left lower quadrant  with care taken to avoid the epigastric vessels.  The left fallopian tube was then grasped with a blunt grasper, elevated and excised using the harmonic  hemostatically. Next the utero-ovarian ligament and the round ligaments were sequentially grasped and excised. The broad ligament was then separated from the uterus as well with a bladder flap created. The cardinal ligament was then excised next at the utero-cervical junction and the ovarian vessel clamped, cauterized and cut. The same was done on the patients right with similar results.  Next the bladder reflection was dissected off the cervix as well as the posterior leaflet. The vaginal occluder was filled with 60cc of saline and starting anteriorly and working laterally the uterus and cervix were amputated off the superior vagina. The uterus, cervix and fallopian tubes were removed.  The angles of the vaginal cuff were identified and using the endostitch with a 0 vicryl v-lock suture, the cuff was closed in a running fashion with 3-4 back stitches to ensure closure. After irrigation of the pelvis, no bleeding was noted.  A final look into the abdominal cavity confirmed no abnormalities or bleeding. In incidental note was made of a large tube in the right upper quandrant with mild omental adhesions- intraop consult with general surgery confirmed this was her lab band and placement was nl.  Hence patient was flattened. The 65mm port site was closed with 0-vicryl suture using the carter thomason under visualization to ensure closure of the peritoneum.  The remaining  trocars were removed under direct visualization and gas allowed to escape.  Incision sites were closed with 4-0 vicryl suture and dermabond. Counts were noted to be correct. Patient was awakened and taken to recovery room in stable status.  Foley was left in place

## 2015-10-12 NOTE — Anesthesia Postprocedure Evaluation (Signed)
Anesthesia Post Note  Patient: Jamie Mccarthy  Procedure(s) Performed: Procedure(s) (LRB): HYSTERECTOMY TOTAL LAPAROSCOPIC BILATERAL SALPINGECTOMY (Bilateral)  Patient location during evaluation: [facility] Anesthesia Type: General Level of consciousness: awake Pain management: pain level controlled Vital Signs Assessment: post-procedure vital signs reviewed and stable Respiratory status: spontaneous breathing Cardiovascular status: stable Postop Assessment: no signs of nausea or vomiting, adequate PO intake and patient able to bend at knees Anesthetic complications: no     Last Vitals:  Vitals:   10/12/15 1419 10/12/15 1518  BP: 136/80 136/78  Pulse: 95 87  Resp:  16  Temp: 37.4 C 37 C    Last Pain:  Vitals:   10/12/15 1518  TempSrc: Oral  PainSc:    Pain Goal: Patients Stated Pain Goal: 4 (10/12/15 1419)               Rickayla Wieland

## 2015-10-13 ENCOUNTER — Encounter (HOSPITAL_COMMUNITY): Payer: Self-pay | Admitting: Obstetrics and Gynecology

## 2015-10-13 DIAGNOSIS — N920 Excessive and frequent menstruation with regular cycle: Secondary | ICD-10-CM | POA: Diagnosis not present

## 2015-10-13 LAB — CBC
HEMATOCRIT: 27.6 % — AB (ref 36.0–46.0)
HEMOGLOBIN: 9.4 g/dL — AB (ref 12.0–15.0)
MCH: 24.4 pg — AB (ref 26.0–34.0)
MCHC: 34.1 g/dL (ref 30.0–36.0)
MCV: 71.5 fL — ABNORMAL LOW (ref 78.0–100.0)
Platelets: 255 10*3/uL (ref 150–400)
RBC: 3.86 MIL/uL — AB (ref 3.87–5.11)
RDW: 14.9 % (ref 11.5–15.5)
WBC: 14.2 10*3/uL — ABNORMAL HIGH (ref 4.0–10.5)

## 2015-10-13 LAB — COMPREHENSIVE METABOLIC PANEL
ALBUMIN: 3.1 g/dL — AB (ref 3.5–5.0)
ALK PHOS: 46 U/L (ref 38–126)
ALT: 12 U/L — AB (ref 14–54)
AST: 18 U/L (ref 15–41)
Anion gap: 6 (ref 5–15)
BILIRUBIN TOTAL: 1.4 mg/dL — AB (ref 0.3–1.2)
BUN: <5 mg/dL — ABNORMAL LOW (ref 6–20)
CALCIUM: 8.4 mg/dL — AB (ref 8.9–10.3)
CO2: 27 mmol/L (ref 22–32)
CREATININE: 0.73 mg/dL (ref 0.44–1.00)
Chloride: 107 mmol/L (ref 101–111)
GFR calc non Af Amer: >60 mL/min (ref >60)
GLUCOSE: 90 mg/dL (ref 65–99)
Potassium: 3 mmol/L — ABNORMAL LOW (ref 3.5–5.1)
SODIUM: 140 mmol/L (ref 135–145)
Total Protein: 5.8 g/dL — ABNORMAL LOW (ref 6.5–8.1)

## 2015-10-13 MED ORDER — IBUPROFEN 600 MG PO TABS: 600.0000 mg | ORAL_TABLET | Freq: Four times a day (QID) | ORAL | 1 refills | Status: DC | PRN

## 2015-10-13 MED ORDER — OXYCODONE-ACETAMINOPHEN 5-325 MG PO TABS: 1.0000 | ORAL_TABLET | ORAL | 0 refills | Status: DC | PRN

## 2015-10-13 NOTE — Discharge Summary (Signed)
Physician Discharge Summary  Patient ID: Jamie Mccarthy MRN: [id number] DOB/AGE: [birth year] 38 y.o.  Admit date: 10/12/2015 Discharge date: 10/13/2015  Admission Diagnoses:  Discharge Diagnoses:  Active Problems:   Menorrhagia   Fibroid   S/P laparoscopic hysterectomy   Discharged Condition: stable  Hospital Course: Pt recovered well post op. Ambulated and tolerated diet well. Pain well controlled.   Consults: None  Significant Diagnostic Studies: labs: stable  Treatments: analgesia: ibuprofen and percocet  Discharge Exam: Blood pressure 135/90, pulse 86, temperature 98.4 F (36.9 C), temperature source Oral, resp. rate 16, height 5\' 4"  (1.626 m), weight 175 lb (79.4 kg), last menstrual period 08/29/2015, SpO2 100 %. General appearance: alert, cooperative and no distress GI: soft, non-tender; bowel sounds normal; no masses,  no organomegaly Extremities: extremities normal, atraumatic, no cyanosis or edema  Disposition: 01-Home or Self Care  Discharge Instructions    Call MD for:  difficulty breathing, headache or visual disturbances    Complete by:  As directed    Call MD for:  persistant nausea and vomiting    Complete by:  As directed    Call MD for:  redness, tenderness, or signs of infection (pain, swelling, redness, odor or green/yellow discharge around incision site)    Complete by:  As directed    Call MD for:  severe uncontrolled pain    Complete by:  As directed    Call MD for:  temperature >100.4    Complete by:  As directed    Diet - low sodium heart healthy    Complete by:  As directed    Discharge instructions    Complete by:  As directed    No sex, tampons, and douching.   Driving Restrictions    Complete by:  As directed    None while taking narcotic pain meds   Increase activity slowly    Complete by:  As directed    Lifting restrictions    Complete by:  As directed    Nothing heavier than 15lbs   Sexual Activity Restrictions    Complete by:   As directed    No sex, tampons, and douching.       Medication List    TAKE these medications   ibuprofen 600 MG tablet Commonly known as:  ADVIL,MOTRIN Take 1 tablet (600 mg total) by mouth every 6 (six) hours as needed (mild pain). What changed:  See the new instructions.   oxyCODONE-acetaminophen 5-325 MG tablet Commonly known as:  PERCOCET/ROXICET Take 1-2 tablets by mouth every 4 (four) hours as needed for severe pain (moderate to severe pain (when tolerating fluids)).      Follow-up Information    [place], DO. Call in 2 week(s).   Specialty:  Obstetrics and Gynecology Why:  For post op incision check and 6weeks for post op care Contact information: [place] [id number] [phone]           Signed: Sherlyn Hay 10/13/2015, 9:10 AM

## 2015-10-13 NOTE — Progress Notes (Signed)
1 Day Post-Op Procedure(s) (LRB): HYSTERECTOMY TOTAL LAPAROSCOPIC BILATERAL SALPINGECTOMY (Bilateral)  Subjective: Patient reports tolerating PO and + flatus.Had small amount of nausea after PO last night; none today . Ambulating in hallways with no issues. Foley still in place. Tolerated PO with no issues. Not taking pain medication - tolerating pain well. No fever/chills  Objective: I have reviewed patient's vital signs, intake and output, medications and labs.  General: alert, cooperative and no distress GI: soft, non-tender; bowel sounds normal; no masses,  no organomegaly and incision: clean, dry and intact Extremities: extremities normal, atraumatic, no cyanosis or edema  Assessment: s/p Procedure(s): HYSTERECTOMY TOTAL LAPAROSCOPIC BILATERAL SALPINGECTOMY (Bilateral): stable and tolerating diet  Plan: Routine post op care  Remove foley May be discharged to home once voiding well Instructions reviewed   LOS: 0 days    [place] 10/13/2015, 8:54 AM

## 2015-10-13 NOTE — Progress Notes (Signed)
Discharge teaching complete. Pt understood all information and did not have any questions. Pt pushed via wheelchair and discharged home to family. 

## 2015-10-13 NOTE — Discharge Instructions (Signed)
No sex, tampons, and douching.  Other instructions as in [employer].

## 2015-10-21 ENCOUNTER — Other Ambulatory Visit: Payer: Self-pay | Admitting: Family Medicine

## 2015-10-25 ENCOUNTER — Ambulatory Visit (INDEPENDENT_AMBULATORY_CARE_PROVIDER_SITE_OTHER): Payer: 59 | Admitting: Family Medicine

## 2015-10-25 ENCOUNTER — Encounter: Payer: Self-pay | Admitting: Family Medicine

## 2015-10-25 VITALS — BP 129/84 | HR 75 | Wt 177.0 lb

## 2015-10-25 DIAGNOSIS — R635 Abnormal weight gain: Secondary | ICD-10-CM

## 2015-10-25 DIAGNOSIS — Z23 Encounter for immunization: Secondary | ICD-10-CM

## 2015-10-25 DIAGNOSIS — Z6830 Body mass index (BMI) 30.0-30.9, adult: Secondary | ICD-10-CM

## 2015-10-25 MED ORDER — LIRAGLUTIDE -WEIGHT MANAGEMENT 18 MG/3ML ~~LOC~~ SOPN: 2.4000 mg | PEN_INJECTOR | Freq: Every day | SUBCUTANEOUS | 1 refills | Status: DC

## 2015-10-25 NOTE — Progress Notes (Signed)
Subjective:    CC:   HPI:  40 yo female comes in today.  She had her hysterectomy recently and did well. Still has some bloating. He also has had a little irritation around her umbilicus. She thinks it was from the taper something that they use. She says been draining and have a little bit of an odor but she is following up with her GYN tomorrow. She is on Saxenda and doing well. She has gained 2 lbs since last here but hasn't been able to exercise.  She is actually lost a total of about 75 pounds over the last year. That she follow back up with her bariatric surgeon and they decided not to fill her port further she was doing so well with her weight loss.  Past medical history, Surgical history, Family history not pertinant except as noted below, Social history, Allergies, and medications have been entered into the medical record, reviewed, and corrections made.   Review of Systems: No fevers, chills, night sweats, weight loss, chest pain, or shortness of breath.   Objective:    General: Well Developed, well nourished, and in no acute distress.  Neuro: Alert and oriented x3, extra-ocular muscles intact, sensation grossly intact.  HEENT: Normocephalic, atraumatic  Skin: Warm and dry, no rashes. Cardiac: Regular rate and rhythm, no murmurs rubs or gallops, no lower extremity edema.  Respiratory: Clear to auscultation bilaterally. Not using accessory muscles, speaking in full sentences.   Impression and Recommendations:    Abnormal weight gain/BMI 30-continue with Sexenda. Doing fantastic. Follow-up in 3 months.Albertina Parr exercise as soon as she has been released from her GYN for full activity.   Flu vac given

## 2016-01-04 ENCOUNTER — Other Ambulatory Visit: Payer: Self-pay | Admitting: Family Medicine

## 2016-01-25 ENCOUNTER — Encounter: Payer: Self-pay | Admitting: Family Medicine

## 2016-01-25 ENCOUNTER — Ambulatory Visit (INDEPENDENT_AMBULATORY_CARE_PROVIDER_SITE_OTHER): Payer: 59 | Admitting: Family Medicine

## 2016-01-25 VITALS — BP 137/86 | HR 62 | Ht 64.0 in | Wt 163.0 lb

## 2016-01-25 DIAGNOSIS — M533 Sacrococcygeal disorders, not elsewhere classified: Secondary | ICD-10-CM | POA: Diagnosis not present

## 2016-01-25 DIAGNOSIS — M7061 Trochanteric bursitis, right hip: Secondary | ICD-10-CM

## 2016-01-25 DIAGNOSIS — M25551 Pain in right hip: Secondary | ICD-10-CM | POA: Diagnosis not present

## 2016-01-25 DIAGNOSIS — R635 Abnormal weight gain: Secondary | ICD-10-CM

## 2016-01-25 DIAGNOSIS — R209 Unspecified disturbances of skin sensation: Secondary | ICD-10-CM

## 2016-01-25 MED ORDER — LIRAGLUTIDE -WEIGHT MANAGEMENT 18 MG/3ML ~~LOC~~ SOPN: 2.4000 mg | PEN_INJECTOR | Freq: Every day | SUBCUTANEOUS | 1 refills | Status: DC

## 2016-01-25 NOTE — Progress Notes (Addendum)
Subjective:    Patient ID: Jamie Mccarthy, female    DOB: [birth year], 41 y.o.   MRN: [id number]  HPI Three-month follow-up for abnormal weight gain. She is currently on Sexenda. She has lost 14 more lbs.  She is doing great on it. No sig nausea.  She is actually been eating breakfast. And she's been eating small meals with snacks in between. She actually feels like she's been more hungry but feels like she's been making healthier food choices.  C/O or right hip pain since she has her hystrectomy.  Feels stiff at times.  Pain in 2-3/10.  When walking feels like she might lose her balance.  Says painful to sleep on that side at night. She's also been having some persistent right low back pain since having her hysterectomy as well. She thought it was just soreness from lying on her back in the operating room but says it has continued to bother her and feels sore. Her pain in the right hip is worse with prolonged sitting as well. Again no trauma or injury.  She also complains of cold fingertips. She says they start to feel cold and a negative go numb and turn white. She said this started about a month ago. It's been happening on her toes as well. No injury or trauma. She says that her mother was diagnosed with Raynaud's.    Review of Systems  BP 137/86   Pulse 62   Ht 5\' 4"  (1.626 m)   Wt 163 lb (73.9 kg)   SpO2 100%   BMI 27.98 kg/m     Allergies  Allergen Reactions  . Latex Itching    Pt states she gets severely itchy with latex  . Naproxen Rash    Past Medical History:  Diagnosis Date  . Anemia    Has sickle cell trait  . GERD (gastroesophageal reflux disease)    diet controlled  . Gestational diabetes   . H/O vaginal delivery 2010  . Headache(784.0)    Migraines  . Herpes 2007   hx of  . Hypertension    no current medications  . Iron deficiency anemia, unspecified 10/20/2012  . PONV (postoperative nausea and vomiting)    has had PONV with previous c/s and also lap band  surgery 2004  . Seasonal allergies     Past Surgical History:  Procedure Laterality Date  . BILATERAL SALPINGECTOMY  07/16/2012   Procedure: BILATERAL DISTAL SALPINGECTOMY;  Surgeon: Logan Bores, MD;  Location: [facility];  Service: Obstetrics;;  . CESAREAN SECTION  2012  . CESAREAN SECTION N/A 07/16/2012   Procedure: REPEAT CESAREAN SECTION;  Surgeon: Logan Bores, MD;  Location: [facility];  Service: Obstetrics;  Laterality: N/A;  . CHOLECYSTECTOMY N/A 11/14/2012   Procedure: LAPAROSCOPIC CHOLECYSTECTOMY;  Surgeon: Gayland Curry, MD;  Location: [facility];  Service: General;  Laterality: N/A;  . GASTRIC BANDING PORT REVISION N/A 11/14/2012   Procedure: GASTRIC BANDING PORT REVISION;  Surgeon: Gayland Curry, MD;  Location: [facility];  Service: General;  Laterality: N/A;  PORT PLACEMENT MOVED NEW MESH INSERTED   . LAPAROSCOPIC GASTRIC BANDING  2005  . LAPAROSCOPIC VAGINAL HYSTERECTOMY WITH SALPINGECTOMY Bilateral 10/12/2015   Procedure: HYSTERECTOMY TOTAL LAPAROSCOPIC BILATERAL SALPINGECTOMY;  Surgeon: Sherlyn Hay, DO;  Location: [facility];  Service: Gynecology;  Laterality: Bilateral;  . reposition gastric band  2006   x2  . TUBAL LIGATION  2014  . [place]  History   Social History  . Marital status: Married    Spouse name: N/A  . Number of children: 1  . Years of education: N/A   Occupational History  . [employer]   Social History Main Topics  . Smoking status: Never Smoker  . Smokeless tobacco: Never Used  . Alcohol use 0.0 oz/week     Comment: occ  . Drug use: No  . Sexual activity: Yes     Comment: separated, 2 caffeine drinks daily.    Other Topics Concern  . Not on file   Social History Narrative   2 caffeine drinks per day    Family History  Problem Relation Age of Onset  . Hypertension Mother   . Diabetes      father's family  . Thyroid cancer    . Breast cancer Maternal Grandmother     Outpatient  Encounter Prescriptions as of 01/25/2016  Medication Sig  . B-D ULTRAFINE III SHORT PEN 31G X 8 MM MISC USE TO INJECT INTO SKIN DAILY AS DIRECTED  . levocetirizine (XYZAL) 5 MG tablet   . Liraglutide -Weight Management (SAXENDA) 18 MG/3ML SOPN Inject 2.4 mg into the skin daily.  . [DISCONTINUED] ibuprofen (ADVIL,MOTRIN) 600 MG tablet Take 1 tablet (600 mg total) by mouth every 6 (six) hours as needed (mild pain).  . [DISCONTINUED] Liraglutide -Weight Management (SAXENDA) 18 MG/3ML SOPN Inject 2.4 mg into the skin daily.  . [DISCONTINUED] oxyCODONE-acetaminophen (PERCOCET/ROXICET) 5-325 MG tablet Take 1-2 tablets by mouth every 4 (four) hours as needed for severe pain (moderate to severe pain (when tolerating fluids)).   No facility-administered encounter medications on file as of 01/25/2016.          Objective:   Physical Exam  Constitutional: She is oriented to person, place, and time. She appears well-developed and well-nourished.  HENT:  Head: Normocephalic and atraumatic.  Cardiovascular: Normal rate, regular rhythm and normal heart sounds.   Pulmonary/Chest: Effort normal and breath sounds normal.  Musculoskeletal:  Right hip with normal range of motion. Tender over the greater trochanter. Strength is 5 out of 5 in all directions.Marland Kitchen He is very tender over the right SI joint.neg straight leg rasie.   Neurological: She is alert and oriented to person, place, and time.  Skin: Skin is warm and dry.  No discoloration of fingertips today on exam.  No deformity of fingers.   Psychiatric: She has a normal mood and affect. Her behavior is normal.          Assessment & Plan:  Abnormal weight gain-discussed goal weight of 140-145. I think she would do well at that weight.Continue to work on healthy diet and regular exercise.  Right trochanteric bursitis-discussed diagnosis. Recommend home physical therapy. Because of her gastric banding I recommend that she avoid NSAIDs and just use Tylenol  as needed for pain relief. Reaming then consider injection.  Right SI joint pain-given handout with stretches to do on her own at home. Again if not improving them please let me know.  Cold fingertips-at this point she's only had symptoms for about a month. We certainly can go ahead and move forward with some additional blood work to struggle some other causes out. She will need to have the symptoms a little longer to actually qualify for the diagnosis of Raynaud's that we would differently keep an eye on this. In the meantime encourage her to just try to keep her hands as warm as possible.

## 2016-03-06 ENCOUNTER — Encounter: Payer: Self-pay | Admitting: Family Medicine

## 2016-03-06 ENCOUNTER — Ambulatory Visit (INDEPENDENT_AMBULATORY_CARE_PROVIDER_SITE_OTHER): Payer: Commercial Managed Care - HMO | Admitting: Family Medicine

## 2016-03-06 VITALS — BP 114/82 | HR 72 | Ht 64.0 in | Wt 161.0 lb

## 2016-03-06 DIAGNOSIS — M533 Sacrococcygeal disorders, not elsewhere classified: Secondary | ICD-10-CM

## 2016-03-06 DIAGNOSIS — R635 Abnormal weight gain: Secondary | ICD-10-CM | POA: Diagnosis not present

## 2016-03-06 DIAGNOSIS — Z6827 Body mass index (BMI) 27.0-27.9, adult: Secondary | ICD-10-CM | POA: Diagnosis not present

## 2016-03-06 DIAGNOSIS — M7061 Trochanteric bursitis, right hip: Secondary | ICD-10-CM

## 2016-03-06 HISTORY — DX: Trochanteric bursitis, right hip: M70.61

## 2016-03-06 LAB — CBC WITH DIFFERENTIAL/PLATELET
BASOS PCT: 0 %
Basophils Absolute: 0 cells/uL (ref 0–200)
EOS ABS: 186 {cells}/uL (ref 15–500)
Eosinophils Relative: 3 %
HCT: 34.7 % — ABNORMAL LOW (ref 35.0–45.0)
Hemoglobin: 10.7 g/dL — ABNORMAL LOW (ref 11.7–15.5)
Lymphocytes Relative: 50 %
Lymphs Abs: 3100 cells/uL (ref 850–3900)
MCH: 22.2 pg — ABNORMAL LOW (ref 27.0–33.0)
MCHC: 30.8 g/dL — ABNORMAL LOW (ref 32.0–36.0)
MCV: 72.1 fL — AB (ref 80.0–100.0)
MONOS PCT: 6 %
MPV: 9.7 fL (ref 7.5–12.5)
Monocytes Absolute: 372 cells/uL (ref 200–950)
Neutro Abs: 2542 cells/uL (ref 1500–7800)
Neutrophils Relative %: 41 %
PLATELETS: 363 10*3/uL (ref 140–400)
RBC: 4.81 MIL/uL (ref 3.80–5.10)
RDW: 18.3 % — ABNORMAL HIGH (ref 11.0–15.0)
WBC: 6.2 10*3/uL (ref 3.8–10.8)

## 2016-03-06 LAB — URINALYSIS
BILIRUBIN URINE: NEGATIVE
GLUCOSE, UA: NEGATIVE
HGB URINE DIPSTICK: NEGATIVE
Ketones, ur: NEGATIVE
LEUKOCYTES UA: NEGATIVE
Nitrite: NEGATIVE
PROTEIN: NEGATIVE
Specific Gravity, Urine: 1.018 (ref 1.001–1.035)
pH: 5.5 (ref 5.0–8.0)

## 2016-03-06 MED ORDER — INSULIN PEN NEEDLE 31G X 4 MM MISC: 1.0000 | Freq: Every day | 99 refills | Status: DC

## 2016-03-06 NOTE — Progress Notes (Signed)
Subjective:    CC: Hip and Back pain   HPI:  41 year old female came in about a month ago complaining of right hip and low back pain that it's been present since her hysterectomy.Jamie Mccarthy Upon further examination she had symptoms consistent with right trochanteric bursitis as well as right SI joint inflammation. Gave her handout on some exercises and instructions to do at home. Because of gastric banding I recommended that she actually avoid NSAIDs and just use Tylenol for pain relief. She is here to follow-up today.   Abnormal weight gain - she is back up to 2.4mg  on the Saxenda.  Had a stomach bug over the weekend but o/w is doing well on it. Tolerating it well without any side effects. She continues to eat healthy and try to get regular exercise.  Past medical history, Surgical history, Family history not pertinant except as noted below, Social history, Allergies, and medications have been entered into the medical record, reviewed, and corrections made.   Review of Systems: No fevers, chills, night sweats, weight loss, chest pain, or shortness of breath.   Objective:    General: Well Developed, well nourished, and in no acute distress.  Neuro: Alert and oriented x3, extra-ocular muscles intact, sensation grossly intact.  HEENT: Normocephalic, atraumatic  Skin: Warm and dry, no rashes. Cardiac: Regular rate and rhythm, no murmurs rubs or gallops, no lower extremity edema.  Respiratory: Clear to auscultation bilaterally. Not using accessory muscles, speaking in full sentences. MSK: Normal lumbar flexion and extension. Nontender over the lumbar spine. Nontender over the SI joints today but she is tender over the coccyx. Still mildly tender over the right trochanteric bursa but improved from previous. Hip, knee, and strength is 505 bilaterally. Patellar reflexes 1+ bilaterally.   Impression and Recommendations:    Right trochanteric bursitis-recommend referral for formal physical therapy as she  did have some response to home therapy.  Coccydynia-complaining of pain directly over the tailbone. She is tender directly over the tailbone. Refer to formal physical therapy.  Abnormal weight gain/BMI 27-she is down 2 more pounds in the last month. Follow-up in 3 months. Continue work on [employer] and regular exercise. She would like to get down to about 155 pounds. continue Sexenda. Okay to decrease dose if starting to get nauseated again.

## 2016-03-07 ENCOUNTER — Ambulatory Visit: Payer: 59 | Admitting: Family Medicine

## 2016-03-07 LAB — SEDIMENTATION RATE: SED RATE: 4 mm/h (ref 0–20)

## 2016-03-07 LAB — ANA: ANA: NEGATIVE

## 2016-03-07 LAB — C-REACTIVE PROTEIN: CRP: 0.8 mg/L (ref <8.0)

## 2016-03-07 LAB — TSH: TSH: 0.75 mIU/L

## 2016-03-09 ENCOUNTER — Other Ambulatory Visit: Payer: Self-pay | Admitting: *Deleted

## 2016-03-09 DIAGNOSIS — R790 Abnormal level of blood mineral: Secondary | ICD-10-CM

## 2016-03-28 ENCOUNTER — Ambulatory Visit: Payer: Commercial Managed Care - HMO | Admitting: Rehabilitative and Restorative Service Providers"

## 2016-04-06 ENCOUNTER — Emergency Department
Admission: EM | Admit: 2016-04-06 | Discharge: 2016-04-06 | Disposition: A | Discharge status: Home / Self Care | Payer: Commercial Managed Care - HMO | Source: Home / Self Care | Attending: Family Medicine | Admitting: Family Medicine

## 2016-04-06 ENCOUNTER — Encounter: Payer: Self-pay | Admitting: Emergency Medicine

## 2016-04-06 ENCOUNTER — Ambulatory Visit: Payer: Commercial Managed Care - HMO | Admitting: Physician Assistant

## 2016-04-06 DIAGNOSIS — J069 Acute upper respiratory infection, unspecified: Secondary | ICD-10-CM

## 2016-04-06 DIAGNOSIS — B9789 Other viral agents as the cause of diseases classified elsewhere: Secondary | ICD-10-CM | POA: Diagnosis not present

## 2016-04-06 MED ORDER — GUAIFENESIN-CODEINE 100-10 MG/5ML PO SOLN: ORAL | 0 refills | Status: DC

## 2016-04-06 MED ORDER — AZITHROMYCIN 250 MG PO TABS: ORAL_TABLET | ORAL | 0 refills | Status: DC

## 2016-04-06 MED ORDER — PREDNISONE 20 MG PO TABS: ORAL_TABLET | ORAL | 0 refills | Status: DC

## 2016-04-06 NOTE — ED Triage Notes (Signed)
Cough, chills, body aches, congestion, fatigue x 4 days

## 2016-04-06 NOTE — ED Provider Notes (Addendum)
[facility]    CSN: [id number] Arrival date & time: 04/06/16  1024     History   Chief Complaint Chief Complaint  Patient presents with  . Cough    HPI Jamie Mccarthy is a 41 y.o. female.   Patient complains of three day history of typical cold-like symptoms developing over several days, including mild sore throat, sinus congestion, headache, fatigue, and cough.  She has had chills without fever.  No pleuritic pain.   No fevers, chills, and sweats                                             The history is provided by the patient.    Past Medical History:  Diagnosis Date  . Anemia    Has sickle cell trait  . GERD (gastroesophageal reflux disease)    diet controlled  . Gestational diabetes   . H/O vaginal delivery 2010  . Headache(784.0)    Migraines  . Herpes 2007   hx of  . Hypertension    no current medications  . Iron deficiency anemia, unspecified 10/20/2012  . PONV (postoperative nausea and vomiting)    has had PONV with previous c/s and also lap band surgery 2004  . Seasonal allergies     Patient Active Problem List   Diagnosis Date Noted  . Trochanteric bursitis, right hip 03/06/2016  . Menorrhagia 10/12/2015  . Fibroid 10/12/2015  . S/P laparoscopic hysterectomy 10/12/2015  . Lumbosacral strain 08/18/2014  . Iron deficiency anemia, unspecified 10/20/2012  . H/O laparoscopic adjustable gastric banding 02/2003 10/02/2012  . Regurgitation 10/02/2012  . S/P repeat low transverse C-section 07/17/2012  . Sickle cell trait ([place]) 12/10/2011  . Essential hypertension 09/11/2010  . Allergic rhinitis 06/14/2010  . GERD 12/27/2008  . OBESITY NOS 12/03/2005  . 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 RHINITIS, ALLERGIC 10/30/2005    Past Surgical History:  Procedure Laterality Date  . BILATERAL SALPINGECTOMY  07/16/2012   Procedure: BILATERAL DISTAL SALPINGECTOMY;  Surgeon: Logan Bores, MD;  Location: [facility];  Service: Obstetrics;;  . CESAREAN  SECTION  2012  . CESAREAN SECTION N/A 07/16/2012   Procedure: REPEAT CESAREAN SECTION;  Surgeon: Logan Bores, MD;  Location: [facility];  Service: Obstetrics;  Laterality: N/A;  . CHOLECYSTECTOMY N/A 11/14/2012   Procedure: LAPAROSCOPIC CHOLECYSTECTOMY;  Surgeon: Gayland Curry, MD;  Location: [facility];  Service: General;  Laterality: N/A;  . GASTRIC BANDING PORT REVISION N/A 11/14/2012   Procedure: GASTRIC BANDING PORT REVISION;  Surgeon: Gayland Curry, MD;  Location: [facility];  Service: General;  Laterality: N/A;  PORT PLACEMENT MOVED NEW MESH INSERTED   . LAPAROSCOPIC GASTRIC BANDING  2005  . LAPAROSCOPIC VAGINAL HYSTERECTOMY WITH SALPINGECTOMY Bilateral 10/12/2015   Procedure: HYSTERECTOMY TOTAL LAPAROSCOPIC BILATERAL SALPINGECTOMY;  Surgeon: Sherlyn Hay, DO;  Location: [facility];  Service: Gynecology;  Laterality: Bilateral;  . reposition gastric band  2006   x2  . TUBAL LIGATION  2014  . WISDOM TOOTH EXTRACTION  1999    OB History    Gravida Para Term Preterm AB Living   3 3 2 1  0 4   SAB TAB Ectopic Multiple Live Births   0 0 0 1 4       Home Medications    Prior to Admission medications   Medication Sig Start Date End Date Taking?  Authorizing Provider  azithromycin (ZITHROMAX Z-PAK) 250 MG tablet Take 2 tabs today; then begin one tab once daily for 4 more days. (Rx void after 04/14/16) 04/06/16   Kandra Nicolas, MD  guaiFENesin-codeine 100-10 MG/5ML syrup Take 31mL by mouth at bedtime as needed for cough 04/06/16   Kandra Nicolas, MD  Insulin Pen Needle 31G X 4 MM MISC 1 each by Does not apply route daily. Use to inject into skin daily as directed 03/06/16   Hali Marry, MD  levocetirizine Harlow Ohms) 5 MG tablet  09/10/15   Historical Provider, MD  Liraglutide -Weight Management (SAXENDA) 18 MG/3ML SOPN Inject 2.4 mg into the skin daily. 01/25/16   Hali Marry, MD  predniSONE (DELTASONE) 20 MG tablet Take one tab by mouth twice daily for 4 days, then one daily  for 3 days. Take with food. 04/06/16   Kandra Nicolas, MD    Family History Family History  Problem Relation Age of Onset  . Hypertension Mother   . Diabetes      father's family  . Thyroid cancer    . Breast cancer Maternal Grandmother     Social History Social History  Substance Use Topics  . Smoking status: Never Smoker  . Smokeless tobacco: Never Used  . Alcohol use 0.0 oz/week     Comment: occ     Allergies   Latex and Naproxen   Review of Systems Review of Systems + sore throat + cough No pleuritic pain No wheezing + nasal congestion + post-nasal drainage No sinus pain/pressure No itchy/red eyes ? earache No hemoptysis No SOB No fever, + chills No nausea No vomiting No abdominal pain No diarrhea No urinary symptoms No skin rash + fatigue No myalgias + headache Used OTC meds without relief   Physical Exam Triage Vital Signs ED Triage Vitals  Enc Vitals Group     BP 04/06/16 1103 122/83     Pulse Rate 04/06/16 1103 88     Resp --      Temp 04/06/16 1103 98.3 F (36.8 C)     Temp Source 04/06/16 1103 Oral     SpO2 04/06/16 1103 99 %     Weight 04/06/16 1103 156 lb (70.8 kg)     Height 04/06/16 1103 5\' 4"  (1.626 m)     Head Circumference --      Peak Flow --      Pain Score 04/06/16 1105 2     Pain Loc --      Pain Edu? --      Excl. in [place]? --    No data found.   Updated Vital Signs BP 122/83 (BP Location: Left Arm)   Pulse 88   Temp 98.3 F (36.8 C) (Oral)   Ht 5\' 4"  (1.626 m)   Wt 156 lb (70.8 kg)   SpO2 99%   BMI 26.78 kg/m   Visual Acuity Right Eye Distance:   Left Eye Distance:   Bilateral Distance:    Right Eye Near:   Left Eye Near:    Bilateral Near:     Physical Exam Nursing notes and Vital Signs reviewed. Appearance:  Patient appears stated age, and in no acute distress Eyes:  Pupils are equal, round, and reactive to light and accomodation.  Extraocular movement is intact.  Conjunctivae are not inflamed    Ears:  Canals normal.  Tympanic membranes normal.  Nose:  Congested turbinates.  No sinus tenderness.   Pharynx:  Normal  Neck:  Supple.  Tender enlarged posterior/lateral nodes are palpated bilaterally  Lungs:  Clear to auscultation.  Breath sounds are equal.  Moving air well. Heart:  Regular rate and rhythm without murmurs, rubs, or gallops.  Abdomen:  Nontender without masses or hepatosplenomegaly.  Bowel sounds are present.  No CVA or flank tenderness.  Extremities:  No edema.  Skin:  No rash present.    UC Treatments / Results  Labs (all labs ordered are listed, but only abnormal results are displayed) Labs Reviewed - No data to display  EKG  EKG Interpretation None       Radiology No results found.  Procedures Procedures (including critical care time)  Medications Ordered in UC Medications - No data to display   Initial Impression / Assessment and Plan / UC Course  I have reviewed the triage vital signs and the nursing notes.  Pertinent labs & imaging results that were available during my care of the patient were reviewed by me and considered in my medical decision making (see chart for details).    There is no evidence of bacterial infection today.   With her history of seasonal rhinitis, will begin prednisone burst/taper. Rx for Robitussin AC for night time cough.  Take plain guaifenesin (1200mg  extended release tabs such as Mucinex) twice daily, with plenty of water, for cough and congestion.  May add Pseudoephedrine (30mg , one or two every 4 to 6 hours) for sinus congestion.  Get adequate rest.   May use Afrin nasal spray (or generic oxymetazoline) each morning for about 5 days and then discontinue.  Also recommend using saline nasal spray several times daily and saline nasal irrigation (AYR is a common brand).  Use Flonase nasal spray each morning after using Afrin nasal spray and saline nasal irrigation. Try warm salt water gargles for sore throat.  Stop all  antihistamines for now, and other non-prescription cough/cold preparations. Begin Azithromycin if not improving about one week or if persistent fever develops (Given a prescription to hold, with an expiration date)  Follow-up with family doctor if not improving about10 days.     Final Clinical Impressions(s) / UC Diagnoses   Final diagnoses:  Viral URI with cough    New Prescriptions New Prescriptions   AZITHROMYCIN (ZITHROMAX Z-PAK) 250 MG TABLET    Take 2 tabs today; then begin one tab once daily for 4 more days. (Rx void after 04/14/16)   GUAIFENESIN-CODEINE 100-10 MG/5ML SYRUP    Take 69mL by mouth at bedtime as needed for cough   PREDNISONE (DELTASONE) 20 MG TABLET    Take one tab by mouth twice daily for 4 days, then one daily for 3 days. Take with food.     Kandra Nicolas, MD 04/06/16 [place], MD 04/06/16 1310

## 2016-04-06 NOTE — Discharge Instructions (Signed)
Take plain guaifenesin (1200mg  extended release tabs such as Mucinex) twice daily, with plenty of water, for cough and congestion.  May add Pseudoephedrine (30mg , one or two every 4 to 6 hours) for sinus congestion.  Get adequate rest.   May use Afrin nasal spray (or generic oxymetazoline) each morning for about 5 days and then discontinue.  Also recommend using saline nasal spray several times daily and saline nasal irrigation (AYR is a common brand).  Use Flonase nasal spray each morning after using Afrin nasal spray and saline nasal irrigation. Try warm salt water gargles for sore throat.  Stop all antihistamines for now, and other non-prescription cough/cold preparations. Begin Azithromycin if not improving about one week or if persistent fever develops (Given a prescription to hold, with an expiration date)  Follow-up with family doctor if not improving about10 days.

## 2016-05-03 ENCOUNTER — Other Ambulatory Visit: Payer: Self-pay | Admitting: Family Medicine

## 2016-05-03 ENCOUNTER — Telehealth: Payer: Self-pay

## 2016-05-03 NOTE — Telephone Encounter (Signed)
[place] for one diflucan.  If not better by Monday needs OV>

## 2016-05-03 NOTE — Telephone Encounter (Signed)
Mannie called and states she has white vaginal discharge and itching for 3 days. She has not tried any OTC for yeast. She would like diflucan sent to the pharmacy. Denies fever, chills, sweats or pelvic pain. Please advise.

## 2016-05-04 MED ORDER — FLUCONAZOLE 150 MG PO TABS: 150.0000 mg | ORAL_TABLET | Freq: Once | ORAL | 0 refills | Status: DC

## 2016-05-04 NOTE — Telephone Encounter (Signed)
Patient advised and medication sent.  

## 2016-05-09 ENCOUNTER — Ambulatory Visit (INDEPENDENT_AMBULATORY_CARE_PROVIDER_SITE_OTHER): Payer: Commercial Managed Care - HMO | Admitting: Family Medicine

## 2016-05-09 ENCOUNTER — Encounter: Payer: Self-pay | Admitting: Family Medicine

## 2016-05-09 VITALS — BP 123/74 | HR 74 | Ht 64.0 in | Wt 163.0 lb

## 2016-05-09 DIAGNOSIS — G43101 Migraine with aura, not intractable, with status migrainosus: Secondary | ICD-10-CM | POA: Diagnosis not present

## 2016-05-09 DIAGNOSIS — Z6827 Body mass index (BMI) 27.0-27.9, adult: Secondary | ICD-10-CM | POA: Diagnosis not present

## 2016-05-09 DIAGNOSIS — J301 Allergic rhinitis due to pollen: Secondary | ICD-10-CM

## 2016-05-09 MED ORDER — SUMATRIPTAN SUCCINATE 100 MG PO TABS: 100.0000 mg | ORAL_TABLET | ORAL | 5 refills | Status: DC | PRN

## 2016-05-09 MED ORDER — KETOROLAC TROMETHAMINE 60 MG/2ML IM SOLN
60.0000 mg | Freq: Once | INTRAMUSCULAR | Status: AC
  Administered 2016-05-09: 60 mg via INTRAMUSCULAR

## 2016-05-09 NOTE — Progress Notes (Signed)
Subjective:    Patient ID: Jamie Mccarthy, female    DOB: May 12, 1975, 41 y.o.   MRN: [id number]  HPI She had a HA for 7 days and then had heart palps on Friday and continued into Sat so she stopped her Saxenda for 2 days but has restarted it. She was feeling nauseated as well.   She didn't try taking her imitrex for her HA.  She had recently quit drinking caffeinated beverages and wonders if that could have triggered it. She has been seeing small specks in her vision on and off.  In regards to her weight loss. She's been doing well on the 2.4 mg of Sexenda. She did miss a few days while she was sick because she stopped it wondering if it could be causing some symptoms. She admits she has not been exercising recently but is hoping to be able to come down on her dose and stay on maybe a lower dose for maintenance.  She also complains of a mild cough with postnasal drip and some mild nasal congestion. She has been taking her size all for allergies. She said her daughter has the same symptoms.   Review of Systems  BP 123/74   Pulse 74   Ht 5\' 4"  (1.626 m)   Wt 163 lb (73.9 kg)   SpO2 100%   BMI 27.98 kg/m     Allergies  Allergen Reactions  . Latex Itching    Pt states she gets severely itchy with latex  . Naproxen Rash    Past Medical History:  Diagnosis Date  . Anemia    Has sickle cell trait  . GERD (gastroesophageal reflux disease)    diet controlled  . Gestational diabetes   . H/O vaginal delivery 2010  . Headache(784.0)    Migraines  . Herpes 2007   hx of  . Hypertension    no current medications  . Iron deficiency anemia, unspecified 10/20/2012  . PONV (postoperative nausea and vomiting)    has had PONV with previous c/s and also lap band surgery 2004  . Seasonal allergies     Past Surgical History:  Procedure Laterality Date  . BILATERAL SALPINGECTOMY  07/16/2012   Procedure: BILATERAL DISTAL SALPINGECTOMY;  Surgeon: Logan Bores, MD;  Location: [facility];   Service: Obstetrics;;  . CESAREAN SECTION  2012  . CESAREAN SECTION N/A 07/16/2012   Procedure: REPEAT CESAREAN SECTION;  Surgeon: Logan Bores, MD;  Location: [facility];  Service: Obstetrics;  Laterality: N/A;  . CHOLECYSTECTOMY N/A 11/14/2012   Procedure: LAPAROSCOPIC CHOLECYSTECTOMY;  Surgeon: Gayland Curry, MD;  Location: [facility];  Service: General;  Laterality: N/A;  . GASTRIC BANDING PORT REVISION N/A 11/14/2012   Procedure: GASTRIC BANDING PORT REVISION;  Surgeon: Gayland Curry, MD;  Location: [facility];  Service: General;  Laterality: N/A;  PORT PLACEMENT MOVED NEW MESH INSERTED   . LAPAROSCOPIC GASTRIC BANDING  2005  . LAPAROSCOPIC VAGINAL HYSTERECTOMY WITH SALPINGECTOMY Bilateral 10/12/2015   Procedure: HYSTERECTOMY TOTAL LAPAROSCOPIC BILATERAL SALPINGECTOMY;  Surgeon: Sherlyn Hay, DO;  Location: [facility];  Service: Gynecology;  Laterality: Bilateral;  . reposition gastric band  2006   x2  . TUBAL LIGATION  2014  . [place] EXTRACTION  1999    Social History   Social History  . Marital status: Married    Spouse name: N/A  . Number of children: 1  . Years of education: N/A   Occupational History  . [employer]   Social History Main Topics  . Smoking status: Never Smoker  . Smokeless tobacco: Never Used  . Alcohol use 0.0 oz/week     Comment: occ  . Drug use: No  . Sexual activity: Yes     Comment: separated, 2 caffeine drinks daily.    Other Topics Concern  . Not on file   Social History Narrative   2 caffeine drinks per day    Family History  Problem Relation Age of Onset  . Hypertension Mother   . Diabetes      father's family  . Thyroid cancer    . Breast cancer Maternal Grandmother     Outpatient Encounter Prescriptions as of 05/09/2016  Medication Sig  . Insulin Pen Needle 31G X 4 MM MISC 1 each by Does not apply route daily. Use to inject into skin daily as directed  . levocetirizine (XYZAL) 5 MG tablet   . Liraglutide  -Weight Management (SAXENDA) 18 MG/3ML SOPN Inject 2.4 mg into the skin daily.  Marland Kitchen nystatin-triamcinolone (MYCOLOG II) cream   . terconazole (TERAZOL 7) 0.4 % vaginal cream   . SUMAtriptan (IMITREX) 100 MG tablet Take 1 tablet (100 mg total) by mouth every 2 (two) hours as needed for migraine. May repeat in 2 hours if headache persists or recurs.  . [DISCONTINUED] azithromycin (ZITHROMAX Z-PAK) 250 MG tablet Take 2 tabs today; then begin one tab once daily for 4 more days. (Rx void after 04/14/16)  . [DISCONTINUED] guaiFENesin-codeine 100-10 MG/5ML syrup Take 25mL by mouth at bedtime as needed for cough  . [DISCONTINUED] predniSONE (DELTASONE) 20 MG tablet Take one tab by mouth twice daily for 4 days, then one daily for 3 days. Take with food.  . [EXPIRED] ketorolac (TORADOL) injection 60 mg    No facility-administered encounter medications on file as of 05/09/2016.          Objective:   Physical Exam  Constitutional: She is oriented to person, place, and time. She appears well-developed and well-nourished.  HENT:  Head: Normocephalic and atraumatic.  Right Ear: External ear normal.  Left Ear: External ear normal.  Nose: Nose normal.  Mouth/Throat: Oropharynx is clear and moist.  TMs and canals are clear.   Eyes: Conjunctivae and EOM are normal. Pupils are equal, round, and reactive to light.  Neck: Neck supple. No thyromegaly present.  Cardiovascular: Normal rate, regular rhythm and normal heart sounds.   Pulmonary/Chest: Effort normal and breath sounds normal. She has no wheezes.  Lymphadenopathy:    She has no cervical adenopathy.  Neurological: She is alert and oriented to person, place, and time.  Skin: Skin is warm and dry.  Psychiatric: She has a normal mood and affect.          Assessment & Plan:  Acute status migrainous - Will treat with Toradol injection here in the office to try to break the headache cycle. We'll also refill her Imitrex so that she can use that as  well. Make sure resting and hydrating well. I suspect that the recent one withdrawal of caffeine in addition to recent widely varying changes in the weather as well as high: Counts are likely exacerbating her symptoms.  Allergic rhinitis-suspect her cough and postnasal drip related to allergies. She could certainly consider adding a nasal steroid spray such as Flonase or Nasonex.  Abnormal weight gain-doing well with Sexenda. I think at this point we can try decreasing her back down to 1.8 mg for maintenance. We did discuss  getting back on track with exercise so that she doesn't gain weight on the decreased dose.

## 2016-05-15 ENCOUNTER — Other Ambulatory Visit: Payer: Self-pay | Admitting: Family Medicine

## 2016-06-05 ENCOUNTER — Ambulatory Visit: Payer: Commercial Managed Care - HMO | Admitting: Family Medicine

## 2016-06-26 ENCOUNTER — Ambulatory Visit (INDEPENDENT_AMBULATORY_CARE_PROVIDER_SITE_OTHER): Payer: Commercial Managed Care - HMO | Admitting: Family Medicine

## 2016-06-26 ENCOUNTER — Encounter: Payer: Self-pay | Admitting: Family Medicine

## 2016-06-26 VITALS — BP 138/94 | HR 75 | Ht 64.67 in | Wt 164.0 lb

## 2016-06-26 DIAGNOSIS — D508 Other iron deficiency anemias: Secondary | ICD-10-CM | POA: Diagnosis not present

## 2016-06-26 DIAGNOSIS — M7989 Other specified soft tissue disorders: Secondary | ICD-10-CM

## 2016-06-26 MED ORDER — LIRAGLUTIDE -WEIGHT MANAGEMENT 18 MG/3ML ~~LOC~~ SOPN: 3.0000 mg | PEN_INJECTOR | Freq: Every day | SUBCUTANEOUS | 1 refills | Status: DC

## 2016-06-26 NOTE — Progress Notes (Signed)
Subjective:    Patient ID: Jamie Mccarthy, female    DOB: 10/13/1975, 41 y.o.   MRN: [id number]  HPI  pt reports that on saturday morning she woke up and her hands and R arm and feet were swollen her BP was elevated 150/100 she said that she feels like her body is full of fluid. she has increased her saxenda to 3mg  about 2 weeks ago.  She felt her speech on saturday was slurred. She did go to work and said that even her coworkers noticed that she wasn't really quite acting like herself. She had eaten some oysters the night before.  She reports that she eats oysters at least a couple times a month so this was not out of the norm for her. The next day when she woke up she actually was feeling a little better. The swelling seemed to have resolved but she still just, felt tired and like she was retaining some fluid.  Review of Systems  No fevers chills or sweats. No upper respiratory symptoms. No new GI symptoms.  BP (!) 138/94   Pulse 75   Ht 5' 4.67" (1.642 m)   Wt 164 lb (74.4 kg)   SpO2 100%   BMI 27.57 kg/m     Allergies  Allergen Reactions  . Latex Itching    Pt states she gets severely itchy with latex  . Naproxen Rash    Past Medical History:  Diagnosis Date  . Anemia    Has sickle cell trait  . GERD (gastroesophageal reflux disease)    diet controlled  . Gestational diabetes   . H/O vaginal delivery 2010  . Headache(784.0)    Migraines  . Herpes 2007   hx of  . Hypertension    no current medications  . Iron deficiency anemia, unspecified 10/20/2012  . PONV (postoperative nausea and vomiting)    has had PONV with previous c/s and also lap band surgery 2004  . Seasonal allergies     Past Surgical History:  Procedure Laterality Date  . BILATERAL SALPINGECTOMY  07/16/2012   Procedure: BILATERAL DISTAL SALPINGECTOMY;  Surgeon: Logan Bores, MD;  Location: [facility];  Service: Obstetrics;;  . CESAREAN SECTION  2012  . CESAREAN SECTION N/A 07/16/2012   Procedure:  REPEAT CESAREAN SECTION;  Surgeon: Logan Bores, MD;  Location: [facility];  Service: Obstetrics;  Laterality: N/A;  . CHOLECYSTECTOMY N/A 11/14/2012   Procedure: LAPAROSCOPIC CHOLECYSTECTOMY;  Surgeon: Gayland Curry, MD;  Location: [facility];  Service: General;  Laterality: N/A;  . GASTRIC BANDING PORT REVISION N/A 11/14/2012   Procedure: GASTRIC BANDING PORT REVISION;  Surgeon: Gayland Curry, MD;  Location: [facility];  Service: General;  Laterality: N/A;  PORT PLACEMENT MOVED NEW MESH INSERTED   . LAPAROSCOPIC GASTRIC BANDING  2005  . LAPAROSCOPIC VAGINAL HYSTERECTOMY WITH SALPINGECTOMY Bilateral 10/12/2015   Procedure: HYSTERECTOMY TOTAL LAPAROSCOPIC BILATERAL SALPINGECTOMY;  Surgeon: Sherlyn Hay, DO;  Location: [facility];  Service: Gynecology;  Laterality: Bilateral;  . reposition gastric band  2006   x2  . TUBAL LIGATION  2014  . [place] EXTRACTION  1999    Social History   Social History  . Marital status: Married    Spouse name: N/A  . Number of children: 1  . Years of education: N/A   Occupational History  . [employer]   Social History Main Topics  . Smoking status: Never Smoker  . Smokeless tobacco: Never Used  .  Alcohol use 0.0 oz/week     Comment: occ  . Drug use: No  . Sexual activity: Yes     Comment: separated, 2 caffeine drinks daily.    Other Topics Concern  . Not on file   Social History Narrative   2 caffeine drinks per day    Family History  Problem Relation Age of Onset  . Hypertension Mother   . Diabetes Unknown        father's family  . Thyroid cancer Unknown   . Breast cancer Maternal Grandmother     Outpatient Encounter Prescriptions as of 06/26/2016  Medication Sig  . Insulin Pen Needle 31G X 4 MM MISC 1 each by Does not apply route daily. Use to inject into skin daily as directed  . levocetirizine (XYZAL) 5 MG tablet TAKE 1 TABLET (5 MG TOTAL) BY MOUTH EVERY EVENING.  . Liraglutide -Weight Management (SAXENDA) 18  MG/3ML SOPN Inject 3 mg into the skin daily.  Marland Kitchen nystatin-triamcinolone (MYCOLOG II) cream   . SUMAtriptan (IMITREX) 100 MG tablet Take 1 tablet (100 mg total) by mouth every 2 (two) hours as needed for migraine. May repeat in 2 hours if headache persists or recurs.  Marland Kitchen terconazole (TERAZOL 7) 0.4 % vaginal cream   . [DISCONTINUED] Liraglutide -Weight Management (SAXENDA) 18 MG/3ML SOPN Inject 2.4 mg into the skin daily.   No facility-administered encounter medications on file as of 06/26/2016.           Objective:   Physical Exam  Constitutional: She is oriented to person, place, and time. She appears well-developed and well-nourished.  HENT:  Head: Normocephalic and atraumatic.  Right Ear: External ear normal.  Left Ear: External ear normal.  Nose: Nose normal.  Mouth/Throat: Oropharynx is clear and moist.  TMs and canals are clear.   Eyes: Conjunctivae and EOM are normal. Pupils are equal, round, and reactive to light.  Neck: Neck supple. No thyromegaly present.  Cardiovascular: Normal rate, regular rhythm and normal heart sounds.   Pulmonary/Chest: Effort normal and breath sounds normal. She has no wheezes.  Abdominal: Soft. Bowel sounds are normal. She exhibits no distension and no mass. There is no tenderness. There is no rebound and no guarding.  Musculoskeletal: She exhibits no edema.  No evident edema of hands ankles or feet today.  Lymphadenopathy:    She has no cervical adenopathy.  Neurological: She is alert and oriented to person, place, and time.  Skin: Skin is warm and dry.  Psychiatric: She has a normal mood and affect.          Assessment & Plan:  Swelling of the extremities-seems to have resolved on its own. Suspect it was from increased salt intake probably not a reaction to the oysters.We discussed really watching a low salt diet and drinking plenty of fluid and getting regular exercise. Blood pressure on the diastolic was mildly elevated today so do want to  recheck this in a couple weeks and make sure that it corrects. Certainly we could consider diuretic for a short period of time but did not offer that today. She did alter also try going back down on her dose of Sexenda to see if this makes a difference.  Slurred speech-this is still unclear to me. I don't think that she had a stroke. She was still able to work and function. She says really it was more of a slowness of the speech versus actual slurring of her words. Certainly if this occurs again and  please let me know.  Iron def anemia - she was also diagnosed with iron deficiency anemia several months ago but says she never actually started the iron supplementation. I let her know that I would send over a prescription iron tab and that she could get started on that and we can recheck her lab work in 2-3 months.

## 2016-06-27 MED ORDER — FUSION PLUS PO CAPS: 1.0000 | ORAL_CAPSULE | Freq: Every day | ORAL | 3 refills | Status: DC

## 2016-07-11 ENCOUNTER — Telehealth: Payer: Self-pay | Admitting: Physician Assistant

## 2016-07-11 NOTE — Telephone Encounter (Signed)
Pt advised. She will schedule an appt.

## 2016-07-11 NOTE — Telephone Encounter (Signed)
Pt was on desonide (DESONATE) 0.05 % gel in the past for acne. Would like to restart Rx. Routing to Provider in office for review.

## 2016-07-11 NOTE — Telephone Encounter (Signed)
Desonide is not usually a treatment for acne. In fact topical steroids can make acne worse. Topical steroids are used for inflammatory conditions such as eczema, psoriasis, seb dermatitis. I think it is best to come see PCP to see what your needs are and now best to treat it.

## 2016-07-19 DIAGNOSIS — Z01419 Encounter for gynecological examination (general) (routine) without abnormal findings: Secondary | ICD-10-CM | POA: Diagnosis not present

## 2016-07-23 ENCOUNTER — Ambulatory Visit: Payer: Commercial Managed Care - HMO | Admitting: Family Medicine

## 2016-07-23 ENCOUNTER — Ambulatory Visit (INDEPENDENT_AMBULATORY_CARE_PROVIDER_SITE_OTHER): Payer: Commercial Managed Care - HMO | Admitting: Family Medicine

## 2016-07-23 ENCOUNTER — Encounter: Payer: Self-pay | Admitting: Family Medicine

## 2016-07-23 VITALS — BP 138/86 | HR 69 | Resp 16 | Wt 164.1 lb

## 2016-07-23 DIAGNOSIS — H578 Other specified disorders of eye and adnexa: Secondary | ICD-10-CM

## 2016-07-23 DIAGNOSIS — H5789 Other specified disorders of eye and adnexa: Secondary | ICD-10-CM

## 2016-07-23 DIAGNOSIS — L700 Acne vulgaris: Secondary | ICD-10-CM

## 2016-07-23 MED ORDER — TRETINOIN MICROSPHERE 0.06 % EX GEL: 1.0000 "application " | Freq: Every day | CUTANEOUS | 1 refills | Status: DC

## 2016-07-23 NOTE — Progress Notes (Signed)
Subjective:    Patient ID: Jamie Mccarthy, female    DOB: November 14, 1975, 41 y.o.   MRN: [id number]  HPI 41 year old female with a history of hypertension comes in today with left eye redness for approximately 1 week. She says it's really not painful or itchy or bothersome. That is the one eye that she actually wears a contact in. She has not tried removing the contacts for next and appear to time. She did clean them really well. She has not had any discharge from the eye. No fevers chills or sweats. No other upper respiratory symptoms. No swelling of the lid. She's not using any over-the-counter drops or ointments.  She's also had acne particularly on her chin and facial cheeks over the last month. She says this is very unusual for her. She's not sure what may have triggered it. She denies any new use of face creams products etc. She wants to know if there something that she could try that would be helpful besides the over-the-counter benzyl peroxide which is not helping.  Review of Systems  BP 138/86   Pulse 69   Resp 16   Wt 164 lb 1.6 oz (74.4 kg)   BMI 27.59 kg/m     Allergies  Allergen Reactions  . Latex Itching    Pt states she gets severely itchy with latex  . Naproxen Rash    Past Medical History:  Diagnosis Date  . Anemia    Has sickle cell trait  . GERD (gastroesophageal reflux disease)    diet controlled  . Gestational diabetes   . H/O vaginal delivery 2010  . Headache(784.0)    Migraines  . Herpes 2007   hx of  . Hypertension    no current medications  . Iron deficiency anemia, unspecified 10/20/2012  . PONV (postoperative nausea and vomiting)    has had PONV with previous c/s and also lap band surgery 2004  . Seasonal allergies     Past Surgical History:  Procedure Laterality Date  . BILATERAL SALPINGECTOMY  07/16/2012   Procedure: BILATERAL DISTAL SALPINGECTOMY;  Surgeon: Logan Bores, MD;  Location: [facility];  Service: Obstetrics;;  . CESAREAN SECTION   2012  . CESAREAN SECTION N/A 07/16/2012   Procedure: REPEAT CESAREAN SECTION;  Surgeon: Logan Bores, MD;  Location: [facility];  Service: Obstetrics;  Laterality: N/A;  . CHOLECYSTECTOMY N/A 11/14/2012   Procedure: LAPAROSCOPIC CHOLECYSTECTOMY;  Surgeon: Gayland Curry, MD;  Location: [facility];  Service: General;  Laterality: N/A;  . GASTRIC BANDING PORT REVISION N/A 11/14/2012   Procedure: GASTRIC BANDING PORT REVISION;  Surgeon: Gayland Curry, MD;  Location: [facility];  Service: General;  Laterality: N/A;  PORT PLACEMENT MOVED NEW MESH INSERTED   . LAPAROSCOPIC GASTRIC BANDING  2005  . LAPAROSCOPIC VAGINAL HYSTERECTOMY WITH SALPINGECTOMY Bilateral 10/12/2015   Procedure: HYSTERECTOMY TOTAL LAPAROSCOPIC BILATERAL SALPINGECTOMY;  Surgeon: Sherlyn Hay, DO;  Location: [facility];  Service: Gynecology;  Laterality: Bilateral;  . reposition gastric band  2006   x2  . TUBAL LIGATION  2014  . [place] EXTRACTION  1999    Social History   Social History  . Marital status: Married    Spouse name: N/A  . Number of children: 1  . Years of education: N/A   Occupational History  . [employer]   Social History Main Topics  . Smoking status: Never Smoker  . Smokeless tobacco: Never Used  . Alcohol use 0.0 oz/week  Comment: occ  . Drug use: No  . Sexual activity: Yes     Comment: separated, 2 caffeine drinks daily.    Other Topics Concern  . Not on file   Social History Narrative   2 caffeine drinks per day    Family History  Problem Relation Age of Onset  . Hypertension Mother   . Diabetes Unknown        father's family  . Thyroid cancer Unknown   . Breast cancer Maternal Grandmother     Outpatient Encounter Prescriptions as of 07/23/2016  Medication Sig  . Insulin Pen Needle 31G X 4 MM MISC 1 each by Does not apply route daily. Use to inject into skin daily as directed  . Iron-FA-B Cmp-C-Biot-Probiotic (FUSION PLUS) CAPS Take 1 capsule by mouth daily.   Marland Kitchen levocetirizine (XYZAL) 5 MG tablet TAKE 1 TABLET (5 MG TOTAL) BY MOUTH EVERY EVENING.  . Liraglutide -Weight Management (SAXENDA) 18 MG/3ML SOPN Inject 3 mg into the skin daily.  . SUMAtriptan (IMITREX) 100 MG tablet Take 1 tablet (100 mg total) by mouth every 2 (two) hours as needed for migraine. May repeat in 2 hours if headache persists or recurs.  . Tretinoin Microsphere (RETIN-A MICRO PUMP) 0.06 % GEL Apply 1 application topically at bedtime.  . [DISCONTINUED] nystatin-triamcinolone (MYCOLOG II) cream   . [DISCONTINUED] terconazole (TERAZOL 7) 0.4 % vaginal cream    No facility-administered encounter medications on file as of 07/23/2016.          Objective:   Physical Exam  Constitutional: She is oriented to person, place, and time. She appears well-developed and well-nourished.  HENT:  Head: Normocephalic and atraumatic.  Right Ear: External ear normal.  Left Ear: External ear normal.  Nose: Nose normal.  Mouth/Throat: Oropharynx is clear and moist.  Eyes: Conjunctivae and EOM are normal. Pupils are equal, round, and reactive to light.  Some conjunctival injection on the sclera on the left eye. No lid edema. No discharge or drainage in the corners.  Cardiovascular: Normal rate.   Pulmonary/Chest: Effort normal.  Neurological: She is alert and oriented to person, place, and time.  Skin: Skin is dry. No pallor.  Psychiatric: She has a normal mood and affect. Her behavior is normal.  Vitals reviewed.       Assessment & Plan:  Red eye, left-does not look consistent with infection such as conjunctivitis. It does not look like it's allergic as she really is not having any discomfort itching or irritation at all. At this point I would like her to just leave her contact out for at least 3-4 days and see if it improves. If not then please let me know. If it suddenly getting worse then let me know or consider contacting her eye doctor as well.  Acne -  Wash skin with a benzoyl  peroxide wash and then apply the retinoid treatment at bedtime. If this is to try and decrease to every other day.

## 2016-07-23 NOTE — Patient Instructions (Addendum)
Wash skin with a benzoyl peroxide wash and then apply the retinoid treatment at bedtime. If this is to try and decrease to every other day.

## 2016-07-27 ENCOUNTER — Telehealth: Payer: Self-pay | Admitting: *Deleted

## 2016-07-27 DIAGNOSIS — H1032 Unspecified acute conjunctivitis, left eye: Secondary | ICD-10-CM | POA: Diagnosis not present

## 2016-07-27 MED ORDER — ERYTHROMYCIN 5 MG/GM OP OINT: 1.0000 "application " | TOPICAL_OINTMENT | Freq: Four times a day (QID) | OPHTHALMIC | 0 refills | Status: DC

## 2016-07-27 NOTE — Telephone Encounter (Signed)
Rx sent to [facility]

## 2016-07-27 NOTE — Telephone Encounter (Signed)
Pt left vm stating that she was told to let you know if the drainage in her left eye got worse and it has.  Please advise.

## 2016-07-27 NOTE — Telephone Encounter (Signed)
Pt called and lvm asking that the eye drops be called into the pharmacy for her.Jamie Mccarthy

## 2016-08-02 ENCOUNTER — Ambulatory Visit: Payer: Commercial Managed Care - HMO | Admitting: Family Medicine

## 2016-08-03 DIAGNOSIS — H1045 Other chronic allergic conjunctivitis: Secondary | ICD-10-CM | POA: Diagnosis not present

## 2016-09-17 ENCOUNTER — Other Ambulatory Visit: Payer: Self-pay | Admitting: Family Medicine

## 2016-09-26 ENCOUNTER — Ambulatory Visit (INDEPENDENT_AMBULATORY_CARE_PROVIDER_SITE_OTHER): Payer: 59 | Admitting: Family Medicine

## 2016-09-26 ENCOUNTER — Encounter: Payer: Self-pay | Admitting: Family Medicine

## 2016-09-26 VITALS — BP 137/86 | HR 78 | Wt 165.0 lb

## 2016-09-26 DIAGNOSIS — Z1331 Encounter for screening for depression: Secondary | ICD-10-CM

## 2016-09-26 DIAGNOSIS — Z23 Encounter for immunization: Secondary | ICD-10-CM | POA: Diagnosis not present

## 2016-09-26 DIAGNOSIS — Z6827 Body mass index (BMI) 27.0-27.9, adult: Secondary | ICD-10-CM | POA: Diagnosis not present

## 2016-09-26 DIAGNOSIS — G43109 Migraine with aura, not intractable, without status migrainosus: Secondary | ICD-10-CM | POA: Diagnosis not present

## 2016-09-26 DIAGNOSIS — R790 Abnormal level of blood mineral: Secondary | ICD-10-CM | POA: Diagnosis not present

## 2016-09-26 DIAGNOSIS — Z1389 Encounter for screening for other disorder: Secondary | ICD-10-CM | POA: Diagnosis not present

## 2016-09-26 MED ORDER — SUMATRIPTAN SUCCINATE 6 MG/0.5ML ~~LOC~~ SOSY: 6.0000 mg | PREFILLED_SYRINGE | SUBCUTANEOUS | 3 refills | Status: DC | PRN

## 2016-09-26 NOTE — Progress Notes (Signed)
Subjective:    Patient ID: Jamie Mccarthy, female    DOB: November 22, 1975, 41 y.o.   MRN: [id number]  HPI Here today for f/u iron deficiency anemia.  She has been on iron supplement for about 3 months at this point in time.  S/P gastric banding. She initially started taking iron she was doing well but more recently she's had difficulty with the iron. Says when takes it in the morning just feels very "full". Denies feling like it is getting stuck. Still feeling tired and fatigued .    BMI 27 - She hasn't been exercising.  Says she has done well on the Saxenda. Would like to get to 157 lbs. Says she got down to that at one time and felt great.   Migraine HA - she would like to consider switching to the injectable form of Imitrex. Sometimes she gets very nauseated with her migraines and has a hard time keeping the Imitrex down.   Review of Systems  BP 137/86   Pulse 78   Wt 165 lb (74.8 kg)   SpO2 100%   BMI 27.74 kg/m     Allergies  Allergen Reactions  . Latex Itching    Pt states she gets severely itchy with latex  . Naproxen Rash    Past Medical History:  Diagnosis Date  . Anemia    Has sickle cell trait  . GERD (gastroesophageal reflux disease)    diet controlled  . Gestational diabetes   . H/O vaginal delivery 2010  . Headache(784.0)    Migraines  . Herpes 2007   hx of  . Hypertension    no current medications  . Iron deficiency anemia, unspecified 10/20/2012  . PONV (postoperative nausea and vomiting)    has had PONV with previous c/s and also lap band surgery 2004  . Seasonal allergies     Past Surgical History:  Procedure Laterality Date  . BILATERAL SALPINGECTOMY  07/16/2012   Procedure: BILATERAL DISTAL SALPINGECTOMY;  Surgeon: Logan Bores, MD;  Location: [facility];  Service: Obstetrics;;  . CESAREAN SECTION  2012  . CESAREAN SECTION N/A 07/16/2012   Procedure: REPEAT CESAREAN SECTION;  Surgeon: Logan Bores, MD;  Location: [facility];  Service:  Obstetrics;  Laterality: N/A;  . CHOLECYSTECTOMY N/A 11/14/2012   Procedure: LAPAROSCOPIC CHOLECYSTECTOMY;  Surgeon: Gayland Curry, MD;  Location: [facility];  Service: General;  Laterality: N/A;  . GASTRIC BANDING PORT REVISION N/A 11/14/2012   Procedure: GASTRIC BANDING PORT REVISION;  Surgeon: Gayland Curry, MD;  Location: [facility];  Service: General;  Laterality: N/A;  PORT PLACEMENT MOVED NEW MESH INSERTED   . LAPAROSCOPIC GASTRIC BANDING  2005  . LAPAROSCOPIC VAGINAL HYSTERECTOMY WITH SALPINGECTOMY Bilateral 10/12/2015   Procedure: HYSTERECTOMY TOTAL LAPAROSCOPIC BILATERAL SALPINGECTOMY;  Surgeon: Sherlyn Hay, DO;  Location: [facility];  Service: Gynecology;  Laterality: Bilateral;  . reposition gastric band  2006   x2  . TUBAL LIGATION  2014  . [place] EXTRACTION  1999    Social History   Social History  . Marital status: Married    Spouse name: N/A  . Number of children: 1  . Years of education: N/A   Occupational History  . SALES [employer]   Social History Main Topics  . Smoking status: Never Smoker  . Smokeless tobacco: Never Used  . Alcohol use 0.0 oz/week     Comment: occ  . Drug use: No  . Sexual activity: Yes  Comment: separated, 2 caffeine drinks daily.    Other Topics Concern  . Not on file   Social History Narrative   2 caffeine drinks per day    Family History  Problem Relation Age of Onset  . Hypertension Mother   . Diabetes Unknown        father's family  . Thyroid cancer Unknown   . Breast cancer Maternal Grandmother     Outpatient Encounter Prescriptions as of 09/26/2016  Medication Sig  . B-D UF III MINI PEN NEEDLES 31G X 5 MM MISC   . Iron-FA-B Cmp-C-Biot-Probiotic (FUSION PLUS) CAPS Take 1 capsule by mouth daily.  Marland Kitchen levocetirizine (XYZAL) 5 MG tablet TAKE 1 TABLET (5 MG TOTAL) BY MOUTH EVERY EVENING.  Marland Kitchen SAXENDA 18 MG/3ML SOPN INJECT 3 MG INTO THE SKIN DAILY.  . Tretinoin Microsphere (RETIN-A MICRO PUMP) 0.06 % GEL Apply  1 application topically at bedtime.  . [DISCONTINUED] SUMAtriptan (IMITREX) 100 MG tablet Take 1 tablet (100 mg total) by mouth every 2 (two) hours as needed for migraine. May repeat in 2 hours if headache persists or recurs.  . SUMAtriptan (IMITREX) 6 MG/0.5ML SOSY injection Inject 0.5 mLs (6 mg total) into the skin every 2 (two) hours as needed for migraine or headache. F  . [DISCONTINUED] erythromycin ophthalmic ointment Place 1 application into the left eye 4 (four) times daily. X 7 daus/  . [DISCONTINUED] Insulin Pen Needle 31G X 4 MM MISC 1 each by Does not apply route daily. Use to inject into skin daily as directed   No facility-administered encounter medications on file as of 09/26/2016.          Objective:   Physical Exam  Constitutional: She is oriented to person, place, and time. She appears well-developed and well-nourished.  HENT:  Head: Normocephalic and atraumatic.  Eyes: Conjunctivae and EOM are normal.  Cardiovascular: Normal rate.   Pulmonary/Chest: Effort normal.  Neurological: She is alert and oriented to person, place, and time.  Skin: Skin is dry. No pallor.  Psychiatric: She has a normal mood and affect. Her behavior is normal.  Vitals reviewed.         Assessment & Plan:  Iron def anemia - since only on med x 1 month then will try taking med later in the day and then check in one month. Lab slip given today.   Positive depression screen - Discussed options.  She feels like she is in a "funk". And just wants to keep an "eye on it".  Not wanting any formal tx. Discussed exercise can improve mood.   Migraine with aura - will change to imitrex injection since gets N/V.   BMI 27 - continue saxenda. Work on [employer] exercise.

## 2016-11-08 ENCOUNTER — Other Ambulatory Visit: Payer: Self-pay | Admitting: Family Medicine

## 2017-01-07 ENCOUNTER — Other Ambulatory Visit: Payer: Self-pay | Admitting: Family Medicine

## 2017-01-18 ENCOUNTER — Ambulatory Visit: Payer: 59

## 2017-01-18 NOTE — Progress Notes (Deleted)
   Subjective:    Patient ID: Jamie Mccarthy, female    DOB: [birth year], 41 y.o.   MRN: [id number]  HPI 3 mo f/u for abnormal weight gain - Currently on Saxenda and doing well.   F/U iron def anemia -    Review of Systems     Objective:   Physical Exam  Constitutional: She is oriented to person, place, and time. She appears well-developed and well-nourished.  HENT:  Head: Normocephalic and atraumatic.  Cardiovascular: Normal rate, regular rhythm and normal heart sounds.  Pulmonary/Chest: Effort normal and breath sounds normal.  Neurological: She is alert and oriented to person, place, and time.  Skin: Skin is warm and dry.  Psychiatric: She has a normal mood and affect. Her behavior is normal.          Assessment & Plan:  Abnormal weight gain /BMI -   Iron def anemia - due for recheck on iron-

## 2017-02-05 ENCOUNTER — Encounter: Payer: Self-pay | Admitting: Family Medicine

## 2017-02-05 ENCOUNTER — Ambulatory Visit: Payer: 59 | Admitting: Family Medicine

## 2017-02-05 VITALS — BP 136/83 | HR 75 | Ht 65.0 in | Wt 167.0 lb

## 2017-02-05 DIAGNOSIS — H8112 Benign paroxysmal vertigo, left ear: Secondary | ICD-10-CM

## 2017-02-05 DIAGNOSIS — R42 Dizziness and giddiness: Secondary | ICD-10-CM

## 2017-02-05 DIAGNOSIS — R002 Palpitations: Secondary | ICD-10-CM

## 2017-02-05 MED ORDER — LIRAGLUTIDE -WEIGHT MANAGEMENT 18 MG/3ML ~~LOC~~ SOPN: 3.0000 mg | PEN_INJECTOR | Freq: Every day | SUBCUTANEOUS | 1 refills | Status: DC

## 2017-02-05 MED ORDER — FUSION PLUS PO CAPS: 1.0000 | ORAL_CAPSULE | Freq: Every day | ORAL | 3 refills | Status: DC

## 2017-02-05 NOTE — Patient Instructions (Addendum)

## 2017-02-05 NOTE — Progress Notes (Signed)
Subjective:    Patient ID: Jamie Mccarthy, female    DOB: [birth year], 42 y.o.   MRN: [id number]  HPI 42 year old female comes in today complaining of dizziness.  She says last Friday, approximately 5 days ago she woke up feeling like things were spinning.  She started to get a headache.  She still managed to go to work but then eventually developed a migraine and had to go home early.  She started feeling a little bit more anxious about it and developed a little chest pain and some palpitations.  Right now she does not feel it but it has been coming and going.  She has not had any recent cold symptoms.  No ear pain or pressure.  She did have some nausea and vomited the day of the migraine.  No ear pain.  She says typically she gets an aura with her migraines at this time did not.   Abnormal weight gain - s/p bariatric surgery.  Is doing well on Sexenda and has been able to maintain her current weight.  She would like to continue the medication if at all possible.   Review of Systems  BP 136/83   Pulse 75   Ht 5\' 5"  (1.651 m)   Wt 167 lb (75.8 kg)   BMI 27.79 kg/m     Allergies  Allergen Reactions  . Latex Itching    Pt states she gets severely itchy with latex  . Naproxen Rash    Past Medical History:  Diagnosis Date  . Anemia    Has sickle cell trait  . GERD (gastroesophageal reflux disease)    diet controlled  . Gestational diabetes   . H/O vaginal delivery 2010  . Headache(784.0)    Migraines  . Herpes 2007   hx of  . Hypertension    no current medications  . Iron deficiency anemia, unspecified 10/20/2012  . PONV (postoperative nausea and vomiting)    has had PONV with previous c/s and also lap band surgery 2004  . Seasonal allergies     Past Surgical History:  Procedure Laterality Date  . BILATERAL SALPINGECTOMY  07/16/2012   Procedure: BILATERAL DISTAL SALPINGECTOMY;  Surgeon: Logan Bores, MD;  Location: [facility];  Service: Obstetrics;;  . CESAREAN SECTION   2012  . CESAREAN SECTION N/A 07/16/2012   Procedure: REPEAT CESAREAN SECTION;  Surgeon: Logan Bores, MD;  Location: [facility];  Service: Obstetrics;  Laterality: N/A;  . CHOLECYSTECTOMY N/A 11/14/2012   Procedure: LAPAROSCOPIC CHOLECYSTECTOMY;  Surgeon: Gayland Curry, MD;  Location: [facility];  Service: General;  Laterality: N/A;  . GASTRIC BANDING PORT REVISION N/A 11/14/2012   Procedure: GASTRIC BANDING PORT REVISION;  Surgeon: Gayland Curry, MD;  Location: [facility];  Service: General;  Laterality: N/A;  PORT PLACEMENT MOVED NEW MESH INSERTED   . LAPAROSCOPIC GASTRIC BANDING  2005  . LAPAROSCOPIC VAGINAL HYSTERECTOMY WITH SALPINGECTOMY Bilateral 10/12/2015   Procedure: HYSTERECTOMY TOTAL LAPAROSCOPIC BILATERAL SALPINGECTOMY;  Surgeon: Sherlyn Hay, DO;  Location: [facility];  Service: Gynecology;  Laterality: Bilateral;  . reposition gastric band  2006   x2  . TUBAL LIGATION  2014  . WISDOM TOOTH EXTRACTION  1999    Social History   Socioeconomic History  . Marital status: Married    Spouse name: Not on file  . Number of children: 1  . Years of education: Not on file  . Highest education level: Not on file  Social Needs  . Financial  resource strain: Not on file  . Food insecurity - worry: Not on file  . Food insecurity - inability: Not on file  . Transportation needs - medical: Not on file  . Transportation needs - non-medical: Not on file  Occupational History  . Occupation: Theatre manager: [employer]  Tobacco Use  . Smoking status: Never Smoker  . Smokeless tobacco: Never Used  Substance and Sexual Activity  . Alcohol use: Yes    Alcohol/week: 0.0 oz    Comment: occ  . Drug use: No  . Sexual activity: Yes    Comment: separated, 2 caffeine drinks daily.   Other Topics Concern  . Not on file  Social History Narrative   2 caffeine drinks per day    Family History  Problem Relation Age of Onset  . Hypertension Mother   . Diabetes Unknown         father's family  . Thyroid cancer Unknown   . Breast cancer Maternal Grandmother     Outpatient Encounter Medications as of 02/05/2017  Medication Sig  . B-D UF III MINI PEN NEEDLES 31G X 5 MM MISC   . Iron-FA-B Cmp-C-Biot-Probiotic (FUSION PLUS) CAPS Take 1 capsule by mouth daily.  Marland Kitchen levocetirizine (XYZAL) 5 MG tablet TAKE 1 TABLET (5 MG TOTAL) BY MOUTH EVERY EVENING.  . Liraglutide -Weight Management (SAXENDA) 18 MG/3ML SOPN Inject 3 mg into the skin daily.  . SUMAtriptan (IMITREX) 100 MG tablet TAKE 1 TABLET BY MOUTH EVERY 2HRS AS NEEDED FOR MIGRAINE. (MAY REPEAT IN 2HRS IF HEADACHE PERSIST)  . SUMAtriptan (IMITREX) 6 MG/0.5ML SOSY injection Inject 0.5 mLs (6 mg total) into the skin every 2 (two) hours as needed for migraine or headache. F  . Tretinoin Microsphere (RETIN-A MICRO PUMP) 0.06 % GEL Apply 1 application topically at bedtime.  . [DISCONTINUED] Iron-FA-B Cmp-C-Biot-Probiotic (FUSION PLUS) CAPS Take 1 capsule by mouth daily.  . [DISCONTINUED] SAXENDA 18 MG/3ML SOPN INJECT 3 MG INTO THE SKIN DAILY.   No facility-administered encounter medications on file as of 02/05/2017.           Objective:   Physical Exam  Constitutional: She is oriented to person, place, and time. She appears well-developed and well-nourished.  HENT:  Head: Normocephalic and atraumatic.  Right Ear: External ear normal.  Left Ear: External ear normal.  Nose: Nose normal.  Mouth/Throat: Oropharynx is clear and moist.  TMs and canals are clear.   Eyes: Conjunctivae and EOM are normal. Pupils are equal, round, and reactive to light.  Neck: Neck supple. No thyromegaly present.  Cardiovascular: Normal rate, regular rhythm and normal heart sounds.  Pulmonary/Chest: Effort normal and breath sounds normal. She has no wheezes.  Lymphadenopathy:    She has no cervical adenopathy.  Neurological: She is alert and oriented to person, place, and time. She displays normal reflexes. No cranial nerve deficit. She  exhibits normal muscle tone. Coordination normal.  Positive Dix-Hallpike maneuver to the left.  She had a little bit of vertical nystagmus with it.  Symptoms were mild though. Normal gait.   Skin: Skin is warm and dry.  Psychiatric: She has a normal mood and affect.       Assessment & Plan:  BBPV -discussed diagnosis.  Given handout on exercises to do on her own to resolve the symptoms.  She can also use over-the-counter nondrowsy Dramamine if needed.  If symptoms are not resolving in the next 1-2 weeks and please give [place] a call and  will refer for vestibular rehab if needed.  Chest pain/palpitations-EKG performed today.  EKG shows rate of 59 bpm, normal sinus rhythm.  Some poor R wave progression in the lateral leads.  Otherwise no significant change from previous.  I think probably related to stress and anxiety

## 2017-03-30 ENCOUNTER — Emergency Department (INDEPENDENT_AMBULATORY_CARE_PROVIDER_SITE_OTHER)
Admission: EM | Admit: 2017-03-30 | Discharge: 2017-03-30 | Disposition: A | Discharge status: Home / Self Care | Payer: 59 | Source: Home / Self Care | Attending: Emergency Medicine | Admitting: Emergency Medicine

## 2017-03-30 ENCOUNTER — Encounter: Payer: Self-pay | Admitting: Emergency Medicine

## 2017-03-30 DIAGNOSIS — J111 Influenza due to unidentified influenza virus with other respiratory manifestations: Secondary | ICD-10-CM

## 2017-03-30 DIAGNOSIS — R69 Illness, unspecified: Secondary | ICD-10-CM

## 2017-03-30 DIAGNOSIS — R197 Diarrhea, unspecified: Secondary | ICD-10-CM

## 2017-03-30 DIAGNOSIS — R112 Nausea with vomiting, unspecified: Secondary | ICD-10-CM

## 2017-03-30 LAB — POCT INFLUENZA A/B
Influenza A, POC: NEGATIVE
Influenza B, POC: NEGATIVE

## 2017-03-30 MED ORDER — HYOSCYAMINE SULFATE 0.125 MG PO TABS: ORAL_TABLET | ORAL | 0 refills | Status: DC

## 2017-03-30 MED ORDER — ONDANSETRON 4 MG PO TBDP: 4.0000 mg | ORAL_TABLET | Freq: Three times a day (TID) | ORAL | 0 refills | Status: DC | PRN

## 2017-03-30 MED ORDER — OSELTAMIVIR PHOSPHATE 75 MG PO CAPS: ORAL_CAPSULE | ORAL | 0 refills | Status: DC

## 2017-03-30 NOTE — ED Provider Notes (Signed)
[facility]    CSN: [id number] Arrival date & time: 03/30/17  1454     History   Chief Complaint Chief Complaint  Patient presents with  . Influenza    HPI Jamie Mccarthy is a 42 y.o. female.   HPI FLU  HPI : Acute Flu symptoms for 2 days. Fever to 100 with chills, sweats, myalgias, fatigue, headache. Symptoms are progressively worsening, despite trying OTC fever reducing medicine and rest and fluids.  Has decreased appetite, but tolerating some liquids by mouth. No history of recent tick bite. Had 2 episodes of vomiting yesterday, and still feels nauseated.  2 episodes of loose watery stools today.  No blood or mucus. Review of Systems: Positive for fatigue, mild nasal congestion, minimal sore throat, mild swollen anterior neck glands. Negative for acute vision changes, stiff neck, focal weakness, syncope, seizures, respiratory distress, cough, GU symptoms, new rash.  Past Medical History:  Diagnosis Date  . Anemia    Has sickle cell trait  . GERD (gastroesophageal reflux disease)    diet controlled  . Gestational diabetes   . H/O vaginal delivery 2010  . Headache(784.0)    Migraines  . Herpes 2007   hx of  . Hypertension    no current medications  . Iron deficiency anemia, unspecified 10/20/2012  . PONV (postoperative nausea and vomiting)    has had PONV with previous c/s and also lap band surgery 2004  . Seasonal allergies     Patient Active Problem List   Diagnosis Date Noted  . Trochanteric bursitis, right hip 03/06/2016  . Menorrhagia 10/12/2015  . Fibroid 10/12/2015  . S/P laparoscopic hysterectomy 10/12/2015  . Lumbosacral strain 08/18/2014  . Iron deficiency anemia, unspecified 10/20/2012  . H/O laparoscopic adjustable gastric banding 02/2003 10/02/2012  . Regurgitation 10/02/2012  . S/P repeat low transverse C-section 07/17/2012  . Sickle cell trait ([place]) 12/10/2011  . Essential hypertension 09/11/2010  . Allergic rhinitis 06/14/2010    . GERD 12/27/2008  . OBESITY NOS 12/03/2005  . 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 RHINITIS, ALLERGIC 10/30/2005    Past Surgical History:  Procedure Laterality Date  . BILATERAL SALPINGECTOMY  07/16/2012   Procedure: BILATERAL DISTAL SALPINGECTOMY;  Surgeon: Logan Bores, MD;  Location: [facility];  Service: Obstetrics;;  . CESAREAN SECTION  2012  . CESAREAN SECTION N/A 07/16/2012   Procedure: REPEAT CESAREAN SECTION;  Surgeon: Logan Bores, MD;  Location: [facility];  Service: Obstetrics;  Laterality: N/A;  . CHOLECYSTECTOMY N/A 11/14/2012   Procedure: LAPAROSCOPIC CHOLECYSTECTOMY;  Surgeon: Gayland Curry, MD;  Location[facility];  Service: General;  Laterality: N/A;  . GASTRIC BANDING PORT REVISION N/A 11/14/2012   Procedure: GASTRIC BANDING PORT REVISION;  Surgeon: Gayland Curry, MD;  Location[facility];  Service: General;  Laterality: N/A;  PORT PLACEMENT MOVED NEW MESH INSERTED   . LAPAROSCOPIC GASTRIC BANDING  2005  . LAPAROSCOPIC VAGINAL HYSTERECTOMY WITH SALPINGECTOMY Bilateral 10/12/2015   Procedure: HYSTERECTOMY TOTAL LAPAROSCOPIC BILATERAL SALPINGECTOMY;  Surgeon: Sherlyn Hay, DO;  Location: [facility];  Service: Gynecology;  Laterality: Bilateral;  . reposition gastric band  2006   x2  . TUBAL LIGATION  2014  . WISDOM TOOTH EXTRACTION  1999    OB History    Gravida Para Term Preterm AB Living   3 3 2 1  0 4   SAB TAB Ectopic Multiple Live Births   0 0 0 1 4     She denies chance of pregnancy, status  post hysterectomy  Home Medications    Prior to Admission medications   Medication Sig Start Date End Date Taking? Authorizing Provider  B-D UF III MINI PEN NEEDLES 31G X 5 MM MISC  08/03/16   [provider]  hyoscyamine (LEVSIN, ANASPAZ) 0.125 MG tablet Take one by mouth every 4-6 hours as needed for abdominal cramping or pain 03/30/17   Jacqulyn Cane, MD  Iron-FA-B Cmp-C-Biot-Probiotic (FUSION PLUS) CAPS Take 1 capsule by mouth daily. 02/05/17   Hali Marry, MD  levocetirizine (XYZAL) 5 MG tablet TAKE 1 TABLET (5 MG TOTAL) BY MOUTH EVERY EVENING. 11/08/16   Hali Marry, MD  Liraglutide -Weight Management (SAXENDA) 18 MG/3ML SOPN Inject 3 mg into the skin daily. 02/05/17   Hali Marry, MD  ondansetron (ZOFRAN-ODT) 4 MG disintegrating tablet Take 1 tablet (4 mg total) by mouth every 8 (eight) hours as needed for nausea or vomiting. 03/30/17   Jacqulyn Cane, MD  oseltamivir (TAMIFLU) 75 MG capsule Starting today, take 1 capsule by mouth twice a day for 5 days. 03/30/17   Jacqulyn Cane, MD  SUMAtriptan (IMITREX) 100 MG tablet TAKE 1 TABLET BY MOUTH EVERY 2HRS AS NEEDED FOR MIGRAINE. (MAY REPEAT IN 2HRS IF HEADACHE PERSIST) 02/01/17   [provider]  SUMAtriptan (IMITREX) 6 MG/0.5ML SOSY injection Inject 0.5 mLs (6 mg total) into the skin every 2 (two) hours as needed for migraine or headache. F 09/26/16   Hali Marry, MD  Tretinoin Microsphere (RETIN-A MICRO PUMP) 0.06 % GEL Apply 1 application topically at bedtime. 07/23/16   Hali Marry, MD    Family History Family History  Problem Relation Age of Onset  . Hypertension Mother   . Diabetes Unknown        father's family  . Thyroid cancer Unknown   . Breast cancer Maternal Grandmother     Social History Social History   Tobacco Use  . Smoking status: Never Smoker  . Smokeless tobacco: Never Used  Substance Use Topics  . Alcohol use: Yes    Alcohol/week: 0.0 oz    Comment: occ  . Drug use: No     Allergies   Latex and Naproxen   Review of Systems Review of Systems  All other systems reviewed and are negative.    Physical Exam Triage Vital Signs ED Triage Vitals  Enc Vitals Group     BP 03/30/17 1630 (!) 145/101     Pulse Rate 03/30/17 1630 72     Resp 03/30/17 1630 16     Temp 03/30/17 1630 98.3 F (36.8 C)     Temp Source 03/30/17 1630 Oral     SpO2 03/30/17 1630 98 %     Weight 03/30/17 1632 171 lb 4 oz (77.7 kg)      Height 03/30/17 1632 5\' 4"  (1.626 m)     Head Circumference --      Peak Flow --      Pain Score 03/30/17 1631 2     Pain Loc --      Pain Edu? --      Excl. in [place]? --    No data found.  Updated Vital Signs BP (!) 145/101 (BP Location: Right Arm)   Pulse 72   Temp 98.3 F (36.8 C) (Oral)   Resp 16   Ht 5\' 4"  (1.626 m)   Wt 171 lb 4 oz (77.7 kg)   SpO2 98%   BMI 29.39 kg/m   Visual Acuity  Right Eye Distance:   Left Eye Distance:   Bilateral Distance:    Right Eye Near:   Left Eye Near:    Bilateral Near:     Physical Exam  Constitutional: She appears well-developed and well-nourished.  Non-toxic appearance. She appears ill (very fatigued, but no cardiorespiratory distress). No distress.  HENT:  Head: Normocephalic and atraumatic.  Right Ear: Tympanic membrane and external ear normal.  Left Ear: Tympanic membrane and external ear normal.  Nose: Rhinorrhea present.  Mouth/Throat: Mucous membranes are normal. Posterior oropharyngeal erythema (mild redness ) present. No oropharyngeal exudate.  Eyes: Conjunctivae are normal. Right eye exhibits no discharge. Left eye exhibits no discharge. No scleral icterus.  Neck: Neck supple.  Cardiovascular: Normal rate, regular rhythm and normal heart sounds.  Pulmonary/Chest: Breath sounds normal. No stridor. No respiratory distress. She has no wheezes. She has no rales.  Abdominal: Soft. There is no tenderness.  Musculoskeletal: She exhibits no edema.  Lymphadenopathy:    She has cervical adenopathy (mild shoddy anterior cervical nodes).  Neurological: She is alert.  Skin: Skin is warm and intact. No rash noted. She is diaphoretic.  Psychiatric: She has a normal mood and affect.  Nursing note and vitals reviewed.    UC Treatments / Results  Labs (all labs ordered are listed, but only abnormal results are displayed) Labs Reviewed  POCT INFLUENZA A/B  Rapid flu test negative today  EKG  EKG Interpretation None        Radiology No results found.  Procedures Procedures (including critical care time)  Medications Ordered in UC Medications - No data to display   Initial Impression / Assessment and Plan / UC Course  I have reviewed the triage vital signs and the nursing notes.  Pertinent labs & imaging results that were available during my care of the patient were reviewed by me and considered in my medical decision making (see chart for details).   rapid flu tests negative for influenza A and B.-However, these could be false negatives, and we are in the midst of a flu epidemic in this region. Clinically, she has classical signs and symptoms and physical exam findings for influenza.. After risks benefits alternatives discussed, she agrees with starting Tamiflu. Levsin and Zofran prescribed also, prn. Note written to excuse from work through Tuesday. Follow-up with your primary care doctor in 5-7 days if not improving, or sooner if symptoms become worse. Precautions discussed. Red flags discussed. Questions invited and answered. Patient voiced understanding and agreement.   Final Clinical Impressions(s) / UC Diagnoses   Final diagnoses:  Influenza-like illness  Nausea vomiting and diarrhea    ED Discharge Orders        Ordered    oseltamivir (TAMIFLU) 75 MG capsule     03/30/17 1732    hyoscyamine (LEVSIN, ANASPAZ) 0.125 MG tablet     03/30/17 1732    ondansetron (ZOFRAN-ODT) 4 MG disintegrating tablet  Every 8 hours PRN     03/30/17 1732         Jacqulyn Cane, MD 04/01/17 1835

## 2017-03-30 NOTE — ED Triage Notes (Signed)
Patient presents to [facility] with C/O body aches, fatigue, nausea , vomiting and diarrhea times two since yesterday. Chills patient states she feels very tired

## 2017-04-07 ENCOUNTER — Other Ambulatory Visit: Payer: Self-pay | Admitting: Family Medicine

## 2017-04-08 ENCOUNTER — Other Ambulatory Visit: Payer: Self-pay | Admitting: *Deleted

## 2017-05-02 ENCOUNTER — Other Ambulatory Visit: Payer: Self-pay | Admitting: *Deleted

## 2017-05-02 MED ORDER — LEVOCETIRIZINE DIHYDROCHLORIDE 5 MG PO TABS: 5.0000 mg | ORAL_TABLET | Freq: Every evening | ORAL | 3 refills | Status: DC

## 2017-05-13 ENCOUNTER — Other Ambulatory Visit: Payer: Self-pay | Admitting: Family Medicine

## 2017-06-08 ENCOUNTER — Other Ambulatory Visit: Payer: Self-pay | Admitting: Family Medicine

## 2017-06-10 ENCOUNTER — Other Ambulatory Visit: Payer: Self-pay | Admitting: Family Medicine

## 2017-06-10 ENCOUNTER — Ambulatory Visit: Payer: 59 | Admitting: Family Medicine

## 2017-06-10 MED ORDER — LIRAGLUTIDE -WEIGHT MANAGEMENT 18 MG/3ML ~~LOC~~ SOPN: 3.0000 mg | PEN_INJECTOR | Freq: Every day | SUBCUTANEOUS | 2 refills | Status: DC

## 2017-06-19 DIAGNOSIS — N898 Other specified noninflammatory disorders of vagina: Secondary | ICD-10-CM | POA: Diagnosis not present

## 2017-06-20 DIAGNOSIS — Z9884 Bariatric surgery status: Secondary | ICD-10-CM | POA: Diagnosis not present

## 2017-06-28 ENCOUNTER — Other Ambulatory Visit: Payer: Self-pay | Admitting: Student

## 2017-06-28 DIAGNOSIS — Z4651 Encounter for fitting and adjustment of gastric lap band: Secondary | ICD-10-CM

## 2017-07-02 ENCOUNTER — Ambulatory Visit: Payer: 59 | Admitting: Family Medicine

## 2017-07-02 ENCOUNTER — Encounter: Payer: Self-pay | Admitting: Family Medicine

## 2017-07-02 VITALS — BP 132/77 | HR 77 | Ht 65.0 in | Wt 177.0 lb

## 2017-07-02 DIAGNOSIS — F329 Major depressive disorder, single episode, unspecified: Secondary | ICD-10-CM

## 2017-07-02 DIAGNOSIS — F419 Anxiety disorder, unspecified: Secondary | ICD-10-CM | POA: Diagnosis not present

## 2017-07-02 MED ORDER — SERTRALINE HCL 50 MG PO TABS: ORAL_TABLET | ORAL | 3 refills | Status: DC

## 2017-07-02 NOTE — Progress Notes (Signed)
Subjective:    Patient ID: Jamie Mccarthy, female    DOB: Feb 14, 1975, 43 y.o.   MRN: [id number]  HPI  42 yo female c/o of anxiety.  She says over the last several months it just seems to be building.  She has 3 children at home.  Her husband works the night shift and so he really does not contribute a lot to the home life and is not always very supportive.  She says her daughter was recently diagnosed with type 1 diabetes and this is just been a lot of stress on her.  She also works full-time.  She feels like she is going to have a mental break.  Says she can just drop cry at the drop of hat.  She still enjoys her work.  She is not resting well.  She feels down and sad and depressed but no thoughts of wanting to harm herself.  She said it she did go see a counselor who recommended that she follow-up with her doctor and discuss what is going on.  She is requesting a short-term leave from work.  Review of Systems  BP 132/77   Pulse 77   Ht 5\' 5"  (1.651 m)   Wt 177 lb (80.3 kg)   BMI 29.45 kg/m     Allergies  Allergen Reactions  . Latex Itching    Pt states she gets severely itchy with latex  . Naproxen Rash    Past Medical History:  Diagnosis Date  . Anemia    Has sickle cell trait  . GERD (gastroesophageal reflux disease)    diet controlled  . Gestational diabetes   . H/O vaginal delivery 2010  . Headache(784.0)    Migraines  . Herpes 2007   hx of  . Hypertension    no current medications  . Iron deficiency anemia, unspecified 10/20/2012  . PONV (postoperative nausea and vomiting)    has had PONV with previous c/s and also lap band surgery 2004  . Seasonal allergies     Past Surgical History:  Procedure Laterality Date  . BILATERAL SALPINGECTOMY  07/16/2012   Procedure: BILATERAL DISTAL SALPINGECTOMY;  Surgeon: Logan Bores, MD;  Location: [facility];  Service: Obstetrics;;  . CESAREAN SECTION  2012  . CESAREAN SECTION N/A 07/16/2012   Procedure: REPEAT CESAREAN  SECTION;  Surgeon: Logan Bores, MD;  Location: [facility];  Service: Obstetrics;  Laterality: N/A;  . CHOLECYSTECTOMY N/A 11/14/2012   Procedure: LAPAROSCOPIC CHOLECYSTECTOMY;  Surgeon: Gayland Curry, MD;  Location: [facility];  Service: General;  Laterality: N/A;  . GASTRIC BANDING PORT REVISION N/A 11/14/2012   Procedure: GASTRIC BANDING PORT REVISION;  Surgeon: Gayland Curry, MD;  Location: [facility];  Service: General;  Laterality: N/A;  PORT PLACEMENT MOVED NEW MESH INSERTED   . LAPAROSCOPIC GASTRIC BANDING  2005  . LAPAROSCOPIC VAGINAL HYSTERECTOMY WITH SALPINGECTOMY Bilateral 10/12/2015   Procedure: HYSTERECTOMY TOTAL LAPAROSCOPIC BILATERAL SALPINGECTOMY;  Surgeon: Sherlyn Hay, DO;  Location: [facility];  Service: Gynecology;  Laterality: Bilateral;  . reposition gastric band  2006   x2  . TUBAL LIGATION  2014  . WISDOM TOOTH EXTRACTION  1999    Social History   Socioeconomic History  . Marital status: Married    Spouse name: Not on file  . Number of children: 1  . Years of education: Not on file  . Highest education level: Not on file  Occupational History  . Occupation: Technical sales engineer  Employer: [employer]  Social Needs  . Financial resource strain: Not on file  . Food insecurity:    Worry: Not on file    Inability: Not on file  . Transportation needs:    Medical: Not on file    Non-medical: Not on file  Tobacco Use  . Smoking status: Never Smoker  . Smokeless tobacco: Never Used  Substance and Sexual Activity  . Alcohol use: Yes    Alcohol/week: 0.0 oz    Comment: occ  . Drug use: No  . Sexual activity: Yes    Comment: separated, 2 caffeine drinks daily.   Lifestyle  . Physical activity:    Days per week: Not on file    Minutes per session: Not on file  . Stress: Not on file  Relationships  . Social connections:    Talks on phone: Not on file    Gets together: Not on file    Attends religious service: Not on file    Active member of club or  organization: Not on file    Attends meetings of clubs or organizations: Not on file    Relationship status: Not on file  . Intimate partner violence:    Fear of current or ex partner: Not on file    Emotionally abused: Not on file    Physically abused: Not on file    Forced sexual activity: Not on file  Other Topics Concern  . Not on file  Social History Narrative   2 caffeine drinks per day    Family History  Problem Relation Age of Onset  . Hypertension Mother   . Diabetes Unknown        father's family  . Thyroid cancer Unknown   . Breast cancer Maternal Grandmother     Outpatient Encounter Medications as of 07/02/2017  Medication Sig  . B-D UF III MINI PEN NEEDLES 31G X 5 MM MISC USE TO INJECT INTO SKIN DAILY AS DIRECTED  . Iron-FA-B Cmp-C-Biot-Probiotic (FUSION PLUS) CAPS Take 1 capsule by mouth daily.  Marland Kitchen levocetirizine (XYZAL) 5 MG tablet Take 1 tablet (5 mg total) by mouth every evening.  . Liraglutide -Weight Management (SAXENDA) 18 MG/3ML SOPN Inject 3 mg into the skin daily.  . SUMAtriptan (IMITREX) 100 MG tablet TAKE 1 TABLET BY MOUTH EVERY 2HRS AS NEEDED FOR MIGRAINE. (MAY REPEAT IN 2HRS IF HEADACHE PERSIST)  . Tretinoin Microsphere (RETIN-A MICRO PUMP) 0.06 % GEL Apply 1 application topically at bedtime.  . sertraline (ZOLOFT) 50 MG tablet 1/2 tab po QD x 6 days the increase to whole tab daily  . [DISCONTINUED] hyoscyamine (LEVSIN, ANASPAZ) 0.125 MG tablet Take one by mouth every 4-6 hours as needed for abdominal cramping or pain  . [DISCONTINUED] ondansetron (ZOFRAN-ODT) 4 MG disintegrating tablet Take 1 tablet (4 mg total) by mouth every 8 (eight) hours as needed for nausea or vomiting.   No facility-administered encounter medications on file as of 07/02/2017.          Objective:   Physical Exam  Constitutional: She is oriented to person, place, and time. She appears well-developed and well-nourished.  HENT:  Head: Normocephalic and atraumatic.  Eyes:  Conjunctivae and EOM are normal.  Cardiovascular: Normal rate.  Pulmonary/Chest: Effort normal.  Neurological: She is alert and oriented to person, place, and time.  Skin: Skin is dry. No pallor.  Psychiatric: She has a normal mood and affect. Her behavior is normal.  Vitals reviewed.  Assessment & Plan:  Anxiety and depression-PHQ 9 score of 22 and gad 7 score of 20.  Discussed options.  She is already seen a counselor and I encouraged her to try to get back in the next 2 weeks for follow-up appointment and to start going regularly.  In addition we discussed starting medication we will start with Zoloft.  Discussed medication.  I really think we should go ahead and start something now as it often times takes several weeks for her to build up I want her to be able to return to work safely and in a better mental state.  I will see her back in 2 weeks and I will write her out of work for 2 weeks currently.    Time spent 25 minutes, greater than 50% of time spent counseling about depression and anxiety-

## 2017-07-05 ENCOUNTER — Ambulatory Visit
Admission: RE | Admit: 2017-07-05 | Discharge: 2017-07-05 | Disposition: A | Discharge status: Home / Self Care | Payer: 59 | Source: Ambulatory Visit | Attending: Student | Admitting: Student

## 2017-07-05 ENCOUNTER — Other Ambulatory Visit: Payer: Self-pay | Admitting: Student

## 2017-07-05 DIAGNOSIS — R1310 Dysphagia, unspecified: Secondary | ICD-10-CM | POA: Diagnosis not present

## 2017-07-05 DIAGNOSIS — Z4651 Encounter for fitting and adjustment of gastric lap band: Secondary | ICD-10-CM

## 2017-07-05 IMAGING — RF DG UGI W/ KUB
8 series · 14 of 24 positions shown · non-contrast
Comparison: [DATE]

CLINICAL DATA: Dysphagia and abdominal pain. History of gastric lap
band.

EXAM:
UPPER GI SERIES WITH KUB
TECHNIQUE: After obtaining a scout radiograph a routine upper GI series was
performed using thin and high density barium.
FLUOROSCOPY TIME:  Fluoroscopy Time:  2 minutes and 24 seconds
Radiation Exposure Index (if provided by the fluoroscopic device):
172 mGy
Number of Acquired Spot Images: 0

[Series 1: one shot · 1 of 1 slices shown (1 of 3)]
[im 1/1]
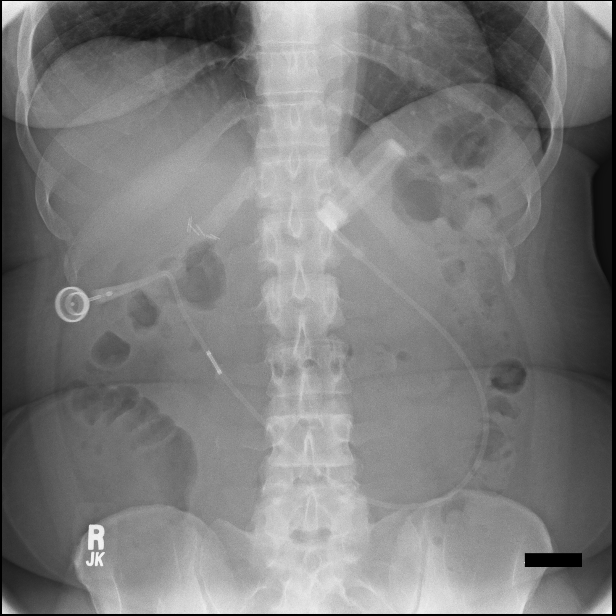

[Series 2: sequence · 1 of 14 frames shown (1 of 5)]
[frame 12/14]
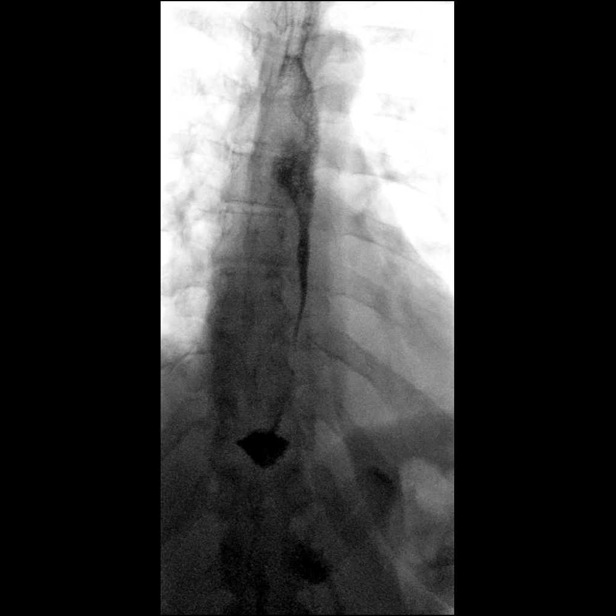

[Series 3: sequence · 2 of 24 frames shown (2 of 5)]
[frame 1/24]
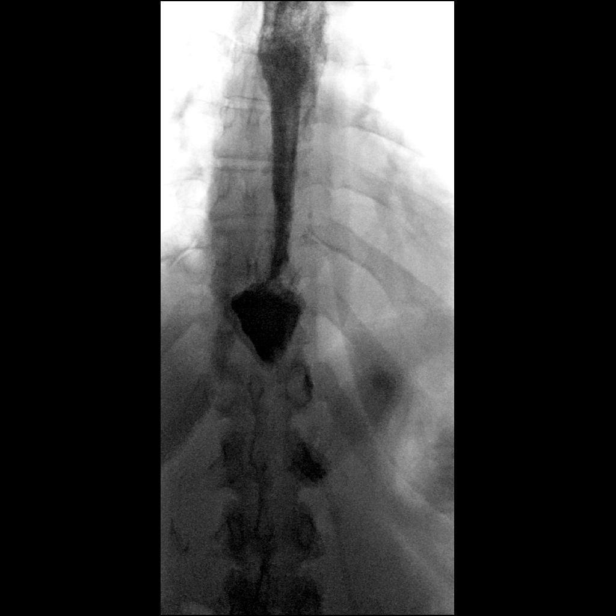
[frame 21/24]
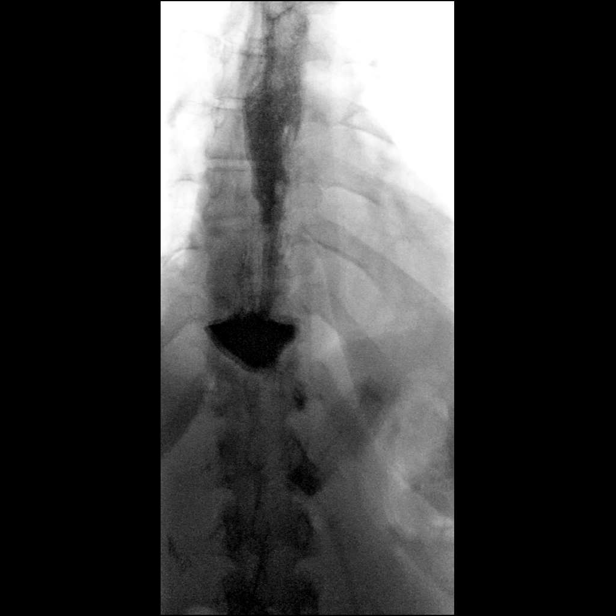

[Series 4: sequence · 1 of 12 frames shown (3 of 5)]
[frame 2/12]
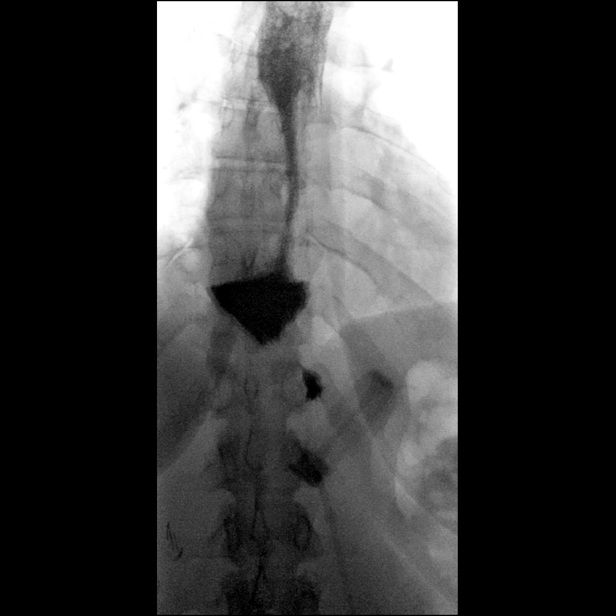

[Series 5: one shot · 1 of 1 slices shown (2 of 3)]
[im 1/1]
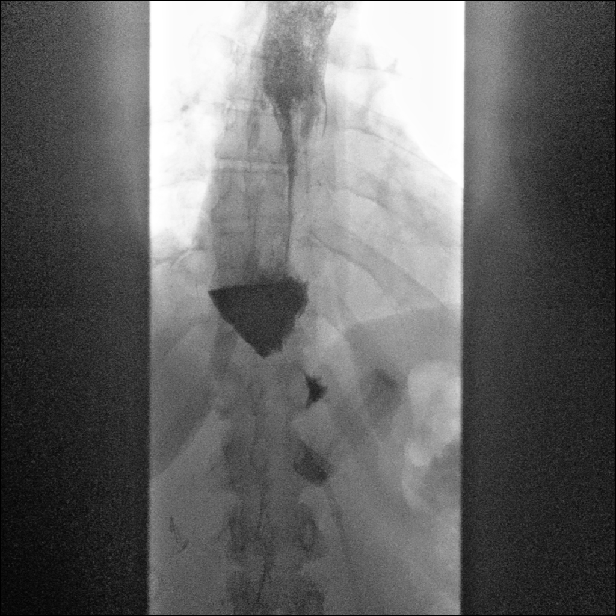

[Series 6: sequence · 2 of 14 frames shown (4 of 5)]
[frame 8/14]
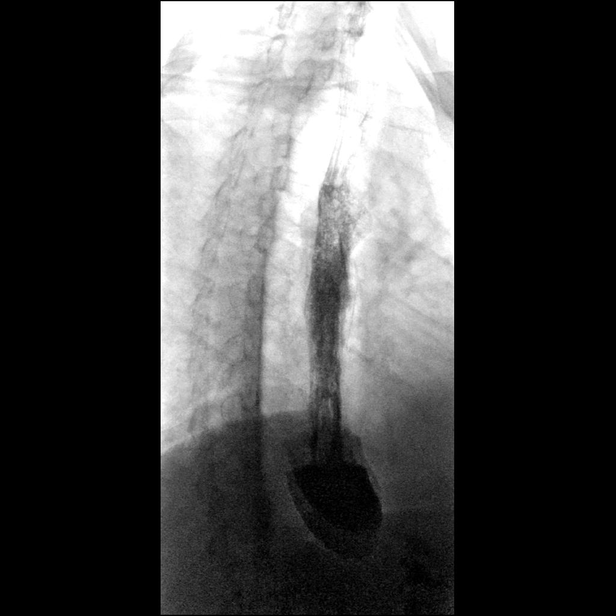
[frame 12/14]
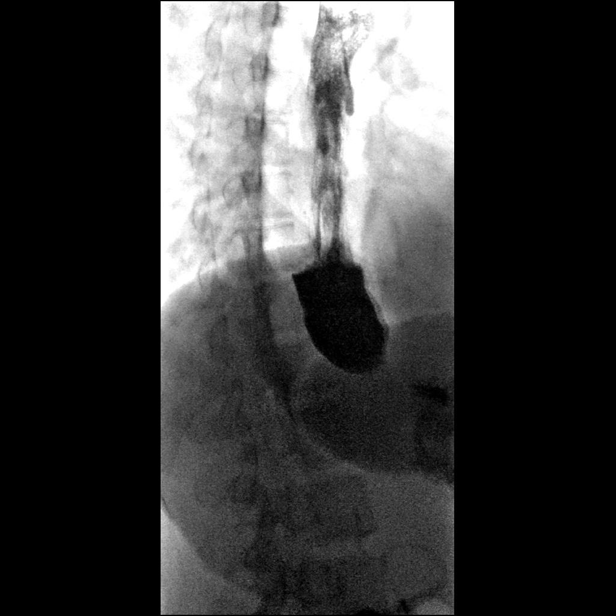

[Series 7: sequence · 1 of 16 frames shown (5 of 5)]
[frame 3/16]
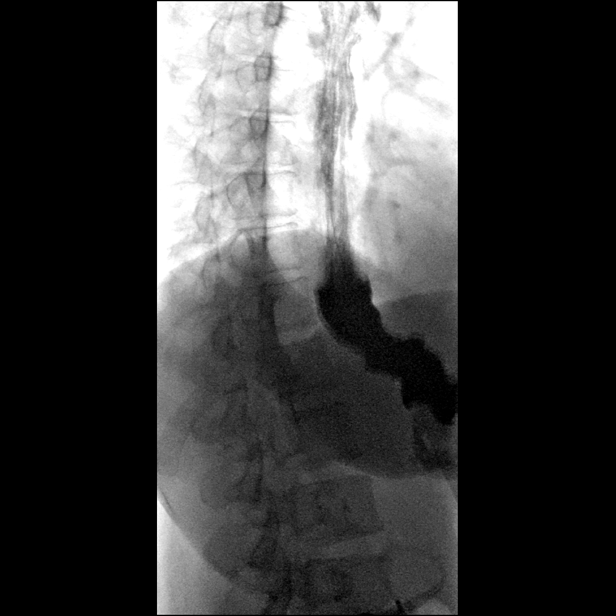

[Series 8: one shot · 5 of 10 slices shown (3 of 3)]
[im 1/10]
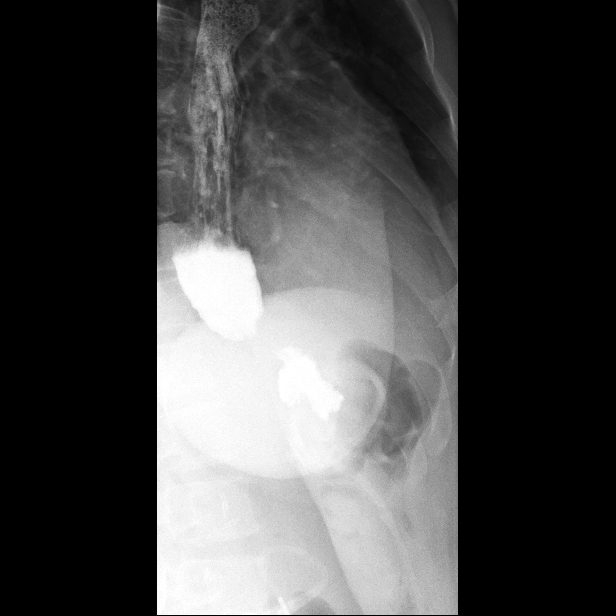
[im 3/10]
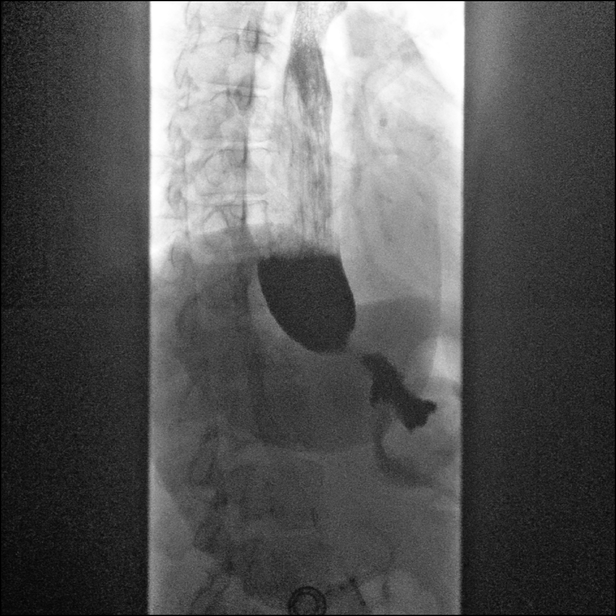
[im 5/10]
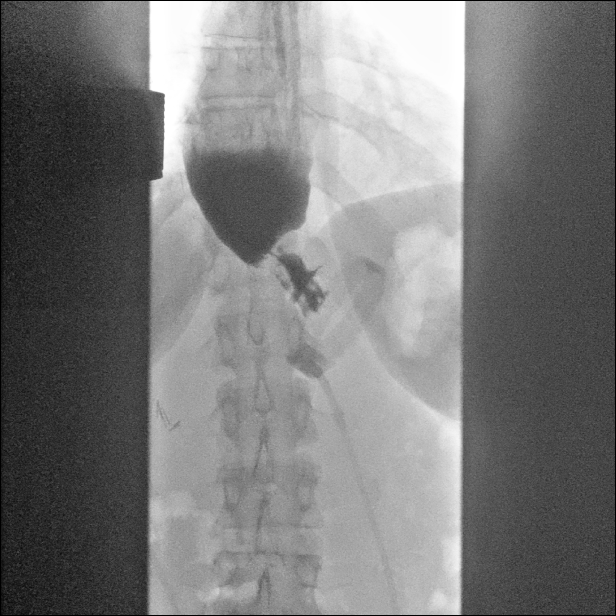
[im 7/10]
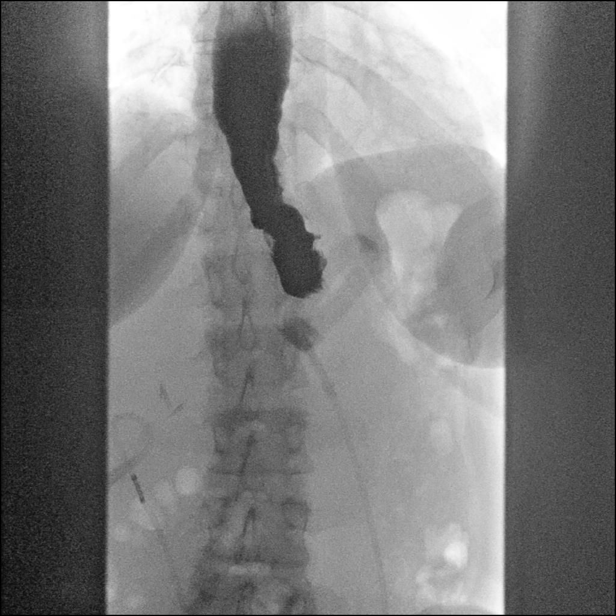
[im 10/10]
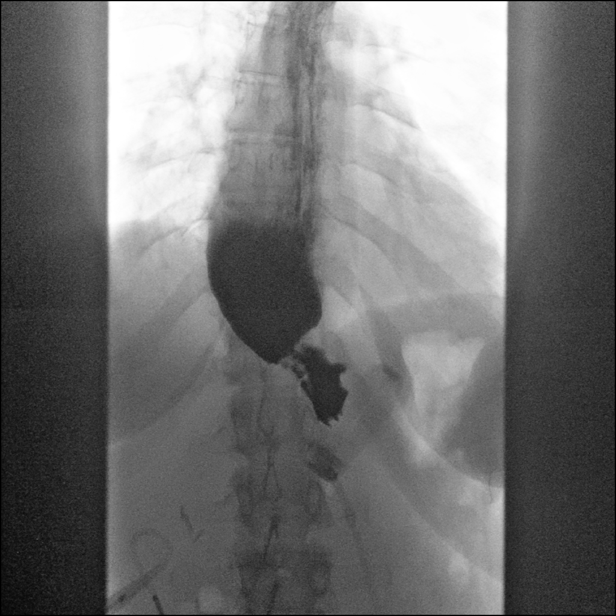

[14 of 24 positions shown; findings below may reference images not displayed]

FINDINGS: The gastric lap and appears to be in good position in the left upper
quadrant with normal [DATE] to [DATE] orientation.

Markedly dilated esophagus with dysmotility. Narrowing at the GE
junction. Could not exclude achalasia or a distal stricture. Some
barium did pass into the small portion of the fundal region the
stomach but no contrast went through the gastric lap band.
IMPRESSION: 1. Markedly dilated esophagus with dysmotility. This has the
appearance of achalasia but may be due to a chronic distal stricture
also. Endoscopy and esophageal manometry may be helpful.
2. No contrast would pass through the gastric lap band.

## 2017-07-08 ENCOUNTER — Telehealth: Payer: Self-pay | Admitting: *Deleted

## 2017-07-08 NOTE — Telephone Encounter (Signed)
Forms completed, faxed,copied,scanned,confirmation received.Elouise Munroe, [place]

## 2017-07-11 DIAGNOSIS — Z4651 Encounter for fitting and adjustment of gastric lap band: Secondary | ICD-10-CM | POA: Diagnosis not present

## 2017-07-11 DIAGNOSIS — Z9884 Bariatric surgery status: Secondary | ICD-10-CM | POA: Diagnosis not present

## 2017-07-13 ENCOUNTER — Encounter (HOSPITAL_COMMUNITY): Payer: Self-pay

## 2017-07-13 ENCOUNTER — Emergency Department (HOSPITAL_COMMUNITY): Payer: 59

## 2017-07-13 ENCOUNTER — Other Ambulatory Visit: Payer: Self-pay

## 2017-07-13 ENCOUNTER — Emergency Department (HOSPITAL_COMMUNITY)
Admission: EM | Admit: 2017-07-13 | Discharge: 2017-07-13 | Disposition: A | Discharge status: Home / Self Care | Payer: 59 | Attending: Emergency Medicine | Admitting: Emergency Medicine

## 2017-07-13 DIAGNOSIS — R110 Nausea: Secondary | ICD-10-CM | POA: Insufficient documentation

## 2017-07-13 DIAGNOSIS — R079 Chest pain, unspecified: Secondary | ICD-10-CM | POA: Diagnosis not present

## 2017-07-13 DIAGNOSIS — R0602 Shortness of breath: Secondary | ICD-10-CM | POA: Insufficient documentation

## 2017-07-13 DIAGNOSIS — R61 Generalized hyperhidrosis: Secondary | ICD-10-CM | POA: Diagnosis not present

## 2017-07-13 DIAGNOSIS — Z91040 Latex allergy status: Secondary | ICD-10-CM | POA: Diagnosis not present

## 2017-07-13 DIAGNOSIS — Z79899 Other long term (current) drug therapy: Secondary | ICD-10-CM | POA: Diagnosis not present

## 2017-07-13 DIAGNOSIS — R0902 Hypoxemia: Secondary | ICD-10-CM | POA: Diagnosis not present

## 2017-07-13 DIAGNOSIS — R0789 Other chest pain: Secondary | ICD-10-CM | POA: Diagnosis not present

## 2017-07-13 DIAGNOSIS — I10 Essential (primary) hypertension: Secondary | ICD-10-CM | POA: Diagnosis not present

## 2017-07-13 LAB — BASIC METABOLIC PANEL
Anion gap: 5 (ref 5–15)
BUN: 7 mg/dL (ref 6–20)
CALCIUM: 8.8 mg/dL — AB (ref 8.9–10.3)
CO2: 26 mmol/L (ref 22–32)
CREATININE: 0.72 mg/dL (ref 0.44–1.00)
Chloride: 108 mmol/L (ref 101–111)
GFR calc non Af Amer: >60 mL/min (ref >60)
Glucose, Bld: 108 mg/dL — ABNORMAL HIGH (ref 65–99)
Potassium: 4.1 mmol/L (ref 3.5–5.1)
Sodium: 139 mmol/L (ref 135–145)

## 2017-07-13 LAB — I-STAT BETA HCG BLOOD, ED (MC, WL, AP ONLY)

## 2017-07-13 LAB — CBC
HCT: 40.3 % (ref 36.0–46.0)
Hemoglobin: 13.3 g/dL (ref 12.0–15.0)
MCH: 26.5 pg (ref 26.0–34.0)
MCHC: 33 g/dL (ref 30.0–36.0)
MCV: 80.4 fL (ref 78.0–100.0)
PLATELETS: 291 10*3/uL (ref 150–400)
RBC: 5.01 MIL/uL (ref 3.87–5.11)
RDW: 13.6 % (ref 11.5–15.5)
WBC: 6 10*3/uL (ref 4.0–10.5)

## 2017-07-13 LAB — I-STAT TROPONIN, ED
TROPONIN I, POC: 0 ng/mL (ref 0.00–0.08)
Troponin i, poc: 0 ng/mL (ref 0.00–0.08)

## 2017-07-13 IMAGING — DX DG CHEST 2V
2 series · 2 of 2 positions shown · non-contrast
Comparison: Chest x-ray dated [DATE].

CLINICAL DATA: Chest pain and shortness of breath.

EXAM:
CHEST - 2 VIEW

[chest pa]
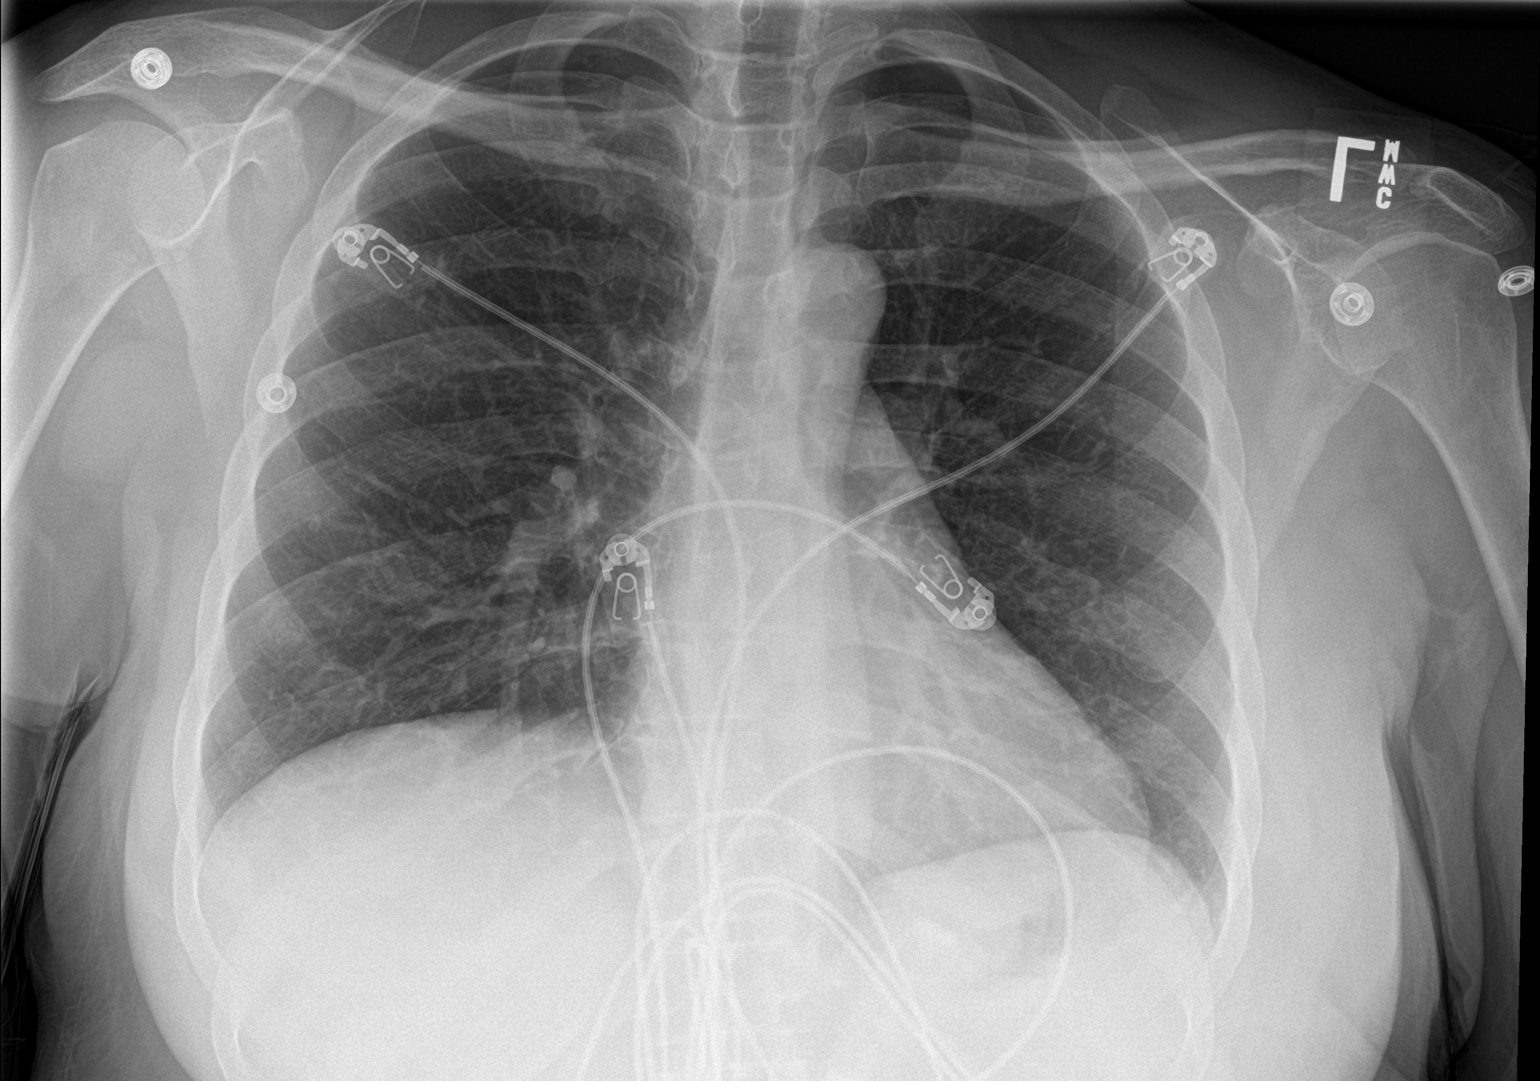

[chest lat]
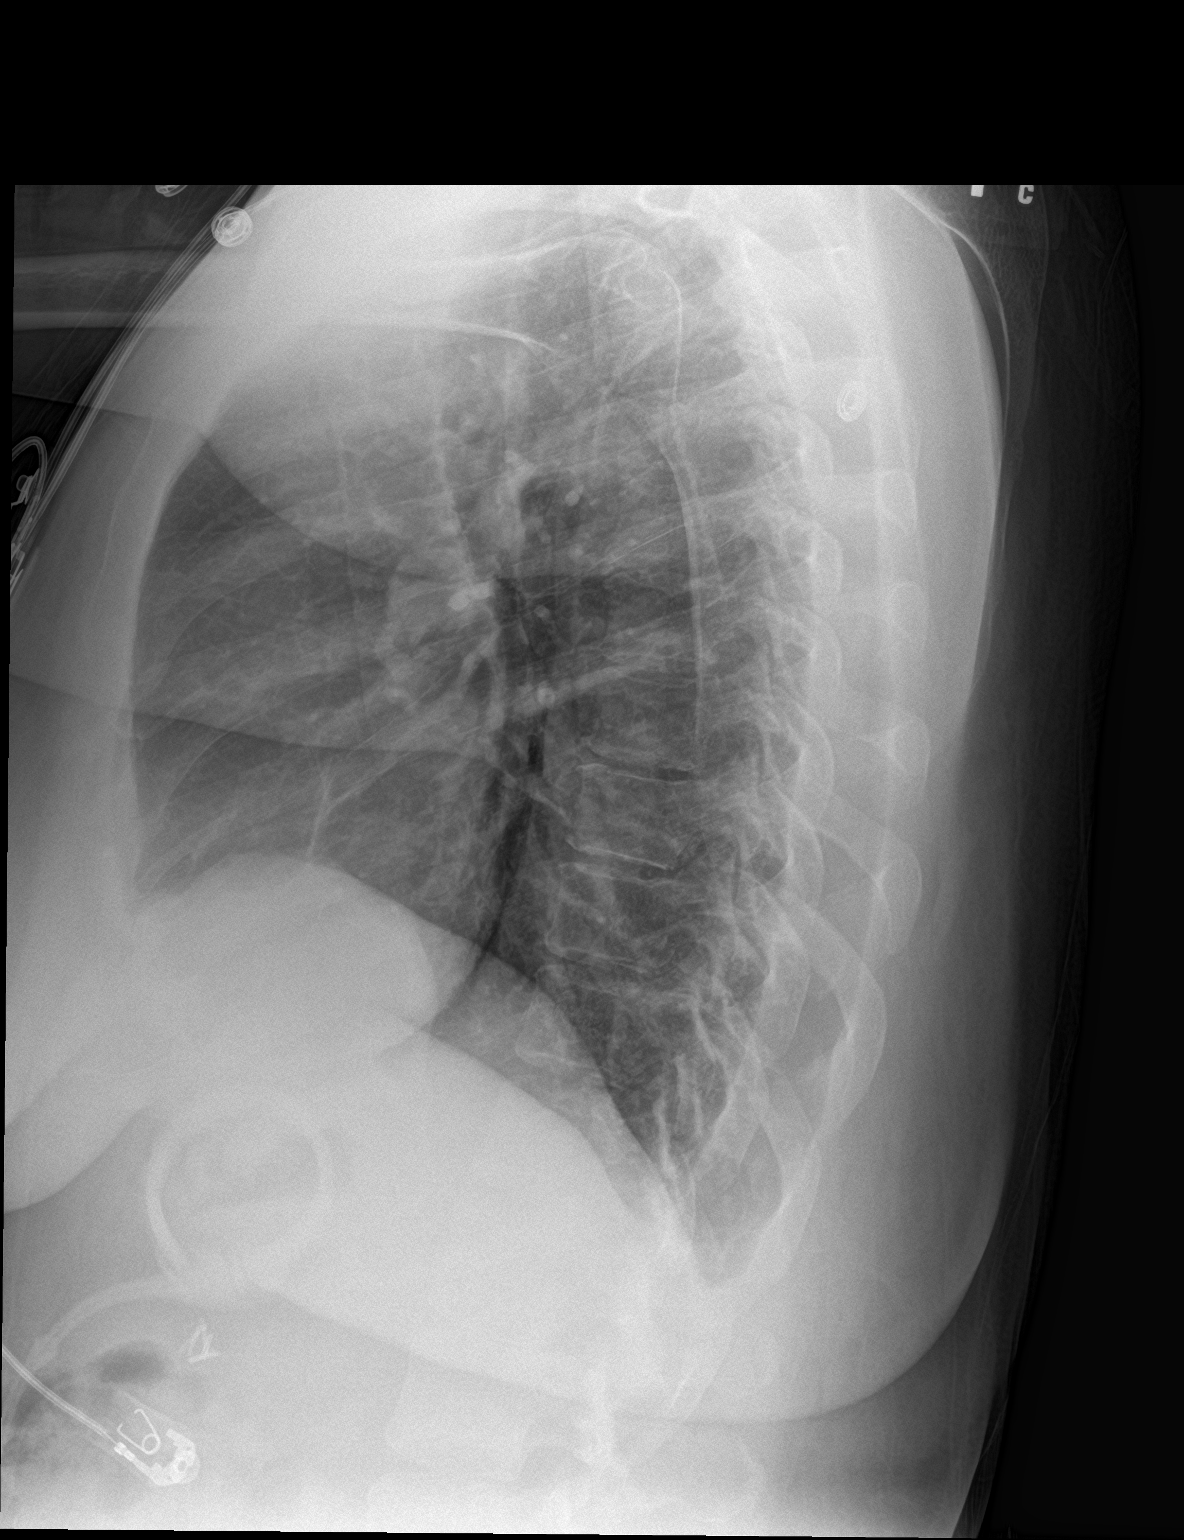

[2 of 2 positions shown; findings below may reference images not displayed]

FINDINGS: The heart size and mediastinal contours are within normal limits.
Normal pulmonary vascularity. No focal consolidation, pleural
effusion, or pneumothorax. No acute osseous abnormality. Gastric lap
band again noted in the upper abdomen.
IMPRESSION: No active cardiopulmonary disease.

## 2017-07-13 MED ORDER — GI COCKTAIL ~~LOC~~: 30.0000 mL | Freq: Once | ORAL | Status: DC

## 2017-07-13 MED ORDER — ACETAMINOPHEN 325 MG PO TABS
650.0000 mg | ORAL_TABLET | Freq: Once | ORAL | Status: AC
  Administered 2017-07-13: 650 mg via ORAL
  Filled 2017-07-13: qty 2

## 2017-07-13 NOTE — ED Triage Notes (Addendum)
From home via EMS - 0700 pt c/o central CP  radiating to L arm; increases on palp and w/ inhalation; pt describes pain as intermittent,, sharp; increased sob, per EMS,pt diagnosed w/ anxiety 2 weeks ago, prescribed Sertaline; pt diaphoretic upon EMS arrival, pt warm and dry at present  PTA pt given 324 asa, 0.4 nitro, pain from 5 to 3   BP 160/108 CBG 155 99%  4L (for comfort, does not wear normally)

## 2017-07-13 NOTE — ED Provider Notes (Signed)
[facility] Provider Note   CSN: [id number] Arrival date & time: 07/13/17  0747     History   Chief Complaint Chief Complaint  Patient presents with  . Chest Pain    HPI Jamie Mccarthy is a 42 y.o. female.  HPI   42 year old female with history of GERD, sickle cell trait, obesity brought here via EMS from home for evaluation of chest pain.  Patient report earlier this morning she was laying in bed when she developed acute onset of pain across her chest.  Described pain as a tightness sensation, 7 out of 10, radiates towards her left arm and up to the left side of face with associated shortness of breath, diaphoresis and nausea.  She has never had that kind of pain before.  When EMS arrived, patient receive aspirin and sublingual nitro and now her pain has since subsided.  The pain lasting for less than 30 minutes.  It was nonexertional.  No associated fever, chills, headache, lightheadedness, productive cough, hemoptysis, abdominal pain or rash.  She admits that she has been stressing out lately however did not feel stress this morning.  She does report family history of cardiac disease however patient is a non-smoker and occasional alcohol user.  No family history of premature cardiac death.  No prior history of PE or DVT, no recent surgery, prolonged bed rest, active cancer or hemoptysis.  She does not take oral hormone.  Past Medical History:  Diagnosis Date  . Anemia    Has sickle cell trait  . GERD (gastroesophageal reflux disease)    diet controlled  . Gestational diabetes   . H/O vaginal delivery 2010  . Headache(784.0)    Migraines  . Herpes 2007   hx of  . Hypertension    no current medications  . Iron deficiency anemia, unspecified 10/20/2012  . PONV (postoperative nausea and vomiting)    has had PONV with previous c/s and also lap band surgery 2004  . Seasonal allergies     Patient Active Problem List   Diagnosis Date Noted   . Trochanteric bursitis, right hip 03/06/2016  . Menorrhagia 10/12/2015  . Fibroid 10/12/2015  . S/P laparoscopic hysterectomy 10/12/2015  . Lumbosacral strain 08/18/2014  . Iron deficiency anemia, unspecified 10/20/2012  . H/O laparoscopic adjustable gastric banding 02/2003 10/02/2012  . Regurgitation 10/02/2012  . S/P repeat low transverse C-section 07/17/2012  . Sickle cell trait ([place]) 12/10/2011  . Essential hypertension 09/11/2010  . Allergic rhinitis 06/14/2010  . GERD 12/27/2008  . OBESITY NOS 12/03/2005  . 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 RHINITIS, ALLERGIC 10/30/2005    Past Surgical History:  Procedure Laterality Date  . BILATERAL SALPINGECTOMY  07/16/2012   Procedure: BILATERAL DISTAL SALPINGECTOMY;  Surgeon: Logan Bores, MD;  Location: [facility];  Service: Obstetrics;;  . CESAREAN SECTION  2012  . CESAREAN SECTION N/A 07/16/2012   Procedure: REPEAT CESAREAN SECTION;  Surgeon: Logan Bores, MD;  Location: [facility];  Service: Obstetrics;  Laterality: N/A;  . CHOLECYSTECTOMY N/A 11/14/2012   Procedure: LAPAROSCOPIC CHOLECYSTECTOMY;  Surgeon: Gayland Curry, MD;  Location: [facility];  Service: General;  Laterality: N/A;  . GASTRIC BANDING PORT REVISION N/A 11/14/2012   Procedure: GASTRIC BANDING PORT REVISION;  Surgeon: Gayland Curry, MD;  Location: [facility];  Service: General;  Laterality: N/A;  PORT PLACEMENT MOVED NEW MESH INSERTED   . LAPAROSCOPIC GASTRIC BANDING  2005  . LAPAROSCOPIC VAGINAL HYSTERECTOMY WITH SALPINGECTOMY Bilateral 10/12/2015  Procedure: HYSTERECTOMY TOTAL LAPAROSCOPIC BILATERAL SALPINGECTOMY;  Surgeon: Sherlyn Hay, DO;  Location: [facility];  Service: Gynecology;  Laterality: Bilateral;  . reposition gastric band  2006   x2  . TUBAL LIGATION  2014  . WISDOM TOOTH EXTRACTION  1999     OB History    Gravida  3   Para  3   Term  2   Preterm  1   AB  0   Living  4     SAB  0   TAB  0   Ectopic  0   Multiple  1   Live  Births  4            Home Medications    Prior to Admission medications   Medication Sig Start Date End Date Taking? Authorizing Provider  B-D UF III MINI PEN NEEDLES 31G X 5 MM MISC USE TO INJECT INTO SKIN DAILY AS DIRECTED 05/14/17   Hali Marry, MD  Iron-FA-B Cmp-C-Biot-Probiotic (FUSION PLUS) CAPS Take 1 capsule by mouth daily. 02/05/17   Hali Marry, MD  levocetirizine (XYZAL) 5 MG tablet Take 1 tablet (5 mg total) by mouth every evening. 05/02/17   Hali Marry, MD  Liraglutide -Weight Management (SAXENDA) 18 MG/3ML SOPN Inject 3 mg into the skin daily. 06/10/17   Hali Marry, MD  sertraline (ZOLOFT) 50 MG tablet 1/2 tab po QD x 6 days the increase to whole tab daily 07/02/17   Hali Marry, MD  SUMAtriptan (IMITREX) 100 MG tablet TAKE 1 TABLET BY MOUTH EVERY 2HRS AS NEEDED FOR MIGRAINE. (MAY REPEAT IN 2HRS IF HEADACHE PERSIST) 05/14/17   Hali Marry, MD  Tretinoin Microsphere (RETIN-A MICRO PUMP) 0.06 % GEL Apply 1 application topically at bedtime. 07/23/16   Hali Marry, MD    Family History Family History  Problem Relation Age of Onset  . Hypertension Mother   . Diabetes Unknown        father's family  . Thyroid cancer Unknown   . Breast cancer Maternal Grandmother     Social History Social History   Tobacco Use  . Smoking status: Never Smoker  . Smokeless tobacco: Never Used  Substance Use Topics  . Alcohol use: Yes    Comment: occ  . Drug use: No     Allergies   Latex and Naproxen   Review of Systems Review of Systems  All other systems reviewed and are negative.    Physical Exam Updated Vital Signs BP (!) 155/95   Pulse 63   Temp (!) 97.5 F (36.4 C) (Oral)   Resp 19   Ht 5\' 5"  (1.651 m)   Wt 80.3 kg (177 lb)   SpO2 100%   BMI 29.45 kg/m   Physical Exam  Constitutional: She appears well-developed and well-nourished. No distress.  HENT:  Head: Atraumatic.  Eyes: Conjunctivae  are normal.  Neck: Neck supple.  Cardiovascular: Normal rate, regular rhythm, intact distal pulses and normal pulses.  Pulmonary/Chest: Effort normal and breath sounds normal. She has no decreased breath sounds. She has no wheezes. She has no rales.  Abdominal: Soft. There is no tenderness.  Musculoskeletal: Normal range of motion.       Right lower leg: She exhibits no edema.       Left lower leg: She exhibits no edema.  Neurological: She is alert.  Skin: No rash noted.  Psychiatric: She has a normal mood and affect.  Nursing note and vitals  reviewed.    ED Treatments / Results  Labs (all labs ordered are listed, but only abnormal results are displayed) Labs Reviewed  BASIC METABOLIC PANEL - Abnormal; Notable for the following components:      Result Value   Glucose, Bld 108 (*)    Calcium 8.8 (*)    All other components within normal limits  CBC  I-STAT TROPONIN, ED  I-STAT BETA HCG BLOOD, ED ([facility], [facility], [facility] ONLY)  I-STAT TROPONIN, ED    EKG None   Date: 07/13/2017  Rate: 60  Rhythm: normal sinus rhythm  QRS Axis: normal  Intervals: normal  ST/T Wave abnormalities: normal  Conduction Disutrbances: none  Narrative Interpretation:   Old EKG Reviewed: No significant changes noted     Radiology Dg Chest 2 View  Result Date: 07/13/2017 CLINICAL DATA:  Chest pain and shortness of breath. EXAM: CHEST - 2 VIEW COMPARISON:  Chest x-ray dated September 19, 2013. FINDINGS: The heart size and mediastinal contours are within normal limits. Normal pulmonary vascularity. No focal consolidation, pleural effusion, or pneumothorax. No acute osseous abnormality. Gastric lap band again noted in the upper abdomen. IMPRESSION: No active cardiopulmonary disease. Electronically Signed   By: Titus Dubin M.D.   On: 07/13/2017 08:39    Procedures Procedures (including critical care time)  Medications Ordered in ED Medications  acetaminophen (TYLENOL) tablet 650 mg (650 mg Oral Given  07/13/17 0906)     Initial Impression / Assessment and Plan / ED Course  I have reviewed the triage vital signs and the nursing notes.  Pertinent labs & imaging results that were available during my care of the patient were reviewed by me and considered in my medical decision making (see chart for details).     BP (!) 155/95   Pulse 63   Temp (!) 97.5 F (36.4 C) (Oral)   Resp 19   Ht 5\' 5"  (1.651 m)   Wt 80.3 kg (177 lb)   SpO2 100%   BMI 29.45 kg/m    Final Clinical Impressions(s) / ED Diagnoses   Final diagnoses:  Atypical chest pain    ED Discharge Orders    None     8:25 AM Patient here with center chest pain that started approximately an hour ago but has since subsided.  Her HEART score is 2 which puts her at a low risk of MACE.  Her symptom has since resolved.  Will obtain delta troponin.  She is PERC negative, low suspicion for PE.  11:53 AM Patient remains symptom-free.  Negative doctors troponin.  Chest x-ray unremarkable, labs are reassuring, pregnancy test is negative, EKG without acute ischemic changes.  Encourage patient to follow-up with her PCP for recheck of her elevated blood pressure and her symptoms.  Return precautions discussed.   Domenic Moras, PA-C 07/13/17 1202    Pattricia Boss, MD 07/15/17 [phone]

## 2017-07-13 NOTE — ED Notes (Signed)
Pt returned from xray

## 2017-07-13 NOTE — ED Notes (Signed)
Patient transported to X-ray 

## 2017-07-16 ENCOUNTER — Ambulatory Visit (INDEPENDENT_AMBULATORY_CARE_PROVIDER_SITE_OTHER): Payer: 59 | Admitting: Family Medicine

## 2017-07-16 ENCOUNTER — Encounter: Payer: Self-pay | Admitting: Family Medicine

## 2017-07-16 VITALS — BP 138/84 | HR 70 | Ht 65.0 in | Wt 181.0 lb

## 2017-07-16 DIAGNOSIS — F32A Depression, unspecified: Secondary | ICD-10-CM

## 2017-07-16 DIAGNOSIS — F419 Anxiety disorder, unspecified: Secondary | ICD-10-CM

## 2017-07-16 DIAGNOSIS — F329 Major depressive disorder, single episode, unspecified: Secondary | ICD-10-CM | POA: Diagnosis not present

## 2017-07-16 MED ORDER — OMEPRAZOLE 20 MG PO CPDR: 20.0000 mg | DELAYED_RELEASE_CAPSULE | Freq: Every day | ORAL | 1 refills | Status: DC

## 2017-07-16 NOTE — Progress Notes (Signed)
Subjective:    Patient ID: Jamie Mccarthy, female    DOB: Sep 30, 1975, 42 y.o.   MRN: [id number]  HPI  42 yo female c/o of anxiety.  She says over the last several months it just seems to be building.  She has 3 children at home.  Her husband works the night shift and so he really does not contribute a lot to the home life and is not always very supportive.  She says her daughter was recently diagnosed with type 1 diabetes and this is just been a lot of stress on her.  She also works full-time.  She feels like she is going to have a mental break.  Says she can just drop cry at the drop of hat.  She still enjoys her work.  She is not resting well.  She feels down and sad and depressed but no thoughts of wanting to harm herself.  She said it she did go see a counselor who recommended that she follow-up with her doctor and discuss what is going on.  She has gone back to see counselor.  She has been on full tab of sertarline for one week.  She still just not feeling like she is emotionally up to going back to work yet.  She does really enjoy work.  She has been back to see her counselor.  She also recently started experiencing some chest pain.  She has a history of GERD and sickle cell trait as well as gastric banding.  She went to the emergency department on June 22.  She described it as a tightness sensation that started more on the right lower side of the chest and then radiated upward to her left upper chest the left side of her face and left arm started tingling.  Cardiac markers were negative.  EKG was normal.  S x-ray was unremarkable.  Negative pregnancy test.  Labs are reassuring.  Her blood pressure was mildly elevated when she arrived.  She was discharged home.  She said she did have some recurrence of the chest pain the next day and then again yesterday.  She has had a history of gallbladder removal.   Review of Systems  BP 138/84   Pulse 70   Ht 5\' 5"  (1.651 m)   Wt 181 lb (82.1 kg)   BMI  30.12 kg/m     Allergies  Allergen Reactions  . Latex Itching    Pt states she gets severely itchy with latex  . Naproxen Rash    Past Medical History:  Diagnosis Date  . Anemia    Has sickle cell trait  . GERD (gastroesophageal reflux disease)    diet controlled  . Gestational diabetes   . H/O vaginal delivery 2010  . Headache(784.0)    Migraines  . Herpes 2007   hx of  . Hypertension    no current medications  . Iron deficiency anemia, unspecified 10/20/2012  . PONV (postoperative nausea and vomiting)    has had PONV with previous c/s and also lap band surgery 2004  . Seasonal allergies     Past Surgical History:  Procedure Laterality Date  . BILATERAL SALPINGECTOMY  07/16/2012   Procedure: BILATERAL DISTAL SALPINGECTOMY;  Surgeon: Logan Bores, MD;  Location: [facility];  Service: Obstetrics;;  . CESAREAN SECTION  2012  . CESAREAN SECTION N/A 07/16/2012   Procedure: REPEAT CESAREAN SECTION;  Surgeon: Logan Bores, MD;  Location: [facility];  Service: Obstetrics;  Laterality: N/A;  .  CHOLECYSTECTOMY N/A 11/14/2012   Procedure: LAPAROSCOPIC CHOLECYSTECTOMY;  Surgeon: Gayland Curry, MD;  Location: [facility];  Service: General;  Laterality: N/A;  . GASTRIC BANDING PORT REVISION N/A 11/14/2012   Procedure: GASTRIC BANDING PORT REVISION;  Surgeon: Gayland Curry, MD;  Location: [facility];  Service: General;  Laterality: N/A;  PORT PLACEMENT MOVED NEW MESH INSERTED   . LAPAROSCOPIC GASTRIC BANDING  2005  . LAPAROSCOPIC VAGINAL HYSTERECTOMY WITH SALPINGECTOMY Bilateral 10/12/2015   Procedure: HYSTERECTOMY TOTAL LAPAROSCOPIC BILATERAL SALPINGECTOMY;  Surgeon: Sherlyn Hay, DO;  Location: [facility];  Service: Gynecology;  Laterality: Bilateral;  . reposition gastric band  2006   x2  . TUBAL LIGATION  2014  . WISDOM TOOTH EXTRACTION  1999    Social History   Socioeconomic History  . Marital status: Married    Spouse name: Not on file  . Number of children: 1  . Years of  education: Not on file  . Highest education level: Not on file  Occupational History  . Occupation: Theatre manager: [employer]  . Financial resource strain: Not on file  . Food insecurity:    Worry: Not on file    Inability: Not on file  . Transportation needs:    Medical: Not on file    Non-medical: Not on file  Tobacco Use  . Smoking status: Never Smoker  . Smokeless tobacco: Never Used  Substance and Sexual Activity  . Alcohol use: Yes    Comment: occ  . Drug use: No  . Sexual activity: Yes    Comment: separated, 2 caffeine drinks daily.   Lifestyle  . Physical activity:    Days per week: Not on file    Minutes per session: Not on file  . Stress: Not on file  Relationships  . Social connections:    Talks on phone: Not on file    Gets together: Not on file    Attends religious service: Not on file    Active member of club or organization: Not on file    Attends meetings of clubs or organizations: Not on file    Relationship status: Not on file  . Intimate partner violence:    Fear of current or ex partner: Not on file    Emotionally abused: Not on file    Physically abused: Not on file    Forced sexual activity: Not on file  Other Topics Concern  . Not on file  Social History Narrative   2 caffeine drinks per day    Family History  Problem Relation Age of Onset  . Hypertension Mother   . Diabetes Unknown        father's family  . Thyroid cancer Unknown   . Breast cancer Maternal Grandmother     Outpatient Encounter Medications as of 07/16/2017  Medication Sig  . B-D UF III MINI PEN NEEDLES 31G X 5 MM MISC USE TO INJECT INTO SKIN DAILY AS DIRECTED  . Iron-FA-B Cmp-C-Biot-Probiotic (FUSION PLUS) CAPS Take 1 capsule by mouth daily.  Marland Kitchen levocetirizine (XYZAL) 5 MG tablet Take 1 tablet (5 mg total) by mouth every evening.  . Liraglutide -Weight Management (SAXENDA) 18 MG/3ML SOPN Inject 3 mg into the skin daily.  . sertraline  (ZOLOFT) 50 MG tablet 1/2 tab po QD x 6 days the increase to whole tab daily  . SUMAtriptan (IMITREX) 100 MG tablet TAKE 1 TABLET BY MOUTH EVERY 2HRS AS NEEDED FOR MIGRAINE. (MAY REPEAT  IN 2HRS IF HEADACHE PERSIST)  . Tretinoin Microsphere (RETIN-A MICRO PUMP) 0.06 % GEL Apply 1 application topically at bedtime.  Marland Kitchen omeprazole (PRILOSEC) 20 MG capsule Take 1 capsule (20 mg total) by mouth daily.   No facility-administered encounter medications on file as of 07/16/2017.          Objective:   Physical Exam  Constitutional: She is oriented to person, place, and time. She appears well-developed and well-nourished.  HENT:  Head: Normocephalic and atraumatic.  Cardiovascular: Normal rate, regular rhythm and normal heart sounds.  Pulmonary/Chest: Effort normal and breath sounds normal.  Abdominal: Soft. Bowel sounds are normal. She exhibits no distension and no mass. There is tenderness. There is no rebound and no guarding. No hernia.  Tenderness in the RUQ. I am able to palpate her port.    Neurological: She is alert and oriented to person, place, and time.  Skin: Skin is warm and dry.  Psychiatric: She has a normal mood and affect. Her behavior is normal.       Assessment & Plan:  Anxiety with depression -stable.  PHQ 9 score of 18, previous score of 22.  Current gad 7 score of 18 and previous score of 20.  It is her symptoms is very difficult.  No significant improvement from when I last saw her.  She just increased her sertraline a week ago to a whole tab.  We will continue with current dosing regimen and I will see her back in 3 weeks.  Hopefully she will be improved.  Encouraged her to keep her follow-up appointments with her therapist/counselor.  I will write her out for an additional 2 weeks.  She is considering the possibility of at some point going part-time may be next year if financially she and her family can afford it.  Atypical chest pain-cardiac disease was ruled out.  Discussed  options.  Would like trying to put her on a PPI since she does have a history of GERD.  She is also been getting a burning sensation in her right upper abdomen.  History of cholecystectomy.  If not improving then please let me know.

## 2017-07-24 ENCOUNTER — Telehealth: Payer: Self-pay

## 2017-07-24 ENCOUNTER — Other Ambulatory Visit: Payer: Self-pay | Admitting: Family Medicine

## 2017-07-24 NOTE — Telephone Encounter (Signed)
lvm informing pt that forms have been completed and faxed.Maryruth Eve, [name], CMA

## 2017-07-24 NOTE — Telephone Encounter (Signed)
FMLA form completed, faxed,scanned, confirmation received.Elouise Munroe, [place]

## 2017-07-24 NOTE — Telephone Encounter (Signed)
Zyona called about the FMLA extension paperwork. Please advise.

## 2017-07-29 NOTE — Telephone Encounter (Addendum)
Patient called and left a message stating she can return to work with no restrictions.   Fax # [phone] [place] [phone]

## 2017-07-29 NOTE — Telephone Encounter (Signed)
Called pt and lvm asking for clarification about returning to work. I looked over her fmla form and did not notice anything about her not being able to return w/o any restrictions. Jamie Mccarthy, [name], CMA

## 2017-07-30 ENCOUNTER — Encounter: Payer: Self-pay | Admitting: *Deleted

## 2017-07-30 NOTE — Telephone Encounter (Addendum)
Pt will need a return to work note stating that she can go back to work with no restrictions faxed .Elouise Munroe, CMA   Letter faxed, confirmation received.Elouise Munroe, [place]

## 2017-08-06 ENCOUNTER — Other Ambulatory Visit: Payer: Self-pay | Admitting: *Deleted

## 2017-08-06 ENCOUNTER — Encounter: Payer: Self-pay | Admitting: Family Medicine

## 2017-08-06 ENCOUNTER — Ambulatory Visit (INDEPENDENT_AMBULATORY_CARE_PROVIDER_SITE_OTHER): Payer: 59 | Admitting: Family Medicine

## 2017-08-06 VITALS — BP 137/80 | HR 72 | Ht 65.0 in | Wt 188.0 lb

## 2017-08-06 DIAGNOSIS — H00012 Hordeolum externum right lower eyelid: Secondary | ICD-10-CM | POA: Diagnosis not present

## 2017-08-06 DIAGNOSIS — F32A Depression, unspecified: Secondary | ICD-10-CM

## 2017-08-06 DIAGNOSIS — R232 Flushing: Secondary | ICD-10-CM

## 2017-08-06 DIAGNOSIS — F419 Anxiety disorder, unspecified: Secondary | ICD-10-CM | POA: Diagnosis not present

## 2017-08-06 DIAGNOSIS — R635 Abnormal weight gain: Secondary | ICD-10-CM | POA: Diagnosis not present

## 2017-08-06 DIAGNOSIS — Z6831 Body mass index (BMI) 31.0-31.9, adult: Secondary | ICD-10-CM

## 2017-08-06 DIAGNOSIS — F329 Major depressive disorder, single episode, unspecified: Secondary | ICD-10-CM

## 2017-08-06 MED ORDER — SERTRALINE HCL 50 MG PO TABS: 75.0000 mg | ORAL_TABLET | Freq: Every day | ORAL | 0 refills | Status: DC

## 2017-08-06 MED ORDER — INSULIN PEN NEEDLE 32G X 4 MM MISC: 2 refills | Status: DC

## 2017-08-06 MED ORDER — LIRAGLUTIDE -WEIGHT MANAGEMENT 18 MG/3ML ~~LOC~~ SOPN: 3.0000 mg | PEN_INJECTOR | Freq: Every day | SUBCUTANEOUS | 2 refills | Status: DC

## 2017-08-06 MED ORDER — SUMATRIPTAN SUCCINATE 100 MG PO TABS
ORAL_TABLET | ORAL | 3 refills | Status: DC
Start: 2017-08-06 — End: 2018-12-03

## 2017-08-06 NOTE — Progress Notes (Signed)
Subjective:    Patient ID: Jamie Mccarthy, female    DOB: [birth year], 42 y.o.   MRN: [id number]  HPI 42 year old female is here today to follow-up for anxiety.  We started her on sertraline about 4 weeks ago.  She is here for 3 week f/u.   She is actually doing a little better overall.  She is been back at work for a week and going into her second week and has been doing okay overall.  She has been taking her sertraline consistently sometimes she has a hard time remembering to take it but says she is been doing great with this 1.  She really has not had any negative side effects.  She has gained some weight but feels like that is related to them taking extra saline out of her port for her banding about a month ago.  She is gained about 20 pounds since then.  She does have a follow-up appointment with her therapist on Friday.  She has been experiencing some hot flashes mostly during the daytime intermittently over the last month.  Is gotten worse over the last couple of weeks.  She has had a hysterectomy so does not have regular menstrual cycles and wonders if she could be going through menopause prematurely.  Abnormal weight gain/would like to have her Saxenda refilled.  She also wanted to know if she could switch to the Nano needles as well.  She also complains of a stye on her right lower eyelid.  She said it started feeling tender on Friday approximately 5 days ago and then yesterday noticed an actual bump.  She said she has not been wearing eye make-up since then.  Is never had this before.  Worsening or alleviating factors.   Review of Systems  BP 137/80   Pulse 72   Ht 5\' 5"  (1.651 m)   Wt 188 lb (85.3 kg)   SpO2 100%   BMI 31.28 kg/m     Allergies  Allergen Reactions  . Latex Itching    Pt states she gets severely itchy with latex  . Naproxen Rash    Past Medical History:  Diagnosis Date  . Anemia    Has sickle cell trait  . GERD (gastroesophageal reflux disease)    diet  controlled  . Gestational diabetes   . H/O vaginal delivery 2010  . Headache(784.0)    Migraines  . Herpes 2007   hx of  . Hypertension    no current medications  . Iron deficiency anemia, unspecified 10/20/2012  . PONV (postoperative nausea and vomiting)    has had PONV with previous c/s and also lap band surgery 2004  . Seasonal allergies     Past Surgical History:  Procedure Laterality Date  . BILATERAL SALPINGECTOMY  07/16/2012   Procedure: BILATERAL DISTAL SALPINGECTOMY;  Surgeon: Logan Bores, MD;  Location: [facility];  Service: Obstetrics;;  . CESAREAN SECTION  2012  . CESAREAN SECTION N/A 07/16/2012   Procedure: REPEAT CESAREAN SECTION;  Surgeon: Logan Bores, MD;  Location: [facility];  Service: Obstetrics;  Laterality: N/A;  . CHOLECYSTECTOMY N/A 11/14/2012   Procedure: LAPAROSCOPIC CHOLECYSTECTOMY;  Surgeon: Gayland Curry, MD;  Location: [facility];  Service: General;  Laterality: N/A;  . GASTRIC BANDING PORT REVISION N/A 11/14/2012   Procedure: GASTRIC BANDING PORT REVISION;  Surgeon: Gayland Curry, MD;  Location: [facility];  Service: General;  Laterality: N/A;  PORT PLACEMENT MOVED NEW MESH INSERTED   . LAPAROSCOPIC  GASTRIC BANDING  2005  . LAPAROSCOPIC VAGINAL HYSTERECTOMY WITH SALPINGECTOMY Bilateral 10/12/2015   Procedure: HYSTERECTOMY TOTAL LAPAROSCOPIC BILATERAL SALPINGECTOMY;  Surgeon: Sherlyn Hay, DO;  Location: [facility];  Service: Gynecology;  Laterality: Bilateral;  . reposition gastric band  2006   x2  . TUBAL LIGATION  2014  . WISDOM TOOTH EXTRACTION  1999    Social History   Socioeconomic History  . Marital status: Married    Spouse name: Not on file  . Number of children: 1  . Years of education: Not on file  . Highest education level: Not on file  Occupational History  . Occupation: Theatre manager: [employer]  . Financial resource strain: Not on file  . Food insecurity:    Worry: Not on file    Inability: Not on  file  . Transportation needs:    Medical: Not on file    Non-medical: Not on file  Tobacco Use  . Smoking status: Never Smoker  . Smokeless tobacco: Never Used  Substance and Sexual Activity  . Alcohol use: Yes    Comment: occ  . Drug use: No  . Sexual activity: Yes    Comment: separated, 2 caffeine drinks daily.   Lifestyle  . Physical activity:    Days per week: Not on file    Minutes per session: Not on file  . Stress: Not on file  Relationships  . Social connections:    Talks on phone: Not on file    Gets together: Not on file    Attends religious service: Not on file    Active member of club or organization: Not on file    Attends meetings of clubs or organizations: Not on file    Relationship status: Not on file  . Intimate partner violence:    Fear of current or ex partner: Not on file    Emotionally abused: Not on file    Physically abused: Not on file    Forced sexual activity: Not on file  Other Topics Concern  . Not on file  Social History Narrative   2 caffeine drinks per day    Family History  Problem Relation Age of Onset  . Hypertension Mother   . Diabetes Unknown        father's family  . Thyroid cancer Unknown   . Breast cancer Maternal Grandmother     Outpatient Encounter Medications as of 08/06/2017  Medication Sig  . B-D UF III MINI PEN NEEDLES 31G X 5 MM MISC USE TO INJECT INTO SKIN DAILY AS DIRECTED  . Iron-FA-B Cmp-C-Biot-Probiotic (FUSION PLUS) CAPS Take 1 capsule by mouth daily.  Marland Kitchen levocetirizine (XYZAL) 5 MG tablet Take 1 tablet (5 mg total) by mouth every evening.  . Liraglutide -Weight Management (SAXENDA) 18 MG/3ML SOPN Inject 3 mg into the skin daily.  Marland Kitchen omeprazole (PRILOSEC) 20 MG capsule Take 1 capsule (20 mg total) by mouth daily.  . sertraline (ZOLOFT) 50 MG tablet Take 1.5 tablets (75 mg total) by mouth daily.  . SUMAtriptan (IMITREX) 100 MG tablet TAKE 1 TABLET BY MOUTH EVERY 2HRS AS NEEDED FOR MIGRAINE. (MAY REPEAT IN 2HRS IF  HEADACHE PERSIST)  . Tretinoin Microsphere (RETIN-A MICRO PUMP) 0.06 % GEL Apply 1 application topically at bedtime.  . [DISCONTINUED] Liraglutide -Weight Management (SAXENDA) 18 MG/3ML SOPN Inject 3 mg into the skin daily.  . [DISCONTINUED] sertraline (ZOLOFT) 50 MG tablet Take 1 tablet (50 mg total) by mouth  daily.  . [DISCONTINUED] SUMAtriptan (IMITREX) 100 MG tablet TAKE 1 TABLET BY MOUTH EVERY 2HRS AS NEEDED FOR MIGRAINE. (MAY REPEAT IN 2HRS IF HEADACHE PERSIST)   No facility-administered encounter medications on file as of 08/06/2017.          Objective:   Physical Exam  Constitutional: She is oriented to person, place, and time. She appears well-developed and well-nourished.  HENT:  Head: Normocephalic and atraumatic.  She does have a swollen erythematous area over the right mid lower lid.  No active drainage.  Cardiovascular: Normal rate, regular rhythm and normal heart sounds.  Pulmonary/Chest: Effort normal and breath sounds normal.  Neurological: She is alert and oriented to person, place, and time.  Skin: Skin is warm and dry.  Psychiatric: She has a normal mood and affect. Her behavior is normal.       Assessment & Plan:  Anxiety/Depression -PHQ 9 score of 15 which is down a little from previous of 18.  Gad 7 score of 10 which is down from previous of 18.  She has had some significant progress but I would like to adjust her Zoloft.  We discussed increasing to 75 mg.  She did not feel quite comfortable going up to 100 at this point.  Follow-up in 6 weeks.  Keep follow-up appointment with therapist.  Hot flashes-we will check labs just to rule out premature menopause but I suspect is likely a side effect of the Zoloft and we discussed that today as well.  Stye on her right eye lower lid-discussed treatment with warm compresses gentle massage.  She is Artie been avoiding wearing make-up.  Also will give her a prescription for erythromycin ophthalmic ointment.  Abnormal  weight gain/BMI 31-refilled Saxenda

## 2017-08-06 NOTE — Patient Instructions (Signed)
Increased her sertraline to 1-1/2 tabs daily.

## 2017-08-15 ENCOUNTER — Ambulatory Visit
Admission: RE | Admit: 2017-08-15 | Discharge: 2017-08-15 | Disposition: A | Discharge status: Home / Self Care | Payer: 59 | Source: Ambulatory Visit | Attending: Obstetrics and Gynecology | Admitting: Obstetrics and Gynecology

## 2017-08-15 ENCOUNTER — Other Ambulatory Visit: Payer: Self-pay | Admitting: Family Medicine

## 2017-08-15 ENCOUNTER — Other Ambulatory Visit: Payer: Self-pay | Admitting: Obstetrics and Gynecology

## 2017-08-15 DIAGNOSIS — Z1231 Encounter for screening mammogram for malignant neoplasm of breast: Secondary | ICD-10-CM | POA: Diagnosis not present

## 2017-08-15 IMAGING — MG DIGITAL SCREENING BILATERAL MAMMOGRAM WITH TOMO AND CAD
8 series · 9 of 24 positions shown · non-contrast
Comparison: None.

CLINICAL DATA: Screening.

EXAM:
DIGITAL SCREENING BILATERAL MAMMOGRAM WITH TOMO AND CAD

[R CC synth-2D]
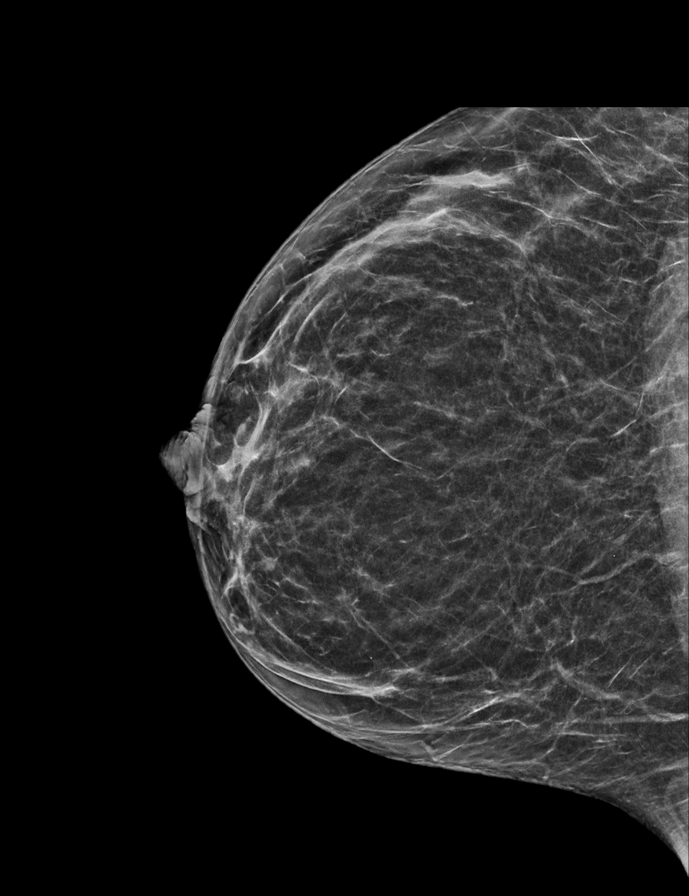

[L CC synth-2D]
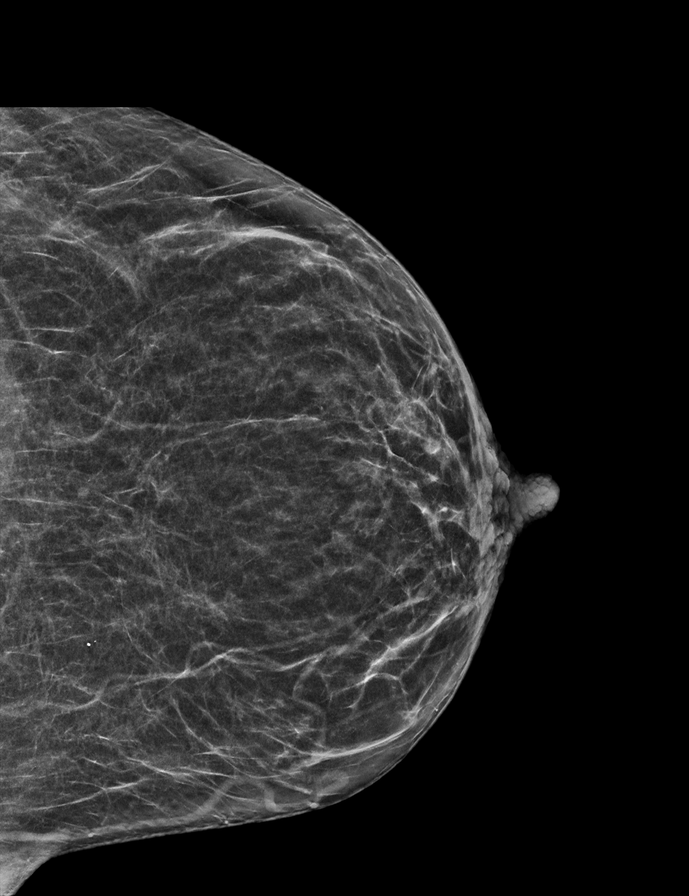

[R MLO synth-2D]
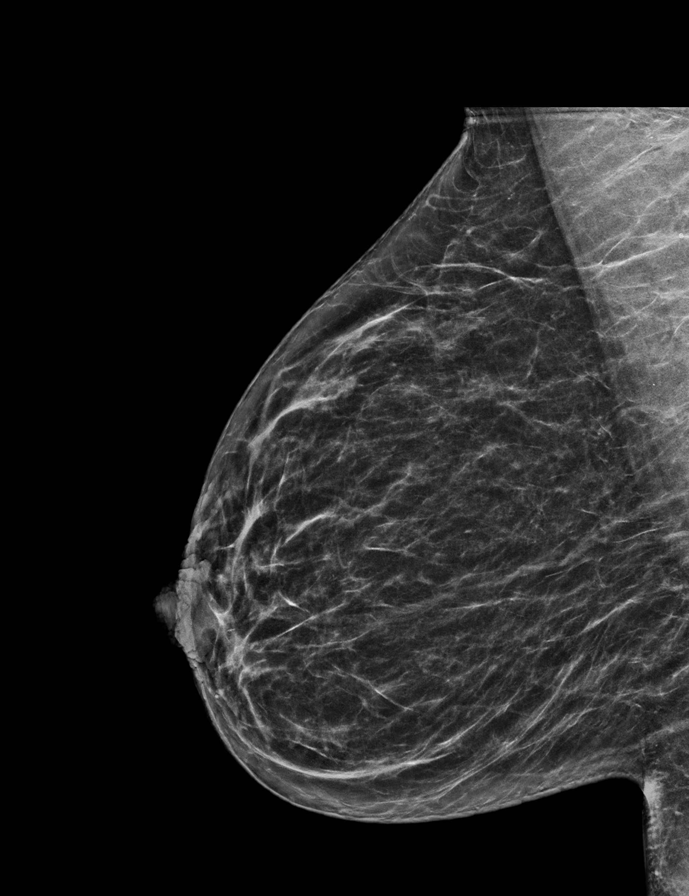

[L MLO synth-2D]
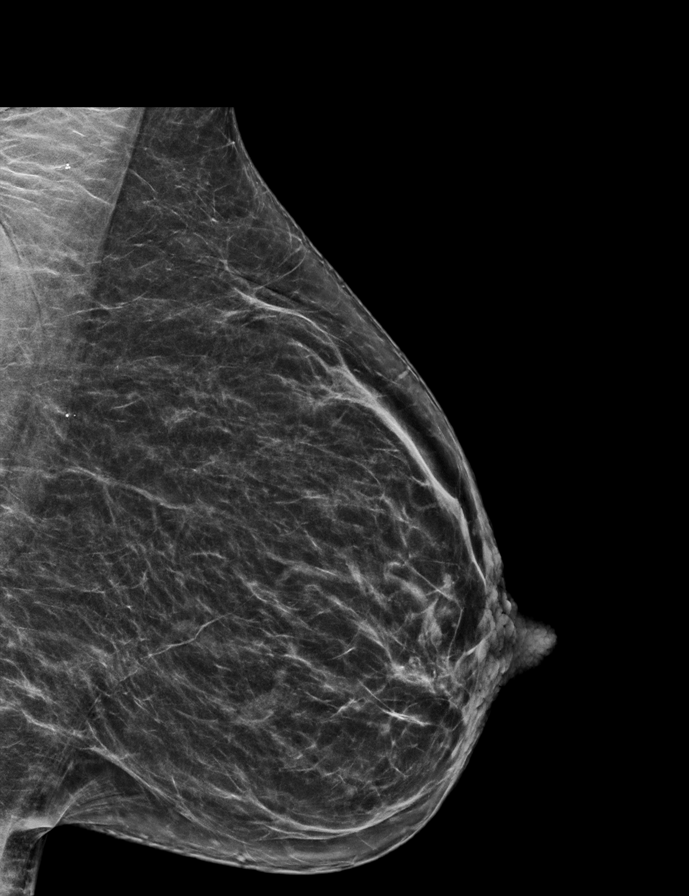

[L CC tomo · 2 of 56 frames shown]
[frame 19/56]
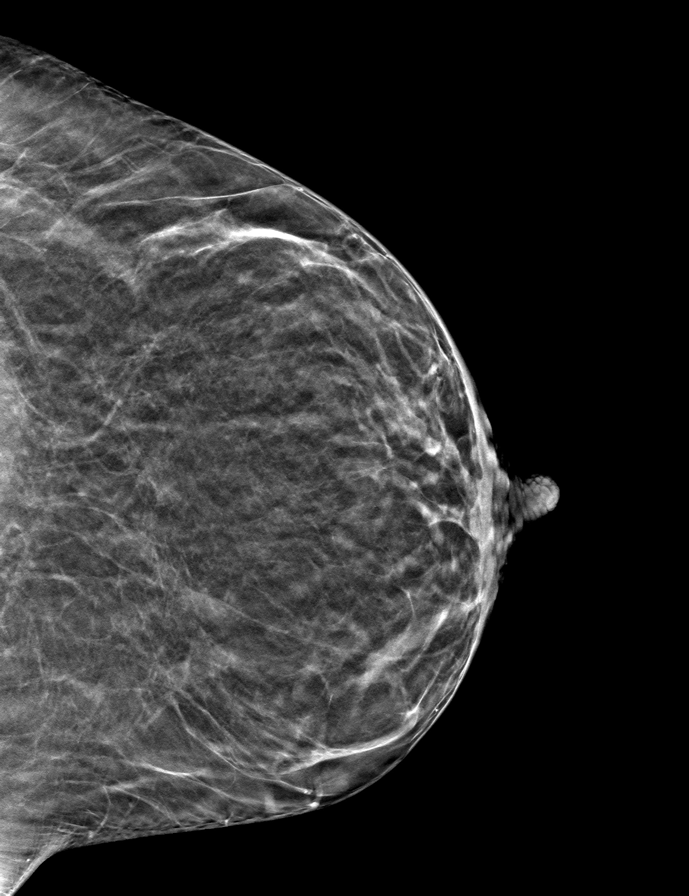
[frame 29/56]
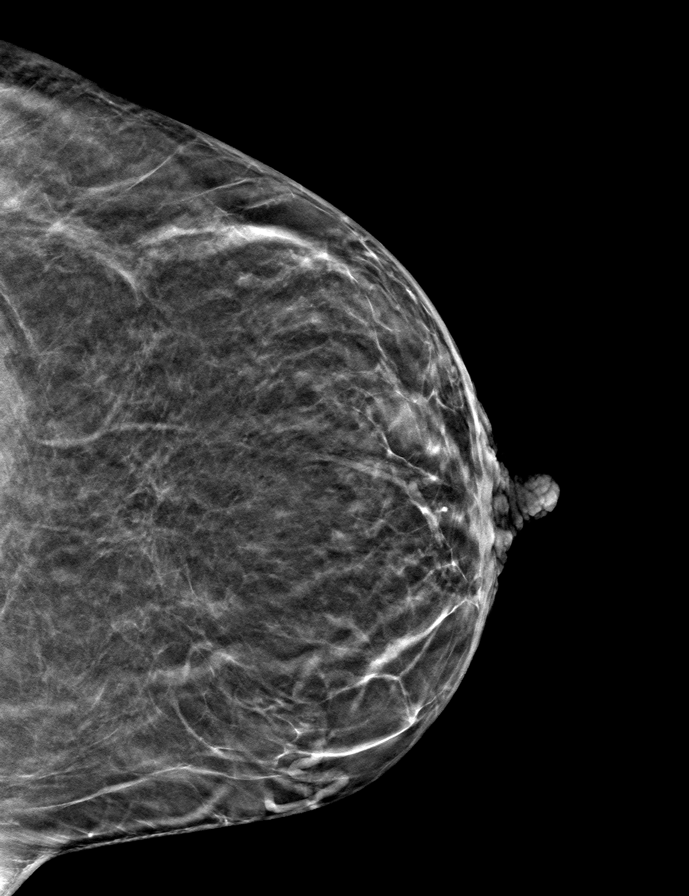

[L MLO tomo · tomo slice 32/63.0]
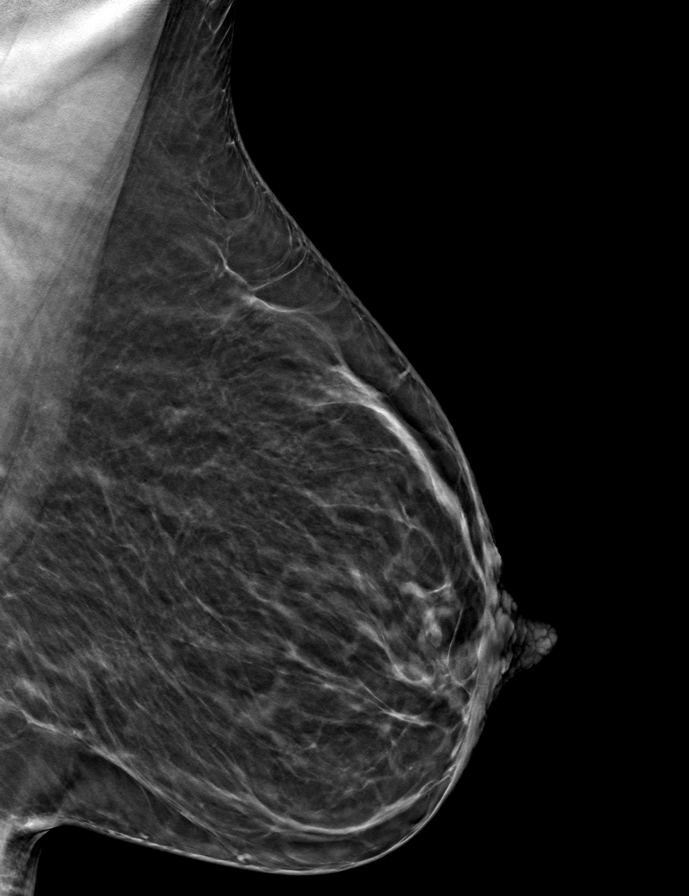

[R CC tomo · tomo slice 27/54.0]
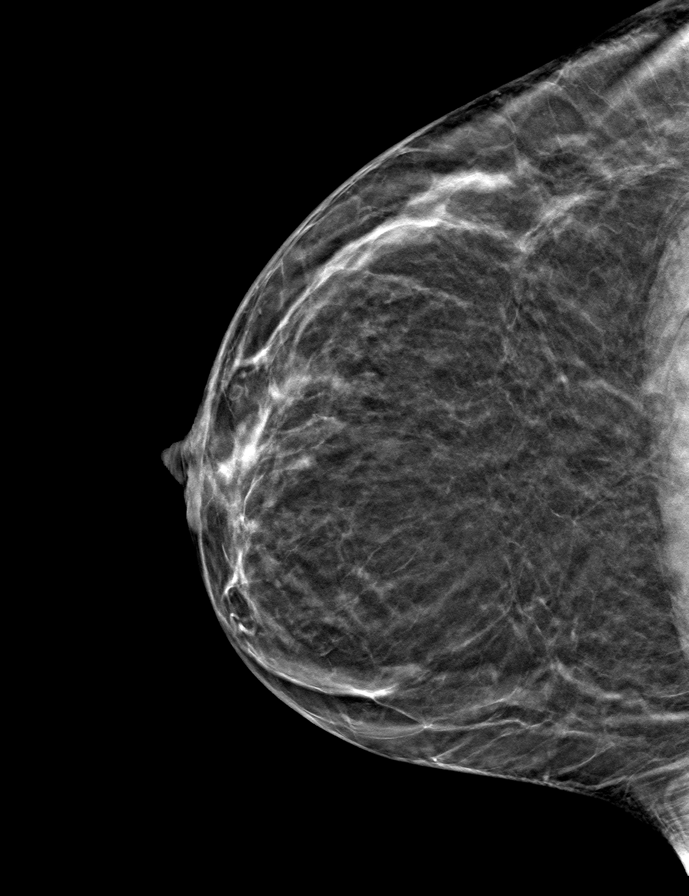

[R MLO tomo · tomo slice 30/59.0]
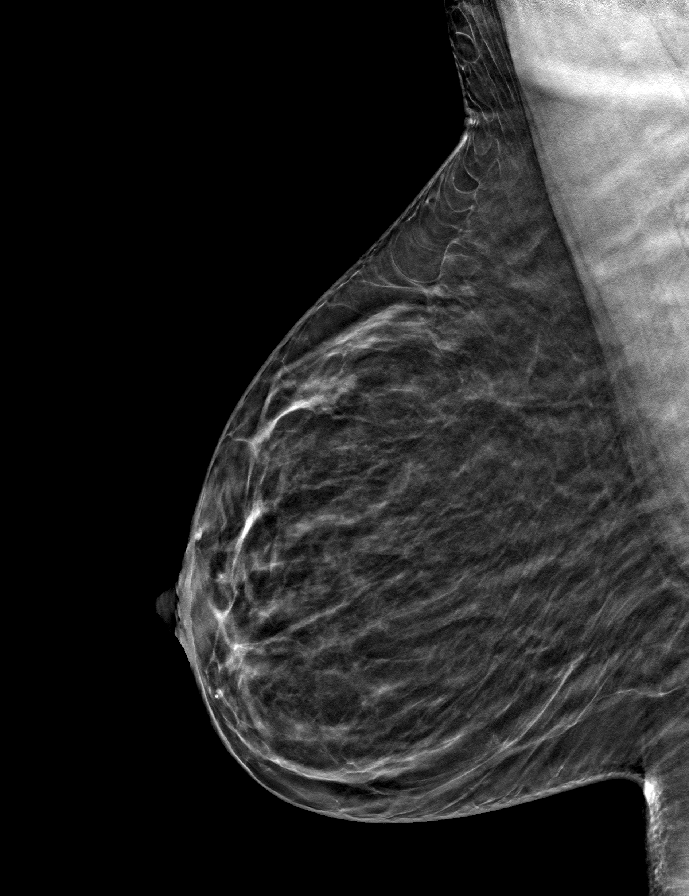

[9 of 24 positions shown; findings below may reference images not displayed]

ACR Breast Density Category b: There are scattered areas of
fibroglandular density.
FINDINGS: There are no findings suspicious for malignancy. Images were
processed with CAD.
IMPRESSION: No mammographic evidence of malignancy. A result letter of this
screening mammogram will be mailed directly to the patient.

RECOMMENDATION:
Screening mammogram in one year. (Code:[1T])

BI-RADS CATEGORY  1: Negative.

## 2017-09-02 ENCOUNTER — Other Ambulatory Visit: Payer: Self-pay | Admitting: Family Medicine

## 2017-09-20 ENCOUNTER — Encounter: Payer: Self-pay | Admitting: Family Medicine

## 2017-09-20 ENCOUNTER — Ambulatory Visit (INDEPENDENT_AMBULATORY_CARE_PROVIDER_SITE_OTHER): Payer: 59 | Admitting: Family Medicine

## 2017-09-20 VITALS — BP 139/85 | HR 71 | Ht 67.0 in | Wt 207.0 lb

## 2017-09-20 DIAGNOSIS — M549 Dorsalgia, unspecified: Secondary | ICD-10-CM

## 2017-09-20 DIAGNOSIS — S39012A Strain of muscle, fascia and tendon of lower back, initial encounter: Secondary | ICD-10-CM | POA: Diagnosis not present

## 2017-09-20 LAB — POCT URINALYSIS DIPSTICK
BILIRUBIN UA: NEGATIVE
Blood, UA: NEGATIVE
Glucose, UA: NEGATIVE
KETONES UA: NEGATIVE
Leukocytes, UA: NEGATIVE
NITRITE UA: NEGATIVE
PROTEIN UA: NEGATIVE
SPEC GRAV UA: 1.015 (ref 1.010–1.025)
Urobilinogen, UA: 0.2 E.U./dL
pH, UA: 7.5 (ref 5.0–8.0)

## 2017-09-20 MED ORDER — CYCLOBENZAPRINE HCL 10 MG PO TABS: 10.0000 mg | ORAL_TABLET | Freq: Three times a day (TID) | ORAL | 0 refills | Status: DC | PRN

## 2017-09-20 NOTE — Progress Notes (Signed)
Jamie Mccarthy is a 42 y.o. female who presents to Jamie Mccarthy: Jamie Mccarthy today for back and flank pain. Jamie Mccarthy notes a 2-day history of pain in the right low back radiating to the right groin.  She denies any urinary symptoms fevers or chills nausea vomiting or diarrhea.  She is status post hysterectomy but still has her ovaries.  She denies any vaginal discharge or bleeding.  She notes the pain is somewhat consistent with ovarian cyst pain previously felt.  She notes that it has been several days since her last bowel movement which is typical for her.  She cannot think of any injury or change in behavior that may have caused back or groin pain.   ROS as above:  Exam:  BP 139/85   Pulse 71   Ht 5\' 7"  (1.702 m)   Wt 207 lb (93.9 kg)   LMP 08/29/2015 (Approximate)   BMI 32.42 kg/m  Wt Readings from Last 5 Encounters:  09/20/17 207 lb (93.9 kg)  08/06/17 188 lb (85.3 kg)  07/16/17 181 lb (82.1 kg)  07/13/17 177 lb (80.3 kg)  07/02/17 177 lb (80.3 kg)    Gen: Well NAD HEENT: EOMI,  MMM Lungs: Normal work of breathing. CTABL Heart: RRR no MRG Abd: NABS, Soft. Nondistended, mildly tender to palpation right lower quadrant with no rebound or guarding.  No masses palpated.  No bulging or herniation present.  Pain is worse with contracture of abdominal musculature. Exts: Brisk capillary refill, warm and well perfused.  MSK: Nontender to spinal midline.  Tender palpation right lumbar paraspinal muscle group. Lumbar motion is full but painful with right rotation and lateral flexion. Lower extremity strength sensation and reflexes are equal normal bilaterally. Normal gait.  Lab and Radiology Results Results for orders placed or performed in visit on 09/20/17 (from the past 72 hour(s))  POCT Urinalysis Dipstick     Status: None   Collection Time: 09/20/17  9:34 AM  Result Value  Ref Range   Color, UA YELLOW    Clarity, UA CLEAR    Glucose, UA Negative Negative   Bilirubin, UA NEGATIVE    Ketones, UA NEGATIVE    Spec Grav, UA 1.015 1.010 - 1.025   Blood, UA NEGATIVE    pH, UA 7.5 5.0 - 8.0   Protein, UA Negative Negative   Urobilinogen, UA 0.2 0.2 or 1.0 E.U./dL   Nitrite, UA NEGATIVE    Leukocytes, UA Negative Negative   Appearance     Odor     No results found.    Assessment and Plan: 42 y.o. female with right groin and low back pain.  Etiology is unclear.  Most likely diagnosis is muscular pain.  Differential does include also ovarian cyst or even early appendicitis.  Urinary tract etiology is unlikely given normal point-of-care urinalysis dipstick.  Plan for relative rest oral prescription strength NSAIDs, muscle relaxer, TENS unit heating pad and referral to physical therapy.  If not improving recheck.  If patient worsens or probably next step would be CT scan of the abdomen and pelvis with contrast to evaluate for appendicitis or large ovarian cyst.  Precautions reviewed with patient.   Orders Placed This Encounter  Procedures  . Ambulatory referral to Physical Therapy    Referral Priority:   Routine    Referral Type:   Physical Medicine    Referral Reason:   Specialty Services Required    Requested Specialty:  Physical Therapy  . POCT Urinalysis Dipstick   Meds ordered this encounter  Medications  . cyclobenzaprine (FLEXERIL) 10 MG tablet    Sig: Take 1 tablet (10 mg total) by mouth 3 (three) times daily as needed for muscle spasms.    Dispense:  30 tablet    Refill:  0     Historical information moved to improve visibility of documentation.  Past Medical History:  Diagnosis Date  . Anemia    Has sickle cell trait  . GERD (gastroesophageal reflux disease)    diet controlled  . Gestational diabetes   . H/O vaginal delivery 2010  . Headache(784.0)    Migraines  . Herpes 2007   hx of  . Hypertension    no current medications  .  Iron deficiency anemia, unspecified 10/20/2012  . PONV (postoperative nausea and vomiting)    has had PONV with previous c/s and also lap band surgery 2004  . Seasonal allergies    Past Surgical History:  Procedure Laterality Date  . BILATERAL SALPINGECTOMY  07/16/2012   Procedure: BILATERAL DISTAL SALPINGECTOMY;  Surgeon: Jamie Bores, MD;  Location: [facility];  Service: Obstetrics;;  . CESAREAN SECTION  2012  . CESAREAN SECTION N/A 07/16/2012   Procedure: REPEAT CESAREAN SECTION;  Surgeon: Jamie Bores, MD;  Location: [facility];  Service: Obstetrics;  Laterality: N/A;  . CHOLECYSTECTOMY N/A 11/14/2012   Procedure: LAPAROSCOPIC CHOLECYSTECTOMY;  Surgeon: Jamie Curry, MD;  Location: [facility];  Service: General;  Laterality: N/A;  . GASTRIC BANDING PORT REVISION N/A 11/14/2012   Procedure: GASTRIC BANDING PORT REVISION;  Surgeon: Jamie Curry, MD;  Location: [facility];  Service: General;  Laterality: N/A;  PORT PLACEMENT MOVED NEW MESH INSERTED   . LAPAROSCOPIC GASTRIC BANDING  2005  . LAPAROSCOPIC VAGINAL HYSTERECTOMY WITH SALPINGECTOMY Bilateral 10/12/2015   Procedure: HYSTERECTOMY TOTAL LAPAROSCOPIC BILATERAL SALPINGECTOMY;  Surgeon: Jamie Hay, DO;  Location: [facility];  Service: Gynecology;  Laterality: Bilateral;  . reposition gastric band  2006   x2  . TUBAL LIGATION  2014  . WISDOM TOOTH EXTRACTION  1999   Social History   Tobacco Use  . Smoking status: Never Smoker  . Smokeless tobacco: Never Used  Substance Use Topics  . Alcohol use: Yes    Comment: occ   family history includes Breast cancer in her maternal grandmother; Diabetes in her unknown relative; Hypertension in her mother; Thyroid cancer in her unknown relative.  Medications: Current Outpatient Medications  Medication Sig Dispense Refill  . Insulin Pen Needle (BD PEN NEEDLE NANO U/F) 32G X 4 MM MISC USE TO INJECT INTO SKIN DAILY AS DIRECTED 100 each 2  . Iron-FA-B Cmp-C-Biot-Probiotic (FUSION PLUS) CAPS  Take 1 capsule by mouth daily. 90 capsule 3  . levocetirizine (XYZAL) 5 MG tablet Take 1 tablet (5 mg total) by mouth every evening. 90 tablet 3  . Liraglutide -Weight Management (SAXENDA) 18 MG/3ML SOPN Inject 3 mg into the skin daily. 15 mL 2  . omeprazole (PRILOSEC) 20 MG capsule TAKE 1 CAPSULE BY MOUTH EVERY DAY 90 capsule 1  . sertraline (ZOLOFT) 50 MG tablet Take 1.5 tablets (75 mg total) by mouth daily. 1 tablet 0  . SUMAtriptan (IMITREX) 100 MG tablet TAKE 1 TABLET BY MOUTH EVERY 2HRS AS NEEDED FOR MIGRAINE. (MAY REPEAT IN 2HRS IF HEADACHE PERSIST) 10 tablet 3  . Tretinoin Microsphere (RETIN-A MICRO PUMP) 0.06 % GEL Apply 1 application topically at bedtime. 50 g 1  . cyclobenzaprine (  FLEXERIL) 10 MG tablet Take 1 tablet (10 mg total) by mouth 3 (three) times daily as needed for muscle spasms. 30 tablet 0   No current facility-administered medications for this visit.    Allergies  Allergen Reactions  . Latex Itching    Pt states she gets severely itchy with latex  . Naproxen Rash     Discussed warning signs or symptoms. Please see discharge instructions. Patient expresses understanding.

## 2017-09-20 NOTE — Patient Instructions (Signed)
Thank you for coming in today. Use a heating pad and TENS unit as needed.  Use the muscle relaxer as needed mostly at bedtime.  Recheck if not improving.  If a lot worse we can do a CT scan.  If your belly pain worsens, or you have high fever, bad vomiting, blood in your stool or black tarry stool go to the Emergency Room.   TENS UNIT: This is helpful for muscle pain and spasm.   Search and Purchase a TENS 7000 2nd edition at  www.tenspros.com or www.[employer].com It should be less than $30.     TENS unit instructions: Do not shower or bathe with the unit on Turn the unit off before removing electrodes or batteries If the electrodes lose stickiness add a drop of water to the electrodes after they are disconnected from the unit and place on plastic sheet. If you continued to have difficulty, call the TENS unit company to purchase more electrodes. Do not apply lotion on the skin area prior to use. Make sure the skin is clean and dry as this will help prolong the life of the electrodes. After use, always check skin for unusual red areas, rash or other skin difficulties. If there are any skin problems, does not apply electrodes to the same area. Never remove the electrodes from the unit by pulling the wires. Do not use the TENS unit or electrodes other than as directed. Do not change electrode placement without consultating your therapist or physician. Keep 2 fingers with between each electrode. Wear time ratio is 2:1, on to off times.    For example on for 30 minutes off for 15 minutes and then on for 30 minutes off for 15 minutes

## 2017-09-24 ENCOUNTER — Ambulatory Visit: Payer: 59 | Admitting: Physical Therapy

## 2017-09-29 DIAGNOSIS — Z23 Encounter for immunization: Secondary | ICD-10-CM | POA: Diagnosis not present

## 2017-10-01 ENCOUNTER — Encounter: Payer: Self-pay | Admitting: Family Medicine

## 2017-10-01 ENCOUNTER — Ambulatory Visit (INDEPENDENT_AMBULATORY_CARE_PROVIDER_SITE_OTHER): Payer: 59 | Admitting: Family Medicine

## 2017-10-01 VITALS — BP 131/78 | HR 65 | Ht 67.0 in | Wt 204.0 lb

## 2017-10-01 DIAGNOSIS — M25551 Pain in right hip: Secondary | ICD-10-CM

## 2017-10-01 DIAGNOSIS — F419 Anxiety disorder, unspecified: Secondary | ICD-10-CM | POA: Diagnosis not present

## 2017-10-01 DIAGNOSIS — M549 Dorsalgia, unspecified: Secondary | ICD-10-CM

## 2017-10-01 DIAGNOSIS — F329 Major depressive disorder, single episode, unspecified: Secondary | ICD-10-CM

## 2017-10-01 DIAGNOSIS — F32A Depression, unspecified: Secondary | ICD-10-CM

## 2017-10-01 DIAGNOSIS — Z6831 Body mass index (BMI) 31.0-31.9, adult: Secondary | ICD-10-CM

## 2017-10-01 DIAGNOSIS — R635 Abnormal weight gain: Secondary | ICD-10-CM | POA: Diagnosis not present

## 2017-10-01 MED ORDER — CYCLOBENZAPRINE HCL 10 MG PO TABS: 10.0000 mg | ORAL_TABLET | Freq: Every evening | ORAL | 0 refills | Status: DC | PRN

## 2017-10-01 MED ORDER — SERTRALINE HCL 100 MG PO TABS: 100.0000 mg | ORAL_TABLET | Freq: Every day | ORAL | 0 refills | Status: DC

## 2017-10-01 NOTE — Progress Notes (Signed)
Subjective:    Patient ID: Jamie Mccarthy, female    DOB: May 22, 1975, 42 y.o.   MRN: [id number]  HPI 42 yo female is here today for f/u anxiety.  She was started on sertraline in June.  She was out of work for a short period of time and is now back to work.  She was working with her therapist, but has not been back since she started back at work..  We increased her sertraline to 75 mg daily about 7 weeks ago.  Overall she feels like the medication has been helping her.  She feels like she can just let things go a little bit more easily.  F/U abnormal weight gain - she has gained some weight since being on sertraline.  She feels like the stress may have contributed as well.  Though fortunately she is actually lost 3 pounds since she was last year couple weeks ago.  Also came and saw 1 of my partners for right hip and low back pain.  He had recommended physical therapy but she wanted to give it a little bit more time to see if it improved on her own at home.  The muscle relaxer does seem to help some and she does need a refill on that today.  Review of Systems  BP 131/78   Pulse 65   Ht 5\' 7"  (1.702 m)   Wt 204 lb (92.5 kg)   LMP 08/29/2015 (Approximate)   SpO2 100%   BMI 31.95 kg/m     Allergies  Allergen Reactions  . Latex Itching    Pt states she gets severely itchy with latex  . Naproxen Rash    Past Medical History:  Diagnosis Date  . Anemia    Has sickle cell trait  . GERD (gastroesophageal reflux disease)    diet controlled  . Gestational diabetes   . H/O vaginal delivery 2010  . Headache(784.0)    Migraines  . Herpes 2007   hx of  . Hypertension    no current medications  . Iron deficiency anemia, unspecified 10/20/2012  . PONV (postoperative nausea and vomiting)    has had PONV with previous c/s and also lap band surgery 2004  . Seasonal allergies     Past Surgical History:  Procedure Laterality Date  . BILATERAL SALPINGECTOMY  07/16/2012   Procedure:  BILATERAL DISTAL SALPINGECTOMY;  Surgeon: Logan Bores, MD;  Location: [facility];  Service: Obstetrics;;  . CESAREAN SECTION  2012  . CESAREAN SECTION N/A 07/16/2012   Procedure: REPEAT CESAREAN SECTION;  Surgeon: Logan Bores, MD;  Location: [facility];  Service: Obstetrics;  Laterality: N/A;  . CHOLECYSTECTOMY N/A 11/14/2012   Procedure: LAPAROSCOPIC CHOLECYSTECTOMY;  Surgeon: Gayland Curry, MD;  Location: [facility];  Service: General;  Laterality: N/A;  . GASTRIC BANDING PORT REVISION N/A 11/14/2012   Procedure: GASTRIC BANDING PORT REVISION;  Surgeon: Gayland Curry, MD;  Location: [facility];  Service: General;  Laterality: N/A;  PORT PLACEMENT MOVED NEW MESH INSERTED   . LAPAROSCOPIC GASTRIC BANDING  2005  . LAPAROSCOPIC VAGINAL HYSTERECTOMY WITH SALPINGECTOMY Bilateral 10/12/2015   Procedure: HYSTERECTOMY TOTAL LAPAROSCOPIC BILATERAL SALPINGECTOMY;  Surgeon: Sherlyn Hay, DO;  Location: [facility];  Service: Gynecology;  Laterality: Bilateral;  . reposition gastric band  2006   x2  . TUBAL LIGATION  2014  . WISDOM TOOTH EXTRACTION  1999    Social History   Socioeconomic History  . Marital status: Married  Spouse name: Not on file  . Number of children: 1  . Years of education: Not on file  . Highest education level: Not on file  Occupational History  . Occupation: Theatre manager: [employer]  . Financial resource strain: Not on file  . Food insecurity:    Worry: Not on file    Inability: Not on file  . Transportation needs:    Medical: Not on file    Non-medical: Not on file  Tobacco Use  . Smoking status: Never Smoker  . Smokeless tobacco: Never Used  Substance and Sexual Activity  . Alcohol use: Yes    Comment: occ  . Drug use: No  . Sexual activity: Yes    Comment: separated, 2 caffeine drinks daily.   Lifestyle  . Physical activity:    Days per week: Not on file    Minutes per session: Not on file  . Stress: Not on file   Relationships  . Social connections:    Talks on phone: Not on file    Gets together: Not on file    Attends religious service: Not on file    Active member of club or organization: Not on file    Attends meetings of clubs or organizations: Not on file    Relationship status: Not on file  . Intimate partner violence:    Fear of current or ex partner: Not on file    Emotionally abused: Not on file    Physically abused: Not on file    Forced sexual activity: Not on file  Other Topics Concern  . Not on file  Social History Narrative   2 caffeine drinks per day    Family History  Problem Relation Age of Onset  . Hypertension Mother   . Diabetes Unknown        father's family  . Thyroid cancer Unknown   . Breast cancer Maternal Grandmother     Outpatient Encounter Medications as of 10/01/2017  Medication Sig  . cyclobenzaprine (FLEXERIL) 10 MG tablet Take 1 tablet (10 mg total) by mouth at bedtime as needed for muscle spasms.  . Insulin Pen Needle (BD PEN NEEDLE NANO U/F) 32G X 4 MM MISC USE TO INJECT INTO SKIN DAILY AS DIRECTED  . Iron-FA-B Cmp-C-Biot-Probiotic (FUSION PLUS) CAPS Take 1 capsule by mouth daily.  Marland Kitchen levocetirizine (XYZAL) 5 MG tablet Take 1 tablet (5 mg total) by mouth every evening.  . Liraglutide -Weight Management (SAXENDA) 18 MG/3ML SOPN Inject 3 mg into the skin daily.  Marland Kitchen omeprazole (PRILOSEC) 20 MG capsule TAKE 1 CAPSULE BY MOUTH EVERY DAY  . sertraline (ZOLOFT) 100 MG tablet Take 1 tablet (100 mg total) by mouth daily.  . SUMAtriptan (IMITREX) 100 MG tablet TAKE 1 TABLET BY MOUTH EVERY 2HRS AS NEEDED FOR MIGRAINE. (MAY REPEAT IN 2HRS IF HEADACHE PERSIST)  . Tretinoin Microsphere (RETIN-A MICRO PUMP) 0.06 % GEL Apply 1 application topically at bedtime.  . [DISCONTINUED] cyclobenzaprine (FLEXERIL) 10 MG tablet Take 1 tablet (10 mg total) by mouth 3 (three) times daily as needed for muscle spasms.  . [DISCONTINUED] sertraline (ZOLOFT) 50 MG tablet Take 1.5  tablets (75 mg total) by mouth daily.   No facility-administered encounter medications on file as of 10/01/2017.          Objective:   Physical Exam  Constitutional: She is oriented to person, place, and time. She appears well-developed and well-nourished.  HENT:  Head: Normocephalic and  atraumatic.  Cardiovascular: Normal rate, regular rhythm and normal heart sounds.  Pulmonary/Chest: Effort normal and breath sounds normal.  Neurological: She is alert and oriented to person, place, and time.  Skin: Skin is warm and dry.  Psychiatric: She has a normal mood and affect. Her behavior is normal.        Assessment & Plan:  GAD/Depression -PHQ 9 score down to 7 and gad 7 score of 5.  PHQ 9 improved compared to previous.  Discussed options.  We are going to increase sertraline to 100 mg.  New prescription sent to pharmacy.  She is doing well follow-up in 3 months.  She can always go back down to 75 mg and if she prefers she can call me back for prescription for 50 mg tabs if needed.  Encouraged her to check back in with her therapist may be at least one more time since she is now back at work.  Abnormal weight gain/ BMI 31 -she is down 3 pounds and doing well.  Continue with Saxenda.  Right hip and low back pain-did encourage her to go ahead and schedule with physical therapy at this point if she is really not improving then she needs more definitive treatment and therapy.  I did go ahead and refill her cyclobenzaprine today.

## 2017-10-10 ENCOUNTER — Encounter: Payer: Self-pay | Admitting: Rehabilitative and Restorative Service Providers"

## 2017-10-10 ENCOUNTER — Ambulatory Visit: Payer: 59 | Admitting: Rehabilitative and Restorative Service Providers"

## 2017-10-10 DIAGNOSIS — R29898 Other symptoms and signs involving the musculoskeletal system: Secondary | ICD-10-CM | POA: Diagnosis not present

## 2017-10-10 DIAGNOSIS — M545 Low back pain, unspecified: Secondary | ICD-10-CM
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
## 2017-10-10 NOTE — Patient Instructions (Signed)
Abdominal Bracing With Pelvic Floor (Hook-Lying)    With neutral spine, tighten pelvic floor and abdominals squeezing belly button to back bone; tighten muscles in low back at waist. Hold 10 sec  Repeat _10__ times. Do _several __ times a day. Progress to do this in sitting; standing; walking and with functional activities   Lower Trunk Rotation Stretch    Keeping back flat and feet together, rotate knees to left side. Hold __15-20__ seconds. Repeat __3-4__ times per set.  Do __2__ sessions per day.   Knee to Chest can keep bent     Lying supine, bend   knee to chest _hold 30 sec repeat 3 __ times. Repeat with other leg. Do __2_ times per day.   Piriformis Stretch   Lying on back, pull right knee toward opposite shoulder. Hold 30 seconds. Repeat 3 times. Do 2-3 sessions per day.   TENS UNIT: This is helpful for muscle pain and spasm.   Search and Purchase a TENS 7000 2nd edition at www.tenspros.com. It should be less than $30.     TENS unit instructions: Do not shower or bathe with the unit on Turn the unit off before removing electrodes or batteries If the electrodes lose stickiness add a drop of water to the electrodes after they are disconnected from the unit and place on plastic sheet. If you continued to have difficulty, call the TENS unit company to purchase more electrodes. Do not apply lotion on the skin area prior to use. Make sure the skin is clean and dry as this will help prolong the life of the electrodes. After use, always check skin for unusual red areas, rash or other skin difficulties. If there are any skin problems, does not apply electrodes to the same area. Never remove the electrodes from the unit by pulling the wires. Do not use the TENS unit or electrodes other than as directed. Do not change electrode placement without consultating your therapist or physician. Keep 2 fingers with between each electrode.   Sleeping on Back  Place pillow  under knees. A pillow with cervical support and a roll around waist are also helpful. Copyright  VHI. All rights reserved.  Sleeping on Side Place pillow between knees. Use cervical support under neck and a roll around waist as needed. Copyright  VHI. All rights reserved.   Sleeping on Stomach   If this is the only desirable sleeping position, place pillow under lower legs, and under stomach or chest as needed.  Posture - Sitting   Sit upright, head facing forward. Try using a roll to support lower back. Keep shoulders relaxed, and avoid rounded back. Keep hips level with knees. Avoid crossing legs for long periods. Stand to Sit / Sit to Stand   To sit: Bend knees to lower self onto front edge of chair, then scoot back on seat. To stand: Reverse sequence by placing one foot forward, and scoot to front of seat. Use rocking motion to stand up.   Work Height and Reach  Ideal work height is no more than 2 to 4 inches below elbow level when standing, and at elbow level when sitting. Reaching should be limited to arm's length, with elbows slightly bent.  Bending  Bend at hips and knees, not back. Keep feet shoulder-width apart.    Posture - Standing   Good posture is important. Avoid slouching and forward head thrust. Maintain curve in low back and align ears over shoul- ders, hips over ankles.  Alternating Positions  Alternate tasks and change positions frequently to reduce fatigue and muscle tension. Take rest breaks. Computer Work   Position work to Programmer, multimedia. Use proper work and seat height. Keep shoulders back and down, wrists straight, and elbows at right angles. Use chair that provides full back support. Add footrest and lumbar roll as needed.  Getting Into / Out of Car  Lower self onto seat, scoot back, then bring in one leg at a time. Reverse sequence to get out.  Dressing  Lie on back to pull socks or slacks over feet, or sit and bend leg while keeping back  straight.    Housework - Sink  Place one foot on ledge of cabinet under sink when standing at sink for prolonged periods.   Pushing / Pulling  Pushing is preferable to pulling. Keep back in proper alignment, and use leg muscles to do the work.  Deep Squat   Squat and lift with both arms held against upper trunk. Tighten stomach muscles without holding breath. Use smooth movements to avoid jerking.  Avoid Twisting   Avoid twisting or bending back. Pivot around using foot movements, and bend at knees if needed when reaching for articles.  Carrying Luggage   Distribute weight evenly on both sides. Use a cart whenever possible. Do not twist trunk. Move body as a unit.   Lifting Principles .Maintain proper posture and head alignment. .Slide object as close as possible before lifting. .Move obstacles out of the way. .Test before lifting; ask for help if too heavy. .Tighten stomach muscles without holding breath. .Use smooth movements; do not jerk. .Use legs to do the work, and pivot with feet. .Distribute the work load symmetrically and close to the center of trunk. .Push instead of pull whenever possible.   Ask For Help   Ask for help and delegate to others when possible. Coordinate your movements when lifting together, and maintain the low back curve.  Log Roll   Lying on back, bend left knee and place left arm across chest. Roll all in one movement to the right. Reverse to roll to the left. Always move as one unit. Housework - Sweeping  Use long-handled equipment to avoid stooping.   Housework - Wiping  Position yourself as close as possible to reach work surface. Avoid straining your back.  Laundry - Unloading Wash   To unload small items at bottom of washer, lift leg opposite to arm being used to reach.  Br[place]on close to area to be raked. Use arm movements to do the work. Keep back straight and avoid twisting.     Cart  When  reaching into cart with one arm, lift opposite leg to keep back straight.   Getting Into / Out of Bed  Lower self to lie down on one side by raising legs and lowering head at the same time. Use arms to assist moving without twisting. Bend both knees to roll onto back if desired. To sit up, start from lying on side, and use same move-ments in reverse. Housework - Vacuuming  Hold the vacuum with arm held at side. Step back and forth to move it, keeping head up. Avoid twisting.   Laundry - IT consultant so that bending and twisting can be avoided.   Laundry - Unloading Dryer  Squat down to reach into clothes dryer or use a reacher.  Gardening - Weeding / Probation officer or Kneel. Knee pads may be helpful.                      Marland Kitchen

## 2017-10-15 ENCOUNTER — Encounter: Payer: 59 | Admitting: Rehabilitative and Restorative Service Providers"

## 2017-10-17 ENCOUNTER — Encounter: Payer: 59 | Admitting: Physical Therapy

## 2017-10-27 ENCOUNTER — Other Ambulatory Visit: Payer: Self-pay | Admitting: Family Medicine

## 2017-11-06 DIAGNOSIS — R112 Nausea with vomiting, unspecified: Secondary | ICD-10-CM | POA: Diagnosis not present

## 2017-11-06 DIAGNOSIS — E663 Overweight: Secondary | ICD-10-CM | POA: Diagnosis not present

## 2017-11-06 DIAGNOSIS — Z9884 Bariatric surgery status: Secondary | ICD-10-CM | POA: Diagnosis not present

## 2017-11-08 ENCOUNTER — Other Ambulatory Visit: Payer: Self-pay | Admitting: General Surgery

## 2017-11-08 DIAGNOSIS — R112 Nausea with vomiting, unspecified: Secondary | ICD-10-CM

## 2017-11-13 ENCOUNTER — Ambulatory Visit: Payer: 59 | Admitting: Physician Assistant

## 2017-11-21 ENCOUNTER — Inpatient Hospital Stay
Admission: RE | Admit: 2017-11-21 | Discharge: 2017-11-21 | Disposition: A | Discharge status: Home / Self Care | Payer: 59 | Source: Ambulatory Visit | Attending: General Surgery | Admitting: General Surgery

## 2017-12-01 ENCOUNTER — Other Ambulatory Visit: Payer: Self-pay | Admitting: Family Medicine

## 2017-12-06 ENCOUNTER — Encounter: Payer: Self-pay | Admitting: Physician Assistant

## 2017-12-06 ENCOUNTER — Ambulatory Visit (INDEPENDENT_AMBULATORY_CARE_PROVIDER_SITE_OTHER): Payer: 59

## 2017-12-06 ENCOUNTER — Ambulatory Visit (INDEPENDENT_AMBULATORY_CARE_PROVIDER_SITE_OTHER): Payer: 59 | Admitting: Physician Assistant

## 2017-12-06 VITALS — BP 138/82 | HR 69 | Resp 16 | Wt 224.0 lb

## 2017-12-06 DIAGNOSIS — R232 Flushing: Secondary | ICD-10-CM | POA: Diagnosis not present

## 2017-12-06 DIAGNOSIS — R0602 Shortness of breath: Secondary | ICD-10-CM | POA: Diagnosis not present

## 2017-12-06 DIAGNOSIS — H8111 Benign paroxysmal vertigo, right ear: Secondary | ICD-10-CM | POA: Diagnosis not present

## 2017-12-06 DIAGNOSIS — R05 Cough: Secondary | ICD-10-CM

## 2017-12-06 DIAGNOSIS — I951 Orthostatic hypotension: Secondary | ICD-10-CM | POA: Insufficient documentation

## 2017-12-06 IMAGING — DX DG CHEST 2V
2 series · 2 of 2 positions shown · non-contrast
Comparison: [DATE]

CLINICAL DATA: Cough and shortness of breath

EXAM:
CHEST - 2 VIEW

[chest pa]
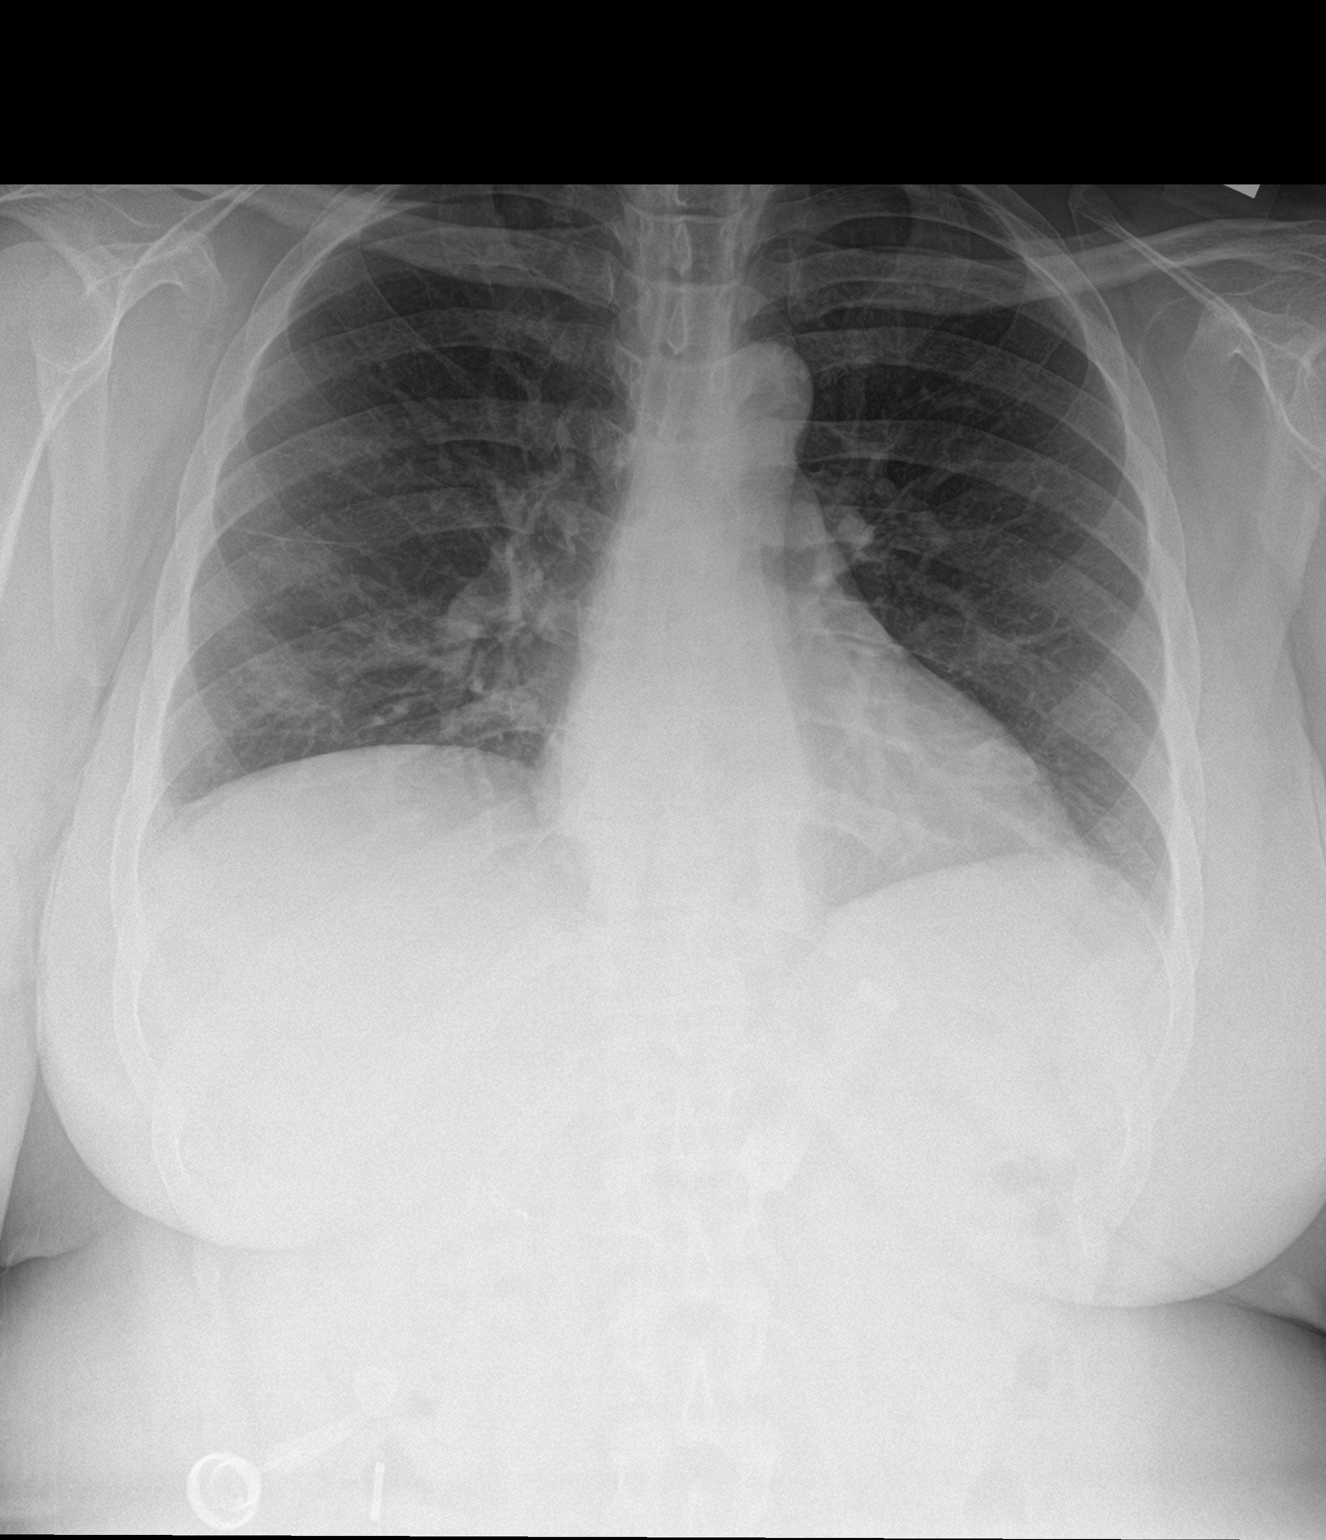

[chest lat]
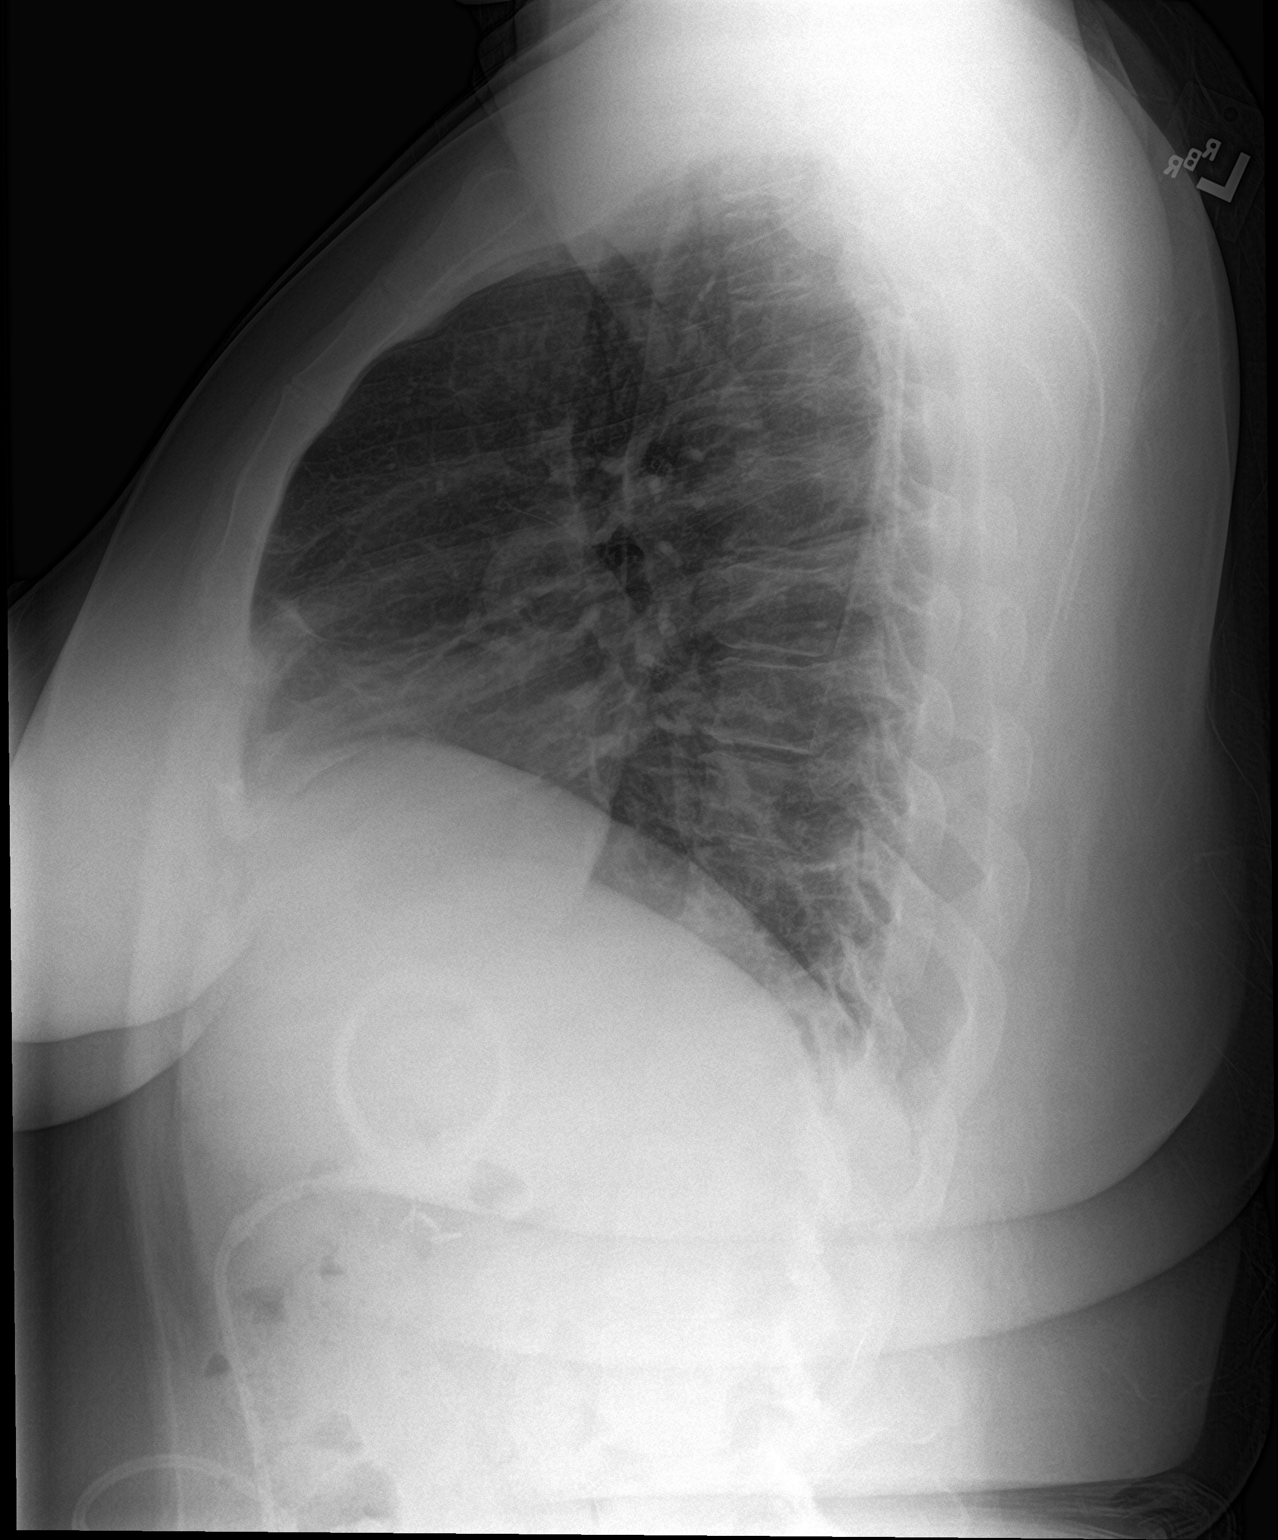

[2 of 2 positions shown; findings below may reference images not displayed]

FINDINGS: There is no appreciable edema or consolidation. Heart size and
pulmonary vascularity are normal. No adenopathy. There is a lap band
at the gastric cardia. No bone lesions.
IMPRESSION: No edema or consolidation.

## 2017-12-06 MED ORDER — PREDNISONE 20 MG PO TABS: 40.0000 mg | ORAL_TABLET | Freq: Every day | ORAL | 0 refills | Status: AC

## 2017-12-06 MED ORDER — MECLIZINE HCL 25 MG PO TABS: 25.0000 mg | ORAL_TABLET | Freq: Three times a day (TID) | ORAL | 0 refills | Status: DC | PRN

## 2017-12-06 NOTE — Patient Instructions (Signed)

## 2017-12-06 NOTE — Progress Notes (Signed)
Acute Office Visit  Subjective:    Patient ID: Jamie Mccarthy, female    DOB: August 06, 1975, 42 y.o.   MRN: [id number]  Chief Complaint  Patient presents with  . Dizziness  . Shortness of Breath    HPI Patient is in today for dizziness/SOB Reports vomiting last night about 30 minutes after dinner. She has a history of GERD, so she didn't think much of it. Woke up feeling sweaty and having chest tightness/wheezing and SOB this morning. Developed dizziness when walking around getting ready for work and showering. Described as "swirling." Endorses a mild headache described as pressure in her occipital region.  precipitating factors: position changes worse w/head movements: yes, bending and standing history of vertigo: yes History of migraines: yes hearing change: left ear feels "clogged" Vision change: none gait disturbance: none Syncope: none   Past Medical History:  Diagnosis Date  . Anemia    Has sickle cell trait  . GERD (gastroesophageal reflux disease)    diet controlled  . Gestational diabetes   . H/O vaginal delivery 2010  . Headache(784.0)    Migraines  . Herpes 2007   hx of  . Hypertension    no current medications  . Iron deficiency anemia, unspecified 10/20/2012  . PONV (postoperative nausea and vomiting)    has had PONV with previous c/s and also lap band surgery 2004  . Seasonal allergies     Past Surgical History:  Procedure Laterality Date  . BILATERAL SALPINGECTOMY  07/16/2012   Procedure: BILATERAL DISTAL SALPINGECTOMY;  Surgeon: Logan Bores, MD;  Location: [facility];  Service: Obstetrics;;  . CESAREAN SECTION  2012  . CESAREAN SECTION N/A 07/16/2012   Procedure: REPEAT CESAREAN SECTION;  Surgeon: Logan Bores, MD;  Location: [facility];  Service: Obstetrics;  Laterality: N/A;  . CHOLECYSTECTOMY N/A 11/14/2012   Procedure: LAPAROSCOPIC CHOLECYSTECTOMY;  Surgeon: Gayland Curry, MD;  Location: [facility];  Service: General;  Laterality: N/A;  .  GASTRIC BANDING PORT REVISION N/A 11/14/2012   Procedure: GASTRIC BANDING PORT REVISION;  Surgeon: Gayland Curry, MD;  Location: [facility];  Service: General;  Laterality: N/A;  PORT PLACEMENT MOVED NEW MESH INSERTED   . LAPAROSCOPIC GASTRIC BANDING  2005  . LAPAROSCOPIC VAGINAL HYSTERECTOMY WITH SALPINGECTOMY Bilateral 10/12/2015   Procedure: HYSTERECTOMY TOTAL LAPAROSCOPIC BILATERAL SALPINGECTOMY;  Surgeon: Sherlyn Hay, DO;  Location: [facility];  Service: Gynecology;  Laterality: Bilateral;  . reposition gastric band  2006   x2  . TUBAL LIGATION  2014  . WISDOM TOOTH EXTRACTION  1999    Family History  Problem Relation Age of Onset  . Hypertension Mother   . Diabetes Unknown        father's family  . Thyroid cancer Unknown   . Breast cancer Maternal Grandmother     Social History   Socioeconomic History  . Marital status: Married    Spouse name: Not on file  . Number of children: 1  . Years of education: Not on file  . Highest education level: Not on file  Occupational History  . Occupation: Theatre manager: [employer]  . Financial resource strain: Not on file  . Food insecurity:    Worry: Not on file    Inability: Not on file  . Transportation needs:    Medical: Not on file    Non-medical: Not on file  Tobacco Use  . Smoking status: Never Smoker  . Smokeless tobacco:  Never Used  Substance and Sexual Activity  . Alcohol use: Yes    Comment: occ  . Drug use: No  . Sexual activity: Yes    Comment: separated, 2 caffeine drinks daily.   Lifestyle  . Physical activity:    Days per week: Not on file    Minutes per session: Not on file  . Stress: Not on file  Relationships  . Social connections:    Talks on phone: Not on file    Gets together: Not on file    Attends religious service: Not on file    Active member of club or organization: Not on file    Attends meetings of clubs or organizations: Not on file    Relationship status:  Not on file  . Intimate partner violence:    Fear of current or ex partner: Not on file    Emotionally abused: Not on file    Physically abused: Not on file    Forced sexual activity: Not on file  Other Topics Concern  . Not on file  Social History Narrative   2 caffeine drinks per day    Outpatient Medications Prior to Visit  Medication Sig Dispense Refill  . cyclobenzaprine (FLEXERIL) 10 MG tablet TAKE 1 TABLET BY MOUTH AT BEDTIME AS NEEDED FOR MUSCLE SPASMS 30 tablet 0  . Insulin Pen Needle (BD PEN NEEDLE NANO U/F) 32G X 4 MM MISC USE TO INJECT INTO SKIN DAILY AS DIRECTED 100 each 2  . Iron-FA-B Cmp-C-Biot-Probiotic (FUSION PLUS) CAPS Take 1 capsule by mouth daily. 90 capsule 3  . levocetirizine (XYZAL) 5 MG tablet Take 1 tablet (5 mg total) by mouth every evening. 90 tablet 3  . Liraglutide -Weight Management (SAXENDA) 18 MG/3ML SOPN Inject 3 mg into the skin daily. 15 mL 2  . SUMAtriptan (IMITREX) 100 MG tablet TAKE 1 TABLET BY MOUTH EVERY 2HRS AS NEEDED FOR MIGRAINE. (MAY REPEAT IN 2HRS IF HEADACHE PERSIST) 10 tablet 3  . Tretinoin Microsphere (RETIN-A MICRO PUMP) 0.06 % GEL Apply 1 application topically at bedtime. 50 g 1  . omeprazole (PRILOSEC) 20 MG capsule TAKE 1 CAPSULE BY MOUTH EVERY DAY 90 capsule 1  . sertraline (ZOLOFT) 100 MG tablet Take 1 tablet (100 mg total) by mouth daily. 90 tablet 0   No facility-administered medications prior to visit.     Allergies  Allergen Reactions  . Latex Itching    Pt states she gets severely itchy with latex  . Naproxen Rash    Review of Systems  Constitutional: Positive for diaphoresis. Negative for chills and fever.  HENT: Negative for tinnitus.   Eyes: Negative for blurred vision, double vision and photophobia.  Respiratory: Positive for sputum production (throat clearing), shortness of breath and wheezing. Negative for cough and hemoptysis.   Cardiovascular: Negative for chest pain and orthopnea.  Gastrointestinal: Positive  for heartburn and vomiting.  Neurological: Positive for dizziness and headaches.       Objective:    Physical Exam  BP 138/82   Pulse 69   Resp 16   Wt 224 lb (101.6 kg)   LMP 08/29/2015 (Approximate)   BMI 35.08 kg/m  Wt Readings from Last 3 Encounters:  12/06/17 224 lb (101.6 kg)  10/01/17 204 lb (92.5 kg)  09/20/17 207 lb (93.9 kg)   Orthostatic VS for the past 24 hrs (Last 3 readings):  BP- Lying Pulse- Lying BP- Sitting Pulse- Sitting BP- Standing at 0 minutes Pulse- Standing at 0 minutes  12/06/17  1021 (!) 131/91 74 146/88 75 124/86 70   Gen: well-groomed, not ill-appearing, no acute distress HEENT: head normocephalic, atraumatic; conjunctiva and cornea clear, oropharynx clear, TM's pearly gray and semi-transparent, moist mucus membranes; neck supple, no meningeal signs Pulm: Normal work of breathing, normal phonation, clear to auscultation bilaterally CV: Normal rate, regular rhythm, s1 and s2 distinct, no murmurs, clicks or rubs Neuro:  cranial nerves II-XII intact, no nystagmus, normal finger-to-nose, normal heel-to-shin, negative pronator drift, normal rapid alternating movements, normal tone, no tremor, Epley maneuver reproduces dizziness without nystagmus on the right side MSK: strength 5/5 and symmetric in bilateral upper and lower extremities, normal gait and station, negative Romberg Mental Status: alert and oriented x 3, speech articulate, and thought processes clear and goal-directed   There are no preventive care reminders to display for this patient.  There are no preventive care reminders to display for this patient.   Lab Results  Component Value Date   TSH 0.75 03/06/2016   Lab Results  Component Value Date   WBC 6.0 07/13/2017   HGB 13.3 07/13/2017   HCT 40.3 07/13/2017   MCV 80.4 07/13/2017   PLT 291 07/13/2017   Lab Results  Component Value Date   NA 139 07/13/2017   K 4.1 07/13/2017   CO2 26 07/13/2017   GLUCOSE 108 (H) 07/13/2017    BUN 7 07/13/2017   CREATININE 0.72 07/13/2017   BILITOT 1.4 (H) 10/13/2015   ALKPHOS 46 10/13/2015   AST 18 10/13/2015   ALT 12 (L) 10/13/2015   PROT 5.8 (L) 10/13/2015   ALBUMIN 3.1 (L) 10/13/2015   CALCIUM 8.8 (L) 07/13/2017   ANIONGAP 5 07/13/2017   Lab Results  Component Value Date   CHOL 133 06/15/2015   Lab Results  Component Value Date   HDL 44 (L) 06/15/2015   Lab Results  Component Value Date   LDLCALC 79 06/15/2015   Lab Results  Component Value Date   TRIG 51 06/15/2015   Lab Results  Component Value Date   CHOLHDL 3.0 06/15/2015   No results found for: HGBA1C     Assessment & Plan:   .Jamie Mccarthy was seen today for dizziness and shortness of breath.  Diagnoses and all orders for this visit:  Orthostasis  Benign positional vertigo, right -     Ambulatory referral to Physical Therapy -     meclizine (ANTIVERT) 25 MG tablet; Take 1 tablet (25 mg total) by mouth 3 (three) times daily as needed for dizziness or nausea.  Shortness of breath -     DG Chest 2 View -     predniSONE (DELTASONE) 20 MG tablet; Take 2 tablets (40 mg total) by mouth daily with breakfast for 5 days.   Benign neuro exam Patient was mildly orthostatic with SBP drop of 22 mmHg from sitting to standing Epley maneuver reproduced dizziness on the right side, no nystagmus Treating for BPPV with Epley maneuver, prednisone and meclizine prn for no longer than 3 days Referral placed to vestibular rehab should symptoms worsen or fail to improve  Unable to obtain pulse ox due to nail lacquer Did not appreciate any wheezes on exam. No tachypnea CXR to assess for pulmonary disease  Patient education and anticipatory guidance given Patient agrees with treatment plan Follow-up as needed  Darlyne Russian PA-C   [address], PA-C

## 2017-12-07 LAB — PROGESTERONE

## 2017-12-07 LAB — ESTRADIOL: ESTRADIOL: 30 pg/mL

## 2017-12-07 LAB — TSH: TSH: 1.1 mIU/L

## 2017-12-07 LAB — LUTEINIZING HORMONE: LH: 5.4 m[IU]/mL

## 2017-12-07 LAB — FOLLICLE STIMULATING HORMONE: FSH: 12.7 m[IU]/mL

## 2017-12-09 ENCOUNTER — Other Ambulatory Visit: Payer: Self-pay | Admitting: Family Medicine

## 2017-12-23 ENCOUNTER — Other Ambulatory Visit: Payer: Self-pay | Admitting: Family Medicine

## 2017-12-31 ENCOUNTER — Encounter: Payer: Self-pay | Admitting: Family Medicine

## 2017-12-31 ENCOUNTER — Ambulatory Visit (INDEPENDENT_AMBULATORY_CARE_PROVIDER_SITE_OTHER): Payer: 59 | Admitting: Family Medicine

## 2017-12-31 VITALS — BP 133/88 | HR 87 | Ht 64.0 in | Wt 217.0 lb

## 2017-12-31 DIAGNOSIS — R635 Abnormal weight gain: Secondary | ICD-10-CM | POA: Diagnosis not present

## 2017-12-31 DIAGNOSIS — F329 Major depressive disorder, single episode, unspecified: Secondary | ICD-10-CM

## 2017-12-31 DIAGNOSIS — L299 Pruritus, unspecified: Secondary | ICD-10-CM

## 2017-12-31 DIAGNOSIS — F419 Anxiety disorder, unspecified: Secondary | ICD-10-CM

## 2017-12-31 MED ORDER — FLUOCINOLONE ACETONIDE 0.01 % OT OIL: 3.0000 [drp] | TOPICAL_OIL | Freq: Every day | OTIC | 0 refills | Status: DC

## 2017-12-31 MED ORDER — LIRAGLUTIDE -WEIGHT MANAGEMENT 18 MG/3ML ~~LOC~~ SOPN: 3.0000 mg | PEN_INJECTOR | Freq: Every day | SUBCUTANEOUS | 2 refills | Status: DC

## 2017-12-31 MED ORDER — SERTRALINE HCL 50 MG PO TABS: 75.0000 mg | ORAL_TABLET | Freq: Every day | ORAL | 1 refills | Status: DC

## 2017-12-31 NOTE — Progress Notes (Signed)
Subjective:    CC: Anxiety/Depression   HPI:  42 year old female here today for 19-month follow-up for anxiety and depression.  She started on sertraline in June.  When I last saw her in September we had discussed going up to 100 mg.  She did try it but says it made her feel "floaty".  Just not quite right.  She did go back down to 75 mg but then she stopped it completely about 2 weeks ago. She hasn't been able to return to counseling since she has been back at work.    She is seeing Dr. Marianna Payment for weight loss.  She was getting B12 and amino acids.  Started phentermine on 12/4 in addition to her Saxenda.  Dr. Lajuan Lines told her that [place] could cause cancer and that she needed to be cautious.  She also has dry peeling skin in her ears. Says it is very itchy. Always  Seems to happen around this time.     BP 133/88   Pulse 87   Ht 5\' 4"  (1.626 m)   Wt 217 lb (98.4 kg)   LMP 08/29/2015 (Approximate)   SpO2 96%   BMI 37.25 kg/m     Allergies  Allergen Reactions  . Latex Itching    Pt states she gets severely itchy with latex  . Naproxen Rash    Past Medical History:  Diagnosis Date  . Anemia    Has sickle cell trait  . GERD (gastroesophageal reflux disease)    diet controlled  . Gestational diabetes   . H/O vaginal delivery 2010  . Headache(784.0)    Migraines  . Herpes 2007   hx of  . Hypertension    no current medications  . Iron deficiency anemia, unspecified 10/20/2012  . PONV (postoperative nausea and vomiting)    has had PONV with previous c/s and also lap band surgery 2004  . Seasonal allergies     Past Surgical History:  Procedure Laterality Date  . BILATERAL SALPINGECTOMY  07/16/2012   Procedure: BILATERAL DISTAL SALPINGECTOMY;  Surgeon: Logan Bores, MD;  Location: [facility];  Service: Obstetrics;;  . CESAREAN SECTION  2012  . CESAREAN SECTION N/A 07/16/2012   Procedure: REPEAT CESAREAN SECTION;  Surgeon: Logan Bores, MD;  Location: [facility];  Service:  Obstetrics;  Laterality: N/A;  . CHOLECYSTECTOMY N/A 11/14/2012   Procedure: LAPAROSCOPIC CHOLECYSTECTOMY;  Surgeon: Gayland Curry, MD;  Location: [facility];  Service: General;  Laterality: N/A;  . GASTRIC BANDING PORT REVISION N/A 11/14/2012   Procedure: GASTRIC BANDING PORT REVISION;  Surgeon: Gayland Curry, MD;  Location: [facility];  Service: General;  Laterality: N/A;  PORT PLACEMENT MOVED NEW MESH INSERTED   . LAPAROSCOPIC GASTRIC BANDING  2005  . LAPAROSCOPIC VAGINAL HYSTERECTOMY WITH SALPINGECTOMY Bilateral 10/12/2015   Procedure: HYSTERECTOMY TOTAL LAPAROSCOPIC BILATERAL SALPINGECTOMY;  Surgeon: Sherlyn Hay, DO;  Location: [facility];  Service: Gynecology;  Laterality: Bilateral;  . reposition gastric band  2006   x2  . TUBAL LIGATION  2014  . WISDOM TOOTH EXTRACTION  1999    Social History   Socioeconomic History  . Marital status: Married    Spouse name: Not on file  . Number of children: 1  . Years of education: Not on file  . Highest education level: Not on file  Occupational History  . Occupation: Theatre manager: [employer]  . Financial resource strain: Not on file  . Food insecurity:  Worry: Not on file    Inability: Not on file  . Transportation needs:    Medical: Not on file    Non-medical: Not on file  Tobacco Use  . Smoking status: Never Smoker  . Smokeless tobacco: Never Used  Substance and Sexual Activity  . Alcohol use: Yes    Comment: occ  . Drug use: No  . Sexual activity: Yes    Comment: separated, 2 caffeine drinks daily.   Lifestyle  . Physical activity:    Days per week: Not on file    Minutes per session: Not on file  . Stress: Not on file  Relationships  . Social connections:    Talks on phone: Not on file    Gets together: Not on file    Attends religious service: Not on file    Active member of club or organization: Not on file    Attends meetings of clubs or organizations: Not on file    Relationship  status: Not on file  . Intimate partner violence:    Fear of current or ex partner: Not on file    Emotionally abused: Not on file    Physically abused: Not on file    Forced sexual activity: Not on file  Other Topics Concern  . Not on file  Social History Narrative   2 caffeine drinks per day    Family History  Problem Relation Age of Onset  . Hypertension Mother   . Diabetes Unknown        father's family  . Thyroid cancer Unknown   . Breast cancer Maternal Grandmother     Outpatient Encounter Medications as of 12/31/2017  Medication Sig  . Insulin Pen Needle (BD PEN NEEDLE NANO U/F) 32G X 4 MM MISC USE TO INJECT INTO SKIN DAILY AS DIRECTED  . levocetirizine (XYZAL) 5 MG tablet Take 1 tablet (5 mg total) by mouth every evening.  . Liraglutide -Weight Management (SAXENDA) 18 MG/3ML SOPN Inject 3 mg into the skin daily.  . meclizine (ANTIVERT) 25 MG tablet Take 1 tablet (25 mg total) by mouth 3 (three) times daily as needed for dizziness or nausea.  . phentermine (ADIPEX-P) 37.5 MG tablet Take 37.5 mg by mouth daily.  . SUMAtriptan (IMITREX) 100 MG tablet TAKE 1 TABLET BY MOUTH EVERY 2HRS AS NEEDED FOR MIGRAINE. (MAY REPEAT IN 2HRS IF HEADACHE PERSIST)  . [DISCONTINUED] Liraglutide -Weight Management (SAXENDA) 18 MG/3ML SOPN Inject 3 mg into the skin daily.  . Fluocinolone Acetonide 0.01 % OIL Place 3 drops in ear(s) at bedtime.  . sertraline (ZOLOFT) 50 MG tablet Take 1.5 tablets (75 mg total) by mouth daily.  . [DISCONTINUED] cyclobenzaprine (FLEXERIL) 10 MG tablet TAKE 1 TABLET BY MOUTH AT BEDTIME AS NEEDED FOR MUSCLE SPASMS  . [DISCONTINUED] Iron-FA-B Cmp-C-Biot-Probiotic (FUSION PLUS) CAPS Take 1 capsule by mouth daily.  . [DISCONTINUED] sertraline (ZOLOFT) 100 MG tablet TAKE 1 TABLET BY MOUTH EVERY DAY (Patient not taking: Reported on 12/31/2017)  . [DISCONTINUED] Tretinoin Microsphere (RETIN-A MICRO PUMP) 0.06 % GEL Apply 1 application topically at bedtime.   No  facility-administered encounter medications on file as of 12/31/2017.        Objective:    General: Well Developed, well nourished, and in no acute distress.  Neuro: Alert and oriented x3, extra-ocular muscles intact, sensation grossly intact.  HEENT: Normocephalic, atraumatic  Skin: Warm and dry, no rashes. Cardiac: Regular rate and rhythm, no murmurs rubs or gallops, no lower extremity edema.  Respiratory:  Clear to auscultation bilaterally. Not using accessory muscles, speaking in full sentences.   Impression and Recommendations:   GAD/Depression -PHQ 9 score of 8 and gad 7 score of 7 today.  Will continue with sertraline 75 mg since that does seem to be beneficial and she feels like she tolerates that well.  I will see her back in 3 months.  Abnormal weight gain - will continue with Saxenda.  She will discontinue the phentermine.    Eczema in ears/itching-we discussed avoiding over cleaning and removing too much wax from the ear canals.  I did give her a prescription for Derm otic oil to use.  Warned about potential side effects and not to use more than 2 consecutive weeks because it can cause atrophy of the skin.

## 2018-01-08 ENCOUNTER — Other Ambulatory Visit: Payer: Self-pay | Admitting: Family Medicine

## 2018-03-13 ENCOUNTER — Ambulatory Visit: Payer: 59 | Admitting: Family Medicine

## 2018-03-13 DIAGNOSIS — J111 Influenza due to unidentified influenza virus with other respiratory manifestations: Secondary | ICD-10-CM | POA: Diagnosis not present

## 2018-04-01 ENCOUNTER — Ambulatory Visit: Payer: 59 | Admitting: Family Medicine

## 2018-05-02 ENCOUNTER — Other Ambulatory Visit: Payer: Self-pay | Admitting: Family Medicine

## 2018-05-27 ENCOUNTER — Encounter: Payer: Self-pay | Admitting: Family Medicine

## 2018-05-27 ENCOUNTER — Ambulatory Visit (INDEPENDENT_AMBULATORY_CARE_PROVIDER_SITE_OTHER): Payer: 59 | Admitting: Family Medicine

## 2018-05-27 VITALS — Ht 64.0 in | Wt 236.0 lb

## 2018-05-27 DIAGNOSIS — N926 Irregular menstruation, unspecified: Secondary | ICD-10-CM

## 2018-05-27 DIAGNOSIS — Z9884 Bariatric surgery status: Secondary | ICD-10-CM | POA: Diagnosis not present

## 2018-05-27 DIAGNOSIS — F419 Anxiety disorder, unspecified: Secondary | ICD-10-CM | POA: Diagnosis not present

## 2018-05-27 DIAGNOSIS — M549 Dorsalgia, unspecified: Secondary | ICD-10-CM

## 2018-05-27 DIAGNOSIS — R635 Abnormal weight gain: Secondary | ICD-10-CM | POA: Diagnosis not present

## 2018-05-27 DIAGNOSIS — S161XXA Strain of muscle, fascia and tendon at neck level, initial encounter: Secondary | ICD-10-CM

## 2018-05-27 DIAGNOSIS — F329 Major depressive disorder, single episode, unspecified: Secondary | ICD-10-CM

## 2018-05-27 MED ORDER — LIRAGLUTIDE -WEIGHT MANAGEMENT 18 MG/3ML ~~LOC~~ SOPN: 3.0000 mg | PEN_INJECTOR | Freq: Every day | SUBCUTANEOUS | 5 refills | Status: DC

## 2018-05-27 MED ORDER — TRAZODONE HCL 50 MG PO TABS: 25.0000 mg | ORAL_TABLET | Freq: Every evening | ORAL | 3 refills | Status: DC | PRN

## 2018-05-27 MED ORDER — SERTRALINE HCL 50 MG PO TABS: 50.0000 mg | ORAL_TABLET | Freq: Every day | ORAL | 0 refills | Status: DC

## 2018-05-27 MED ORDER — CYCLOBENZAPRINE HCL 10 MG PO TABS: ORAL_TABLET | ORAL | 0 refills | Status: DC

## 2018-05-27 NOTE — Progress Notes (Signed)
Pt reports that she is doing well on current regimen.Jamie Mccarthy, [place]

## 2018-05-27 NOTE — Progress Notes (Signed)
Virtual Visit via Video Note  I connected with Jamie Mccarthy on 05/27/18 at  8:30 AM EDT by a video enabled telemedicine application and verified that I am speaking with the correct person using two identifiers.   I discussed the limitations of evaluation and management by telemedicine and the availability of in person appointments. The patient expressed understanding and agreed to proceed.  Pt was at home and I was in my office for the virtual visit.     Subjective:    CC: 5 mo f/u mood   HPI: F/U anxiety and depression - ON sertraline 33m and doing well.  Sleep hasn't been good.  Waking up at 2AM and then can't go back to sleep for several hours.  Then she wakes up just feeling exhausted.  She says been going on for several weeks at this point.  F/U Migraines Headaches -she has been doing okay overall.  History of gastric banding-still on Saxenda.  She is on the 2.4 mg but has been gaining some weight especially the last couple of months.  In fact she was on phentermine for short period of time through Dr. CMarianna Payment  Is a little bit concerned about her being on the medication so she did stop it.  She just feels like the SKirke Shaggyis not quite as effective.  She is also been waking up in the mornings with a lot of upper back and neck shoulder tension and pain.  She said she even tried buying new pillows to see if that would make a difference.  She has been at home with the kids as her job was for load as she is a [place]  She has been helping her kids with the morning.  She has 4 children.   Past medical history, Surgical history, Family history not pertinant except as noted below, Social history, Allergies, and medications have been entered into the medical record, reviewed, and corrections made.   Review of Systems: No fevers, chills, night sweats, weight loss, chest pain, or shortness of breath.   Objective:    General: Speaking clearly in complete  sentences without any shortness of breath.  Alert and oriented x3.  Normal judgment. No apparent acute distress.  Well-groomed.    Impression and Recommendations:     Anxiety and Depression -discussed options.  She is happy with just the sertraline 50 mg so we will stick with that for now.  Prescription sent to pharmacy.  Insomnia -discussed options.  Reminded her about sleep hygiene.  Going to bed at the same time, avoiding caffeine, avoiding TV time before bedtime.  Making sure the bedroom is dark, cool and quiet etc.  Discussed options of medications including a trial of trazodone.  Start with half a tab and then increase slowly as needed depending on result.  And warned about potential for excess sedation and to monitor that carefully.  Migraine Headaches -doing well overall.  Does not need refills today.   History of gastric banding/abnormal weight gain -discussed options.  Will try going up on the STom Beanto 3.0 mg daily for at least the next month to see if this helps.  Just encourage her also to continue to work on healthy diet and being active.  I actually think getting back into her old routine and returning to work would actually be really helpful for her.  Neck strain/upper back pain-discussed working on stretches.  Were happy to mail them to her or she  can also go online to look for some stretches to start to do daily.  We also discussed avoiding position where her head is tilted forward such as looking at computer screens tablets etc. which she has been doing a lot of lately.   I discussed the assessment and treatment plan with the patient. The patient was provided an opportunity to ask questions and all were answered. The patient agreed with the plan and demonstrated an understanding of the instructions.   The patient was advised to call back or seek an in-person evaluation if the symptoms worsen or if the condition fails to improve as anticipated.   Beatrice Lecher, MD

## 2018-06-03 ENCOUNTER — Other Ambulatory Visit: Payer: Self-pay | Admitting: Family Medicine

## 2018-06-18 ENCOUNTER — Other Ambulatory Visit: Payer: Self-pay | Admitting: Family Medicine

## 2018-06-27 ENCOUNTER — Other Ambulatory Visit: Payer: Self-pay | Admitting: Family Medicine

## 2018-06-28 NOTE — Telephone Encounter (Signed)
Did you want patient to continue medication?

## 2018-07-07 ENCOUNTER — Encounter: Payer: Self-pay | Admitting: Family Medicine

## 2018-07-07 ENCOUNTER — Ambulatory Visit (INDEPENDENT_AMBULATORY_CARE_PROVIDER_SITE_OTHER): Payer: 59 | Admitting: Family Medicine

## 2018-07-07 VITALS — Wt 236.0 lb

## 2018-07-07 DIAGNOSIS — M25562 Pain in left knee: Secondary | ICD-10-CM | POA: Diagnosis not present

## 2018-07-07 DIAGNOSIS — L700 Acne vulgaris: Secondary | ICD-10-CM | POA: Diagnosis not present

## 2018-07-07 DIAGNOSIS — Z13220 Encounter for screening for lipoid disorders: Secondary | ICD-10-CM

## 2018-07-07 DIAGNOSIS — R635 Abnormal weight gain: Secondary | ICD-10-CM

## 2018-07-07 MED ORDER — BD PEN NEEDLE NANO U/F 32G X 4 MM MISC: 2 refills | Status: DC

## 2018-07-07 MED ORDER — TRETINOIN MICROSPHERE 0.04 % EX GEL: Freq: Every day | CUTANEOUS | 0 refills | Status: DC

## 2018-07-07 NOTE — Progress Notes (Signed)
Virtual Visit via Video Note  I connected with Avinger on 07/07/18 at  9:10 AM EDT by a video enabled telemedicine application and verified that I am speaking with the correct person using two identifiers.   I discussed the limitations of evaluation and management by telemedicine and the availability of in person appointments. The patient expressed understanding and agreed to proceed.  Pt was at home and I was in my office for the virtual visit.      Established Patient Office Visit  Subjective:  Patient ID: Jamie Mccarthy, female    DOB: 04-05-1975  Age: 43 y.o. MRN: [id number]  CC:  Chief Complaint  Patient presents with  . Weight Check    HPI Gaelyn A Rohl presents for abnormal weight gain. She is really struggling with her weight.  Particularly over the last year she is just gained a lot of weight.  She was down to about 160 pounds back in 2018.  She does not feel like anything has changed dramatically in regards to her diet etc.  She is actually been on the [place] for about the last 2 years and has been doing really well on it.  She admits she may not have been quite as active since she is been home but she is been taking care of her kids as she was furloughed from her job.  She has not noticed any major changes in hair.    She has noticed though that she has had more bumps and breakouts on her face.  She says she is never had that many bumps of acne and she has a couple on her chair that are particularly bothersome and swollen.  She has been seeing a [facility] and using some over-the-counter products which do seem to help but she is particularly interested in Retin-A cream.  She also reports that her left knee has been bothering her more she says she feels like at times it is a little bit more swollen.  She is noticed it more bothersome when she goes up or down stairs or if she has been sitting for a period of time and then tries to get up.  She does not remember any specific  injury or trauma.  Wt Readings from Last 3 Encounters:  07/07/18 236 lb (107 kg)  05/27/18 236 lb (107 kg)  12/31/17 217 lb (98.4 kg)    Past Medical History:  Diagnosis Date  . Anemia    Has sickle cell trait  . GERD (gastroesophageal reflux disease)    diet controlled  . Gestational diabetes   . H/O vaginal delivery 2010  . Headache(784.0)    Migraines  . Herpes 2007   hx of  . Hypertension    no current medications  . Iron deficiency anemia, unspecified 10/20/2012  . PONV (postoperative nausea and vomiting)    has had PONV with previous c/s and also lap band surgery 2004  . Seasonal allergies     Past Surgical History:  Procedure Laterality Date  . BILATERAL SALPINGECTOMY  07/16/2012   Procedure: BILATERAL DISTAL SALPINGECTOMY;  Surgeon: Logan Bores, MD;  Location: [facility];  Service: Obstetrics;;  . CESAREAN SECTION  2012  . CESAREAN SECTION N/A 07/16/2012   Procedure: REPEAT CESAREAN SECTION;  Surgeon: Logan Bores, MD;  Location: [facility];  Service: Obstetrics;  Laterality: N/A;  . CHOLECYSTECTOMY N/A 11/14/2012   Procedure: LAPAROSCOPIC CHOLECYSTECTOMY;  Surgeon: Gayland Curry, MD;  Location: [facility];  Service: General;  Laterality: N/A;  . GASTRIC BANDING PORT REVISION N/A 11/14/2012   Procedure: GASTRIC BANDING PORT REVISION;  Surgeon: Gayland Curry, MD;  Location: [facility];  Service: General;  Laterality: N/A;  PORT PLACEMENT MOVED NEW MESH INSERTED   . LAPAROSCOPIC GASTRIC BANDING  2005  . LAPAROSCOPIC VAGINAL HYSTERECTOMY WITH SALPINGECTOMY Bilateral 10/12/2015   Procedure: HYSTERECTOMY TOTAL LAPAROSCOPIC BILATERAL SALPINGECTOMY;  Surgeon: Sherlyn Hay, DO;  Location: [facility];  Service: Gynecology;  Laterality: Bilateral;  . reposition gastric band  2006   x2  . TUBAL LIGATION  2014  . WISDOM TOOTH EXTRACTION  1999    Family History  Problem Relation Age of Onset  . Hypertension Mother   . Diabetes Unknown        father's family  . Thyroid  cancer Unknown   . Breast cancer Maternal Grandmother     Social History   Socioeconomic History  . Marital status: Married    Spouse name: Not on file  . Number of children: 1  . Years of education: Not on file  . Highest education level: Not on file  Occupational History  . Occupation: Theatre manager: [employer]  . Financial resource strain: Not on file  . Food insecurity    Worry: Not on file    Inability: Not on file  . Transportation needs    Medical: Not on file    Non-medical: Not on file  Tobacco Use  . Smoking status: Never Smoker  . Smokeless tobacco: Never Used  Substance and Sexual Activity  . Alcohol use: Yes    Comment: occ  . Drug use: No  . Sexual activity: Yes    Comment: separated, 2 caffeine drinks daily.   Lifestyle  . Physical activity    Days per week: Not on file    Minutes per session: Not on file  . Stress: Not on file  Relationships  . Social Herbalist on phone: Not on file    Gets together: Not on file    Attends religious service: Not on file    Active member of club or organization: Not on file    Attends meetings of clubs or organizations: Not on file    Relationship status: Not on file  . Intimate partner violence    Fear of current or ex partner: Not on file    Emotionally abused: Not on file    Physically abused: Not on file    Forced sexual activity: Not on file  Other Topics Concern  . Not on file  Social History Narrative   2 caffeine drinks per day    Outpatient Medications Prior to Visit  Medication Sig Dispense Refill  . levocetirizine (XYZAL) 5 MG tablet Take 1 tablet (5 mg total) by mouth every evening. 90 tablet 3  . Liraglutide -Weight Management (SAXENDA) 18 MG/3ML SOPN Inject 3 mg into the skin daily. 15 mL 5  . sertraline (ZOLOFT) 50 MG tablet Take 1 tablet (50 mg total) by mouth daily. 90 tablet 1  . SUMAtriptan (IMITREX) 100 MG tablet TAKE 1 TABLET BY MOUTH EVERY 2HRS AS  NEEDED FOR MIGRAINE. (MAY REPEAT IN 2HRS IF HEADACHE PERSIST) 10 tablet 3  . traZODone (DESYREL) 50 MG tablet TAKE 1/2 OR 2 TABLETS (25-100 MG) BY MOUTH AT BEDTIME AS NEEDED FOR SLEEP. 180 tablet 1  . Insulin Pen Needle (BD PEN NEEDLE NANO U/F) 32G X 4 MM MISC USE TO  INJECT INTO SKIN DAILY AS DIRECTED 100 each 2  . cyclobenzaprine (FLEXERIL) 10 MG tablet TAKE 1 TABLET BY MOUTH AT BEDTIME AS NEEDED FOR MUSCLE SPASMS 30 tablet 0   No facility-administered medications prior to visit.     Allergies  Allergen Reactions  . Latex Itching    Pt states she gets severely itchy with latex  . Naproxen Rash    ROS Review of Systems    Objective:    Physical Exam  Constitutional: She appears well-developed and well-nourished.  HENT:  Head: Normocephalic and atraumatic.  Pulmonary/Chest: Effort normal.  Skin:  She has some acne bumps on bilateral facial cheekc. They look red and swollen.     Wt 236 lb (107 kg)   LMP 08/29/2015 (Approximate)   BMI 40.51 kg/m  Wt Readings from Last 3 Encounters:  07/07/18 236 lb (107 kg)  05/27/18 236 lb (107 kg)  12/31/17 217 lb (98.4 kg)     There are no preventive care reminders to display for this patient.  There are no preventive care reminders to display for this patient.  Lab Results  Component Value Date   TSH 1.10 12/06/2017   Lab Results  Component Value Date   WBC 6.0 07/13/2017   HGB 13.3 07/13/2017   HCT 40.3 07/13/2017   MCV 80.4 07/13/2017   PLT 291 07/13/2017   Lab Results  Component Value Date   NA 139 07/13/2017   K 4.1 07/13/2017   CO2 26 07/13/2017   GLUCOSE 108 (H) 07/13/2017   BUN 7 07/13/2017   CREATININE 0.72 07/13/2017   BILITOT 1.4 (H) 10/13/2015   ALKPHOS 46 10/13/2015   AST 18 10/13/2015   ALT 12 (L) 10/13/2015   PROT 5.8 (L) 10/13/2015   ALBUMIN 3.1 (L) 10/13/2015   CALCIUM 8.8 (L) 07/13/2017   ANIONGAP 5 07/13/2017   Lab Results  Component Value Date   CHOL 133 06/15/2015   Lab Results   Component Value Date   HDL 44 (L) 06/15/2015   Lab Results  Component Value Date   LDLCALC 79 06/15/2015   Lab Results  Component Value Date   TRIG 51 06/15/2015   Lab Results  Component Value Date   CHOLHDL 3.0 06/15/2015   No results found for: HGBA1C    Assessment & Plan:   Problem List Items Addressed This Visit    None    Visit Diagnoses    Abnormal weight gain    -  Primary   Relevant Orders   COMPLETE METABOLIC PANEL WITH GFR   Lipid panel   Hemoglobin A1c   TSH   Estradiol   Follicle stimulating hormone   Progesterone   CBC   Cortisol   Acne vulgaris       Relevant Medications   tretinoin microspheres (RETIN-A MICRO) 0.04 % gel   Other Relevant Orders   COMPLETE METABOLIC PANEL WITH GFR   Lipid panel   Hemoglobin A1c   TSH   Estradiol   Follicle stimulating hormone   Progesterone   CBC   Cortisol   Screening, lipid       Relevant Orders   Lipid panel   Acute pain of left knee         Left knee pain - likely chondromalacia patella. wil mail her exercises based on her description of pain.   Abnormal weight gain-discussed options.  Because she has had significant weight gain over the last year as well as new onset acne over the  last 4 months and go to go ahead and do some additional blood work today.  If everything is normal then discussed the option of getting her in with the [facility]  and possibly even considering phentermine for a couple of months.  Acne - will tx with retin-A.  Check thyroid and hormones to see what may be causing it.    Meds ordered this encounter  Medications  . Insulin Pen Needle (BD PEN NEEDLE NANO U/F) 32G X 4 MM MISC    Sig: USE TO INJECT INTO SKIN DAILY AS DIRECTED    Dispense:  100 each    Refill:  2  . tretinoin microspheres (RETIN-A MICRO) 0.04 % gel    Sig: Apply topically at bedtime.    Dispense:  45 g    Refill:  0    Follow-up: Return in about 4 weeks (around 08/04/2018), or recheck weight and acne..     I discussed the assessment and treatment plan with the patient. The patient was provided an opportunity to ask questions and all were answered. The patient agreed with the plan and demonstrated an understanding of the instructions.   The patient was advised to call back or seek an in-person evaluation if the symptoms worsen or if the condition fails to improve as anticipated.   Beatrice Lecher, MD  Beatrice Lecher, MD

## 2018-07-09 DIAGNOSIS — L700 Acne vulgaris: Secondary | ICD-10-CM | POA: Diagnosis not present

## 2018-07-09 DIAGNOSIS — R635 Abnormal weight gain: Secondary | ICD-10-CM | POA: Diagnosis not present

## 2018-07-09 DIAGNOSIS — Z13220 Encounter for screening for lipoid disorders: Secondary | ICD-10-CM | POA: Diagnosis not present

## 2018-07-11 LAB — COMPLETE METABOLIC PANEL WITH GFR
AG Ratio: 1.5 (calc) (ref 1.0–2.5)
ALT: 10 U/L (ref 6–29)
AST: 15 U/L (ref 10–30)
Albumin: 3.8 g/dL (ref 3.6–5.1)
Alkaline phosphatase (APISO): 66 U/L (ref 31–125)
BUN: 11 mg/dL (ref 7–25)
CO2: 27 mmol/L (ref 20–32)
Calcium: 9.2 mg/dL (ref 8.6–10.2)
Chloride: 105 mmol/L (ref 98–110)
Creat: 0.7 mg/dL (ref 0.50–1.10)
GFR, Est African American: 125 mL/min/{1.73_m2} (ref >=60)
GFR, Est Non African American: 108 mL/min/{1.73_m2} (ref >=60)
Globulin: 2.6 g/dL (calc) (ref 1.9–3.7)
Glucose, Bld: 106 mg/dL — ABNORMAL HIGH (ref 65–99)
Potassium: 4.1 mmol/L (ref 3.5–5.3)
Sodium: 139 mmol/L (ref 135–146)
Total Bilirubin: 0.4 mg/dL (ref 0.2–1.2)
Total Protein: 6.4 g/dL (ref 6.1–8.1)

## 2018-07-11 LAB — CORTISOL: Cortisol, Plasma: 14.9 ug/dL

## 2018-07-11 LAB — CBC
HCT: 38.9 % (ref 35.0–45.0)
Hemoglobin: 12.7 g/dL (ref 11.7–15.5)
MCH: 26.7 pg — ABNORMAL LOW (ref 27.0–33.0)
MCHC: 32.6 g/dL (ref 32.0–36.0)
MCV: 81.9 fL (ref 80.0–100.0)
MPV: 10.1 fL (ref 7.5–12.5)
Platelets: 278 10*3/uL (ref 140–400)
RBC: 4.75 10*6/uL (ref 3.80–5.10)
RDW: 13.8 % (ref 11.0–15.0)
WBC: 8 10*3/uL (ref 3.8–10.8)

## 2018-07-11 LAB — HEMOGLOBIN A1C
Hgb A1c MFr Bld: 5.6 % of total Hgb (ref <5.7)
Mean Plasma Glucose: 114 (calc)
eAG (mmol/L): 6.3 (calc)

## 2018-07-11 LAB — LIPID PANEL
Cholesterol: 171 mg/dL (ref <200)
HDL: 57 mg/dL (ref >=50)
LDL Cholesterol (Calc): 99 mg/dL (calc)
Non-HDL Cholesterol (Calc): 114 mg/dL (calc) (ref <130)
Total CHOL/HDL Ratio: 3 (calc) (ref <5.0)
Triglycerides: 60 mg/dL (ref <150)

## 2018-07-11 LAB — FOLLICLE STIMULATING HORMONE: FSH: 9.7 m[IU]/mL

## 2018-07-11 LAB — TSH: TSH: 0.78 mIU/L

## 2018-07-11 LAB — PROGESTERONE: Progesterone: 2.7 ng/mL

## 2018-07-11 LAB — ESTRADIOL: Estradiol: 53 pg/mL

## 2018-07-15 ENCOUNTER — Encounter: Payer: Self-pay | Admitting: Family Medicine

## 2018-07-15 MED ORDER — PHENTERMINE HCL 15 MG PO CAPS: 15.0000 mg | ORAL_CAPSULE | ORAL | 0 refills | Status: DC

## 2018-07-19 ENCOUNTER — Other Ambulatory Visit: Payer: Self-pay | Admitting: Family Medicine

## 2018-08-07 ENCOUNTER — Ambulatory Visit (INDEPENDENT_AMBULATORY_CARE_PROVIDER_SITE_OTHER): Payer: 59 | Admitting: Sports Medicine

## 2018-08-07 ENCOUNTER — Other Ambulatory Visit: Payer: Self-pay

## 2018-08-07 ENCOUNTER — Encounter: Payer: Self-pay | Admitting: Sports Medicine

## 2018-08-07 DIAGNOSIS — L219 Seborrheic dermatitis, unspecified: Secondary | ICD-10-CM

## 2018-08-07 DIAGNOSIS — R21 Rash and other nonspecific skin eruption: Secondary | ICD-10-CM

## 2018-08-07 MED ORDER — KETOCONAZOLE 2 % EX SHAM: MEDICATED_SHAMPOO | CUTANEOUS | 11 refills | Status: DC

## 2018-08-07 MED ORDER — PREDNISONE 50 MG PO TABS: ORAL_TABLET | ORAL | 0 refills | Status: DC

## 2018-08-07 MED ORDER — CLOTRIMAZOLE-BETAMETHASONE 1-0.05 % EX CREA: 1.0000 "application " | TOPICAL_CREAM | Freq: Two times a day (BID) | CUTANEOUS | 0 refills | Status: DC

## 2018-08-07 NOTE — Assessment & Plan Note (Signed)
Adding topical ketoconazole shampoo.

## 2018-08-07 NOTE — Progress Notes (Signed)
Subjective:    CC: Skin rash  HPI: Over the past weekend this pleasant 43 year old female has noted rashes in her right anterior elbow, flexural folds of her right abdomen, neck, hands, as well as an itchy scalp.  She does have significant scaling in the scalp.  Symptoms are moderate, persistent, no changes in hygiene agents, no constitutional symptoms, no GI symptoms.  No sick contacts.  I reviewed the past medical history, family history, social history, surgical history, and allergies today and no changes were needed.  Please see the problem list section below in epic for further details.  Past Medical History: Past Medical History:  Diagnosis Date  . Anemia    Has sickle cell trait  . GERD (gastroesophageal reflux disease)    diet controlled  . Gestational diabetes   . H/O vaginal delivery 2010  . Headache(784.0)    Migraines  . Herpes 2007   hx of  . Hypertension    no current medications  . Iron deficiency anemia, unspecified 10/20/2012  . PONV (postoperative nausea and vomiting)    has had PONV with previous c/s and also lap band surgery 2004  . Seasonal allergies    Past Surgical History: Past Surgical History:  Procedure Laterality Date  . BILATERAL SALPINGECTOMY  07/16/2012   Procedure: BILATERAL DISTAL SALPINGECTOMY;  Surgeon: Logan Bores, MD;  Location: [facility];  Service: Obstetrics;;  . CESAREAN SECTION  2012  . CESAREAN SECTION N/A 07/16/2012   Procedure: REPEAT CESAREAN SECTION;  Surgeon: Logan Bores, MD;  Location: [facility];  Service: Obstetrics;  Laterality: N/A;  . CHOLECYSTECTOMY N/A 11/14/2012   Procedure: LAPAROSCOPIC CHOLECYSTECTOMY;  Surgeon: Gayland Curry, MD;  Location: [facility];  Service: General;  Laterality: N/A;  . GASTRIC BANDING PORT REVISION N/A 11/14/2012   Procedure: GASTRIC BANDING PORT REVISION;  Surgeon: Gayland Curry, MD;  Location: [facility];  Service: General;  Laterality: N/A;  PORT PLACEMENT MOVED NEW MESH INSERTED   .  LAPAROSCOPIC GASTRIC BANDING  2005  . LAPAROSCOPIC VAGINAL HYSTERECTOMY WITH SALPINGECTOMY Bilateral 10/12/2015   Procedure: HYSTERECTOMY TOTAL LAPAROSCOPIC BILATERAL SALPINGECTOMY;  Surgeon: Sherlyn Hay, DO;  Location: [facility];  Service: Gynecology;  Laterality: Bilateral;  . reposition gastric band  2006   x2  . TUBAL LIGATION  2014  . WISDOM TOOTH EXTRACTION  1999   Social History: Social History   Socioeconomic History  . Marital status: Married    Spouse name: Not on file  . Number of children: 1  . Years of education: Not on file  . Highest education level: Not on file  Occupational History  . Occupation: Theatre manager: [employer]  . Financial resource strain: Not on file  . Food insecurity    Worry: Not on file    Inability: Not on file  . Transportation needs    Medical: Not on file    Non-medical: Not on file  Tobacco Use  . Smoking status: Never Smoker  . Smokeless tobacco: Never Used  Substance and Sexual Activity  . Alcohol use: Yes    Comment: occ  . Drug use: No  . Sexual activity: Yes    Comment: separated, 2 caffeine drinks daily.   Lifestyle  . Physical activity    Days per week: Not on file    Minutes per session: Not on file  . Stress: Not on file  Relationships  . Social Herbalist on phone:  Not on file    Gets together: Not on file    Attends religious service: Not on file    Active member of club or organization: Not on file    Attends meetings of clubs or organizations: Not on file    Relationship status: Not on file  Other Topics Concern  . Not on file  Social History Narrative   2 caffeine drinks per day   Family History: Family History  Problem Relation Age of Onset  . Hypertension Mother   . Diabetes Unknown        father's family  . Thyroid cancer Unknown   . Breast cancer Maternal Grandmother    Allergies: Allergies  Allergen Reactions  . Latex Itching    Pt states she gets  severely itchy with latex  . Naproxen Rash   Medications: See med rec.  Review of Systems: No fevers, chills, night sweats, weight loss, chest pain, or shortness of breath.   Objective:    General: Well Developed, well nourished, and in no acute distress.  Neuro: Alert and oriented x3, extra-ocular muscles intact, sensation grossly intact.  HEENT: Normocephalic, atraumatic, pupils equal round reactive to light, neck supple, no masses, no lymphadenopathy, thyroid nonpalpable.  Skin: Warm and dry, papular, erythematous rash in the right flexural fold of the elbow, skin folds of the right abdomen, neck looks unremarkable.  There is some scalp seborrhea. Cardiac: Regular rate and rhythm, no murmurs rubs or gallops, no lower extremity edema.  Respiratory: Clear to auscultation bilaterally. Not using accessory muscles, speaking in full sentences.  Impression and Recommendations:    Skin rash Unclear etiology, this is occurring more in the flexural skin folds raising the likelihood of intertrigo. Eczema can present similarly, adding topical Lotrisone. 5 days of prednisone.   Seborrheic dermatitis of scalp Adding topical ketoconazole shampoo.   ___________________________________________ Gwen Her. Dianah Field, M.D., ABFM., CAQSM. Primary Care and Sports Medicine [facility]  Adjunct Professor of [employer]

## 2018-08-07 NOTE — Assessment & Plan Note (Signed)
Unclear etiology, this is occurring more in the flexural skin folds raising the likelihood of intertrigo. Eczema can present similarly, adding topical Lotrisone. 5 days of prednisone.

## 2018-08-14 ENCOUNTER — Other Ambulatory Visit: Payer: Self-pay | Admitting: Family Medicine

## 2018-08-22 ENCOUNTER — Ambulatory Visit (INDEPENDENT_AMBULATORY_CARE_PROVIDER_SITE_OTHER): Payer: 59 | Admitting: Family Medicine

## 2018-08-22 ENCOUNTER — Encounter: Payer: Self-pay | Admitting: Family Medicine

## 2018-08-22 ENCOUNTER — Other Ambulatory Visit: Payer: Self-pay

## 2018-08-22 VITALS — Ht 64.0 in | Wt 247.0 lb

## 2018-08-22 DIAGNOSIS — L219 Seborrheic dermatitis, unspecified: Secondary | ICD-10-CM | POA: Diagnosis not present

## 2018-08-22 DIAGNOSIS — R635 Abnormal weight gain: Secondary | ICD-10-CM | POA: Diagnosis not present

## 2018-08-22 DIAGNOSIS — R21 Rash and other nonspecific skin eruption: Secondary | ICD-10-CM

## 2018-08-22 MED ORDER — PHENTERMINE HCL 37.5 MG PO CAPS: 37.5000 mg | ORAL_CAPSULE | ORAL | 0 refills | Status: DC

## 2018-08-22 MED ORDER — CLOTRIMAZOLE-BETAMETHASONE 1-0.05 % EX CREA: 1.0000 "application " | TOPICAL_CREAM | Freq: Two times a day (BID) | CUTANEOUS | 0 refills | Status: DC

## 2018-08-22 NOTE — Assessment & Plan Note (Addendum)
We will bump up phentermine dose to 32.5 mg.  Follow-up in 1 month.  She feels like she is made some positive dietary changes just continue with diet and exercise.  We will continue with the [place] as well.

## 2018-08-22 NOTE — Progress Notes (Signed)
Virtual Visit via Video Note  I connected with Jamie Mccarthy on 08/22/18 at  4:00 PM EDT by a video enabled telemedicine application and verified that I am speaking with the correct person using two identifiers.   I discussed the limitations of evaluation and management by telemedicine and the availability of in person appointments. The patient expressed understanding and agreed to proceed.  Pt was at home and I was in my office for the virtual visit.     Established Patient Office Visit  Subjective:  Patient ID: Jamie Mccarthy, female    DOB: Dec 16, 1975  Age: 43 y.o. MRN: [id number]  CC:  Chief Complaint  Patient presents with  . abnormal weight gain  . Pruritis    no changes in lotions,soaps,detergents     HPI Jamie Mccarthy presents for   CC: Follow-up phentermine for weight loss.  HPI: Follow-up abnormal weight gain-we had sent a prescription for phentermine 15 mg last time I saw her.  She says she felt like it was somewhat helpful but did not really lose any additional weight it just is plateaued but she has not gained.  She wonders if she could try the higher strength phentermine and see if that works better she is really made some dietary changes and feels good about that and says overall physically she feels really well.  She has tried to be more active and exercise as well.  She felt like the Zoloft was making it hard for her to lose weight, but she has been off of it for a while now and is still struggling.  Also to follow-up in a couple dermatologic issues that she actually saw my partners for.  She was having some scalp itching and irritation and flaking and was given a prescription for ketoconazole shampoo.  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  She is still struggling with itchy skin.  She said is that it will start to itch and then she scratches at it and then it welts most like hives and then gets more itchy.  She says it  is mostly affecting her arms and legs and under her bra area anywhere where there is tighter fitting clothing.  She denies any new soaps, detergents perfumes, lotions, fabric softeners etc.  She does moisturize regularly.  She is currently on Xyzal.  She did take the prednisone and felt like initially it was helping but then felt like it was not really doing much but she did complete the full 5 days.  He does not feel like the topical Lotrisone cream has been helpful.   Past Medical History:  Diagnosis Date  . Anemia    Has sickle cell trait  . GERD (gastroesophageal reflux disease)    diet controlled  . Gestational diabetes   . H/O vaginal delivery 2010  . Headache(784.0)    Migraines  . Herpes 2007   hx of  . Hypertension    no current medications  . Iron deficiency anemia, unspecified 10/20/2012  . PONV (postoperative nausea and vomiting)    has had PONV with previous c/s and also lap band surgery 2004  . Seasonal allergies     Past Surgical History:  Procedure Laterality Date  . BILATERAL SALPINGECTOMY  07/16/2012   Procedure: BILATERAL DISTAL SALPINGECTOMY;  Surgeon: Logan Bores, MD;  Location: [facility];  Service: Obstetrics;;  . CESAREAN SECTION  2012  . CESAREAN SECTION N/A 07/16/2012   Procedure:  REPEAT CESAREAN SECTION;  Surgeon: Logan Bores, MD;  Location: [facility];  Service: Obstetrics;  Laterality: N/A;  . CHOLECYSTECTOMY N/A 11/14/2012   Procedure: LAPAROSCOPIC CHOLECYSTECTOMY;  Surgeon: Gayland Curry, MD;  Location: [facility];  Service: General;  Laterality: N/A;  . GASTRIC BANDING PORT REVISION N/A 11/14/2012   Procedure: GASTRIC BANDING PORT REVISION;  Surgeon: Gayland Curry, MD;  Location: [facility];  Service: General;  Laterality: N/A;  PORT PLACEMENT MOVED NEW MESH INSERTED   . LAPAROSCOPIC GASTRIC BANDING  2005  . LAPAROSCOPIC VAGINAL HYSTERECTOMY WITH SALPINGECTOMY Bilateral 10/12/2015   Procedure: HYSTERECTOMY TOTAL LAPAROSCOPIC BILATERAL SALPINGECTOMY;   Surgeon: Sherlyn Hay, DO;  Location: [facility];  Service: Gynecology;  Laterality: Bilateral;  . reposition gastric band  2006   x2  . TUBAL LIGATION  2014  . WISDOM TOOTH EXTRACTION  1999    Family History  Problem Relation Age of Onset  . Hypertension Mother   . Diabetes Unknown        father's family  . Thyroid cancer Unknown   . Breast cancer Maternal Grandmother     Social History   Socioeconomic History  . Marital status: Married    Spouse name: Not on file  . Number of children: 1  . Years of education: Not on file  . Highest education level: Not on file  Occupational History  . Occupation: Theatre manager: [employer]  . Financial resource strain: Not on file  . Food insecurity    Worry: Not on file    Inability: Not on file  . Transportation needs    Medical: Not on file    Non-medical: Not on file  Tobacco Use  . Smoking status: Never Smoker  . Smokeless tobacco: Never Used  Substance and Sexual Activity  . Alcohol use: Yes    Comment: occ  . Drug use: No  . Sexual activity: Yes    Comment: separated, 2 caffeine drinks daily.   Lifestyle  . Physical activity    Days per week: Not on file    Minutes per session: Not on file  . Stress: Not on file  Relationships  . Social Herbalist on phone: Not on file    Gets together: Not on file    Attends religious service: Not on file    Active member of club or organization: Not on file    Attends meetings of clubs or organizations: Not on file    Relationship status: Not on file  . Intimate partner violence    Fear of current or ex partner: Not on file    Emotionally abused: Not on file    Physically abused: Not on file    Forced sexual activity: Not on file  Other Topics Concern  . Not on file  Social History Narrative   2 caffeine drinks per day    Outpatient Medications Prior to Visit  Medication Sig Dispense Refill  . Insulin Pen Needle (BD PEN NEEDLE  NANO U/F) 32G X 4 MM MISC USE TO INJECT INTO SKIN DAILY AS DIRECTED 100 each 2  . ketoconazole (NIZORAL) 2 % shampoo Apply to scalp twice a week for 8 weeks and then weekly thereafter 120 mL 11  . levocetirizine (XYZAL) 5 MG tablet TAKE 1 TABLET BY MOUTH EVERY DAY IN THE EVENING 90 tablet 1  . Liraglutide -Weight Management (SAXENDA) 18 MG/3ML SOPN Inject 3 mg into the skin daily.  15 mL 5  . SUMAtriptan (IMITREX) 100 MG tablet TAKE 1 TABLET BY MOUTH EVERY 2HRS AS NEEDED FOR MIGRAINE. (MAY REPEAT IN 2HRS IF HEADACHE PERSIST) 10 tablet 3  . traZODone (DESYREL) 50 MG tablet TAKE 1/2 OR 2 TABLETS (25-100 MG) BY MOUTH AT BEDTIME AS NEEDED FOR SLEEP. 180 tablet 1  . tretinoin microspheres (RETIN-A MICRO) 0.04 % gel APPLY TO AFFECTED AREA EVERY DAY AT BEDTIME 45 g 0  . clotrimazole-betamethasone (LOTRISONE) cream Apply 1 application topically 2 (two) times daily. 45 g 0  . phentermine 15 MG capsule Take 1 capsule (15 mg total) by mouth every morning. 30 capsule 0  . predniSONE (DELTASONE) 50 MG tablet One tab PO daily for 5 days. 5 tablet 0  . sertraline (ZOLOFT) 50 MG tablet Take 1 tablet (50 mg total) by mouth daily. 90 tablet 1   No facility-administered medications prior to visit.     Allergies  Allergen Reactions  . Latex Itching    Pt states she gets severely itchy with latex  . Naproxen Rash    ROS Review of Systems    Objective:    Physical Exam  No acute distress.  No increased work of breathing.  Well-groomed.  Aching in complete sentences.  Ht 5\' 4"  (1.626 m)   Wt 247 lb (112 kg)   LMP 08/29/2015 (Approximate)   BMI 42.40 kg/m  Wt Readings from Last 3 Encounters:  08/22/18 247 lb (112 kg)  08/07/18 247 lb (112 kg)  07/07/18 236 lb (107 kg)     There are no preventive care reminders to display for this patient.  There are no preventive care reminders to display for this patient.  Lab Results  Component Value Date   TSH 0.78 07/09/2018   Lab Results  Component  Value Date   WBC 8.0 07/09/2018   HGB 12.7 07/09/2018   HCT 38.9 07/09/2018   MCV 81.9 07/09/2018   PLT 278 07/09/2018   Lab Results  Component Value Date   NA 139 07/09/2018   K 4.1 07/09/2018   CO2 27 07/09/2018   GLUCOSE 106 (H) 07/09/2018   BUN 11 07/09/2018   CREATININE 0.70 07/09/2018   BILITOT 0.4 07/09/2018   ALKPHOS 46 10/13/2015   AST 15 07/09/2018   ALT 10 07/09/2018   PROT 6.4 07/09/2018   ALBUMIN 3.1 (L) 10/13/2015   CALCIUM 9.2 07/09/2018   ANIONGAP 5 07/13/2017   Lab Results  Component Value Date   CHOL 171 07/09/2018   Lab Results  Component Value Date   HDL 57 07/09/2018   Lab Results  Component Value Date   LDLCALC 99 07/09/2018   Lab Results  Component Value Date   TRIG 60 07/09/2018   Lab Results  Component Value Date   CHOLHDL 3.0 07/09/2018   Lab Results  Component Value Date   HGBA1C 5.6 07/09/2018      Assessment & Plan:   Problem List Items Addressed This Visit      Musculoskeletal and Integument   Skin rash    Possible chronic urticaria.  Discussed that it can come and go.  Is usually very sensitive to scratching and often times will call as well and the skin is in hives to break out where it is itching.  The main treatment is high-dose antihistamines.  Okay to doing discontinue the Lotrisone since it is not really helping.  Will increase Xyzal to twice a day and okay to add Benadryl at bedtime if needed.  Explained that it is also very difficult to figure out the potential triggers.      Relevant Medications   clotrimazole-betamethasone (LOTRISONE) cream   Seborrheic dermatitis of scalp - Primary    Doing really well with the ketoconazole shampoo.  Has plenty of refills.        Other   Abnormal weight gain    We will bump up phentermine dose to 32.5 mg.  Follow-up in 1 month.  She feels like she is made some positive dietary changes just continue with diet and exercise.  We will continue with the [place] as well.          Meds ordered this encounter  Medications  . clotrimazole-betamethasone (LOTRISONE) cream    Sig: Apply 1 application topically 2 (two) times daily.    Dispense:  45 g    Refill:  0  . phentermine 37.5 MG capsule    Sig: Take 1 capsule (37.5 mg total) by mouth every morning.    Dispense:  30 capsule    Refill:  0    Follow-up: No follow-ups on file.     I discussed the assessment and treatment plan with the patient. The patient was provided an opportunity to ask questions and all were answered. The patient agreed with the plan and demonstrated an understanding of the instructions.   The patient was advised to call back or seek an in-person evaluation if the symptoms worsen or if the condition fails to improve as anticipated.   Beatrice Lecher, MD

## 2018-08-22 NOTE — Assessment & Plan Note (Signed)
Doing really well with the ketoconazole shampoo.  Has plenty of refills.

## 2018-08-22 NOTE — Assessment & Plan Note (Signed)
Possible chronic urticaria.  Discussed that it can come and go.  Is usually very sensitive to scratching and often times will call as well and the skin is in hives to break out where it is itching.  The main treatment is high-dose antihistamines.  Okay to doing discontinue the Lotrisone since it is not really helping.  Will increase Xyzal to twice a day and okay to add Benadryl at bedtime if needed.  Explained that it is also very difficult to figure out the potential triggers.

## 2018-08-22 NOTE — Progress Notes (Signed)
She reports that she has made change to her diet and has been working out and she has also returned to work. She feels that she is at a plateau and nothing is moving despite all of her efforts.   She also c/o itching all over. She was seen by Dr. Darene Lamer for this. She stated that if she itches in one place she begins to itch all over.Maryruth Eve, [name], CMA

## 2018-09-02 ENCOUNTER — Ambulatory Visit: Payer: 59 | Admitting: Sports Medicine

## 2018-09-04 ENCOUNTER — Ambulatory Visit: Payer: 59 | Admitting: Sports Medicine

## 2018-10-14 LAB — HM MAMMOGRAPHY

## 2018-11-14 ENCOUNTER — Encounter: Payer: Self-pay | Admitting: Family Medicine

## 2018-11-14 ENCOUNTER — Ambulatory Visit (INDEPENDENT_AMBULATORY_CARE_PROVIDER_SITE_OTHER): Payer: 59 | Admitting: Family Medicine

## 2018-11-14 ENCOUNTER — Other Ambulatory Visit: Payer: Self-pay

## 2018-11-14 ENCOUNTER — Ambulatory Visit (INDEPENDENT_AMBULATORY_CARE_PROVIDER_SITE_OTHER): Payer: 59

## 2018-11-14 VITALS — BP 130/88 | HR 84 | Ht 64.0 in

## 2018-11-14 DIAGNOSIS — M25562 Pain in left knee: Secondary | ICD-10-CM | POA: Diagnosis not present

## 2018-11-14 DIAGNOSIS — M25462 Effusion, left knee: Secondary | ICD-10-CM | POA: Diagnosis not present

## 2018-11-14 DIAGNOSIS — Z23 Encounter for immunization: Secondary | ICD-10-CM | POA: Diagnosis not present

## 2018-11-14 DIAGNOSIS — R635 Abnormal weight gain: Secondary | ICD-10-CM | POA: Diagnosis not present

## 2018-11-14 DIAGNOSIS — A6000 Herpesviral infection of urogenital system, unspecified: Secondary | ICD-10-CM | POA: Insufficient documentation

## 2018-11-14 IMAGING — DX DG KNEE COMPLETE 4+V*L*
4 series · 4 of 4 positions shown · non-contrast
Comparison: None.

CLINICAL DATA: Knee pain

EXAM:
LEFT KNEE - COMPLETE 4+ VIEW

[knee ap]
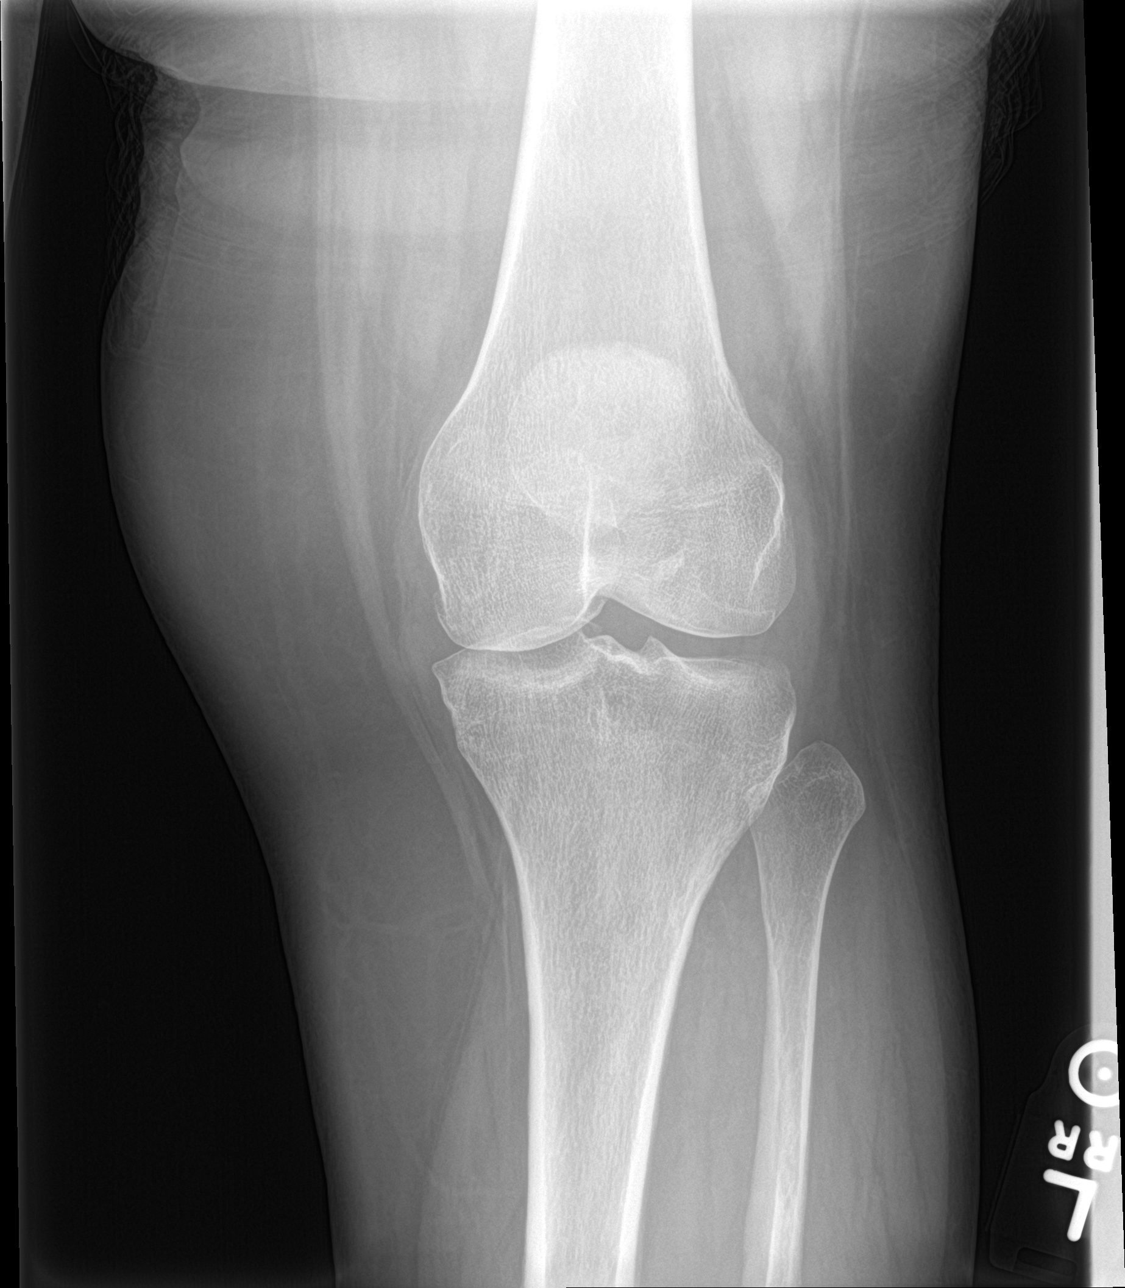

[knee lat]
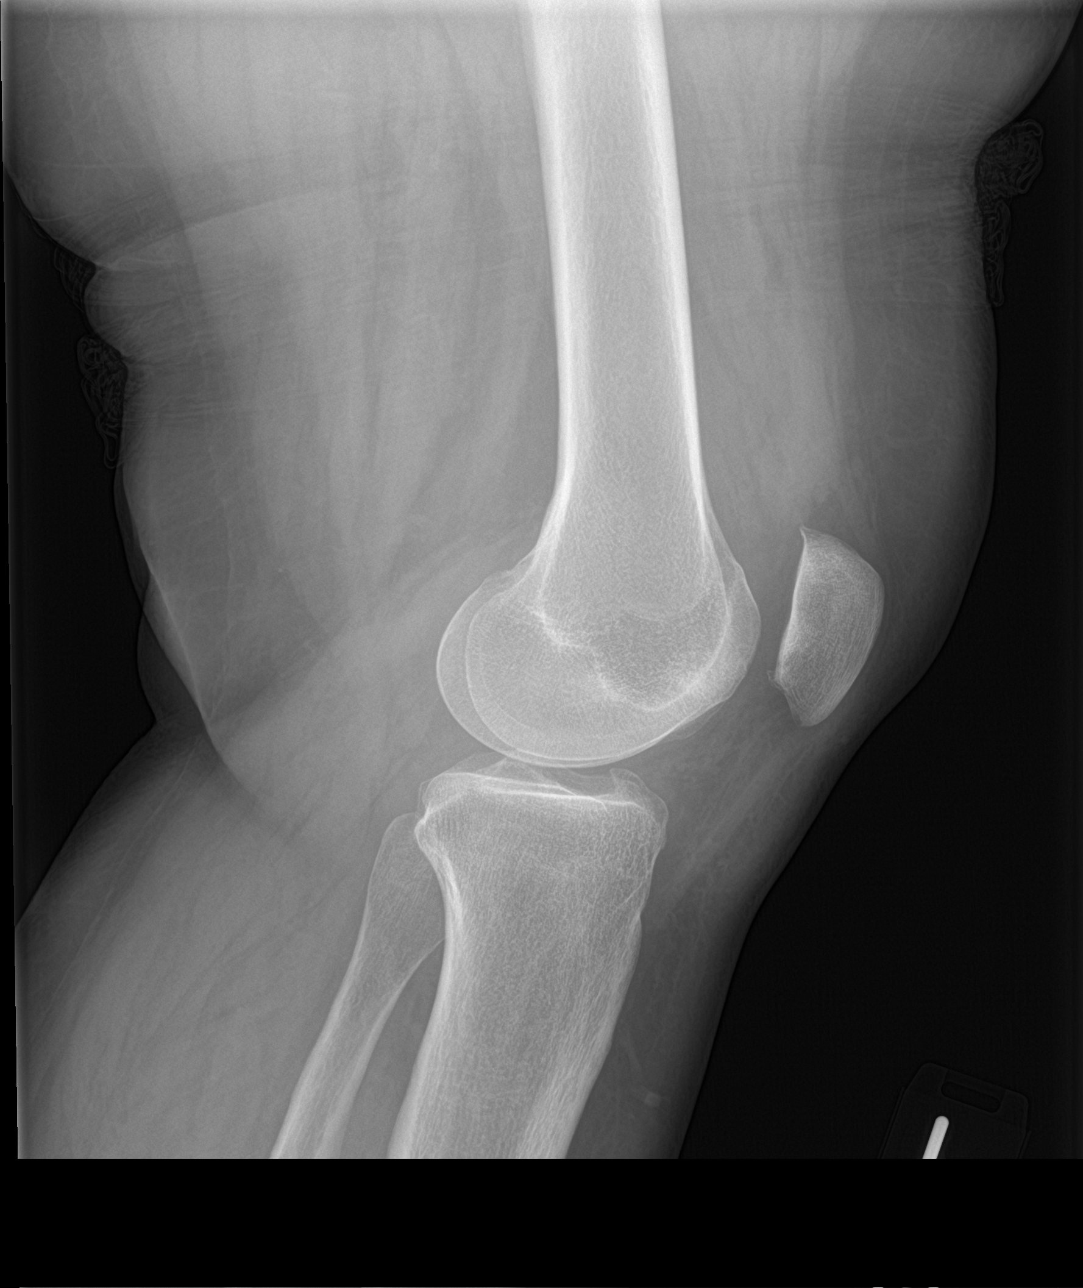

[knee obl (1 of 2)]
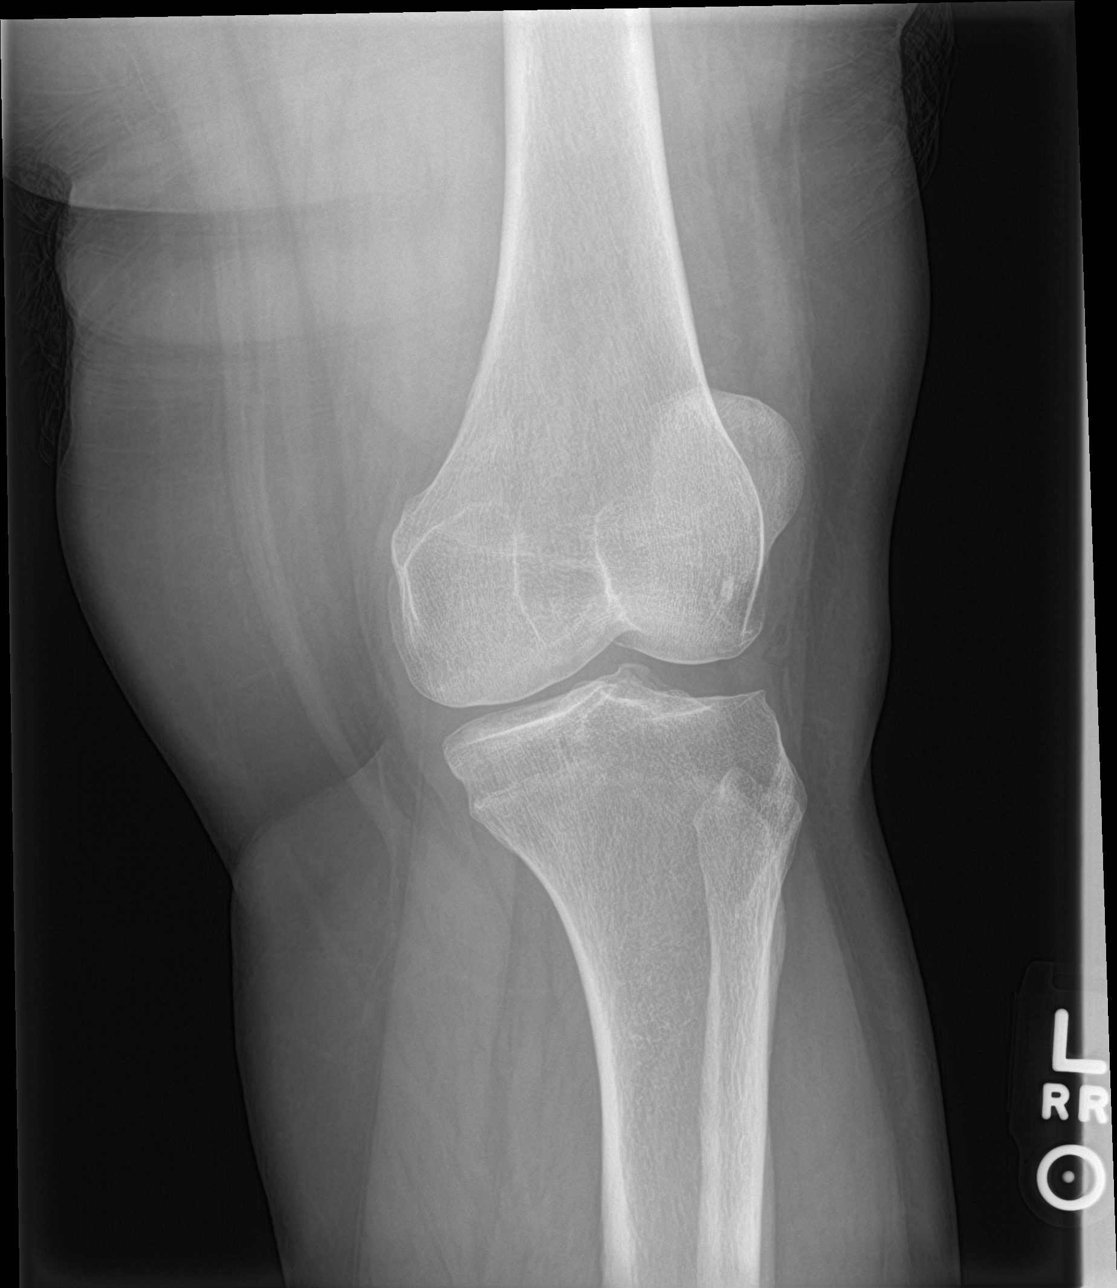

[knee obl (2 of 2)]
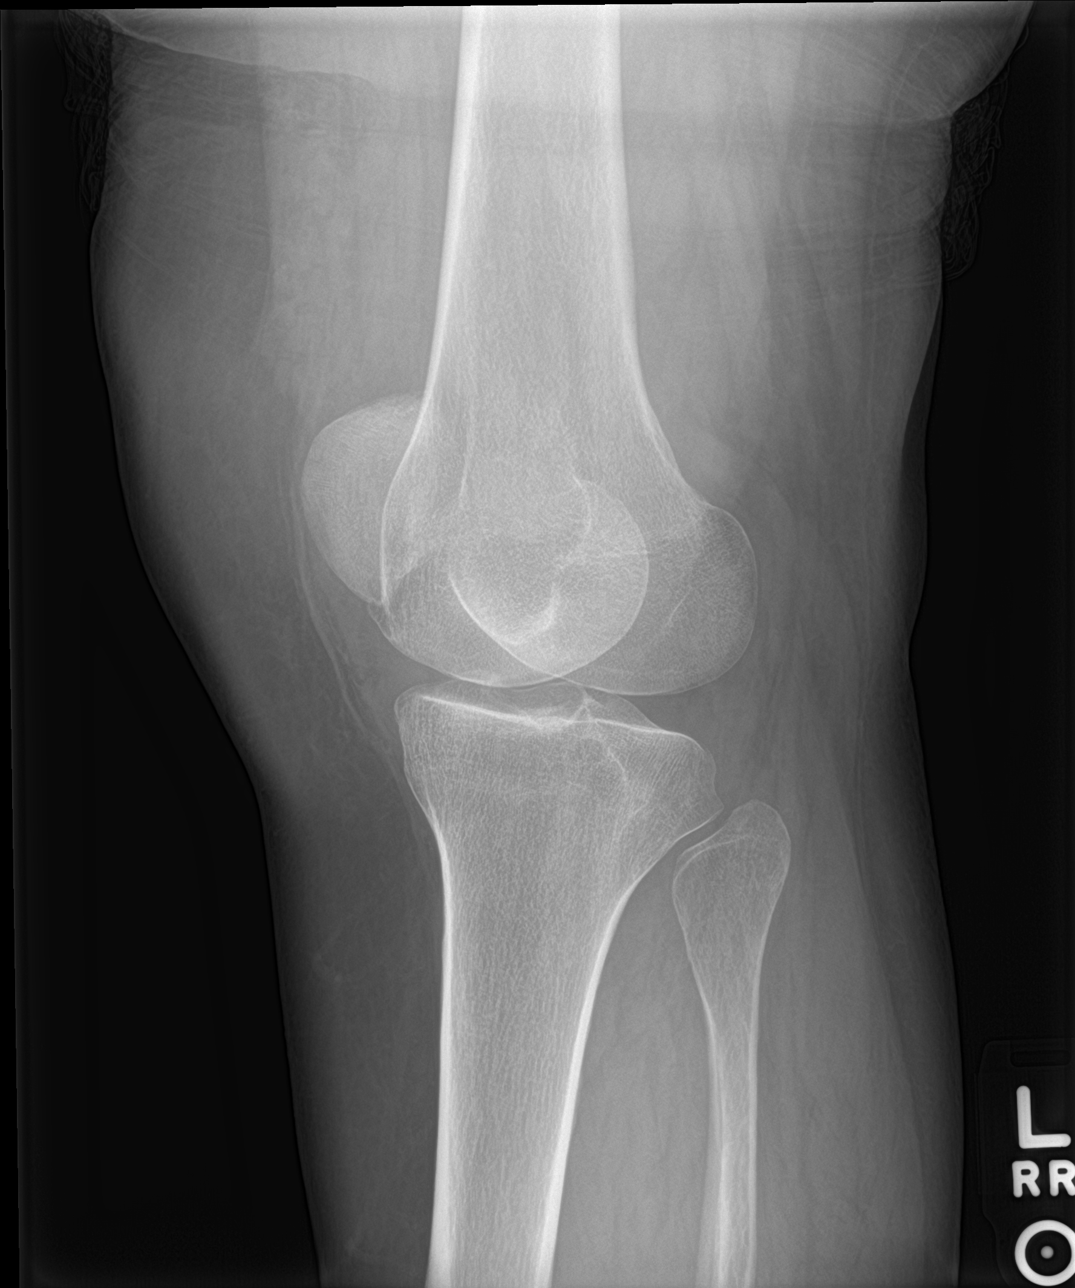

[4 of 4 positions shown; findings below may reference images not displayed]

FINDINGS: No fracture or malalignment. Mild patellofemoral degenerative
change. Small knee effusion.
IMPRESSION: Mild arthritis with knee effusion.  No acute osseous abnormality

## 2018-11-14 MED ORDER — SAXENDA 18 MG/3ML ~~LOC~~ SOPN: 3.0000 mg | PEN_INJECTOR | Freq: Every day | SUBCUTANEOUS | 5 refills | Status: DC

## 2018-11-14 NOTE — Progress Notes (Signed)
Acute Office Visit  Subjective:    Patient ID: Jamie Mccarthy, female    DOB: September 19, 1975, 43 y.o.   MRN: [id number]  Chief Complaint  Patient presents with  . Knee Pain    x6 months she states that "it just hurts",she is unable to bend it. denies any injury/trauma. she has been doing stretches, and using topical txt and a brace, she said that it throbs and burns constantly    HPI Patient is in today for Left knee pain x 6 months.  Throbs and burns at time. Has been getting gradually worse. Now wearing an OTC knee bruise. Painful to bend/flex it. No trauma or injury.  She feels like it stays chronically swollen.  Sometimes even has to just lift her leg up to get in and out of the car.  She has gained some weight since she was furloughed from her job but now is back at work.  She thought maybe when she started moving more it would actually get better but has been gradually getting worse.  She is tried Tylenol, ibuprofen.  She even bought some bands and started to do some rehab type stretches without any significant benefit.  More recently she has been wearing a knee sleeve and using a mentholated topical agent.  Also wanted to discuss her weight.  She did actually come off of the [place] because felt like it was not really working any longer.  She said when they went to check her port they drained it and then put the saline back in and it just does not seem to have worked well since then.  She says she does not any limitation on the amount that she eats.  She recently restarted the [place] she still had some leftover and so she is actually already lost a couple pounds and so she would like a refill on the medication.  Past Medical History:  Diagnosis Date  . Anemia    Has sickle cell trait  . GERD (gastroesophageal reflux disease)    diet controlled  . Gestational diabetes   . H/O vaginal delivery 2010  . Headache(784.0)    Migraines  . Herpes 2007   hx of  . Hypertension    no current  medications  . Iron deficiency anemia, unspecified 10/20/2012  . PONV (postoperative nausea and vomiting)    has had PONV with previous c/s and also lap band surgery 2004  . Seasonal allergies     Past Surgical History:  Procedure Laterality Date  . BILATERAL SALPINGECTOMY  07/16/2012   Procedure: BILATERAL DISTAL SALPINGECTOMY;  Surgeon: Logan Bores, MD;  Location: [facility];  Service: Obstetrics;;  . CESAREAN SECTION  2012  . CESAREAN SECTION N/A 07/16/2012   Procedure: REPEAT CESAREAN SECTION;  Surgeon: Logan Bores, MD;  Location: [facility];  Service: Obstetrics;  Laterality: N/A;  . CHOLECYSTECTOMY N/A 11/14/2012   Procedure: LAPAROSCOPIC CHOLECYSTECTOMY;  Surgeon: Gayland Curry, MD;  Location: [facility];  Service: General;  Laterality: N/A;  . GASTRIC BANDING PORT REVISION N/A 11/14/2012   Procedure: GASTRIC BANDING PORT REVISION;  Surgeon: Gayland Curry, MD;  Location: [facility];  Service: General;  Laterality: N/A;  PORT PLACEMENT MOVED NEW MESH INSERTED   . LAPAROSCOPIC GASTRIC BANDING  2005  . LAPAROSCOPIC VAGINAL HYSTERECTOMY WITH SALPINGECTOMY Bilateral 10/12/2015   Procedure: HYSTERECTOMY TOTAL LAPAROSCOPIC BILATERAL SALPINGECTOMY;  Surgeon: Sherlyn Hay, DO;  Location: [facility];  Service: Gynecology;  Laterality: Bilateral;  . reposition gastric  band  2006   x2  . TUBAL LIGATION  2014  . WISDOM TOOTH EXTRACTION  1999    Family History  Problem Relation Age of Onset  . Hypertension Mother   . Diabetes Unknown        father's family  . Thyroid cancer Unknown   . Breast cancer Maternal Grandmother     Social History   Socioeconomic History  . Marital status: Married    Spouse name: Not on file  . Number of children: 1  . Years of education: Not on file  . Highest education level: Not on file  Occupational History  . Occupation: Theatre manager: [employer]  . Financial resource strain: Not on file  . Food insecurity    Worry:  Not on file    Inability: Not on file  . Transportation needs    Medical: Not on file    Non-medical: Not on file  Tobacco Use  . Smoking status: Never Smoker  . Smokeless tobacco: Never Used  Substance and Sexual Activity  . Alcohol use: Yes    Comment: occ  . Drug use: No  . Sexual activity: Yes    Comment: separated, 2 caffeine drinks daily.   Lifestyle  . Physical activity    Days per week: Not on file    Minutes per session: Not on file  . Stress: Not on file  Relationships  . Social Herbalist on phone: Not on file    Gets together: Not on file    Attends religious service: Not on file    Active member of club or organization: Not on file    Attends meetings of clubs or organizations: Not on file    Relationship status: Not on file  . Intimate partner violence    Fear of current or ex partner: Not on file    Emotionally abused: Not on file    Physically abused: Not on file    Forced sexual activity: Not on file  Other Topics Concern  . Not on file  Social History Narrative   2 caffeine drinks per day    Outpatient Medications Prior to Visit  Medication Sig Dispense Refill  . buPROPion (WELLBUTRIN XL) 150 MG 24 hr tablet Take 1 tablet by mouth 2 (two) times daily.    . clotrimazole-betamethasone (LOTRISONE) cream Apply 1 application topically 2 (two) times daily. 45 g 0  . Insulin Pen Needle (BD PEN NEEDLE NANO U/F) 32G X 4 MM MISC USE TO INJECT INTO SKIN DAILY AS DIRECTED 100 each 2  . ketoconazole (NIZORAL) 2 % shampoo Apply to scalp twice a week for 8 weeks and then weekly thereafter 120 mL 11  . levocetirizine (XYZAL) 5 MG tablet TAKE 1 TABLET BY MOUTH EVERY DAY IN THE EVENING 90 tablet 1  . SUMAtriptan (IMITREX) 100 MG tablet TAKE 1 TABLET BY MOUTH EVERY 2HRS AS NEEDED FOR MIGRAINE. (MAY REPEAT IN 2HRS IF HEADACHE PERSIST) 10 tablet 3  . traZODone (DESYREL) 50 MG tablet TAKE 1/2 OR 2 TABLETS (25-100 MG) BY MOUTH AT BEDTIME AS NEEDED FOR SLEEP. 180  tablet 1  . tretinoin microspheres (RETIN-A MICRO) 0.04 % gel APPLY TO AFFECTED AREA EVERY DAY AT BEDTIME 45 g 0  . Liraglutide -Weight Management (SAXENDA) 18 MG/3ML SOPN Inject 3 mg into the skin daily. 15 mL 5  . phentermine 37.5 MG capsule Take 1 capsule (37.5 mg total) by mouth every  morning. 30 capsule 0   No facility-administered medications prior to visit.     Allergies  Allergen Reactions  . Latex Itching    Pt states she gets severely itchy with latex  . Naproxen Rash    ROS     Objective:    Physical Exam  Constitutional: She is oriented to person, place, and time. She appears well-developed and well-nourished.  HENT:  Head: Normocephalic and atraumatic.  Eyes: Conjunctivae and EOM are normal.  Cardiovascular: Normal rate.  Pulmonary/Chest: Effort normal.  Musculoskeletal:     Comments: Left knee with no erythema but she does have some swelling, generalized.  She has pain with flexion past 90 degrees.  Nontender over the medial lateral joint lines.  Nontender over the patellar tendon.  No increased laxity with varus or valgus stress.  Normal anterior drawer test.  She had pain with McMurray's but I did not feel a clicking or popping sensation.  Neurological: She is alert and oriented to person, place, and time.  Skin: Skin is dry. No pallor.  Psychiatric: She has a normal mood and affect. Her behavior is normal.  Vitals reviewed.   BP 130/88   Pulse 84   Ht 5\' 4"  (1.626 m)   LMP 08/29/2015 (Approximate)   BMI 42.40 kg/m  Wt Readings from Last 3 Encounters:  08/22/18 247 lb (112 kg)  08/07/18 247 lb (112 kg)  07/07/18 236 lb (107 kg)    There are no preventive care reminders to display for this patient.  There are no preventive care reminders to display for this patient.   Lab Results  Component Value Date   TSH 0.78 07/09/2018   Lab Results  Component Value Date   WBC 8.0 07/09/2018   HGB 12.7 07/09/2018   HCT 38.9 07/09/2018   MCV 81.9  07/09/2018   PLT 278 07/09/2018   Lab Results  Component Value Date   NA 139 07/09/2018   K 4.1 07/09/2018   CO2 27 07/09/2018   GLUCOSE 106 (H) 07/09/2018   BUN 11 07/09/2018   CREATININE 0.70 07/09/2018   BILITOT 0.4 07/09/2018   ALKPHOS 46 10/13/2015   AST 15 07/09/2018   ALT 10 07/09/2018   PROT 6.4 07/09/2018   ALBUMIN 3.1 (L) 10/13/2015   CALCIUM 9.2 07/09/2018   ANIONGAP 5 07/13/2017   Lab Results  Component Value Date   CHOL 171 07/09/2018   Lab Results  Component Value Date   HDL 57 07/09/2018   Lab Results  Component Value Date   LDLCALC 99 07/09/2018   Lab Results  Component Value Date   TRIG 60 07/09/2018   Lab Results  Component Value Date   CHOLHDL 3.0 07/09/2018   Lab Results  Component Value Date   HGBA1C 5.6 07/09/2018       Assessment & Plan:   Problem List Items Addressed This Visit      Other   Abnormal weight gain    Will refill Saxenda.  Recommend follow-up in 2 months.      Relevant Medications   Liraglutide -Weight Management (SAXENDA) 18 MG/3ML SOPN    Other Visit Diagnoses    Acute pain of left knee    -  Primary   Relevant Orders   DG Knee Complete 4 Views Left   Need for immunization against influenza       Relevant Orders   Flu Vaccine QUAD 36+ mos IM (Completed)     Left knee pain-we will start with  x-rays today since she is already had persistent pain that is been worsening over the last 6 months with conservative treatment.  Will call with results once available.  In the meantime recommend compression, icing and topical anti-inflammatory since she has had banding surgery.  Meds ordered this encounter  Medications  . Liraglutide -Weight Management (SAXENDA) 18 MG/3ML SOPN    Sig: Inject 3 mg into the skin daily.    Dispense:  15 mL    Refill:  5     Beatrice Lecher, MD

## 2018-11-14 NOTE — Patient Instructions (Signed)
Try to ice your knee when you get home from work and then again at bedtime just for 5 to 10 minutes at a time. Commend try Voltaren gel can be applied 3-4 times a day as needed. Once we get your x-ray results back we will give you call on Monday and let you know what to do.

## 2018-11-14 NOTE — Assessment & Plan Note (Signed)
Will refill Saxenda.  Recommend follow-up in 2 months.

## 2018-12-03 ENCOUNTER — Other Ambulatory Visit: Payer: Self-pay | Admitting: Family Medicine

## 2019-01-01 ENCOUNTER — Encounter: Payer: Self-pay | Admitting: Family Medicine

## 2019-01-01 ENCOUNTER — Ambulatory Visit (INDEPENDENT_AMBULATORY_CARE_PROVIDER_SITE_OTHER): Payer: 59 | Admitting: Family Medicine

## 2019-01-01 DIAGNOSIS — O24419 Gestational diabetes mellitus in pregnancy, unspecified control: Secondary | ICD-10-CM | POA: Insufficient documentation

## 2019-01-01 DIAGNOSIS — L232 Allergic contact dermatitis due to cosmetics: Secondary | ICD-10-CM | POA: Diagnosis not present

## 2019-01-01 DIAGNOSIS — B373 Candidiasis of vulva and vagina: Secondary | ICD-10-CM | POA: Insufficient documentation

## 2019-01-01 DIAGNOSIS — H1013 Acute atopic conjunctivitis, bilateral: Secondary | ICD-10-CM | POA: Diagnosis not present

## 2019-01-01 DIAGNOSIS — B3731 Acute candidiasis of vulva and vagina: Secondary | ICD-10-CM | POA: Insufficient documentation

## 2019-01-01 DIAGNOSIS — N939 Abnormal uterine and vaginal bleeding, unspecified: Secondary | ICD-10-CM | POA: Insufficient documentation

## 2019-01-01 DIAGNOSIS — N898 Other specified noninflammatory disorders of vagina: Secondary | ICD-10-CM | POA: Insufficient documentation

## 2019-01-01 DIAGNOSIS — Z8632 Personal history of gestational diabetes: Secondary | ICD-10-CM | POA: Insufficient documentation

## 2019-01-01 NOTE — Progress Notes (Signed)
Pt reports that her sxs began today. She stated that her R eye is red and irritated, no swelling. She hasn't used anything for this. She told me that anytime she wears Pink eye shadow this happens. She has removed any eye make up that may cause a problem for her.Maryruth Eve, [name], CMA

## 2019-01-01 NOTE — Progress Notes (Signed)
Virtual Visit via Video Note  I connected with [place] on 01/01/19 at  3:20 PM EST by a video enabled telemedicine application and verified that I am speaking with the correct person using two identifiers.   I discussed the limitations of evaluation and management by telemedicine and the availability of in person appointments. The patient expressed understanding and agreed to proceed.  Subjective:    CC: eye irritation.   HPI:  Pt reports that her sxs began today. She stated that her R eye is red and irritated, no swelling. She hasn't used anything for this. She told me that anytime she wears Pink eye shadow this happens. She has removed any eye make up that may cause a problem for her.  This has happened before but it usually does not actually cause the white part of her eye to turn pink but it did yesterday so she just wanted to make sure that everything was okay.  No other upper respiratory symptoms.    Past medical history, Surgical history, Family history not pertinant except as noted below, Social history, Allergies, and medications have been entered into the medical record, reviewed, and corrections made.   Review of Systems: No fevers, chills, night sweats, weight loss, chest pain, or shortness of breath.   Objective:    General: Speaking clearly in complete sentences without any shortness of breath.  Alert and oriented x3.  Normal judgment. No apparent acute distress.  She has a little bit of inflammation hyperpigmentation on the right upper eyelid and just below the lower eyelid.  The conjunctivolook a little inflamed particularly in the right eye and a little bit on the left as well.    Impression and Recommendations:    Allergic conjunctivitis-it sounds very much like a contact dermatitis.  We recommended a trial of Zaditor eyedrops to help with the irritation.  These are OTC.   Recommend cool compresses to help with any lid swelling.  She is also been getting a few  little itchy patches just below the eyebrow ridge on her left eye so okay to use a little over-the-counter cortisone as long as she does not get it directly on the lower half of the lid.  Call if not improving or not resolving or if any changes in vision etc.  Back that she probably has a red dye allergy so avoid make up with red dye.      I discussed the assessment and treatment plan with the patient. The patient was provided an opportunity to ask questions and all were answered. The patient agreed with the plan and demonstrated an understanding of the instructions.   The patient was advised to call back or seek an in-person evaluation if the symptoms worsen or if the condition fails to improve as anticipated.   Beatrice Lecher, MD

## 2019-01-19 ENCOUNTER — Ambulatory Visit (INDEPENDENT_AMBULATORY_CARE_PROVIDER_SITE_OTHER): Payer: 59 | Admitting: Sports Medicine

## 2019-01-19 ENCOUNTER — Other Ambulatory Visit: Payer: Self-pay

## 2019-01-19 DIAGNOSIS — S83207A Unspecified tear of unspecified meniscus, current injury, left knee, initial encounter: Secondary | ICD-10-CM | POA: Insufficient documentation

## 2019-01-19 DIAGNOSIS — M1712 Unilateral primary osteoarthritis, left knee: Secondary | ICD-10-CM

## 2019-01-19 MED ORDER — ACETAMINOPHEN ER 650 MG PO TBCR: 650.0000 mg | EXTENDED_RELEASE_TABLET | Freq: Three times a day (TID) | ORAL | 3 refills | Status: DC | PRN

## 2019-01-19 NOTE — Assessment & Plan Note (Signed)
Confirmed on x-ray, adding acetaminophen arthritis strength as she has had gastric band, home rehab exercises, return in a month, injection +/- custom orthotics with Dr. Raeford Razor if no better.

## 2019-01-19 NOTE — Progress Notes (Signed)
Subjective:    I'm seeing this patient as a consultation for: Dr. Beatrice Lecher  CC: Left knee pain  HPI: This is a pleasant 43 year old female, she is morbidly obese, for some time now she is had pain in her left knee, medial joint line, it was more swollen but it is come down significantly.  Moderate, persistent, localized without radiation, no trauma, no mechanical symptoms.  She has not yet tried any medications for this.  I reviewed the past medical history, family history, social history, surgical history, and allergies today and no changes were needed.  Please see the problem list section below in epic for further details.  Past Medical History: Past Medical History:  Diagnosis Date  . Anemia    Has sickle cell trait  . GERD (gastroesophageal reflux disease)    diet controlled  . Gestational diabetes   . H/O vaginal delivery 2010  . Headache(784.0)    Migraines  . Herpes 2007   hx of  . Hypertension    no current medications  . Iron deficiency anemia, unspecified 10/20/2012  . PONV (postoperative nausea and vomiting)    has had PONV with previous c/s and also lap band surgery 2004  . Seasonal allergies    Past Surgical History: Past Surgical History:  Procedure Laterality Date  . BILATERAL SALPINGECTOMY  07/16/2012   Procedure: BILATERAL DISTAL SALPINGECTOMY;  Surgeon: Logan Bores, MD;  Location: [facility];  Service: Obstetrics;;  . CESAREAN SECTION  2012  . CESAREAN SECTION N/A 07/16/2012   Procedure: REPEAT CESAREAN SECTION;  Surgeon: Logan Bores, MD;  Location: [facility];  Service: Obstetrics;  Laterality: N/A;  . CHOLECYSTECTOMY N/A 11/14/2012   Procedure: LAPAROSCOPIC CHOLECYSTECTOMY;  Surgeon: Gayland Curry, MD;  Location: [facility];  Service: General;  Laterality: N/A;  . GASTRIC BANDING PORT REVISION N/A 11/14/2012   Procedure: GASTRIC BANDING PORT REVISION;  Surgeon: Gayland Curry, MD;  Location: [facility];  Service: General;  Laterality: N/A;  PORT  PLACEMENT MOVED NEW MESH INSERTED   . LAPAROSCOPIC GASTRIC BANDING  2005  . LAPAROSCOPIC VAGINAL HYSTERECTOMY WITH SALPINGECTOMY Bilateral 10/12/2015   Procedure: HYSTERECTOMY TOTAL LAPAROSCOPIC BILATERAL SALPINGECTOMY;  Surgeon: Sherlyn Hay, DO;  Location: [facility];  Service: Gynecology;  Laterality: Bilateral;  . reposition gastric band  2006   x2  . TUBAL LIGATION  2014  . WISDOM TOOTH EXTRACTION  1999   Social History: Social History   Socioeconomic History  . Marital status: Married    Spouse name: Not on file  . Number of children: 1  . Years of education: Not on file  . Highest education level: Not on file  Occupational History  . Occupation: Theatre manager: [employer]  Tobacco Use  . Smoking status: Never Smoker  . Smokeless tobacco: Never Used  Substance and Sexual Activity  . Alcohol use: Yes    Comment: occ  . Drug use: No  . Sexual activity: Yes    Comment: separated, 2 caffeine drinks daily.   Other Topics Concern  . Not on file  Social History Narrative   2 caffeine drinks per day   Social Determinants of Health   Financial Resource Strain:   . Difficulty of Paying Living Expenses: Not on file  Food Insecurity:   . Worried About Charity fundraiser in the Last Year: Not on file  . Ran Out of Food in the Last Year: Not on file  Transportation Needs:   .  Lack of Transportation (Medical): Not on file  . Lack of Transportation (Non-Medical): Not on file  Physical Activity:   . Days of Exercise per Week: Not on file  . Minutes of Exercise per Session: Not on file  Stress:   . Feeling of Stress : Not on file  Social Connections:   . Frequency of Communication with Friends and Family: Not on file  . Frequency of Social Gatherings with Friends and Family: Not on file  . Attends Religious Services: Not on file  . Active Member of Clubs or Organizations: Not on file  . Attends Archivist Meetings: Not on file  . Marital Status:  Not on file   Family History: Family History  Problem Relation Age of Onset  . Hypertension Mother   . Diabetes Unknown        father's family  . Thyroid cancer Unknown   . Breast cancer Maternal Grandmother    Allergies: Allergies  Allergen Reactions  . Latex Itching    Pt states she gets severely itchy with latex  . Naproxen Rash   Medications: See med rec.  Review of Systems: No headache, visual changes, nausea, vomiting, diarrhea, constipation, dizziness, abdominal pain, skin rash, fevers, chills, night sweats, weight loss, swollen lymph nodes, body aches, joint swelling, muscle aches, chest pain, shortness of breath, mood changes, visual or auditory hallucinations.   Objective:   General: Well Developed, well nourished, and in no acute distress.  Neuro:  Extra-ocular muscles intact, able to move all 4 extremities, sensation grossly intact.  Deep tendon reflexes tested were normal. Psych: Alert and oriented, mood congruent with affect. ENT:  Ears and nose appear unremarkable.  Hearing grossly normal. Neck: Unremarkable overall appearance, trachea midline.  No visible thyroid enlargement. Eyes: Conjunctivae and lids appear unremarkable.  Pupils equal and round. Skin: Warm and dry, no rashes noted.  Cardiovascular: Pulses palpable, no extremity edema. Left knee: Normal to inspection with no erythema or effusion or obvious bony abnormalities. Severe tenderness at the medial joint line ROM normal in flexion and extension and lower leg rotation. Ligaments with solid consistent endpoints including ACL, PCL, LCL, MCL. Negative Mcmurray's and provocative meniscal tests. Non painful patellar compression. Patellar and quadriceps tendons unremarkable. Hamstring and quadriceps strength is normal.  Impression and Recommendations:   This case required medical decision making of moderate complexity.  Primary osteoarthritis of left knee Confirmed on x-ray, adding acetaminophen  arthritis strength as she has had gastric band, home rehab exercises, return in a month, injection +/- custom orthotics with Dr. Raeford Razor if no better.   ___________________________________________ Gwen Her. Dianah Field, M.D., ABFM., CAQSM. Primary Care and Sports Medicine [facility]  Adjunct Professor of [place] of [facility] [employer]

## 2019-02-17 ENCOUNTER — Ambulatory Visit: Payer: 59 | Admitting: Sports Medicine

## 2019-02-25 ENCOUNTER — Ambulatory Visit: Payer: 59 | Admitting: Sports Medicine

## 2019-03-06 ENCOUNTER — Other Ambulatory Visit: Payer: Self-pay | Admitting: General Surgery

## 2019-03-06 DIAGNOSIS — Z9884 Bariatric surgery status: Secondary | ICD-10-CM

## 2019-03-10 ENCOUNTER — Other Ambulatory Visit: Payer: Self-pay

## 2019-03-10 ENCOUNTER — Ambulatory Visit: Payer: 59 | Attending: Internal Medicine

## 2019-03-10 ENCOUNTER — Encounter: Payer: Self-pay | Admitting: Family Medicine

## 2019-03-10 ENCOUNTER — Ambulatory Visit (INDEPENDENT_AMBULATORY_CARE_PROVIDER_SITE_OTHER): Payer: 59 | Admitting: Family Medicine

## 2019-03-10 VITALS — Ht 64.0 in | Wt 258.0 lb

## 2019-03-10 DIAGNOSIS — J069 Acute upper respiratory infection, unspecified: Secondary | ICD-10-CM

## 2019-03-10 DIAGNOSIS — Z20822 Contact with and (suspected) exposure to covid-19: Secondary | ICD-10-CM | POA: Diagnosis not present

## 2019-03-10 NOTE — Progress Notes (Signed)
Pt reports that she has felt really tired and sluggish for 1 day, 3 days of diarrhea, some chills/sweats and sore throat for several days and a headache on/off x 1 wk. She said that the headache is not a migraine. The pain goes across her forehead over to temples. She has had some head congestion with pressure around her eyes. She also c/o around her shoulders feeling achy. She hasn't tried any OTC medications. She doesn't believe that she has come into contact with anyone who has been sick. She is still working and has been vigilant about hand washing and using her face mask.

## 2019-03-10 NOTE — Progress Notes (Signed)
Virtual Visit via Video Note  I connected with [place] on 03/10/19 at 11:30 AM EST by a video enabled telemedicine application and verified that I am speaking with the correct person using two identifiers.   I discussed the limitations of evaluation and management by telemedicine and the availability of in person appointments. The patient expressed understanding and agreed to proceed.  Subjective:    CC:   HPI: Pt reports that she has felt really tired and sluggish for 1 day, 3 days of diarrhea, some chills/sweats and sore throat for several days and a headache on/off x 1 wk. She said that the headache is not a migraine. The pain goes across her forehead over to temples. She has had some head congestion with pressure around her eyes. She also c/o around her shoulders feeling achy. She hasn't tried any OTC medications. She doesn't believe that she has come into contact with anyone who has been sick. She is still working and has been vigilant about hand washing and using her face mask.   Past medical history, Surgical history, Family history not pertinant except as noted below, Social history, Allergies, and medications have been entered into the medical record, reviewed, and corrections made.   Review of Systems: No fevers, chills, night sweats, weight loss, chest pain, or shortness of breath.   Objective:    General: Speaking clearly in complete sentences without any shortness of breath.  Alert and oriented x3.  Normal judgment. No apparent acute distress.    Impression and Recommendations:   URI vs COVID - Recommend she get tested for COVID.  Recommend symptomatic care.  Call back if any new symptoms develop or she worsens.  Since she lives in [place] she is going to go to call to get tested and explained how to go online to schedule appointment.  Work note provided.  If she ends up testing positive then we will go from there. She does work in [employer].     Time spent 18 min  in encounter.    I discussed the assessment and treatment plan with the patient. The patient was provided an opportunity to ask questions and all were answered. The patient agreed with the plan and demonstrated an understanding of the instructions.   The patient was advised to call back or seek an in-person evaluation if the symptoms worsen or if the condition fails to improve as anticipated.   Beatrice Lecher, MD

## 2019-03-12 LAB — NOVEL CORONAVIRUS, NAA: SARS-CoV-2, NAA: NOT DETECTED

## 2019-03-17 ENCOUNTER — Other Ambulatory Visit: Payer: Self-pay | Admitting: General Surgery

## 2019-03-17 ENCOUNTER — Ambulatory Visit
Admission: RE | Admit: 2019-03-17 | Discharge: 2019-03-17 | Disposition: A | Discharge status: Home / Self Care | Payer: 59 | Source: Ambulatory Visit | Attending: General Surgery | Admitting: General Surgery

## 2019-03-17 ENCOUNTER — Encounter: Payer: Self-pay | Admitting: Family Medicine

## 2019-03-17 DIAGNOSIS — Z9884 Bariatric surgery status: Secondary | ICD-10-CM

## 2019-03-17 DIAGNOSIS — K228 Other specified diseases of esophagus: Secondary | ICD-10-CM | POA: Diagnosis not present

## 2019-03-17 DIAGNOSIS — Z6831 Body mass index (BMI) 31.0-31.9, adult: Secondary | ICD-10-CM

## 2019-03-17 DIAGNOSIS — R635 Abnormal weight gain: Secondary | ICD-10-CM

## 2019-03-17 IMAGING — RF DG UGI W SINGLE CM
8 series · 13 of 24 positions shown · non-contrast
Comparison: [DATE].

CLINICAL DATA: Evaluate lap band.  Weight gain.

EXAM:
UPPER GI SERIES WITH KUB
TECHNIQUE: After obtaining a scout radiograph a routine upper GI series was
performed using barium
FLUOROSCOPY TIME:  Fluoroscopy Time:  1 minutes and 42 seconds.
Radiation Exposure Index (if provided by the fluoroscopic device):
593 mGy
Number of Acquired Spot Images:

[Series 1: one shot · 0.14mm/px · 1 of 1 slices shown (1 of 3)]
[im 1/1]
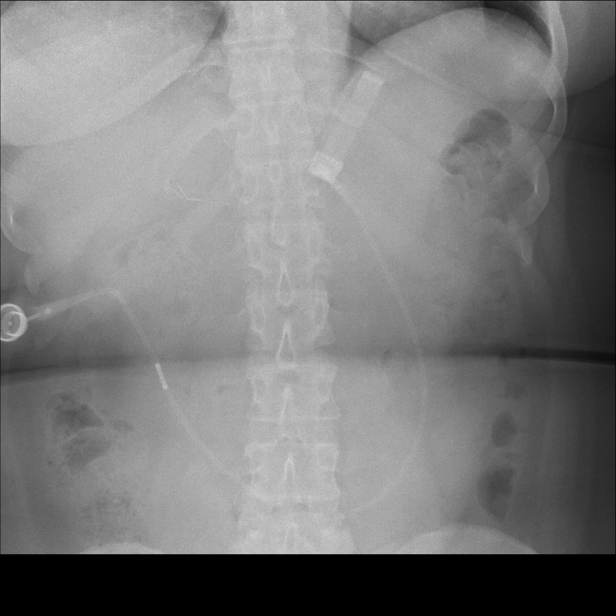

[Series 2: sequence · 0.29mm/px · 2 of 12 frames shown (1 of 5)]
[frame 7/12]
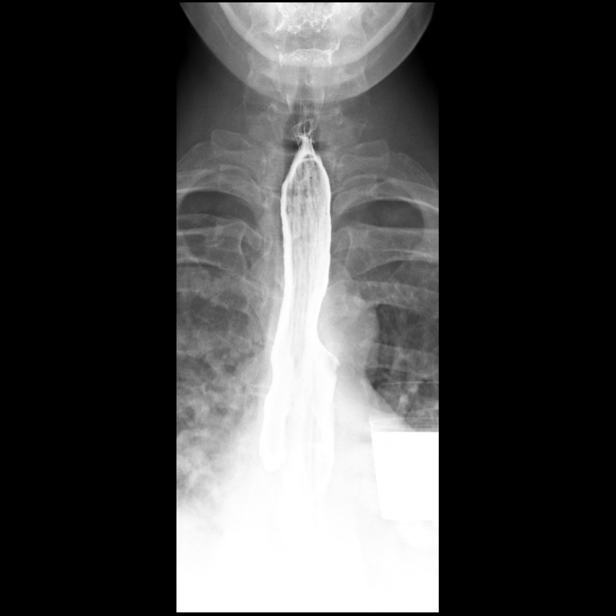
[frame 11/12]
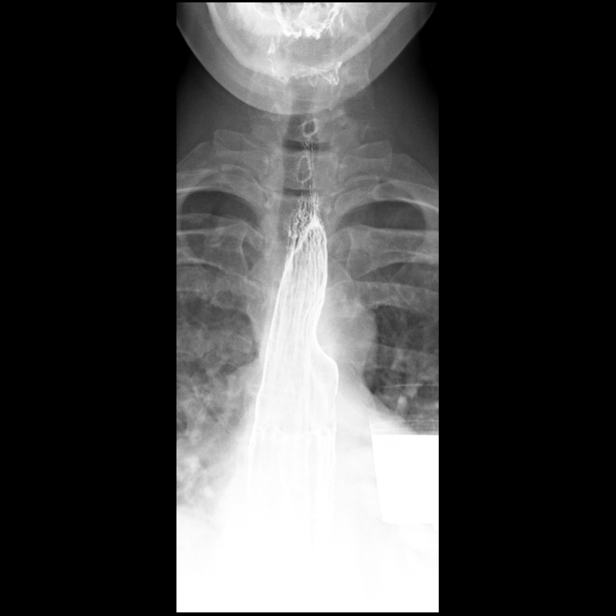

[Series 3: sequence · 0.29mm/px · 2 of 25 frames shown (2 of 5)]
[frame 7/25]
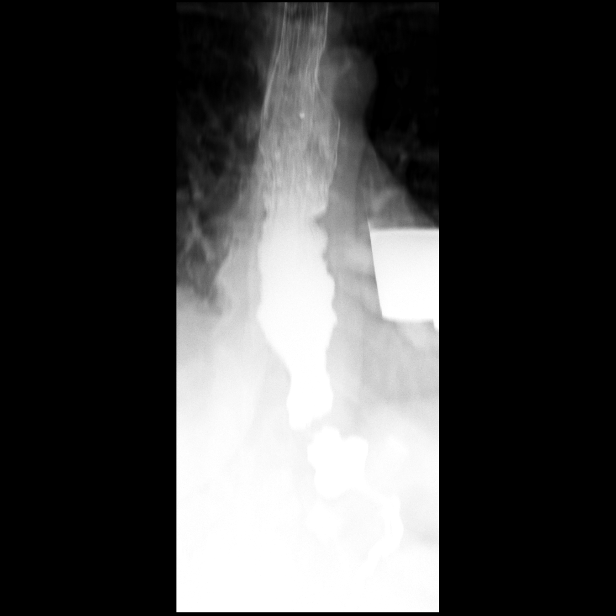
[frame 22/25]
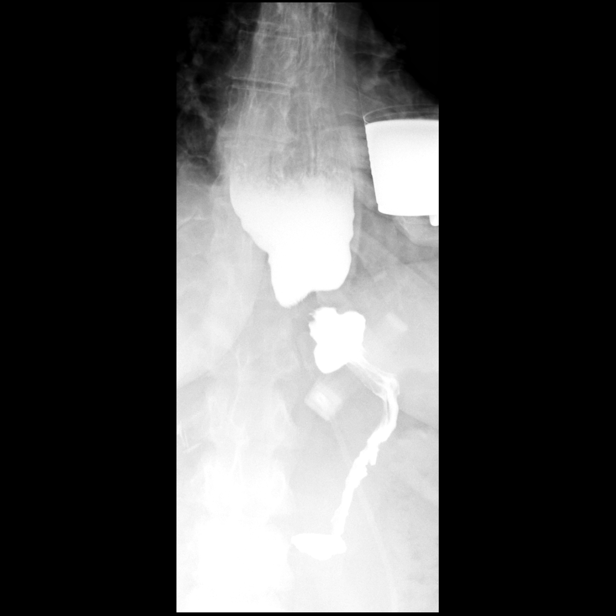

[Series 4: one shot · 1 of 2 slices shown (2 of 3)]
[im 2/2]
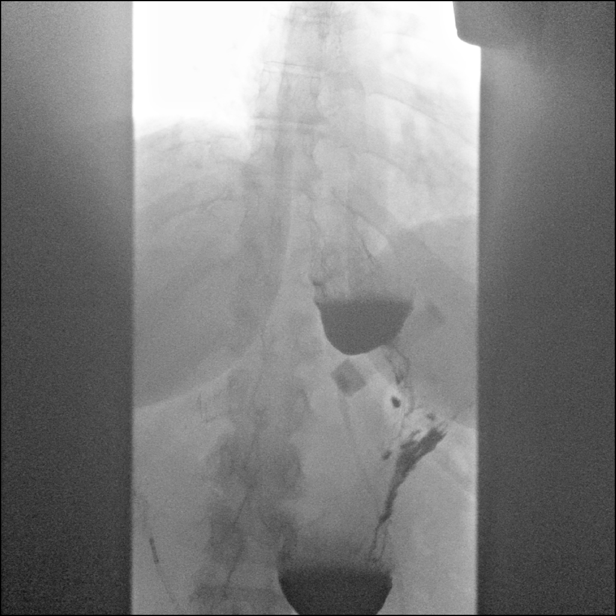

[Series 5: sequence · 2 of 156 frames shown (3 of 5)]
[frame 35/156]
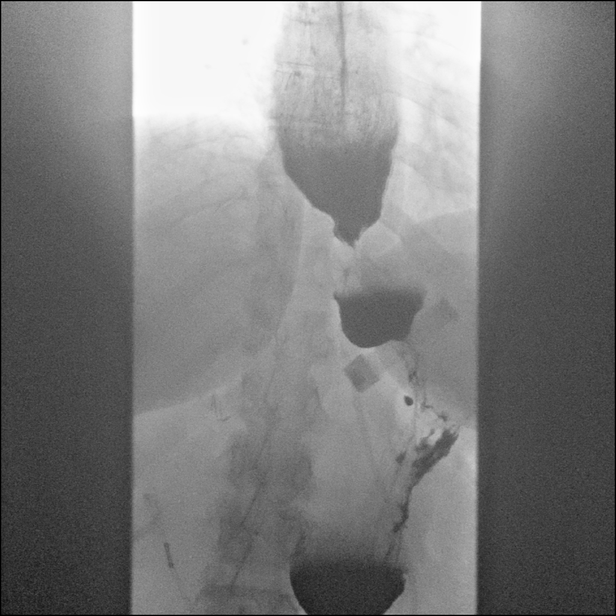
[frame 79/156]
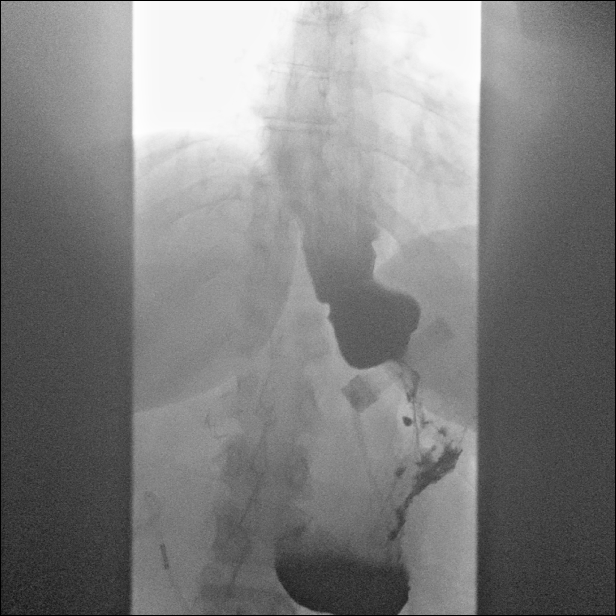

[Series 6: sequence · 2 of 99 frames shown (4 of 5)]
[frame 3/99]
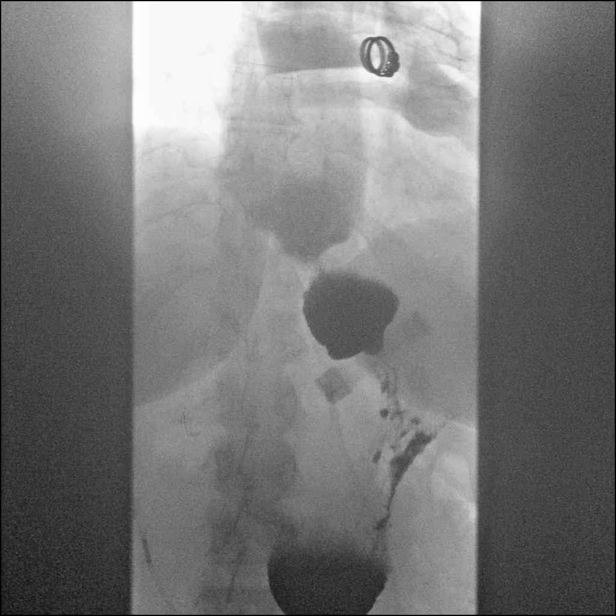
[frame 50/99]
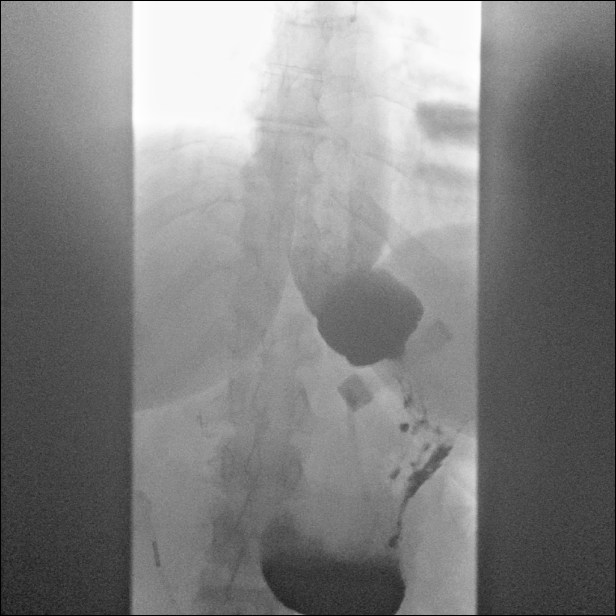

[Series 7: one shot · 1 of 1 slices shown (3 of 3)]
[im 1/1]
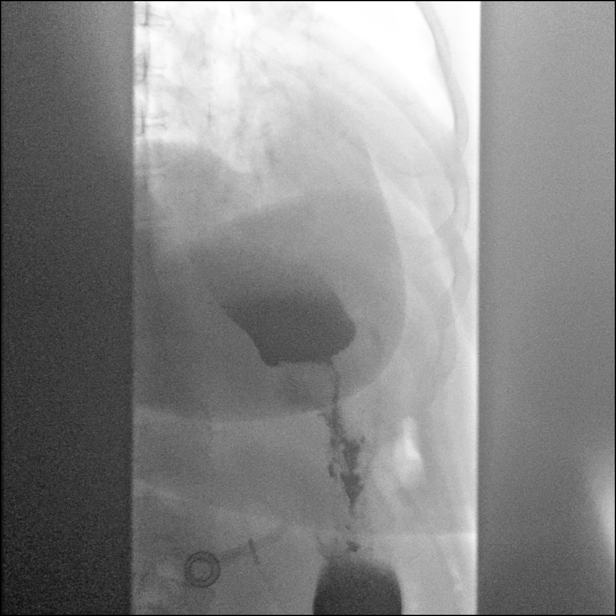

[Series 8: sequence · 2 of 19 frames shown (5 of 5)]
[frame 10/19]
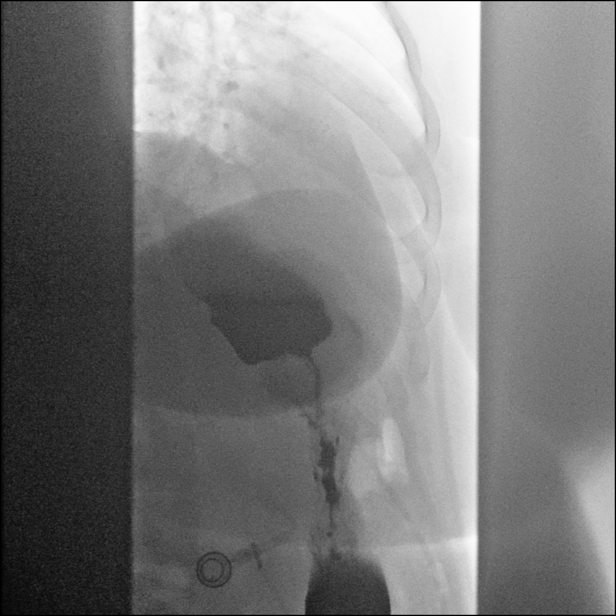
[frame 17/19]
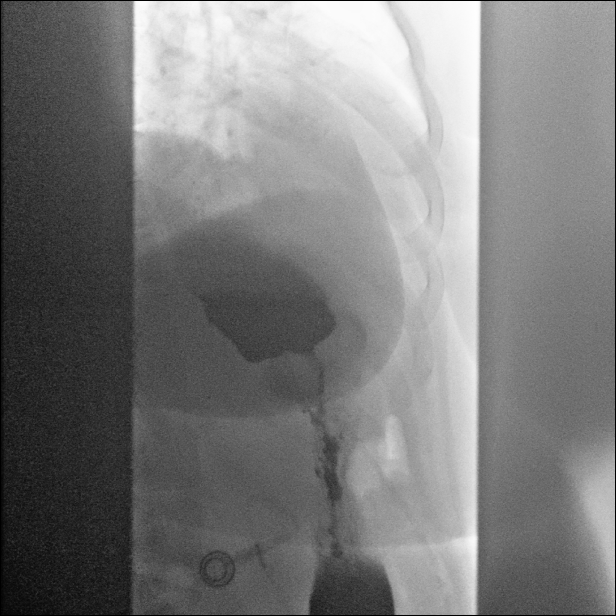

[13 of 24 positions shown; findings below may reference images not displayed]

FINDINGS: Initial scout KUB shows slight interval change in position of the
lap band, now oriented from 1 o'clock to 7 o'clock, altered from
[DATE] to [DATE] on the prior study. Bowel gas pattern is normal.
Surgical clips in the right upper quadrant are compatible with prior
cholecystectomy.

Frontal view of the hypopharynx obtained with the patient drinking
thick barium is unremarkable. This initial swallow of barium was
traced into the distal esophagus, demonstrating a diffusely patulous
esophagus. Similar to the prior study, there is an abrupt transition
point in the distal esophagus, at the esophagogastric junction that
persisted during observation. At this point the exam, there is a
technical malfunction with the fluoro machine, interrupting the
exam.

After function was restored to the floor machine, the patient was
given additional swallows of thin barium. These again show a
patulous esophagus with stasis of contrast in the patulous esophagus
proximal to the esophagogastric junction. With continued
observation, the esophagogastric junction did open allowing passage
of contrast material into the stomach proximal to the lap band. With
the opening of the lower esophageal sphincter, the patient stated,
"I felt that". On the subsequent images, the lap band is oriented
at a more typical 2 o'clock to 8 o'clock position and passage of
contrast material through the lap band has expected appearance.

The patient was given a 13 mm barium tablet to ensure that it
transition through the lower esophageal sphincter, which it did.
IMPRESSION: 1. On initial scout KUB, the lap band appears differentially
oriented compared to the prior study, at a 1 o'clock to 7 o'clock
position today as opposed to [DATE] to [DATE] previously. On more
delayed imaging after drinking barium on today's study, the lap band
does have a more typical 2 o'clock to 8 o'clock orientation.
2. Diffusely patulous esophagus, similar to prior with delayed
passage of contrast through the lower esophageal sphincter into the
gastric cardia proximal to the lap band. However, contrast material
does ultimately flow through the lower esophageal sphincter.
3. Expected appearance of contrast migration through the lap band
into the distal stomach.

## 2019-03-20 NOTE — Telephone Encounter (Signed)
Order signed.

## 2019-03-20 NOTE — Addendum Note (Signed)
Addended by: Beatrice Lecher D on: 03/20/2019 03:07 PM   Modules accepted: Orders

## 2019-03-20 NOTE — Addendum Note (Signed)
Addended by: Towana Badger on: 03/20/2019 02:30 PM   Modules accepted: Orders

## 2019-03-20 NOTE — Telephone Encounter (Signed)
Wasn't sure which referral you were taking about, so I entered one for wight management and one for surgery. Not sent yet because I wanted you to just clarify

## 2019-03-27 ENCOUNTER — Encounter: Payer: Self-pay | Admitting: Family Medicine

## 2019-03-31 NOTE — Telephone Encounter (Signed)
Dr Madilyn Fireman, please see patient attachments. I have pended a letter, please review.

## 2019-04-03 NOTE — Telephone Encounter (Signed)
Letter printed and placed on your desk.  Thank you so much with for your help on this.

## 2019-05-06 ENCOUNTER — Telehealth: Payer: Self-pay

## 2019-05-06 DIAGNOSIS — M1712 Unilateral primary osteoarthritis, left knee: Secondary | ICD-10-CM

## 2019-05-06 NOTE — Telephone Encounter (Signed)
Jamie Mccarthy states she is having swelling of her left knee with pain. She is ready for next steps. She agreed for a referral for custom orthotics.   Primary osteoarthritis of left knee Confirmed on x-ray, adding acetaminophen arthritis strength as she has had gastric band, home rehab exercises, return in a month, injection +/- custom orthotics with Dr. Raeford Razor if no better.   ___________________________________________ Gwen Her. Dianah Field, M.D., ABFM., CAQSM. [facility]

## 2019-05-15 ENCOUNTER — Telehealth (INDEPENDENT_AMBULATORY_CARE_PROVIDER_SITE_OTHER): Payer: 59 | Admitting: Medical-Surgical

## 2019-05-15 ENCOUNTER — Encounter: Payer: Self-pay | Admitting: Medical-Surgical

## 2019-05-15 DIAGNOSIS — L509 Urticaria, unspecified: Secondary | ICD-10-CM

## 2019-05-15 MED ORDER — CETIRIZINE HCL 10 MG PO TABS: ORAL_TABLET | ORAL | 0 refills | Status: DC

## 2019-05-15 MED ORDER — PREDNISONE 50 MG PO TABS: 50.0000 mg | ORAL_TABLET | Freq: Every day | ORAL | 0 refills | Status: DC

## 2019-05-15 MED ORDER — FAMOTIDINE 20 MG PO TABS: 20.0000 mg | ORAL_TABLET | Freq: Two times a day (BID) | ORAL | 1 refills | Status: DC

## 2019-05-15 NOTE — Progress Notes (Signed)
Virtual Visit via Video Note  I connected with Jamie Mccarthy on 05/15/19 at 11:10 AM EDT by a video enabled telemedicine application and verified that I am speaking with the correct person using two identifiers.   I discussed the limitations of evaluation and management by telemedicine and the availability of in person appointments. The patient expressed understanding and agreed to proceed.  Subjective:    CC: itching and hives  HPI: Pleasant 44 year old female presenting via [place] video visit for evaluation of itching and hives.  Reports diffuse itching with welting present on the abdomen, back, and breasts.  Denies shortness of breath, wheezing, chest pain, palpitations, fever, chills.  No new foods, medications, materials, detergents, or cosmetics.  Did have an episode that happened last year similar to this with unknown etiology.  Taking Xyzal daily and has increased to twice daily for the last couple of days, no relief.  Has not tried any other interventions.  Past medical history, Surgical history, Family history not pertinant except as noted below, Social history, Allergies, and medications have been entered into the medical record, reviewed, and corrections made.   Review of Systems: No fevers, chills, night sweats, weight loss, chest pain, or shortness of breath.   Objective:    General: Speaking clearly in complete sentences without any shortness of breath.  Alert and oriented x3.  Normal judgment. No apparent acute distress.  Impression and Recommendations:    1. Urticaria Unknown etiology. Prednisone 50 mg daily x5 days.  Changing from Xyzal to cetirizine, take 1 tablet twice daily for 14 days then reduce to once daily.  Also starting famotidine 20 mg twice daily.  May benefit from allergist referral if this occurs again. - famotidine (PEPCID) 20 MG tablet; Take 1 tablet (20 mg total) by mouth 2 (two) times daily.  Dispense: 60 tablet; Refill: 1 - predniSONE (DELTASONE) 50  MG tablet; Take 1 tablet (50 mg total) by mouth daily.  Dispense: 5 tablet; Refill: 0 - cetirizine (ZYRTEC) 10 MG tablet; Take 1 tablet twice daily for 14 days then reduce to 1 tablet once daily.  Dispense: 42 tablet; Refill: 0  I discussed the assessment and treatment plan with the patient. The patient was provided an opportunity to ask questions and all were answered. The patient agreed with the plan and demonstrated an understanding of the instructions.   The patient was advised to call back or seek an in-person evaluation if the symptoms worsen or if the condition fails to improve as anticipated.  Follow-up: If no improvement by Monday, contact the office for reevaluation.  If shortness of breath, wheezing, or chest pain develops, seek emergency care.  20 minutes of non-face-to-face time was provided during this encounter.  Clearnce Sorrel, DNP, APRN, FNP-BC [facility]

## 2019-05-20 ENCOUNTER — Encounter: Payer: Self-pay | Admitting: Dietician

## 2019-05-20 ENCOUNTER — Other Ambulatory Visit: Payer: Self-pay

## 2019-05-20 ENCOUNTER — Encounter: Payer: 59 | Attending: General Surgery | Admitting: Dietician

## 2019-05-20 DIAGNOSIS — E669 Obesity, unspecified: Secondary | ICD-10-CM | POA: Diagnosis present

## 2019-05-20 NOTE — Progress Notes (Signed)
Nutrition Assessment for Bariatric Surgery Medical Nutrition Therapy   Patient was seen on 05/20/2019 for Pre-Operative Nutrition Assessment. Letter of approval faxed to [facility] bariatric surgery program coordinator on 05/20/2019.   Referral stated Supervised Weight Loss (SWL) visits needed: 0  Planned surgery: VG Band to RYGB Pt expectation of surgery: would like to weigh ~145 lbs; to be around longer for kids, to feel better, to have better relationship with husband, to feel more confident, to be healthier  Pt expectation of dietitian: provide healthful nutrition education pre-operatively and provide guidance on what to eat after surgery (ex: meal ideas)    NUTRITION ASSESSMENT   Anthropometrics  Start weight at [facility]: 266 lbs (date: 05/20/2019) Height: 64 in BMI: 45.7 kg/m2     Lifestyle & Dietary Hx Patient lives with her husband and kids. She and her husband share food shopping/cooking responsibilities. States she mostly avoids chicken due to not tolerating very well, but eats beef, fish, and seafood. Now can tolerate most foods fine. Successful with LapBand in 2004. States she has sweets cravings. Dislikes breakfast foods mostly, but will do hardboiled eggs. Likes salads and will often do these for lunch, either a homemade salad or from fast food restaurant such as [employer] grilled chicken salad. Does not care for sandwiches. Sticks with smaller portions. May have Oodles of Noodles, deli meat and cheese from a sub sandwich, or tacos. May snack on potato chips, tortilla chips and salsa, candy bars.   24-Hr Dietary Recall First Meal: - Snack: 3 nab crackers Second Meal: salad w/ toppings & salad dressing Snack: Butterfinger candy bar Third Meal: rigatoni  Snack: - Beverages: water, alcohol   NUTRITION DIAGNOSIS  Overweight/obesity ([place]-3.3) related to past poor dietary habits and physical inactivity as evidenced by patient w/ planned VG Band to RYGB conversion  surgery following dietary guidelines for continued weight loss.    NUTRITION INTERVENTION  Nutrition counseling (C-1) and education (E-2) to facilitate bariatric surgery goals.  Pre-Op Goals Reviewed with the Patient . Track food and beverage intake (pen and paper, MyFitness Pal, Baritastic app, etc.) . Make healthy food choices while monitoring portion sizes . Consume 3 meals per day or try to eat every 3-5 hours . Avoid concentrated sugars and fried foods . Keep sugar & fat in the single digits per serving on food labels . Practice CHEWING your food (aim for applesauce consistency) . Practice not drinking 15 minutes before, during, and 30 minutes after each meal and snack . Avoid all carbonated beverages (ex: soda, sparkling beverages)  . Limit caffeinated beverages (ex: coffee, tea, energy drinks) . Avoid all sugar-sweetened beverages (ex: regular soda, sports drinks)  . Avoid alcohol  . Aim for 64-100 ounces of FLUID daily (with at least half of fluid intake being plain water)  . Aim for at least 60-80 grams of PROTEIN daily . Look for a liquid protein source that contains ?15 g protein and ?5 g carbohydrate (ex: shakes, drinks, shots) . Make a list of non-food related activities . Physical activity is an important part of a healthy lifestyle so keep it moving! The goal is to reach 150 minutes of exercise per week, including cardiovascular and weight baring activity.  Handouts Provided Include  . Bariatric Surgery handouts (Nutrition Visits, Pre-Op Goals, Protein Shakes, Vitamins & Minerals)  Learning Style & Readiness for Change Teaching method utilized: Visual & Auditory  Demonstrated degree of understanding via: Teach Back  Barriers to learning/adherence to lifestyle change: None Identified  MONITORING & EVALUATION Dietary intake, weekly physical activity, body weight, and pre-op goals reached at next nutrition visit.   Next Steps Patient is to follow up at [facility] for  Pre-Op Class (>2 weeks before surgery) for further nutrition education.

## 2019-05-25 NOTE — Telephone Encounter (Signed)
Jamie Mccarthy is wanting to go ahead and proceed with the referral for custom orthotics with Dr Raeford Razor.

## 2019-05-25 NOTE — Telephone Encounter (Signed)
No problem, placing the referral to Dr. Raeford Razor.  We had also discussed an injection at that time.

## 2019-05-26 NOTE — Telephone Encounter (Signed)
Patient advised of the referral. I, again, mentioned the injection. She declined the injection at this time.

## 2019-06-05 ENCOUNTER — Telehealth: Payer: Self-pay | Admitting: Family Medicine

## 2019-06-05 NOTE — Telephone Encounter (Signed)
Ms. Toby called. She was sent home from work  due to tightness in her chest and numbness in both hands and left eye blurry. I spoke to Apolonio Schneiders in Triage and she adivised that I tell the  patient to either go to Urgent Care or ER. Patient states she is on her way.

## 2019-06-08 ENCOUNTER — Other Ambulatory Visit: Payer: Self-pay

## 2019-06-08 ENCOUNTER — Encounter: Payer: 59 | Attending: General Surgery | Admitting: Skilled Nursing Facility1

## 2019-06-08 DIAGNOSIS — E669 Obesity, unspecified: Secondary | ICD-10-CM | POA: Insufficient documentation

## 2019-06-08 NOTE — Telephone Encounter (Signed)
I discussed this with Mardene Celeste, agree with documentation.

## 2019-06-08 NOTE — Progress Notes (Signed)
Pre-Operative Nutrition Class:  Appt start time: 0048   End time:  1830.  Patient was seen on 06/08/2019 for Pre-Operative Bariatric Surgery Education at [facility].    Surgery date:  Surgery type: Conversion to RYGB Start weight at [facility]: 266 Weight today: 269.1  Samples given per MNT protocol. Patient educated on appropriate usage: Celebrate Multivitamin Lot # [id number] Exp: 04/2020  celebrate Calcium  Lot # [id number] Exp: 08/2020  premier Protein   Shake Lot # [id number] Exp: 09/20/19  The following the learning objectives were met by the patient during this course:  Identify Pre-Op Dietary Goals and will begin 2 weeks pre-operatively  Identify appropriate sources of fluids and proteins   State protein recommendations and appropriate sources pre and post-operatively  Identify Post-Operative Dietary Goals and will follow for 2 weeks post-operatively  Identify appropriate multivitamin and calcium sources  Describe the need for physical activity post-operatively and will follow MD recommendations  State when to call healthcare provider regarding medication questions or post-operative complications  Handouts given during class include:  Pre-Op Bariatric Surgery Diet Handout  Protein Shake Handout  Post-Op Bariatric Surgery Nutrition Handout  BELT Program Information Flyer  Support Group Information Flyer  [facility] Bariatric Supplements Price List  Follow-Up Plan: Patient will follow-up at [facility] 2 weeks post operatively for diet advancement per MD.

## 2019-06-17 ENCOUNTER — Other Ambulatory Visit: Payer: Self-pay

## 2019-06-17 ENCOUNTER — Ambulatory Visit (INDEPENDENT_AMBULATORY_CARE_PROVIDER_SITE_OTHER): Payer: 59 | Admitting: Family Medicine

## 2019-06-17 ENCOUNTER — Encounter: Payer: Self-pay | Admitting: Family Medicine

## 2019-06-17 DIAGNOSIS — M1712 Unilateral primary osteoarthritis, left knee: Secondary | ICD-10-CM

## 2019-06-17 NOTE — Assessment & Plan Note (Signed)
Prolonged standing may be related to her worsening of her knee pain  -Counseled on home exercise therapy and supportive care. -Orthotics. -Could consider scaphoid pads.

## 2019-06-17 NOTE — Progress Notes (Signed)
Jamie Mccarthy - 44 y.o. female MRN [id number]  Date of birth: Jul 22, 1975  SUBJECTIVE:  Including CC & ROS.  Chief Complaint  Patient presents with  . Foot Orthotics    Jamie Mccarthy is a 44 y.o. female that is presenting with left knee pain.  She has to stand all day at work and that seems to exacerbate her symptoms.     Review of Systems See HPI   HISTORY: Past Medical, Surgical, Social, and Family History Reviewed & Updated per EMR.   Pertinent Historical Findings include:  Past Medical History:  Diagnosis Date  . Anemia    Has sickle cell trait  . GERD (gastroesophageal reflux disease)    diet controlled  . Gestational diabetes   . H/O vaginal delivery 2010  . Headache(784.0)    Migraines  . Herpes 2007   hx of  . Hypertension    no current medications  . Iron deficiency anemia, unspecified 10/20/2012  . Obese   . PONV (postoperative nausea and vomiting)    has had PONV with previous c/s and also lap band surgery 2004  . Seasonal allergies     Past Surgical History:  Procedure Laterality Date  . BILATERAL SALPINGECTOMY  07/16/2012   Procedure: BILATERAL DISTAL SALPINGECTOMY;  Surgeon: Logan Bores, MD;  Location: [facility];  Service: Obstetrics;;  . CESAREAN SECTION  2012  . CESAREAN SECTION N/A 07/16/2012   Procedure: REPEAT CESAREAN SECTION;  Surgeon: Logan Bores, MD;  Location: [facility];  Service: Obstetrics;  Laterality: N/A;  . CHOLECYSTECTOMY N/A 11/14/2012   Procedure: LAPAROSCOPIC CHOLECYSTECTOMY;  Surgeon: Gayland Curry, MD;  Location: [facility];  Service: General;  Laterality: N/A;  . GASTRIC BANDING PORT REVISION N/A 11/14/2012   Procedure: GASTRIC BANDING PORT REVISION;  Surgeon: Gayland Curry, MD;  Location: [facility];  Service: General;  Laterality: N/A;  PORT PLACEMENT MOVED NEW MESH INSERTED   . LAPAROSCOPIC GASTRIC BANDING  2005  . LAPAROSCOPIC VAGINAL HYSTERECTOMY WITH SALPINGECTOMY Bilateral 10/12/2015   Procedure: HYSTERECTOMY TOTAL  LAPAROSCOPIC BILATERAL SALPINGECTOMY;  Surgeon: Sherlyn Hay, DO;  Location: [facility];  Service: Gynecology;  Laterality: Bilateral;  . reposition gastric band  2006   x2  . TUBAL LIGATION  2014  . WISDOM TOOTH EXTRACTION  1999    Family History  Problem Relation Age of Onset  . Hypertension Mother   . Diabetes Other        father's family  . Thyroid cancer Other   . Breast cancer Maternal Grandmother     Social History   Socioeconomic History  . Marital status: Married    Spouse name: Not on file  . Number of children: Not on file  . Years of education: Not on file  . Highest education level: Not on file  Occupational History  . Occupation: Theatre manager: [employer]  Tobacco Use  . Smoking status: Never Smoker  . Smokeless tobacco: Never Used  Substance and Sexual Activity  . Alcohol use: Yes    Comment: occ  . Drug use: No  . Sexual activity: Yes    Comment: separated, 2 caffeine drinks daily.   Other Topics Concern  . Not on file  Social History Narrative   2 caffeine drinks per day   Social Determinants of Health   Financial Resource Strain:   . Difficulty of Paying Living Expenses:   Food Insecurity:   . Worried About Charity fundraiser in  the Last Year:   . [place] in the Last Year:   Transportation Needs:   . Film/video editor (Medical):   Marland Kitchen Lack of Transportation (Non-Medical):   Physical Activity:   . Days of Exercise per Week:   . Minutes of Exercise per Session:   Stress:   . Feeling of Stress :   Social Connections:   . Frequency of Communication with Friends and Family:   . Frequency of Social Gatherings with Friends and Family:   . Attends Religious Services:   . Active Member of Clubs or Organizations:   . Attends Archivist Meetings:   Marland Kitchen Marital Status:   Intimate Partner Violence:   . Fear of Current or Ex-Partner:   . Emotionally Abused:   Marland Kitchen Physically Abused:   . Sexually Abused:       PHYSICAL EXAM:  VS: BP 138/90   Pulse 80   Ht 5\' 4"  (1.626 m)   Wt 269 lb (122 kg)   LMP 08/29/2015 (Approximate)   BMI 46.17 kg/m  Physical Exam Gen: NAD, alert, cooperative with exam, well-appearing MSK:  Right and left foot: Fairly flat longitudinal arch. Some pronation of left worse than right. Neurovascular intact  Patient was fitted for a standard, cushioned, semi-rigid orthotic. The orthotic was heated and afterward the patient stood on the orthotic blank positioned on the orthotic stand. The patient was positioned in subtalar neutral position and 10 degrees of ankle dorsiflexion in a weight bearing stance. After completion of molding, a stable base was applied to the orthotic blank. The blank was ground to a stable position for weight bearing. Size: 39M Pairs: 2 Base: Blue EVA Additional Posting and Padding: None The patient ambulated these, and they were very comfortable.    ASSESSMENT & PLAN:   Primary osteoarthritis of left knee Prolonged standing may be related to her worsening of her knee pain  -Counseled on home exercise therapy and supportive care. -Orthotics. -Could consider scaphoid pads.

## 2019-06-27 ENCOUNTER — Ambulatory Visit: Payer: Self-pay | Admitting: General Surgery

## 2019-06-27 NOTE — H&P (Signed)
Areal A Spells Appointment: 06/25/2019 3:30 PM Location: [facility] Patient #: [id number] DOB: 02/17/1975 Married / Language: English / Race: Black or African American Female  History of Present Illness Jamie Mccarthy Patient MD; 06/27/2019 2:28 PM) The patient is a 44 year old female is here for LapBand followup.Jamie Mccarthy is a 44 year old patient who received a VG band at [facility] in 2004 (there are apparently no records of her procedure). She transferred her care to [place] in 2014. She underwent laparoscopic cholecystectomy and port revision by me that year. She comes in for follow-up regarding her severe obesity. I last saw her in late April where we removed all the fluid from her band given her abnormal upper GI. She states that since removing the fluid she is no longer coughing at night and has had no heartburn or reflux. No regurgitation. She has completed the pathway for revisional surgery. She has received psychological clearance. Otherwise she denies any changes since I last saw her in the end of April. She denies any chest pain, chest pressure, shortness of breath, orthopnea, dyspnea exertion, paroxysmal nocturnal dyspnea. No tobacco.  bariatric evaluation labs in march 2021 showed a1c 6.9, bld glucose 147 (nonfasting), LDL 108, o/w normal cbc, cmet, tsh, lipid panel.  UGI winter 2021 1. On initial scout KUB, the lap band appears differentially oriented compared to the prior study, at a 1 o'clock to 7 o'clock position today as opposed to 2:00 to 8:00 previously. On more delayed imaging after drinking barium on today's study, the lap band does have a more typical 2 o'clock to 8 o'clock orientation. 2. Diffusely patulous esophagus, similar to prior with delayed passage of contrast through the lower esophageal sphincter into the gastric cardia proximal to the lap band. However, contrast material does ultimately flow through the lower esophageal sphincter. 3. Expected appearance of  contrast migration through the lap band into the distal stomach.  04/23/19 Jamie Mccarthy is a 44 year old patient who received a VG band at [facility] in 2004 (there are apparently no records of her procedure). She transferred her care to [place] in 2014. She underwent laparoscopic cholecystectomy and port revision by me that year. She was last in the office in Feb 2021. Her weight at that time was 258 pounds. Prior visit weight in 10/2017 was 201 lbs. Her reported preoperative weight in 2004 was 299 pounds. She came in februrary 2021 with complaints of able to eat large portions of food. She stated that she has no restriction. She had a maximal amount of fluid that her band would accommodate. Based on her sensation of no restriction I ordered an upper GI to evaluate the position of her band. Unfortunately it demonstrated significant dilated patulous esophagus with delayed passage of contrast through the lower esophageal sphincter into the gastric cardia proximal to the lap band. Initially there looked like there may be a slip but on subsequent imaging the band appeared to be in she comes in today for fluid removal as well as discuss revision surgery. She denies any changes since she was last seen.   She completed our Neurosurgeon. She is been working on Administrator, sports and being consistent with her food choices. Despite these efforts she has not been able to lose significant weight.  She denies any chest pain, chest pressure, source of breath, orthopnea, paroxysmal nocturnal dyspnea, dyspnea on exertion, TIAs or amaurosis fugax. She denies any personal or family history of blood clots. She does have reflux. Her sleep  apnea questionnaire was low risk. She has had a cholecystectomy and laparoscopic adjustable gastric band procedure. She has a bowel movement every several days. She denies any melena or hematochezia. She is currently not regurgitating. She denies any dysuria or hematuria. She had a  partial hysterectomy in 2017. She does have left knee pain and has been diagnosed with HER arthritis. She denies a diagnosis of diabetes mellitus. She denies any severe headaches or migraines. She denies any blurry vision.  She denies any tobacco or drug use. She drinks alcohol infrequently.   Problem List/Past Medical Jamie Hiss M. Redmond Pulling, MD; 06/27/2019 2:32 PM) OSTEOARTHRITIS OF KNEE, UNILATERAL (M17.10) FITTING AND ADJUSTMENT OF GASTRIC LAP BAND (Z46.51) OVEREATING (R63.2) DIABETES MELLITUS TYPE 2 IN OBESE (E11.69) GASTRIC BANDING STATUS (Z98.84) [facility]BAND [facility] at [facility] in [facility]; port revision; It is facing the RIGHT side of her body. It is slightly lateral to her incision and I came straight down almost at a 45 angle. SEVERE OBESITY (E66.01)  Past Surgical History Jamie Hiss M. Redmond Pulling, MD; 06/27/2019 2:32 PM) Cesarean Section - Multiple Gallbladder Surgery - Laparoscopic Hysterectomy (not due to cancer) - Partial Lap Band Oral Surgery Cesarean Section - 1  Diagnostic Studies History Jamie Hiss M. Redmond Pulling, MD; 06/27/2019 2:32 PM) Colonoscopy never Mammogram within last year never Pap Smear 1-5 years ago  Allergies (Tanisha A. Owens Shark, RMA; 06/25/2019 3:24 PM) Latex Exam Gloves *MEDICAL DEVICES AND SUPPLIES* Naproxen *ANALGESICS - ANTI-INFLAMMATORY* Allergies Reconciled  Medication History (Tanisha A. Owens Shark, RMA; 06/25/2019 3:25 PM) Lidocaine (5% Ointment, 1 (one) External before procedure, Taken starting 05/12/2019) Active. (Apply over port 15-30 minutes before procedure; cover with Saran wrap) Saxenda (18MG /3ML Soln Pen-inj, Subcutaneous) Active. Xyzal Allergy 24HR (5MG  Tablet, Oral) Active. BD Pen Needle Nano U/F (32G X 4 MM Misc,) Active. Tretinoin Microsphere (0.04% Gel, External) Active. Famotidine (20MG  Tablet, Oral) Active. Cetirizine HCl (10MG  Capsule, Oral) Active. Medications Reconciled  Social History Jamie Hiss M. Redmond Pulling, MD; 06/27/2019 2:32 PM) Alcohol use  Occasional alcohol use. Caffeine use Carbonated beverages, Coffee, Tea. No drug use Tobacco use Never smoker.  Family History Jamie Hiss M. Redmond Pulling, MD; 06/27/2019 2:32 PM) Breast Cancer Family Members In General. Cerebrovascular Accident Brother. Heart Disease Family Members In General, Father. Hypertension Mother. Thyroid problems Mother. Diabetes Mellitus Family Members In General.  Pregnancy / Birth History Jamie Mccarthy. Redmond Pulling, MD; 06/27/2019 2:32 PM) Age at menarche 66 years. Gravida 3 Length (months) of breastfeeding 12-24 Maternal age 60-30 Para 3 4 Regular periods Irregular periods Contraceptive History Contraceptive implant.  Other Problems Jamie Hiss M. Redmond Pulling, MD; 06/27/2019 2:32 PM) Migraine Headache GASTROESOPHAGEAL REFLUX DISEASE, UNSPECIFIED WHETHER ESOPHAGITIS PRESENT (K21.9)     Review of Systems Jamie Hiss M. Giulietta Prokop MD; 06/27/2019 2:26 PM) All other systems negative  Vitals (Tanisha A. Brown RMA; 06/25/2019 3:25 PM) 06/25/2019 3:25 PM Weight: 269.8 lb Height: 64in Body Surface Area: 2.22 m Body Mass Index: 46.31 kg/m  Temp.: 64F  Pulse: 110 (Regular)  BP: 136/82(Sitting, Left Arm, Standard)        Physical Exam Jamie Hiss M. Natisha Trzcinski MD; 06/27/2019 2:23 PM)  General Mental Status-Alert. General Appearance-Consistent with stated age. Posture-Normal posture. Voice-Normal. Note: severe obesity  Integumentary Note: no rash or lesion on limited exam  Head and Neck Head -Note:atraumatic, normocephalic.  Face Strength and Tone - facial muscle strength and tone is normal.  Eye Eyeball - Bilateral-Extraocular movements intact. Sclera/Conjunctiva - Bilateral-Normal.  Chest and Lung Exam Chest and lung exam reveals -quiet, even and easy respiratory effort with no use of accessory muscles. Auscultation Breath  sounds - Normal.  Abdomen Note: soft, nontender, old incisions, no hepatosplenomegaly, no  hernia  Neuropsychiatric Mental status exam performed with findings of-able to articulate well with normal speech/language, rate, volume and coherence. The patient's mood and affect are described as -normal. Judgment and Insight-insight is appropriate concerning matters relevant to self and the patient displays appropriate judgment regarding every day activities. Thought Processes/Cognitive Function-aware of current events.  Musculoskeletal Note: strength symmetrical throughout, no deformity    Assessment & Plan Jamie Hiss M. Andora Krull MD; 06/27/2019 2:32 PM)  SEVERE OBESITY (E66.01) Impression: Please see my office note from her visit in late March or early April regarding our discussion and rationale regarding the need to remove her adjustable gastric band and convert her to a Roux-en-Y gastric bypass.  We had previously discussed revisional surgery in quite detail. Offered to rediscussed the steps of the procedure today along with risk and benefits again. She states that she was comfortable with our prior discussion as well as with additional research she has done. We did discuss that the first portion of the procedure would be to remove her band. If there is no complications during that portion of the procedure we would proceed with same-day conversion to Roux-en-Y gastric bypass. We did discuss that sometimes we have to do this in a two-stage procedure should there be issues with band removal. We also rediscussed that revision patients can be a slightly increased risk for enterotomy, bleeding, increased length of stay as well as readmission. However I think the benefits still outweigh the potential risk. We discussed the importance of the preoperative meal plan. All of her questions have been asked and answered.  This patient encounter took 23 minutes on 6/3 to perform the following: take history, perform exam, review outside records, interpret imaging, counsel the patient on their  diagnosis  Current Plans Pt Education - EMW_preopbariatric  GASTRIC BANDING STATUS (Z98.84) Story: LAP-BAND placed at [facility] in 2004; port revision; It is facing the RIGHT side of her body. It is slightly lateral to her incision and I came straight down almost at a 45 angle. Impression: We have been trying to get her band with optimal volume. Unfortunately she has a diffusely patulous esophagus with delayed passage of contrast through the band area.  Even though she is symptomatically better since we have removed the fluid from her band I still think the band needs to be surgically removed. We have tried for several years to reach the optimal volume without her having heartburn, regurgitation and have just not been successful. Moreover she is starting to have evidence of of esophageal abnormality. Therefore I think it is important to remove her band and the patient is interested in revision surgery with conversion to Roux-en-Y. I still think that is appropriate.   OSTEOARTHRITIS OF KNEE, UNILATERAL (M17.10)   GASTROESOPHAGEAL REFLUX DISEASE, UNSPECIFIED WHETHER ESOPHAGITIS PRESENT (K21.9)   DIABETES MELLITUS TYPE 2 IN OBESE (E11.69) Impression: We did discuss her blood work. We did discuss her being on the borderline of diabetes.  Jamie Mccarthy. Redmond Pulling, MD, FACS General, Bariatric, & Minimally Invasive Surgery [facility], [place]

## 2019-07-06 NOTE — Patient Instructions (Addendum)
DUE TO COVID-19 ONLY ONE VISITOR IS ALLOWED TO COME WITH YOU AND STAY IN THE WAITING ROOM ONLY DURING PRE OP AND PROCEDURE DAY OF SURGERY. THE 1 VISITOR MAY VISIT WITH YOU AFTER SURGERY IN YOUR PRIVATE ROOM DURING VISITING HOURS ONLY!  YOU NEED TO HAVE A COVID 19 TEST ON 07-09-19 @ 9:15 AM, THIS TEST MUST BE DONE BEFORE SURGERY, COME  [address], [place] , [id number].  ([place]) ONCE YOUR COVID TEST IS COMPLETED, PLEASE BEGIN THE QUARANTINE INSTRUCTIONS AS OUTLINED IN YOUR HANDOUT.                Jamie Mccarthy  07/06/2019   Your procedure is scheduled on: 07-13-19   Report to [facility] Main  Entrance    Report to Admitting at 7:30 AM    Call this number if you have problems the morning of surgery [phone]    Remember: NO SOLID FOOD AFTER 600 PM THE NIGHT BEFORE YOUR SURGERY. YOU MAY DRINK CLEAR FLUIDS. THE G2 DRINK YOU DRINK BEFORE YOU LEAVE HOME WILL BE THE LAST FLUIDS YOU DRINK BEFORE SURGERY. PLEASE DRINK AT 6:30 AM     CLEAR LIQUID DIET   Foods Allowed                                                                     Foods Excluded  Coffee and tea, regular and decaf                             liquids that you cannot  Plain Jell-O any favor except red or purple                                           see through such as: Fruit ices (not with fruit pulp)                                     milk, soups, orange juice  Iced Popsicles                                    All solid food Carbonated beverages, regular and diet                                    Cranberry, grape and apple juices Sports drinks like Gatorade Lightly seasoned clear broth or consume(fat free) Sugar, honey syrup  _____________________________________________________________________    PAIN IS EXPECTED AFTER SURGERY AND WILL NOT BE COMPLETELY ELIMINATED. AMBULATION AND TYLENOL WILL HELP REDUCE INCISIONAL AND GAS PAIN. MOVEMENT IS KEY!  YOU ARE EXPECTED TO BE  OUT OF BED WITHIN 4 HOURS OF ADMISSION TO YOUR PATIENT ROOM.  SITTING IN THE RECLINER THROUGHOUT THE DAY IS IMPORTANT FOR DRINKING FLUIDS AND MOVING GAS THROUGHOUT THE GI TRACT.  COMPRESSION STOCKINGS SHOULD BE WORN [place] UNLESS  YOU ARE WALKING.   INCENTIVE SPIROMETER SHOULD BE USED EVERY HOUR WHILE AWAKE TO DECREASE POST-OPERATIVE COMPLICATIONS SUCH AS PNEUMONIA.  WHEN DISCHARGED HOME, IT IS IMPORTANT TO CONTINUE TO WALK EVERY HOUR AND USE THE INCENTIVE SPIROMETER EVERY HOUR.      Take these medicines the morning of surgery with A SIP OF WATER: Imitrex, prn  BRUSH YOUR TEETH MORNING OF SURGERY AND RINSE YOUR MOUTH OUT, NO CHEWING GUM CANDY OR MINTS.                              You may not have any metal on your body including hair pins and              piercings     Do not wear jewelry, make-up, lotions, powders or perfumes, deodorant              Do not wear nail polish on your fingernails.  Do not shave  48 hours prior to surgery.      Do not bring valuables to the hospital. [place].  Contacts, dentures or bridgework may not be worn into surgery.  You may bring in a small overnight bag     Special Instructions: N/A              Please read over the following fact sheets you were given: _____________________________________________________________________  [facility] - Preparing for Surgery Before surgery, you can play an important role.  Because skin is not sterile, your skin needs to be as free of germs as possible.  You can reduce the number of germs on your skin by washing with CHG (chlorahexidine gluconate) soap before surgery.  CHG is an antiseptic cleaner which kills germs and bonds with the skin to continue killing germs even after washing. Please DO NOT use if you have an allergy to CHG or antibacterial soaps.  If your skin becomes reddened/irritated stop using the CHG and inform your nurse  when you arrive at Short Stay. Do not shave (including legs and underarms) for at least 48 hours prior to the first CHG shower.  You may shave your face/neck. Please follow these instructions carefully:  1.  Shower with CHG Soap the night before surgery and the  morning of Surgery.  2.  If you choose to wash your hair, wash your hair first as usual with your  normal  shampoo.  3.  After you shampoo, rinse your hair and body thoroughly to remove the  shampoo.                           4.  Use CHG as you would any other liquid soap.  You can apply chg directly  to the skin and wash                       Gently with a scrungie or clean washcloth.  5.  Apply the CHG Soap to your body ONLY FROM THE NECK DOWN.   Do not use on face/ open                           Wound or open sores. Avoid contact with eyes, ears mouth and genitals (private parts).  Wash face,  Genitals (private parts) with your normal soap.             6.  Wash thoroughly, paying special attention to the area where your surgery  will be performed.  7.  Thoroughly rinse your body with warm water from the neck down.  8.  DO NOT shower/wash with your normal soap after using and rinsing off  the CHG Soap.                9.  Pat yourself dry with a clean towel.            10.  Wear clean pajamas.            11.  Place clean sheets on your bed the night of your first shower and do not  sleep with pets. Day of Surgery : Do not apply any lotions/deodorants the morning of surgery.  Please wear clean clothes to the hospital/surgery center.  FAILURE TO FOLLOW THESE INSTRUCTIONS MAY RESULT IN THE CANCELLATION OF YOUR SURGERY PATIENT SIGNATURE_________________________________  NURSE SIGNATURE__________________________________  ________________________________________________________________________   Adam Phenix  An incentive spirometer is a tool that can help keep your lungs clear and active. This tool  measures how well you are filling your lungs with each breath. Taking long deep breaths may help reverse or decrease the chance of developing breathing (pulmonary) problems (especially infection) following:  A long period of time when you are unable to move or be active. BEFORE THE PROCEDURE   If the spirometer includes an indicator to show your best effort, your nurse or respiratory therapist will set it to a desired goal.  If possible, sit up straight or lean slightly forward. Try not to slouch.  Hold the incentive spirometer in an upright position. INSTRUCTIONS FOR USE  1. Sit on the edge of your bed if possible, or sit up as far as you can in bed or on a chair. 2. Hold the incentive spirometer in an upright position. 3. Breathe out normally. 4. Place the mouthpiece in your mouth and seal your lips tightly around it. 5. Breathe in slowly and as deeply as possible, raising the piston or the ball toward the top of the column. 6. Hold your breath for 3-5 seconds or for as long as possible. Allow the piston or ball to fall to the bottom of the column. 7. Remove the mouthpiece from your mouth and breathe out normally. 8. Rest for a few seconds and repeat Steps 1 through 7 at least 10 times every 1-2 hours when you are awake. Take your time and take a few normal breaths between deep breaths. 9. The spirometer may include an indicator to show your best effort. Use the indicator as a goal to work toward during each repetition. 10. After each set of 10 deep breaths, practice coughing to be sure your lungs are clear. If you have an incision (the cut made at the time of surgery), support your incision when coughing by placing a pillow or rolled up towels firmly against it. Once you are able to get out of bed, walk around indoors and cough well. You may stop using the incentive spirometer when instructed by your caregiver.  RISKS AND COMPLICATIONS  Take your time so you do not get dizzy or  light-headed.  If you are in pain, you may need to take or ask for pain medication before doing incentive spirometry. It is harder to take a deep breath if you are having  pain. AFTER USE  Rest and breathe slowly and easily.  It can be helpful to keep track of a log of your progress. Your caregiver can provide you with a simple table to help with this. If you are using the spirometer at home, follow these instructions: [place] IF:   You are having difficultly using the spirometer.  You have trouble using the spirometer as often as instructed.  Your pain medication is not giving enough relief while using the spirometer.  You develop fever of 100.5 F (38.1 C) or higher. SEEK IMMEDIATE MEDICAL CARE IF:   You cough up bloody sputum that had not been present before.  You develop fever of 102 F (38.9 C) or greater.  You develop worsening pain at or near the incision site. MAKE SURE YOU:   Understand these instructions.  Will watch your condition.  Will get help right away if you are not doing well or get worse. Document Released: 05/21/2006 Document Revised: 04/02/2011 Document Reviewed: 07/22/2006 ExitCare Patient Information 2014 ExitCare, [place].   ________________________________________________________________________  WHAT IS A BLOOD TRANSFUSION? Blood Transfusion Information  A transfusion is the replacement of blood or some of its parts. Blood is made up of multiple cells which provide different functions.  Red blood cells carry oxygen and are used for blood loss replacement.  White blood cells fight against infection.  Platelets control bleeding.  Plasma helps clot blood.  Other blood products are available for specialized needs, such as hemophilia or other clotting disorders. BEFORE THE TRANSFUSION  Who gives blood for transfusions?   Healthy volunteers who are fully evaluated to make sure their blood is safe. This is blood bank  blood. Transfusion therapy is the safest it has ever been in the practice of medicine. Before blood is taken from a donor, a complete history is taken to make sure that person has no history of diseases nor engages in risky social behavior (examples are intravenous drug use or sexual activity with multiple partners). The donor's travel history is screened to minimize risk of transmitting infections, such as malaria. The donated blood is tested for signs of infectious diseases, such as HIV and hepatitis. The blood is then tested to be sure it is compatible with you in order to minimize the chance of a transfusion reaction. If you or a relative donates blood, this is often done in anticipation of surgery and is not appropriate for emergency situations. It takes many days to process the donated blood. RISKS AND COMPLICATIONS Although transfusion therapy is very safe and saves many lives, the main dangers of transfusion include:   Getting an infectious disease.  Developing a transfusion reaction. This is an allergic reaction to something in the blood you were given. Every precaution is taken to prevent this. The decision to have a blood transfusion has been considered carefully by your caregiver before blood is given. Blood is not given unless the benefits outweigh the risks. AFTER THE TRANSFUSION  Right after receiving a blood transfusion, you will usually feel much better and more energetic. This is especially true if your red blood cells have gotten low (anemic). The transfusion raises the level of the red blood cells which carry oxygen, and this usually causes an energy increase.  The nurse administering the transfusion will monitor you carefully for complications. HOME CARE INSTRUCTIONS  No special instructions are needed after a transfusion. You may find your energy is better. Speak with your caregiver about any limitations on activity for underlying diseases  you may have. SEEK MEDICAL CARE IF:    Your condition is not improving after your transfusion.  You develop redness or irritation at the intravenous (IV) site. SEEK IMMEDIATE MEDICAL CARE IF:  Any of the following symptoms occur over the next 12 hours:  Shaking chills.  You have a temperature by mouth above 102 F (38.9 C), not controlled by medicine.  Chest, back, or muscle pain.  People around you feel you are not acting correctly or are confused.  Shortness of breath or difficulty breathing.  Dizziness and fainting.  You get a rash or develop hives.  You have a decrease in urine output.  Your urine turns a dark color or changes to pink, red, or brown. Any of the following symptoms occur over the next 10 days:  You have a temperature by mouth above 102 F (38.9 C), not controlled by medicine.  Shortness of breath.  Weakness after normal activity.  The white part of the eye turns yellow (jaundice).  You have a decrease in the amount of urine or are urinating less often.  Your urine turns a dark color or changes to pink, red, or brown. Document Released: 01/06/2000 Document Revised: 04/02/2011 Document Reviewed: 08/25/2007 [facility] Patient Information 2014 [place], [place].  _______________________________________________________________________

## 2019-07-06 NOTE — Progress Notes (Addendum)
PCP - Beatrice Lecher, MD Cardiologist - N/A  PPM/ICD -  Device Orders -  Rep Notified -   Chest x-ray -  EKG -  Stress Test -  ECHO -  Cardiac Cath -   Sleep Study -  CPAP -   Fasting Blood Sugar -  Checks Blood Sugar _____ times a day  Blood Thinner Instructions: Aspirin Instructions:  ERAS Protcol - PRE-SURGERY Ensure or G2-   COVID TEST-  Covid Vaccination x 2 Pfizer  Anesthesia review:   Patient denies shortness of breath, fever, cough and chest pain at PAT appointment   All instructions explained to the patient, with a verbal understanding of the material. Patient agrees to go over the instructions while at home for a better understanding. Patient also instructed to self quarantine after being tested for COVID-19. The opportunity to ask questions was provided.

## 2019-07-07 ENCOUNTER — Encounter (HOSPITAL_COMMUNITY)
Admission: RE | Admit: 2019-07-07 | Discharge: 2019-07-07 | Disposition: A | Discharge status: Home / Self Care | Payer: 59 | Source: Ambulatory Visit | Attending: General Surgery | Admitting: General Surgery

## 2019-07-07 ENCOUNTER — Encounter (HOSPITAL_COMMUNITY): Payer: Self-pay

## 2019-07-07 ENCOUNTER — Other Ambulatory Visit: Payer: Self-pay

## 2019-07-08 ENCOUNTER — Other Ambulatory Visit: Payer: Self-pay | Admitting: *Deleted

## 2019-07-08 MED ORDER — FLUOCINOLONE ACETONIDE 0.01 % OT OIL: 3.0000 [drp] | TOPICAL_OIL | Freq: Every day | OTIC | 0 refills | Status: DC

## 2019-07-09 ENCOUNTER — Other Ambulatory Visit: Payer: Self-pay

## 2019-07-09 ENCOUNTER — Other Ambulatory Visit (HOSPITAL_COMMUNITY)
Admission: RE | Admit: 2019-07-09 | Discharge: 2019-07-09 | Disposition: A | Discharge status: Home / Self Care | Payer: 59 | Source: Ambulatory Visit | Attending: General Surgery | Admitting: General Surgery

## 2019-07-09 ENCOUNTER — Encounter (HOSPITAL_COMMUNITY)
Admission: RE | Admit: 2019-07-09 | Discharge: 2019-07-09 | Disposition: A | Discharge status: Home / Self Care | Payer: 59 | Source: Ambulatory Visit | Attending: General Surgery | Admitting: General Surgery

## 2019-07-09 DIAGNOSIS — Z01818 Encounter for other preprocedural examination: Secondary | ICD-10-CM | POA: Diagnosis not present

## 2019-07-09 DIAGNOSIS — I10 Essential (primary) hypertension: Secondary | ICD-10-CM | POA: Insufficient documentation

## 2019-07-09 DIAGNOSIS — Z20822 Contact with and (suspected) exposure to covid-19: Secondary | ICD-10-CM | POA: Insufficient documentation

## 2019-07-09 LAB — CBC WITH DIFFERENTIAL/PLATELET
Abs Immature Granulocytes: 0.02 10*3/uL (ref 0.00–0.07)
Basophils Absolute: 0 10*3/uL (ref 0.0–0.1)
Basophils Relative: 1 %
Eosinophils Absolute: 0.3 10*3/uL (ref 0.0–0.5)
Eosinophils Relative: 3 %
HCT: 40.4 % (ref 36.0–46.0)
Hemoglobin: 13 g/dL (ref 12.0–15.0)
Immature Granulocytes: 0 %
Lymphocytes Relative: 36 %
Lymphs Abs: 2.8 10*3/uL (ref 0.7–4.0)
MCH: 25.5 pg — ABNORMAL LOW (ref 26.0–34.0)
MCHC: 32.2 g/dL (ref 30.0–36.0)
MCV: 79.2 fL — ABNORMAL LOW (ref 80.0–100.0)
Monocytes Absolute: 0.5 10*3/uL (ref 0.1–1.0)
Monocytes Relative: 7 %
Neutro Abs: 4.2 10*3/uL (ref 1.7–7.7)
Neutrophils Relative %: 53 %
Platelets: 325 10*3/uL (ref 150–400)
RBC: 5.1 MIL/uL (ref 3.87–5.11)
RDW: 13.5 % (ref 11.5–15.5)
WBC: 7.9 10*3/uL (ref 4.0–10.5)
nRBC: 0 % (ref 0.0–0.2)

## 2019-07-09 LAB — COMPREHENSIVE METABOLIC PANEL
ALT: 20 U/L (ref 0–44)
AST: 19 U/L (ref 15–41)
Albumin: 3.7 g/dL (ref 3.5–5.0)
Alkaline Phosphatase: 69 U/L (ref 38–126)
Anion gap: 9 (ref 5–15)
BUN: 18 mg/dL (ref 6–20)
CO2: 25 mmol/L (ref 22–32)
Calcium: 8.9 mg/dL (ref 8.9–10.3)
Chloride: 103 mmol/L (ref 98–111)
Creatinine, Ser: 0.67 mg/dL (ref 0.44–1.00)
GFR calc Af Amer: >60 mL/min (ref >60)
GFR calc non Af Amer: >60 mL/min (ref >60)
Glucose, Bld: 286 mg/dL — ABNORMAL HIGH (ref 70–99)
Potassium: 4.4 mmol/L (ref 3.5–5.1)
Sodium: 137 mmol/L (ref 135–145)
Total Bilirubin: 0.8 mg/dL (ref 0.3–1.2)
Total Protein: 6.9 g/dL (ref 6.5–8.1)

## 2019-07-09 LAB — SARS CORONAVIRUS 2 (TAT 6-24 HRS): SARS Coronavirus 2: NEGATIVE

## 2019-07-09 LAB — ABO/RH: ABO/RH(D): O POS

## 2019-07-10 ENCOUNTER — Encounter (HOSPITAL_COMMUNITY): Payer: Self-pay | Admitting: Physician Assistant

## 2019-07-10 ENCOUNTER — Encounter: Payer: Self-pay | Admitting: Family Medicine

## 2019-07-10 LAB — HEMOGLOBIN A1C
Hgb A1c MFr Bld: 10.4 % — ABNORMAL HIGH (ref 4.8–5.6)
Mean Plasma Glucose: 252 mg/dL

## 2019-07-10 NOTE — Telephone Encounter (Signed)
[place] for work note through [employer], since has appt on Tues.  We can figure out waht is going on and have a plan from therea.

## 2019-07-10 NOTE — Progress Notes (Signed)
Chart given to Konrad Felix due to HgbA1c 10.4 on 07/09/2019. Spoke with Abigail Butts at Dr. Dois Davenport office to make them aware. Dr. Redmond Pulling is out of the office until Monday.

## 2019-07-11 NOTE — Telephone Encounter (Signed)
-----   Message from Leontine Locket, [place] sent at 07/10/2019  9:24 AM EDT ----- Regarding: Newly Dx Jamie Mccarthy was scheduled for bariatric surgery this morning. The surgery was cancelled due to increased A1C. A1C = 10.4 up from 5.6.   She was very upset about the cancelled surgery. I explained that there were no openings until next week. Her appt is set for Tuesday.

## 2019-07-13 ENCOUNTER — Inpatient Hospital Stay (HOSPITAL_COMMUNITY): Admission: RE | Admit: 2019-07-13 | Payer: 59 | Source: Home / Self Care | Admitting: General Surgery

## 2019-07-13 LAB — TYPE AND SCREEN
ABO/RH(D): O POS
Antibody Screen: NEGATIVE

## 2019-07-13 SURGERY — LAPAROSCOPIC ROUX-EN-Y GASTRIC BYPASS WITH UPPER ENDOSCOPY AND REMOVAL OF LAP BAND
Anesthesia: General

## 2019-07-14 ENCOUNTER — Encounter: Payer: Self-pay | Admitting: Family Medicine

## 2019-07-14 ENCOUNTER — Other Ambulatory Visit: Payer: Self-pay

## 2019-07-14 ENCOUNTER — Ambulatory Visit (INDEPENDENT_AMBULATORY_CARE_PROVIDER_SITE_OTHER): Payer: 59 | Admitting: Family Medicine

## 2019-07-14 VITALS — BP 133/91 | HR 94 | Ht 64.0 in | Wt 267.0 lb

## 2019-07-14 DIAGNOSIS — E1165 Type 2 diabetes mellitus with hyperglycemia: Secondary | ICD-10-CM | POA: Diagnosis not present

## 2019-07-14 DIAGNOSIS — R7309 Other abnormal glucose: Secondary | ICD-10-CM

## 2019-07-14 DIAGNOSIS — Z8639 Personal history of other endocrine, nutritional and metabolic disease: Secondary | ICD-10-CM | POA: Insufficient documentation

## 2019-07-14 LAB — POCT GLYCOSYLATED HEMOGLOBIN (HGB A1C): Hemoglobin A1C: 10.6 % — AB (ref 4.0–5.6)

## 2019-07-14 MED ORDER — OZEMPIC (0.25 OR 0.5 MG/DOSE) 2 MG/1.5ML ~~LOC~~ SOPN: 0.2500 mg | PEN_INJECTOR | SUBCUTANEOUS | 1 refills | Status: DC

## 2019-07-14 MED ORDER — BLOOD GLUCOSE MONITOR KIT: PACK | 99 refills | Status: DC

## 2019-07-14 MED ORDER — DAPAGLIFLOZIN PRO-METFORMIN ER 5-500 MG PO TB24: 1.0000 | ORAL_TABLET | Freq: Every morning | ORAL | 1 refills | Status: DC

## 2019-07-14 NOTE — Assessment & Plan Note (Signed)
We did confirm her diagnosis.  She actually had a completely normal A1c a little over a year ago she does have a history of gestational diabetes which certainly puts her at risk.  We will send over new prescription for glucometer, lancets and strips.  We will also go ahead and start Ozempic.  Did let her know it can take a couple weeks to start to see the numbers improve in addition I am going to send over a combination of Metformin with low-dose [place].  Did warn to monitor for any GI side effects.  I want to get really aggressive in bringing her blood sugar down to monitor check her sugars daily to see for trending in the right direction.  She will see me know in a couple weeks and let me know what they are doing otherwise I will see her back in 6 weeks.  Continue work on low sugar low-carb diet and getting regular exercise.

## 2019-07-14 NOTE — Progress Notes (Signed)
Established Patient Office Visit  Subjective:  Patient ID: Jamie Mccarthy, female    DOB: 1975-01-26  Age: 44 y.o. MRN: [id number]  CC:  Chief Complaint  Patient presents with  . Follow-up    HPI Jamie Mccarthy presents for New diagnosis fo diabetes.  She was scheduled for gastric Roux-en-Y and when she went for the day of her surgery the did an A1c and found that it was elevated to 10.4.  She had no prior history of diabetes.  She has gained some weight over this last year as her gastric band basically quit working.  Since she found out that her A1c was elevated last week she has been using her daughter's glucometer who has type 1 diabetes and most of her blood sugars have been running in the low to mid 200s.  She says looking back retrospectively she thinks maybe over the last 2 months she actually has been experiencing some increased thirst and urination.  She is also been having more frequent headaches as well as just extreme fatigue.  Since finding out her A1c was elevated she is really cut back on carbs and sweets.  She is been trying to get regular protein in.  Lab Results  Component Value Date   HGBA1C 10.6 (A) 07/14/2019     Past Medical History:  Diagnosis Date  . Anemia    Has sickle cell trait  . GERD (gastroesophageal reflux disease)    diet controlled  . Gestational diabetes   . H/O vaginal delivery 2010  . Headache(784.0)    Migraines  . Herpes 2007   hx of  . Hypertension    no current medications  . Iron deficiency anemia, unspecified 10/20/2012  . Obese   . PONV (postoperative nausea and vomiting)    has had PONV with previous c/s and also lap band surgery 2004  . Seasonal allergies     Past Surgical History:  Procedure Laterality Date  . BILATERAL SALPINGECTOMY  07/16/2012   Procedure: BILATERAL DISTAL SALPINGECTOMY;  Surgeon: Logan Bores, MD;  Location: [facility];  Service: Obstetrics;;  . CESAREAN SECTION  2012  . CESAREAN SECTION N/A  07/16/2012   Procedure: REPEAT CESAREAN SECTION;  Surgeon: Logan Bores, MD;  Location: [facility];  Service: Obstetrics;  Laterality: N/A;  . CHOLECYSTECTOMY N/A 11/14/2012   Procedure: LAPAROSCOPIC CHOLECYSTECTOMY;  Surgeon: Gayland Curry, MD;  Location: [facility];  Service: General;  Laterality: N/A;  . GASTRIC BANDING PORT REVISION N/A 11/14/2012   Procedure: GASTRIC BANDING PORT REVISION;  Surgeon: Gayland Curry, MD;  Location: [facility];  Service: General;  Laterality: N/A;  PORT PLACEMENT MOVED NEW MESH INSERTED   . LAPAROSCOPIC GASTRIC BANDING  2005  . LAPAROSCOPIC VAGINAL HYSTERECTOMY WITH SALPINGECTOMY Bilateral 10/12/2015   Procedure: HYSTERECTOMY TOTAL LAPAROSCOPIC BILATERAL SALPINGECTOMY;  Surgeon: Sherlyn Hay, DO;  Location: [facility];  Service: Gynecology;  Laterality: Bilateral;  . reposition gastric band  2006   x2  . TUBAL LIGATION  2014  . WISDOM TOOTH EXTRACTION  1999    Family History  Problem Relation Age of Onset  . Hypertension Mother   . Diabetes Other        father's family  . Thyroid cancer Other   . Breast cancer Maternal Grandmother     Social History   Socioeconomic History  . Marital status: Married    Spouse name: Not on file  . Number of children: Not on file  . Years of  education: Not on file  . Highest education level: Not on file  Occupational History  . Occupation: Theatre manager: [employer]  Tobacco Use  . Smoking status: Never Smoker  . Smokeless tobacco: Never Used  Vaping Use  . Vaping Use: Never used  Substance and Sexual Activity  . Alcohol use: Yes    Comment: occ  . Drug use: No  . Sexual activity: Yes    Comment: separated, 2 caffeine drinks daily.   Other Topics Concern  . Not on file  Social History Narrative   2 caffeine drinks per day   Social Determinants of Health   Financial Resource Strain:   . Difficulty of Paying Living Expenses:   Food Insecurity:   . Worried About Charity fundraiser in the  Last Year:   . Arboriculturist in the Last Year:   Transportation Needs:   . Film/video editor (Medical):   Marland Kitchen Lack of Transportation (Non-Medical):   Physical Activity:   . Days of Exercise per Week:   . Minutes of Exercise per Session:   Stress:   . Feeling of Stress :   Social Connections:   . Frequency of Communication with Friends and Family:   . Frequency of Social Gatherings with Friends and Family:   . Attends Religious Services:   . Active Member of Clubs or Organizations:   . Attends Archivist Meetings:   Marland Kitchen Marital Status:   Intimate Partner Violence:   . Fear of Current or Ex-Partner:   . Emotionally Abused:   Marland Kitchen Physically Abused:   . Sexually Abused:     Outpatient Medications Prior to Visit  Medication Sig Dispense Refill  . acetaminophen (TYLENOL) 650 MG CR tablet Take 1 tablet (650 mg total) by mouth every 8 (eight) hours as needed for pain. 90 tablet 3  . cetirizine (ZYRTEC) 10 MG tablet Take 1 tablet twice daily for 14 days then reduce to 1 tablet once daily. (Patient taking differently: Take 10 mg by mouth daily. ) 42 tablet 0  . Fluocinolone Acetonide 0.01 % OIL Place 3 drops in ear(s) at bedtime. 20 mL 0  . Insulin Pen Needle (BD PEN NEEDLE NANO U/F) 32G X 4 MM MISC USE TO INJECT INTO SKIN DAILY AS DIRECTED 100 each 2  . ketoconazole (NIZORAL) 2 % shampoo Apply to scalp twice a week for 8 weeks and then weekly thereafter (Patient taking differently: Apply 1 application topically once a week. ) 120 mL 11  . levocetirizine (XYZAL) 5 MG tablet TAKE 1 TABLET BY MOUTH EVERY DAY IN THE EVENING (Patient taking differently: Take 5 mg by mouth every evening. ) 90 tablet 1  . Probiotic Product (PROBIOTIC PO) Take 1 tablet by mouth daily.    . SUMAtriptan (IMITREX) 100 MG tablet TAKE 1 TABLET BY MOUTH EVERY 2HRS AS NEEDED FOR MIGRAINE. (MAY REPEAT IN 2HRS IF HEADACHE PERSIST) (Patient taking differently: Take 100 mg by mouth every 2 (two) hours as needed for  migraine. TAKE 1 TABLET BY MOUTH EVERY 2HRS AS NEEDED FOR MIGRAINE. (MAY REPEAT IN 2HRS IF HEADACHE PERSIST)) 10 tablet 2  . traZODone (DESYREL) 50 MG tablet TAKE 1/2 OR 2 TABLETS (25-100 MG) BY MOUTH AT BEDTIME AS NEEDED FOR SLEEP. (Patient taking differently: 25-50 mg at bedtime as needed. ) 180 tablet 1  . tretinoin microspheres (RETIN-A MICRO) 0.04 % gel APPLY TO AFFECTED AREA EVERY DAY AT BEDTIME (Patient taking differently: Apply 1  application topically at bedtime. ) 45 g 0  . clotrimazole-betamethasone (LOTRISONE) cream Apply 1 application topically 2 (two) times daily. (Patient not taking: Reported on 05/15/2019) 45 g 0  . famotidine (PEPCID) 20 MG tablet Take 1 tablet (20 mg total) by mouth 2 (two) times daily. (Patient not taking: Reported on 07/03/2019) 60 tablet 1  . Liraglutide -Weight Management (SAXENDA) 18 MG/3ML SOPN Inject 3 mg into the skin daily. 15 mL 5  . predniSONE (DELTASONE) 50 MG tablet Take 1 tablet (50 mg total) by mouth daily. (Patient not taking: Reported on 07/03/2019) 5 tablet 0   No facility-administered medications prior to visit.    Allergies  Allergen Reactions  . Latex Itching    Pt states she gets severely itchy with latex  . Naproxen Rash    ROS Review of Systems    Objective:    Physical Exam Vitals reviewed.  Constitutional:      Appearance: She is well-developed.  HENT:     Head: Normocephalic and atraumatic.  Eyes:     Conjunctiva/sclera: Conjunctivae normal.  Cardiovascular:     Rate and Rhythm: Normal rate.  Pulmonary:     Effort: Pulmonary effort is normal.  Skin:    General: Skin is dry.     Coloration: Skin is not pale.  Neurological:     Mental Status: She is alert and oriented to person, place, and time.  Psychiatric:        Behavior: Behavior normal.     BP (!) 133/91   Pulse 94   Ht 5' 4"  (1.626 m)   Wt 267 lb (121.1 kg)   LMP 08/29/2015 (Approximate)   SpO2 100%   BMI 45.83 kg/m  Wt Readings from Last 3  Encounters:  07/14/19 267 lb (121.1 kg)  07/09/19 268 lb (121.6 kg)  07/07/19 269 lb (122 kg)     Health Maintenance Due  Topic Date Due  . Hepatitis C Screening  Never done  . PNEUMOCOCCAL POLYSACCHARIDE VACCINE AGE 53-64 HIGH RISK  Never done  . FOOT EXAM  Never done  . OPHTHALMOLOGY EXAM  Never done    There are no preventive care reminders to display for this patient.  Lab Results  Component Value Date   TSH 0.78 07/09/2018   Lab Results  Component Value Date   WBC 7.9 07/09/2019   HGB 13.0 07/09/2019   HCT 40.4 07/09/2019   MCV 79.2 (L) 07/09/2019   PLT 325 07/09/2019   Lab Results  Component Value Date   NA 137 07/09/2019   K 4.4 07/09/2019   CO2 25 07/09/2019   GLUCOSE 286 (H) 07/09/2019   BUN 18 07/09/2019   CREATININE 0.67 07/09/2019   BILITOT 0.8 07/09/2019   ALKPHOS 69 07/09/2019   AST 19 07/09/2019   ALT 20 07/09/2019   PROT 6.9 07/09/2019   ALBUMIN 3.7 07/09/2019   CALCIUM 8.9 07/09/2019   ANIONGAP 9 07/09/2019   Lab Results  Component Value Date   CHOL 171 07/09/2018   Lab Results  Component Value Date   HDL 57 07/09/2018   Lab Results  Component Value Date   LDLCALC 99 07/09/2018   Lab Results  Component Value Date   TRIG 60 07/09/2018   Lab Results  Component Value Date   CHOLHDL 3.0 07/09/2018   Lab Results  Component Value Date   HGBA1C 10.6 (A) 07/14/2019      Assessment & Plan:   Problem List Items Addressed This Visit  Endocrine   Uncontrolled diabetes mellitus ([place])    We did confirm her diagnosis.  She actually had a completely normal A1c a little over a year ago she does have a history of gestational diabetes which certainly puts her at risk.  We will send over new prescription for glucometer, lancets and strips.  We will also go ahead and start Ozempic.  Did let her know it can take a couple weeks to start to see the numbers improve in addition I am going to send over a combination of Metformin with low-dose  [place].  Did warn to monitor for any GI side effects.  I want to get really aggressive in bringing her blood sugar down to monitor check her sugars daily to see for trending in the right direction.  She will see me know in a couple weeks and let me know what they are doing otherwise I will see her back in 6 weeks.  Continue work on low sugar low-carb diet and getting regular exercise.      Relevant Medications   Semaglutide,0.25 or 0.5MG/DOS, (OZEMPIC, 0.25 OR 0.5 MG/DOSE,) 2 MG/1.5ML SOPN   Dapagliflozin-metFORMIN HCl ER 5-500 MG TB24   blood glucose meter kit and supplies KIT    Other Visit Diagnoses    Abnormal glucose    -  Primary   Relevant Orders   POCT glycosylated hemoglobin (Hb A1C) (Completed)     We will approve for her to be out of work for 2 weeks starting June 17 to return on July 2.  She will have paperwork faxed over to her office for FMLA.  Meds ordered this encounter  Medications  . Semaglutide,0.25 or 0.5MG/DOS, (OZEMPIC, 0.25 OR 0.5 MG/DOSE,) 2 MG/1.5ML SOPN    Sig: Inject 0.1875 mLs (0.25 mg total) into the skin every 7 (seven) days. Increase to 0.74m after 4 weeks.    Dispense:  2 pen    Refill:  1  . Dapagliflozin-metFORMIN HCl ER 5-500 MG TB24    Sig: Take 1 tablet by mouth in the morning.    Dispense:  30 tablet    Refill:  1  . blood glucose meter kit and supplies KIT    Sig: Dispense based on patient and insurance preference. Use up to twice times daily as directed. (FOR ICD-9 250.00, 250.01).    Dispense:  1 each    Refill:  PRN    Order Specific Question:   Number of strips    Answer:   100    Order Specific Question:   Number of lancets    Answer:   100    Follow-up: Return in about 6 weeks (around 08/25/2019) for Diabetes follow-up.   Time spent 25 minutes in encounter reviewing notes and in face-to-face encounter.  CBeatrice Lecher MD

## 2019-07-17 ENCOUNTER — Other Ambulatory Visit: Payer: Self-pay | Admitting: *Deleted

## 2019-07-17 DIAGNOSIS — E1165 Type 2 diabetes mellitus with hyperglycemia: Secondary | ICD-10-CM

## 2019-07-17 MED ORDER — LANCETS MISC. KIT: PACK | 0 refills | Status: DC

## 2019-07-17 MED ORDER — BLOOD GLUCOSE TEST VI STRP: ORAL_STRIP | 6 refills | Status: DC

## 2019-07-22 ENCOUNTER — Encounter: Payer: Self-pay | Admitting: Family Medicine

## 2019-07-24 ENCOUNTER — Encounter: Payer: Self-pay | Admitting: Nurse Practitioner

## 2019-07-24 ENCOUNTER — Ambulatory Visit (INDEPENDENT_AMBULATORY_CARE_PROVIDER_SITE_OTHER): Payer: 59 | Admitting: Nurse Practitioner

## 2019-07-24 ENCOUNTER — Other Ambulatory Visit: Payer: Self-pay

## 2019-07-24 VITALS — BP 123/85 | HR 83 | Temp 97.9°F | Ht 64.0 in | Wt 264.9 lb

## 2019-07-24 DIAGNOSIS — G44209 Tension-type headache, unspecified, not intractable: Secondary | ICD-10-CM

## 2019-07-24 DIAGNOSIS — B373 Candidiasis of vulva and vagina: Secondary | ICD-10-CM | POA: Diagnosis not present

## 2019-07-24 DIAGNOSIS — B372 Candidiasis of skin and nail: Secondary | ICD-10-CM

## 2019-07-24 DIAGNOSIS — E1165 Type 2 diabetes mellitus with hyperglycemia: Secondary | ICD-10-CM | POA: Diagnosis not present

## 2019-07-24 DIAGNOSIS — L509 Urticaria, unspecified: Secondary | ICD-10-CM | POA: Diagnosis not present

## 2019-07-24 DIAGNOSIS — B3731 Acute candidiasis of vulva and vagina: Secondary | ICD-10-CM

## 2019-07-24 MED ORDER — LANCETS MISC. KIT: PACK | 0 refills | Status: AC

## 2019-07-24 MED ORDER — DAPAGLIFLOZIN PRO-METFORMIN ER 5-500 MG PO TB24: 1.0000 | ORAL_TABLET | Freq: Every morning | ORAL | 2 refills | Status: DC

## 2019-07-24 MED ORDER — NYSTATIN POWD: 2 refills | Status: DC

## 2019-07-24 MED ORDER — BLOOD GLUCOSE TEST VI STRP: ORAL_STRIP | 6 refills | Status: DC

## 2019-07-24 MED ORDER — CYCLOBENZAPRINE HCL 10 MG PO TABS: 10.0000 mg | ORAL_TABLET | Freq: Every day | ORAL | 1 refills | Status: DC

## 2019-07-24 MED ORDER — FLUCONAZOLE 200 MG PO TABS: ORAL_TABLET | ORAL | 3 refills | Status: DC

## 2019-07-24 MED ORDER — CETIRIZINE HCL 10 MG PO TABS: 10.0000 mg | ORAL_TABLET | Freq: Every day | ORAL | 11 refills | Status: DC

## 2019-07-24 NOTE — Patient Instructions (Signed)
Follow-up with Dr. Madilyn Fireman in August. I will fax the letter immediately to your company.   Diflucan take 1 tab today and then 1 tab 3 days later then 1 tab 3 days later. Then take 1 tab per week for yeast.  The nystatin powder can be applied under the breasts and any areas of skin yeast infection on dry skin up to twice a day.

## 2019-07-24 NOTE — Progress Notes (Addendum)
Acute Office Visit  Subjective:    Patient ID: Jamie Mccarthy, female    DOB: 01-Sep-1975, 44 y.o.   MRN: [id number]  Chief Complaint  Patient presents with  . Work Note    was supposed to have surgery in June but was cancelled due to a high A1C, pt saw Dr. Madilyn Fireman who took her out of work for a couple of weeks and now needs a note to return to work    HPI Patient is in today for clearance letter to return back to work after taking an extended leave of absence due to new onset of type 2 diabetes and the emotional distress this brought. She reports that she is feeling better, but is anxious about returning to work. She has been taking her medication as prescribed and her blood glucose levels have improved to the 107-130 range with the changes she has made.   She does report increased vaginal discharge and itching for the past several weeks in her vulva. She has also noticed an itchy rash under her breasts and in skin folds that has been present for a few weeks. She is unsure if this is a side effect of medication or something else.   She has also had a dull band like frontal headache for the past two weeks that does not really respond to ibuprofen or tylenol. She reports she wakes with the headache in the mornings. She is able to function with the headache, but is tired of constantly having pain in her head.   Past Medical History:  Diagnosis Date  . Anemia    Has sickle cell trait  . GERD (gastroesophageal reflux disease)    diet controlled  . Gestational diabetes   . H/O vaginal delivery 2010  . Headache(784.0)    Migraines  . Herpes 2007   hx of  . Hypertension    no current medications  . Iron deficiency anemia, unspecified 10/20/2012  . Obese   . PONV (postoperative nausea and vomiting)    has had PONV with previous c/s and also lap band surgery 2004  . Seasonal allergies     Past Surgical History:  Procedure Laterality Date  . BILATERAL SALPINGECTOMY  07/16/2012    Procedure: BILATERAL DISTAL SALPINGECTOMY;  Surgeon: Logan Bores, MD;  Location: [facility];  Service: Obstetrics;;  . CESAREAN SECTION  2012  . CESAREAN SECTION N/A 07/16/2012   Procedure: REPEAT CESAREAN SECTION;  Surgeon: Logan Bores, MD;  Location: [facility];  Service: Obstetrics;  Laterality: N/A;  . CHOLECYSTECTOMY N/A 11/14/2012   Procedure: LAPAROSCOPIC CHOLECYSTECTOMY;  Surgeon: Gayland Curry, MD;  Location: [facility];  Service: General;  Laterality: N/A;  . GASTRIC BANDING PORT REVISION N/A 11/14/2012   Procedure: GASTRIC BANDING PORT REVISION;  Surgeon: Gayland Curry, MD;  Location: [facility];  Service: General;  Laterality: N/A;  PORT PLACEMENT MOVED NEW MESH INSERTED   . LAPAROSCOPIC GASTRIC BANDING  2005  . LAPAROSCOPIC VAGINAL HYSTERECTOMY WITH SALPINGECTOMY Bilateral 10/12/2015   Procedure: HYSTERECTOMY TOTAL LAPAROSCOPIC BILATERAL SALPINGECTOMY;  Surgeon: Sherlyn Hay, DO;  Location: [facility];  Service: Gynecology;  Laterality: Bilateral;  . reposition gastric band  2006   x2  . TUBAL LIGATION  2014  . WISDOM TOOTH EXTRACTION  1999    Family History  Problem Relation Age of Onset  . Hypertension Mother   . Diabetes Other        father's family  . Thyroid cancer Other   . Breast  cancer Maternal Grandmother     Social History   Socioeconomic History  . Marital status: Married    Spouse name: Not on file  . Number of children: Not on file  . Years of education: Not on file  . Highest education level: Not on file  Occupational History  . Occupation: Theatre manager: [employer]  Tobacco Use  . Smoking status: Never Smoker  . Smokeless tobacco: Never Used  Vaping Use  . Vaping Use: Never used  Substance and Sexual Activity  . Alcohol use: Yes    Comment: occ  . Drug use: No  . Sexual activity: Yes    Comment: separated, 2 caffeine drinks daily.   Other Topics Concern  . Not on file  Social History Narrative   2 caffeine drinks per day    Social Determinants of Health   Financial Resource Strain:   . Difficulty of Paying Living Expenses:   Food Insecurity:   . Worried About Charity fundraiser in the Last Year:   . Arboriculturist in the Last Year:   Transportation Needs:   . Film/video editor (Medical):   Marland Kitchen Lack of Transportation (Non-Medical):   Physical Activity:   . Days of Exercise per Week:   . Minutes of Exercise per Session:   Stress:   . Feeling of Stress :   Social Connections:   . Frequency of Communication with Friends and Family:   . Frequency of Social Gatherings with Friends and Family:   . Attends Religious Services:   . Active Member of Clubs or Organizations:   . Attends Archivist Meetings:   Marland Kitchen Marital Status:   Intimate Partner Violence:   . Fear of Current or Ex-Partner:   . Emotionally Abused:   Marland Kitchen Physically Abused:   . Sexually Abused:     Outpatient Medications Prior to Visit  Medication Sig Dispense Refill  . acetaminophen (TYLENOL) 650 MG CR tablet Take 1 tablet (650 mg total) by mouth every 8 (eight) hours as needed for pain. 90 tablet 3  . blood glucose meter kit and supplies KIT Dispense based on patient and insurance preference. Use up to twice times daily as directed. (FOR ICD-9 250.00, 250.01). 1 each PRN  . cetirizine (ZYRTEC) 10 MG tablet Take 1 tablet twice daily for 14 days then reduce to 1 tablet once daily. (Patient taking differently: Take 10 mg by mouth daily. ) 42 tablet 0  . Dapagliflozin-metFORMIN HCl ER 5-500 MG TB24 Take 1 tablet by mouth in the morning. 30 tablet 1  . Fluocinolone Acetonide 0.01 % OIL Place 3 drops in ear(s) at bedtime. 20 mL 0  . Glucose Blood (BLOOD GLUCOSE TEST STRIPS) STRP For testing blood sugars twice daily. Dx: E11.65 200 strip 6  . Insulin Pen Needle (BD PEN NEEDLE NANO U/F) 32G X 4 MM MISC USE TO INJECT INTO SKIN DAILY AS DIRECTED 100 each 2  . ketoconazole (NIZORAL) 2 % shampoo Apply to scalp twice a week for 8 weeks and  then weekly thereafter (Patient taking differently: Apply 1 application topically once a week. ) 120 mL 11  . Lancets Misc. KIT For testing blood sugars up to twice daily. Dx:E11.65 1 kit 0  . levocetirizine (XYZAL) 5 MG tablet TAKE 1 TABLET BY MOUTH EVERY DAY IN THE EVENING (Patient taking differently: Take 5 mg by mouth every evening. ) 90 tablet 1  . Probiotic Product (PROBIOTIC PO) Take 1  tablet by mouth daily.    . Semaglutide,0.25 or 0.5MG/DOS, (OZEMPIC, 0.25 OR 0.5 MG/DOSE,) 2 MG/1.5ML SOPN Inject 0.1875 mLs (0.25 mg total) into the skin every 7 (seven) days. Increase to 0.27m after 4 weeks. 2 pen 1  . SUMAtriptan (IMITREX) 100 MG tablet TAKE 1 TABLET BY MOUTH EVERY 2HRS AS NEEDED FOR MIGRAINE. (MAY REPEAT IN 2HRS IF HEADACHE PERSIST) (Patient taking differently: Take 100 mg by mouth every 2 (two) hours as needed for migraine. TAKE 1 TABLET BY MOUTH EVERY 2HRS AS NEEDED FOR MIGRAINE. (MAY REPEAT IN 2HRS IF HEADACHE PERSIST)) 10 tablet 2  . tretinoin microspheres (RETIN-A MICRO) 0.04 % gel APPLY TO AFFECTED AREA EVERY DAY AT BEDTIME (Patient taking differently: Apply 1 application topically at bedtime. ) 45 g 0  . traZODone (DESYREL) 50 MG tablet TAKE 1/2 OR 2 TABLETS (25-100 MG) BY MOUTH AT BEDTIME AS NEEDED FOR SLEEP. (Patient not taking: Reported on 07/24/2019) 180 tablet 1   No facility-administered medications prior to visit.    Allergies  Allergen Reactions  . Latex Itching    Pt states she gets severely itchy with latex  . Naproxen Rash      Objective:    Physical Exam Vitals and nursing note reviewed.  Constitutional:      Appearance: Normal appearance. She is obese.  HENT:     Head: Normocephalic.     Mouth/Throat:     Mouth: Mucous membranes are moist.  Eyes:     Extraocular Movements: Extraocular movements intact.     Conjunctiva/sclera: Conjunctivae normal.     Pupils: Pupils are equal, round, and reactive to light.  Cardiovascular:     Rate and Rhythm: Normal rate.      Pulses: Normal pulses.  Pulmonary:     Effort: Pulmonary effort is normal.  Musculoskeletal:        General: Normal range of motion.     Cervical back: Normal range of motion.  Skin:    General: Skin is warm and dry.     Findings: Rash present.  Neurological:     General: No focal deficit present.     Mental Status: She is alert and oriented to person, place, and time.  Psychiatric:        Mood and Affect: Mood normal.        Behavior: Behavior normal.        Thought Content: Thought content normal.        Judgment: Judgment normal.     BP 123/85   Pulse 83   Temp 97.9 F (36.6 C) (Oral)   Ht 5' 4"  (1.626 m)   Wt 264 lb 14.4 oz (120.2 kg)   LMP 08/29/2015 (Approximate)   SpO2 100%   BMI 45.47 kg/m  Wt Readings from Last 3 Encounters:  07/24/19 264 lb 14.4 oz (120.2 kg)  07/14/19 267 lb (121.1 kg)  07/09/19 268 lb (121.6 kg)    There are no preventive care reminders to display for this patient.  There are no preventive care reminders to display for this patient.   Lab Results  Component Value Date   TSH 0.78 07/09/2018   Lab Results  Component Value Date   WBC 7.9 07/09/2019   HGB 13.0 07/09/2019   HCT 40.4 07/09/2019   MCV 79.2 (L) 07/09/2019   PLT 325 07/09/2019   Lab Results  Component Value Date   NA 137 07/09/2019   K 4.4 07/09/2019   CO2 25 07/09/2019   GLUCOSE 286 (  H) 07/09/2019   BUN 18 07/09/2019   CREATININE 0.67 07/09/2019   BILITOT 0.8 07/09/2019   ALKPHOS 69 07/09/2019   AST 19 07/09/2019   ALT 20 07/09/2019   PROT 6.9 07/09/2019   ALBUMIN 3.7 07/09/2019   CALCIUM 8.9 07/09/2019   ANIONGAP 9 07/09/2019   Lab Results  Component Value Date   CHOL 171 07/09/2018   Lab Results  Component Value Date   HDL 57 07/09/2018   Lab Results  Component Value Date   LDLCALC 99 07/09/2018   Lab Results  Component Value Date   TRIG 60 07/09/2018   Lab Results  Component Value Date   CHOLHDL 3.0 07/09/2018   Lab Results   Component Value Date   HGBA1C 10.6 (A) 07/14/2019       Assessment & Plan:   1. Vaginal candidiasis Symptoms and presentation consistent with vaginal candidiasis most likely exacerbated by increased blood glucose levels and the use of dapagliflozin for management of her diabetes. Discussed the effects of excess glucose in the urine and the mechanism of action of medication that may increase the risk of vaginal yeast infection. I am hopeful that with better control of her blood sugar levels this symptom will subside; if not, consider tapering dapagliflozin from medications first. Patient reports that she typically must utilize several high dose treatments of diflucan for vaginal candidiasis for complete treatment. Given this information and the increased glucose release from the urine due to medication, we will trial a multi-week course of treatment for vaginal candida. We will also treat skin folds with topical nystatin powder application.   PLAN: - fluconazole (DIFLUCAN) 200 MG tablet; Take 1 tablet (226m) by mouth every 3 days for 3 doses ( 6 day span) then take 1 tablet by mouth once a week.  Dispense: 6 tablet; Refill: 3 - Nystatin POWD; Apply liberally to affected skin twice a day.  Dispense: 1 each; Refill: 2 - Keep working on blood sugar management towards a goal of blood glucose average between 70-120.  - Drink plenty of fluids to ensure proper hydration and to dilute the glucose in the urine.  - Consider utilizing warm water bottle to rinse perineum after voiding to rinse excess glucose away from the vagina and perineal area.  - Follow-up if symptoms worsen or fail to improve.  2. Candidal skin infection Candida infection to the folds of the skin most likely exacerbated by recent elevated blood glucose levels from uncontrolled type 2 DM. Her blood sugars are under much better control now that she is on medication and monitoring her diet. Discussed keeping folds of skin clean and dry  and utilizing oral fluconazole in addition to topical nystatin powder to the affected areas twice a day. I am hopeful that this will help clear the infection present and regulation of her blood sugars will prevent recurrence.   PLAN: - Nystatin POWD; Apply liberally to affected skin twice a day.  Dispense: 1 each; Refill: 2 - Follow-up if symptoms worsen or fail to improve  3. Tension headache Tension type headache daily since new diagnosis of DM2. She has been under significant increased stress with the new diagnosis and having to postpone her weight loss surgery. It is likely the tension has caused this headache. She may utilize heat and ice to the neck and head to help with muscle tension and pain. We will trial as needed cyclobenzaprine at night to see if this helps relieve her headaches and break the cycle. We also  discussed increasing water intake.  PLAN: - cyclobenzaprine (FLEXERIL) 10 MG tablet; Take 1 tablet (10 mg total) by mouth at bedtime.  Dispense: 30 tablet; Refill: 1 As needed for headache - Gentle stretching exercises to the neck and shoulders - Relaxation techniques may be very beneficial - Utilize heat and ice to the neck, shoulders, and head  - Maintain good hydration - Follow-up if symptoms worsen or fail to improve  4. Urticaria Chronic itching/urticaria well controlled with daily cetirizine in the morning and xyzal at night. Refill provided today.  - cetirizine (ZYRTEC) 10 MG tablet; Take 1 tablet (10 mg total) by mouth daily.  Dispense: 30 tablet; Refill: 11  5. Uncontrolled type 2 diabetes mellitus with hyperglycemia (HCC) Better control of blood sugar since new diagnosis about 2 weeks ago. She is doing great with frequent monitoring of her blood sugar and taking her medication as prescribed. She is still quite upset over the diagnosis and is hopeful to achieve glucose control by the next visit in August with her PCP. She is hopeful she will have her A1c low enough to  be able to have surgery before the end of the year. She is having some increased vaginal irritation and candidal infections of the skin- most likely related to her elevated blood sugars and the medication. We will treat for this today long term given her history of requiring long term treatment for candidal infections. Refill on lancets and test strips today for the brand specific to her machine.   PLAN: - Dapagliflozin-metFORMIN HCl ER 5-500 MG TB24; Take 1 tablet by mouth in the morning.  Dispense: 30 tablet; Refill: 2  Refill provided - Lancets Misc. KIT; Contour Next lancets, patient preference. For testing blood sugars up to twice daily. Dx:E11.65  Dispense: 1 kit; Refill: 0 - Glucose Blood (BLOOD GLUCOSE TEST STRIPS) STRP; Contour Next test strips, patient preference. For testing blood sugars twice daily. Dx: E11.65  Dispense: 200 strip; Refill: 6 - Treatment for candidal infections - Continue to monitor blood glucose levels and take medications daily - Continue to work on diet and exercise.  - Note provided to return to work on Tuesday 07/28/19 -Follow-up appointment in August with Dr. Madilyn Fireman. Call sooner if needed.    Orma Render, NP

## 2019-07-25 ENCOUNTER — Other Ambulatory Visit: Payer: Self-pay | Admitting: Family Medicine

## 2019-07-28 ENCOUNTER — Ambulatory Visit: Payer: 59

## 2019-08-03 ENCOUNTER — Ambulatory Visit (INDEPENDENT_AMBULATORY_CARE_PROVIDER_SITE_OTHER): Payer: 59 | Admitting: Sports Medicine

## 2019-08-03 ENCOUNTER — Other Ambulatory Visit: Payer: Self-pay

## 2019-08-03 DIAGNOSIS — M1712 Unilateral primary osteoarthritis, left knee: Secondary | ICD-10-CM | POA: Diagnosis not present

## 2019-08-03 NOTE — Assessment & Plan Note (Signed)
This is a very pleasant 44 year old female, she has a history of gastric band so we are unable to use NSAIDs. Tylenol has not been effective, switching to tramadol.   She has custom orthotics from Dr. Raeford Razor. 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 Today we also injected her knee. Return to see me in a month.

## 2019-08-03 NOTE — Progress Notes (Signed)
    Procedures performed today:    Procedure: Real-time Ultrasound Guided injection of the left knee Device: Samsung [id number]  Verbal informed consent obtained.  Time-out conducted.  Noted no overlying erythema, induration, or other signs of local infection.  Skin prepped in a sterile fashion.  Local anesthesia: Topical Ethyl chloride.  With sterile technique and under real time ultrasound guidance: 1 cc Kenalog 40, 2 cc lidocaine, 2 cc bupivacaine injected easily Completed without difficulty  Pain immediately resolved suggesting accurate placement of the medication.  Advised to call if fevers/chills, erythema, induration, drainage, or persistent bleeding.  Images permanently stored and available for review in the ultrasound unit.  Impression: Technically successful ultrasound guided injection.  Independent interpretation of notes and tests performed by another provider:   None.  Brief History, Exam, Impression, and Recommendations:    Primary osteoarthritis of left knee This is a very pleasant 44 year old female, she has a history of gastric band so we are unable to use NSAIDs. Tylenol has not been effective, switching to tramadol.   She has custom orthotics from Dr. Raeford Razor. 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 Today we also injected her knee. Return to see me in a month.    ___________________________________________ Gwen Her. Dianah Field, M.D., ABFM., CAQSM. Primary Care and [place] Instructor of [place] [employer] [facility] [employer]

## 2019-08-04 MED ORDER — TRAMADOL HCL 50 MG PO TABS: 50.0000 mg | ORAL_TABLET | Freq: Three times a day (TID) | ORAL | 0 refills | Status: DC | PRN

## 2019-08-05 ENCOUNTER — Telehealth: Payer: Self-pay | Admitting: Family Medicine

## 2019-08-05 NOTE — Telephone Encounter (Signed)
Received fax for PA on Tramadol sent trhrough cover my meds waiting on determination. - CF

## 2019-08-05 NOTE — Telephone Encounter (Signed)
Received fax from [place] they approved coverage on Tramadol. Valid 08/05/2019 - 02/01/2020. -CF

## 2019-08-25 ENCOUNTER — Other Ambulatory Visit: Payer: Self-pay

## 2019-08-25 ENCOUNTER — Ambulatory Visit (INDEPENDENT_AMBULATORY_CARE_PROVIDER_SITE_OTHER): Payer: 59 | Admitting: Family Medicine

## 2019-08-25 ENCOUNTER — Encounter: Payer: Self-pay | Admitting: Family Medicine

## 2019-08-25 VITALS — BP 127/81 | HR 91 | Ht 64.0 in | Wt 265.0 lb

## 2019-08-25 DIAGNOSIS — B373 Candidiasis of vulva and vagina: Secondary | ICD-10-CM | POA: Diagnosis not present

## 2019-08-25 DIAGNOSIS — E1165 Type 2 diabetes mellitus with hyperglycemia: Secondary | ICD-10-CM

## 2019-08-25 DIAGNOSIS — B3731 Acute candidiasis of vulva and vagina: Secondary | ICD-10-CM

## 2019-08-25 LAB — GLUCOSE, POCT (MANUAL RESULT ENTRY): POC Glucose: 154 mg/dl — AB (ref 70–99)

## 2019-08-25 MED ORDER — OZEMPIC (0.25 OR 0.5 MG/DOSE) 2 MG/1.5ML ~~LOC~~ SOPN: 0.5000 mg | PEN_INJECTOR | SUBCUTANEOUS | 1 refills | Status: DC

## 2019-08-25 MED ORDER — FLUCONAZOLE 200 MG PO TABS: ORAL_TABLET | ORAL | 0 refills | Status: DC

## 2019-08-25 NOTE — Progress Notes (Signed)
Established Patient Office Visit  Subjective:  Patient ID: Jamie Mccarthy, female    DOB: May 26, 1975  Age: 44 y.o. MRN: [id number]  CC:  Chief Complaint  Patient presents with  . Diabetes    HPI Jamie Mccarthy presents for diabetic followup.  She is doing well on her current regimen she is been checking her blood sugars each morning.  Her 7-day average is 128, her 14-day average is 189.  She just went up to the 0.5 mg on the Ozempic about a week ago she will give her second 0.5 mg shot today.  Still has a spot on her left lateral knee that she would like me to look at today.  She wonders if it was from the ethyl chloride spray from when she had a knee injection she says it is peeling now.  She says her knee unfortunately is starting to hurt again.  Report she is been getting a few more headaches recently.  Does like a lot of pressure in her head.  Also feels like she is starting to get yeast vaginitis again.  She was previously treated with a more extended course of Diflucan and says that worked really well but now she is starting to notice discharge and odor again.  She would like a refill if possible.  Past Medical History:  Diagnosis Date  . Anemia    Has sickle cell trait  . GERD (gastroesophageal reflux disease)    diet controlled  . Gestational diabetes   . H/O vaginal delivery 2010  . Headache(784.0)    Migraines  . Herpes 2007   hx of  . Hypertension    no current medications  . Iron deficiency anemia, unspecified 10/20/2012  . Obese   . PONV (postoperative nausea and vomiting)    has had PONV with previous c/s and also lap band surgery 2004  . Seasonal allergies     Past Surgical History:  Procedure Laterality Date  . BILATERAL SALPINGECTOMY  07/16/2012   Procedure: BILATERAL DISTAL SALPINGECTOMY;  Surgeon: Logan Bores, MD;  Location: [facility];  Service: Obstetrics;;  . CESAREAN SECTION  2012  . CESAREAN SECTION N/A 07/16/2012   Procedure: REPEAT CESAREAN  SECTION;  Surgeon: Logan Bores, MD;  Location: [facility];  Service: Obstetrics;  Laterality: N/A;  . CHOLECYSTECTOMY N/A 11/14/2012   Procedure: LAPAROSCOPIC CHOLECYSTECTOMY;  Surgeon: Gayland Curry, MD;  Location: [facility];  Service: General;  Laterality: N/A;  . GASTRIC BANDING PORT REVISION N/A 11/14/2012   Procedure: GASTRIC BANDING PORT REVISION;  Surgeon: Gayland Curry, MD;  Location: [facility];  Service: General;  Laterality: N/A;  PORT PLACEMENT MOVED NEW MESH INSERTED   . LAPAROSCOPIC GASTRIC BANDING  2005  . LAPAROSCOPIC VAGINAL HYSTERECTOMY WITH SALPINGECTOMY Bilateral 10/12/2015   Procedure: HYSTERECTOMY TOTAL LAPAROSCOPIC BILATERAL SALPINGECTOMY;  Surgeon: Sherlyn Hay, DO;  Location: [facility];  Service: Gynecology;  Laterality: Bilateral;  . reposition gastric band  2006   x2  . TUBAL LIGATION  2014  . WISDOM TOOTH EXTRACTION  1999    Family History  Problem Relation Age of Onset  . Hypertension Mother   . Diabetes Other        father's family  . Thyroid cancer Other   . Breast cancer Maternal Grandmother     Social History   Socioeconomic History  . Marital status: Married    Spouse name: Not on file  . Number of children: Not on file  . Years  of education: Not on file  . Highest education level: Not on file  Occupational History  . Occupation: Theatre manager: [employer]  Tobacco Use  . Smoking status: Never Smoker  . Smokeless tobacco: Never Used  Vaping Use  . Vaping Use: Never used  Substance and Sexual Activity  . Alcohol use: Yes    Comment: occ  . Drug use: No  . Sexual activity: Yes    Comment: separated, 2 caffeine drinks daily.   Other Topics Concern  . Not on file  Social History Narrative   2 caffeine drinks per day   Social Determinants of Health   Financial Resource Strain:   . Difficulty of Paying Living Expenses:   Food Insecurity:   . Worried About Charity fundraiser in the Last Year:   . Arboriculturist in the  Last Year:   Transportation Needs:   . Film/video editor (Medical):   Marland Kitchen Lack of Transportation (Non-Medical):   Physical Activity:   . Days of Exercise per Week:   . Minutes of Exercise per Session:   Stress:   . Feeling of Stress :   Social Connections:   . Frequency of Communication with Friends and Family:   . Frequency of Social Gatherings with Friends and Family:   . Attends Religious Services:   . Active Member of Clubs or Organizations:   . Attends Archivist Meetings:   Marland Kitchen Marital Status:   Intimate Partner Violence:   . Fear of Current or Ex-Partner:   . Emotionally Abused:   Marland Kitchen Physically Abused:   . Sexually Abused:     Outpatient Medications Prior to Visit  Medication Sig Dispense Refill  . Accu-Chek FastClix Lancets MISC Apply topically.    Marland Kitchen acetaminophen (TYLENOL) 650 MG CR tablet Take 1 tablet (650 mg total) by mouth every 8 (eight) hours as needed for pain. 90 tablet 3  . blood glucose meter kit and supplies KIT Dispense based on patient and insurance preference. Use up to twice times daily as directed. (FOR ICD-9 250.00, 250.01). 1 each PRN  . cetirizine (ZYRTEC) 10 MG tablet Take 1 tablet (10 mg total) by mouth daily. 30 tablet 11  . cyclobenzaprine (FLEXERIL) 10 MG tablet Take 1 tablet (10 mg total) by mouth at bedtime. 30 tablet 1  . Dapagliflozin-metFORMIN HCl ER 5-500 MG TB24 Take 1 tablet by mouth in the morning. 30 tablet 2  . Fluocinolone Acetonide 0.01 % OIL Place 3 drops in ear(s) at bedtime. 20 mL 0  . Glucose Blood (BLOOD GLUCOSE TEST STRIPS) STRP Contour Next test strips, patient preference. For testing blood sugars twice daily. Dx: E11.65 200 strip 6  . Insulin Pen Needle (BD PEN NEEDLE NANO U/F) 32G X 4 MM MISC USE TO INJECT INTO SKIN DAILY AS DIRECTED 100 each 2  . ketoconazole (NIZORAL) 2 % shampoo Apply to scalp twice a week for 8 weeks and then weekly thereafter (Patient taking differently: Apply 1 application topically once a week.  ) 120 mL 11  . Lancets Misc. KIT Contour Next lancets, patient preference. For testing blood sugars up to twice daily. Dx:E11.65 1 kit 0  . levocetirizine (XYZAL) 5 MG tablet TAKE 1 TABLET BY MOUTH EVERY DAY IN THE EVENING (Patient taking differently: Take 5 mg by mouth every evening. ) 90 tablet 1  . nystatin (MYCOSTATIN/NYSTOP) powder SMARTSIG:Liberally Topical Twice Daily    . Nystatin POWD Apply liberally to affected skin  twice a day. 1 each 2  . Probiotic Product (PROBIOTIC PO) Take 1 tablet by mouth daily.    . SUMAtriptan (IMITREX) 100 MG tablet TAKE 1 TABLET BY MOUTH EVERY 2HRS AS NEEDED FOR MIGRAINE. (MAY REPEAT IN 2HRS IF HEADACHE PERSIST) (Patient taking differently: Take 100 mg by mouth every 2 (two) hours as needed for migraine. TAKE 1 TABLET BY MOUTH EVERY 2HRS AS NEEDED FOR MIGRAINE. (MAY REPEAT IN 2HRS IF HEADACHE PERSIST)) 10 tablet 2  . traMADol (ULTRAM) 50 MG tablet Take 1-2 tablets (50-100 mg total) by mouth every 8 (eight) hours as needed for moderate pain. Maximum 6 tabs per day. 21 tablet 0  . tretinoin microspheres (RETIN-A MICRO) 0.04 % gel APPLY TO AFFECTED AREA EVERY DAY AT BEDTIME (Patient taking differently: Apply 1 application topically at bedtime. ) 45 g 0  . fluconazole (DIFLUCAN) 200 MG tablet Take 1 tablet (236m) by mouth every 3 days for 3 doses ( 6 day span) then take 1 tablet by mouth once a week. 6 tablet 3  . Semaglutide,0.25 or 0.5MG/DOS, (OZEMPIC, 0.25 OR 0.5 MG/DOSE,) 2 MG/1.5ML SOPN Inject 0.1875 mLs (0.25 mg total) into the skin every 7 (seven) days. Increase to 0.514mafter 4 weeks. 2 pen 1   No facility-administered medications prior to visit.    Allergies  Allergen Reactions  . Latex Itching    Pt states she gets severely itchy with latex  . Naproxen Rash    ROS Review of Systems    Objective:    Physical Exam Constitutional:      Appearance: She is well-developed.  HENT:     Head: Normocephalic and atraumatic.  Cardiovascular:     Rate  and Rhythm: Normal rate and regular rhythm.     Heart sounds: Normal heart sounds.  Pulmonary:     Effort: Pulmonary effort is normal.     Breath sounds: Normal breath sounds.  Skin:    General: Skin is warm and dry.     Comments: The left lateral leg she has a circular pigmented scaling lesion.  No induration or drainage.  Neurological:     Mental Status: She is alert and oriented to person, place, and time.  Psychiatric:        Behavior: Behavior normal.     BP 127/81   Pulse 91   Ht _0  (1.626 m)   Wt 265 lb (120.2 kg)   LMP 08/29/2015 (Approximate)   SpO2 100%   BMI 45.49 kg/m  Wt Readings from Last 3 Encounters:  08/25/19 265 lb (120.2 kg)  07/24/19 264 lb 14.4 oz (120.2 kg)  07/14/19 267 lb (121.1 kg)     Health Maintenance Due  Topic Date Due  . FOOT EXAM  Never done  . OPHTHALMOLOGY EXAM  Never done  . URINE MICROALBUMIN  Never done  . INFLUENZA VACCINE  08/23/2019    There are no preventive care reminders to display for this patient.  Lab Results  Component Value Date   TSH 0.78 07/09/2018   Lab Results  Component Value Date   WBC 7.9 07/09/2019   HGB 13.0 07/09/2019   HCT 40.4 07/09/2019   MCV 79.2 (L) 07/09/2019   PLT 325 07/09/2019   Lab Results  Component Value Date   NA 137 07/09/2019   K 4.4 07/09/2019   CO2 25 07/09/2019   GLUCOSE 286 (H) 07/09/2019   BUN 18 07/09/2019   CREATININE 0.67 07/09/2019   BILITOT 0.8 07/09/2019   ALKPHOS  69 07/09/2019   AST 19 07/09/2019   ALT 20 07/09/2019   PROT 6.9 07/09/2019   ALBUMIN 3.7 07/09/2019   CALCIUM 8.9 07/09/2019   ANIONGAP 9 07/09/2019   Lab Results  Component Value Date   CHOL 171 07/09/2018   Lab Results  Component Value Date   HDL 57 07/09/2018   Lab Results  Component Value Date   LDLCALC 99 07/09/2018   Lab Results  Component Value Date   TRIG 60 07/09/2018   Lab Results  Component Value Date   CHOLHDL 3.0 07/09/2018   Lab Results  Component Value Date   HGBA1C  10.6 (A) 07/14/2019      Assessment & Plan:   Problem List Items Addressed This Visit      Endocrine   Uncontrolled diabetes mellitus ([place]) - Primary    She is actually doing really well overall.  Her A1c is down significantly today at 8.3.  Goal is to get her under 8 so that she can actually have her bariatric surgery.  We discussed continuing current regimen she had actually just increased her Ozempic to 0.5.  Continue with that regimen and plan to follow-up in 4 weeks we will repeat her A1c at that time she should be under 8 and we should be able to get her referred for her surgery.  She says she is also using her Saxenda at the same time is that [place] so encouraged her to stop the Saxenda explained that these drugs actually do the same thing.      Relevant Medications   Semaglutide,0.25 or 0.5MG/DOS, (OZEMPIC, 0.25 OR 0.5 MG/DOSE,) 2 MG/1.5ML SOPN   Other Relevant Orders   POCT glucose (manual entry) (Completed)    Other Visit Diagnoses    Vaginal candidiasis       Relevant Medications   nystatin (MYCOSTATIN/NYSTOP) powder   fluconazole (DIFLUCAN) 200 MG tablet      Headaches-suspect may be related to some elevated blood pressures.  Her pressure was actually quite high today but upon recheck it did come down.  I definitely want her to keep an eye on this she has a strong family history of hypertension as were make sure her blood pressure looks good before surgery.  He does have a home cuff and says she would be able to check it there.  Yeast vaginitis-did discuss that it is directly related to uncontrolled blood sugars so this should get better over time.  Medical ahead and refill the Diflucan today.  If her symptoms recur then she will need a wet prep.  Meds ordered this encounter  Medications  . Semaglutide,0.25 or 0.5MG/DOS, (OZEMPIC, 0.25 OR 0.5 MG/DOSE,) 2 MG/1.5ML SOPN    Sig: Inject 0.375 mLs (0.5 mg total) into the skin every 7 (seven) days.    Dispense:  4 pen     Refill:  1  . fluconazole (DIFLUCAN) 200 MG tablet    Sig: Take 1 tablet (226m) by mouth every 3 days for 3 doses ( 9 day span) then take 1 tablet by mouth once a week.    Dispense:  6 tablet    Refill:  0    Follow-up: Return in about 4 weeks (around 09/22/2019) for Diabetes follow-up/recheck A1C for surgery .    CBeatrice Lecher MD

## 2019-08-25 NOTE — Progress Notes (Signed)
7=128 14=186 30=96  A1c today was 8.3%  BS on home monitor was 147

## 2019-08-25 NOTE — Assessment & Plan Note (Signed)
She is actually doing really well overall.  Her A1c is down significantly today at 8.3.  Goal is to get her under 8 so that she can actually have her bariatric surgery.  We discussed continuing current regimen she had actually just increased her Ozempic to 0.5.  Continue with that regimen and plan to follow-up in 4 weeks we will repeat her A1c at that time she should be under 8 and we should be able to get her referred for her surgery.  She says she is also using her Saxenda at the same time is that [place] so encouraged her to stop the Saxenda explained that these drugs actually do the same thing.

## 2019-08-31 ENCOUNTER — Ambulatory Visit (INDEPENDENT_AMBULATORY_CARE_PROVIDER_SITE_OTHER): Payer: 59 | Admitting: Sports Medicine

## 2019-08-31 ENCOUNTER — Encounter: Payer: Self-pay | Admitting: Sports Medicine

## 2019-08-31 ENCOUNTER — Ambulatory Visit (INDEPENDENT_AMBULATORY_CARE_PROVIDER_SITE_OTHER): Payer: 59

## 2019-08-31 ENCOUNTER — Other Ambulatory Visit: Payer: Self-pay

## 2019-08-31 DIAGNOSIS — N926 Irregular menstruation, unspecified: Secondary | ICD-10-CM

## 2019-08-31 DIAGNOSIS — R9082 White matter disease, unspecified: Secondary | ICD-10-CM | POA: Diagnosis not present

## 2019-08-31 DIAGNOSIS — G8929 Other chronic pain: Secondary | ICD-10-CM | POA: Diagnosis not present

## 2019-08-31 DIAGNOSIS — R519 Headache, unspecified: Secondary | ICD-10-CM

## 2019-08-31 DIAGNOSIS — J322 Chronic ethmoidal sinusitis: Secondary | ICD-10-CM | POA: Diagnosis not present

## 2019-08-31 DIAGNOSIS — M1712 Unilateral primary osteoarthritis, left knee: Secondary | ICD-10-CM | POA: Diagnosis not present

## 2019-08-31 DIAGNOSIS — J3489 Other specified disorders of nose and nasal sinuses: Secondary | ICD-10-CM | POA: Diagnosis not present

## 2019-08-31 DIAGNOSIS — J0120 Acute ethmoidal sinusitis, unspecified: Secondary | ICD-10-CM | POA: Diagnosis not present

## 2019-08-31 DIAGNOSIS — M25562 Pain in left knee: Secondary | ICD-10-CM | POA: Diagnosis not present

## 2019-08-31 IMAGING — MR MR KNEE*L* W/O CM
7 series · 40 of 40 positions shown · non-contrast
Comparison: None.

CLINICAL DATA: Chronic left knee pain. Anterior knee pain near the
patella.

EXAM:
MRI OF THE LEFT KNEE WITHOUT CONTRAST
TECHNIQUE: Multiplanar, multisequence MR imaging of the knee was performed. No
intravenous contrast was administered.

[Series 3: T2 fat-sat · axial · 4.0mm · 0.53mm/px · z∈[-113,+57]mm · 6 of 35 slices shown (1 of 3)]
[im 1/35]
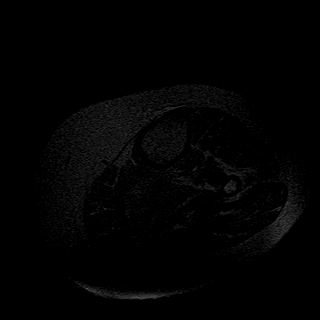
[im 7/35]
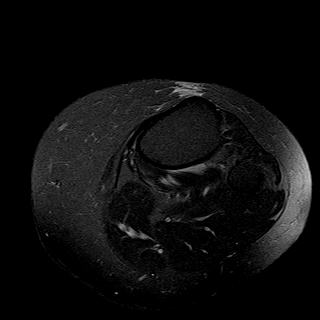
[im 14/35]
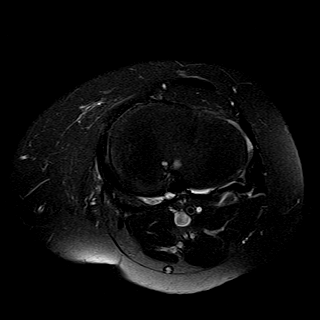
[im 21/35]
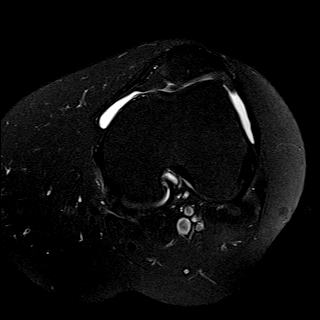
[im 28/35]
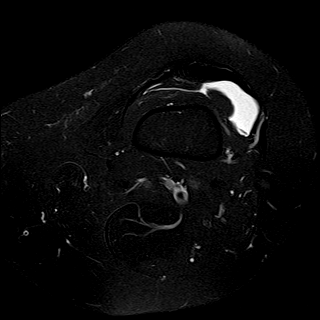
[im 35/35]
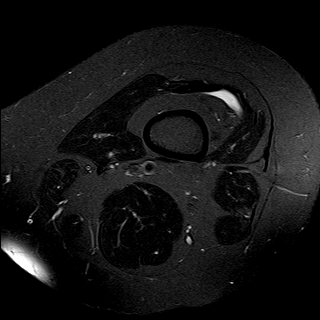

[Series 4: T1 · coronal · 4.0mm · 0.62mm/px · 6 of 31 slices shown]
[im 1/31]
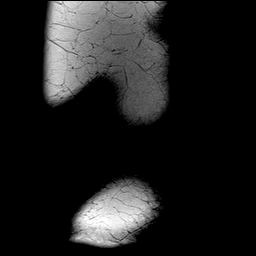
[im 7/31]
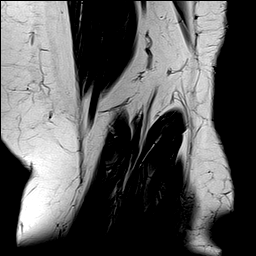
[im 13/31]
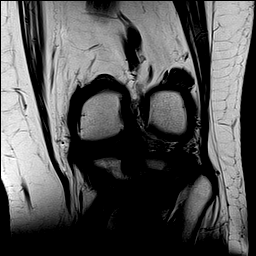
[im 19/31]
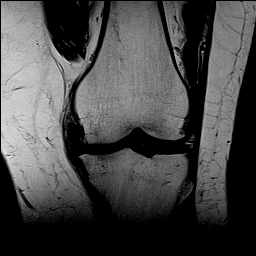
[im 25/31]
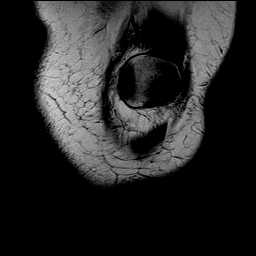
[im 31/31]
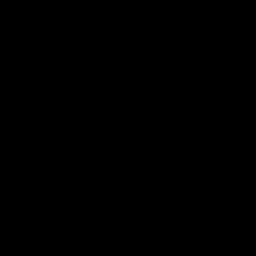

[Series 5: T2 fat-sat · coronal · 4.0mm · 0.62mm/px · 6 of 31 slices shown (2 of 3)]
[im 1/31]
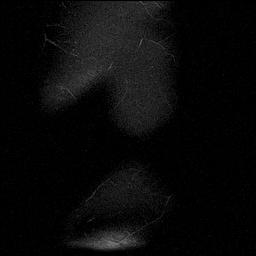
[im 7/31]
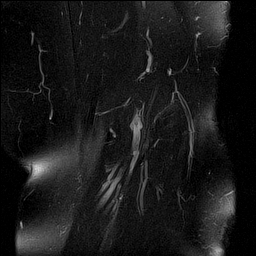
[im 13/31]
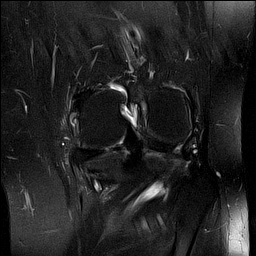
[im 19/31]
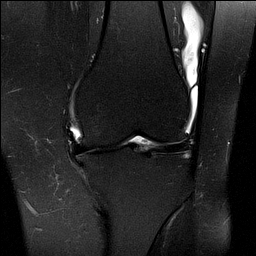
[im 25/31]
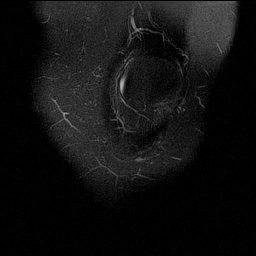
[im 31/31]
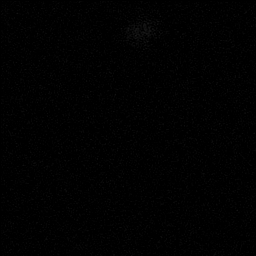

[Series 6: PD fat-sat · coronal · 4.0mm · 0.62mm/px · 6 of 31 slices shown (1 of 3)]
[im 1/31]
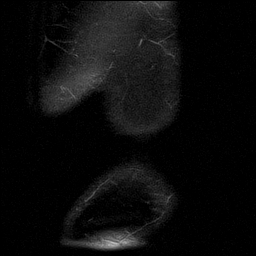
[im 7/31]
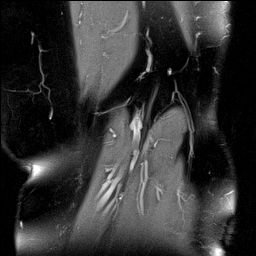
[im 13/31]
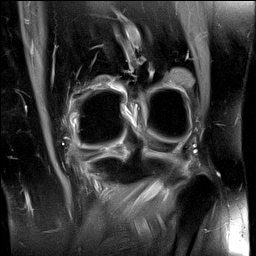
[im 19/31]
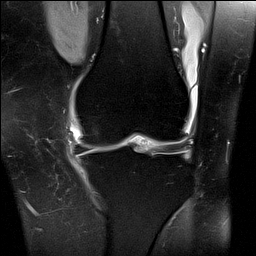
[im 25/31]
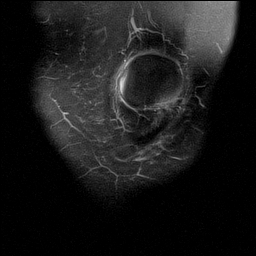
[im 31/31]
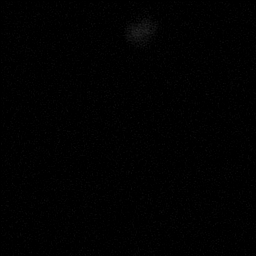

[Series 7: PD fat-sat · sagittal · 3.0mm · 0.62mm/px · 6 of 35 slices shown (2 of 3)]
[im 1/35]
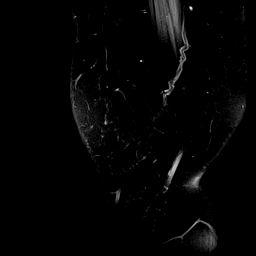
[im 7/35]
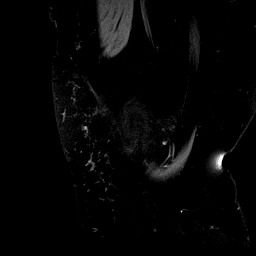
[im 14/35]
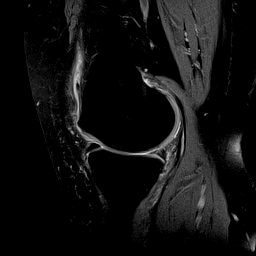
[im 21/35]
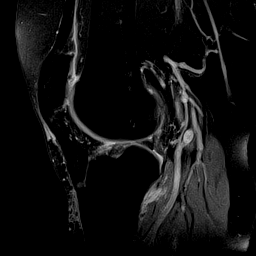
[im 28/35]
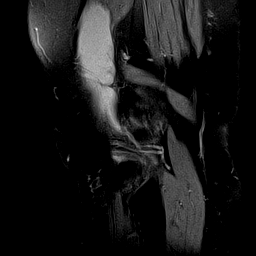
[im 35/35]
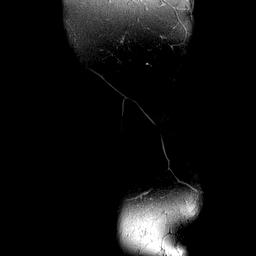

[Series 8: T2 fat-sat · sagittal · 3.0mm · 0.62mm/px · 6 of 35 slices shown (3 of 3)]
[im 1/35]
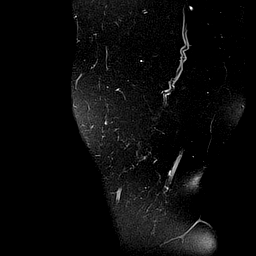
[im 7/35]
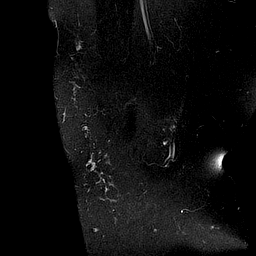
[im 14/35]
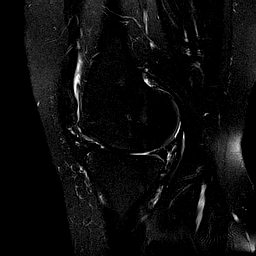
[im 21/35]
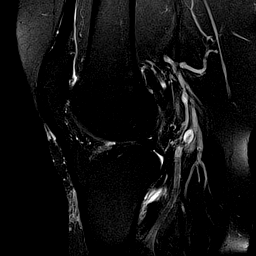
[im 28/35]
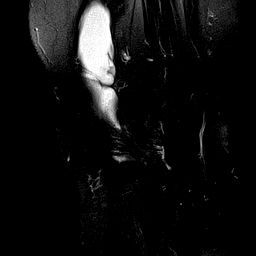
[im 35/35]
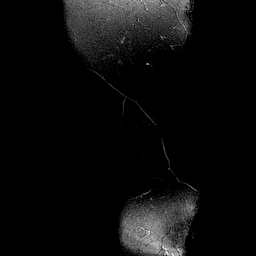

[Series 9: PD fat-sat · oblique · 2.0mm · 0.62mm/px · 4 of 19 slices shown (3 of 3)]
[im 1/19]
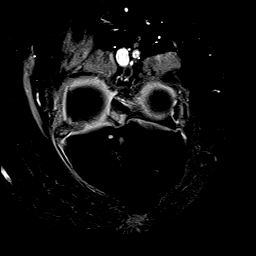
[im 7/19]
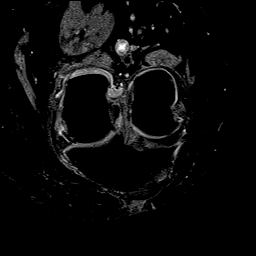
[im 13/19]
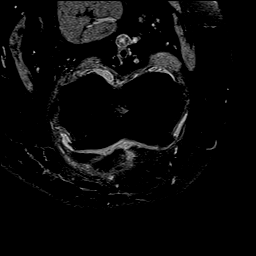
[im 19/19]
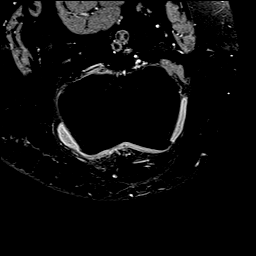

[40 of 40 positions shown; findings below may reference images not displayed]

FINDINGS: MENISCI

Medial: Complex tear of the posterior horn of medial meniscus
towards the meniscal root with peripheral meniscal extrusion.
Degeneration of the body and posterior horn of the medial meniscus.

Lateral: Intact.

LIGAMENTS

Cruciates: Intact ACL which is increased in signal and expanded as
can be seen with mucinous degeneration. Intact PCL.

Collaterals: Medial collateral ligament is intact. Lateral
collateral ligament complex is intact.

CARTILAGE

Patellofemoral: Full-thickness cartilage fissuring of the patellar
apex.

Medial: High-grade partial-thickness cartilage loss of the medial
femorotibial compartment with marginal osteophytes.

Lateral:  No chondral defect.

JOINT: Small joint effusion. Normal NL. No plical
thickening.

POPLITEAL FOSSA: Popliteus tendon is intact. No Baker's cyst.

EXTENSOR MECHANISM: Intact quadriceps tendon. Intact patellar
tendon. Intact lateral patellar retinaculum. Intact medial patellar
retinaculum. Intact MPFL.

BONES: No fracture or dislocation. No aggressive osseous lesion.
Subcortical reactive marrow changes at the root of the posterior
horn of the medial meniscus.

Other: No fluid collection or hematoma. Muscles are normal.
IMPRESSION: 1. Complex tear of the posterior horn of medial meniscus towards the
meniscal root with peripheral meniscal extrusion. Degeneration of
the body and posterior horn of the medial meniscus.
2. High-grade partial-thickness cartilage loss of the medial
femorotibial compartment with marginal osteophytes.
3. Full-thickness cartilage fissuring of the patellar apex.

## 2019-08-31 IMAGING — MR MR HEAD WO/W CM
12 series · 48 of 48 positions shown · IV contrast (10 mL GADAVIST)
Comparison: [DATE] head CT.

CLINICAL DATA: Headache

EXAM:
MRI HEAD WITHOUT AND WITH CONTRAST
TECHNIQUE: Multiplanar, multiecho pulse sequences of the brain and surrounding
structures were obtained without and with intravenous contrast.
CONTRAST:  10mL GADAVIST GADOBUTROL 1 MMOL/ML IV SOLN

[Series 4: DWI · axial · 3.0mm · 1.20mm/px · z∈[-73,+80]mm · 3 of 52 slices shown (1 of 2)]
[im 1/52]
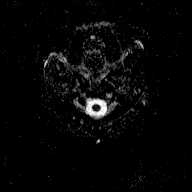
[im 26/52]
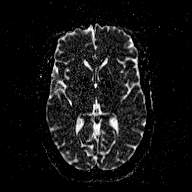
[im 52/52]
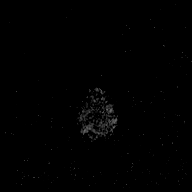

[Series 6: DWI · coronal · 3.0mm · 1.15mm/px · 3 of 45 slices shown (2 of 2)]
[im 1/45]
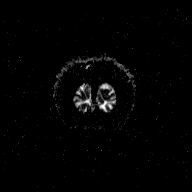
[im 23/45]
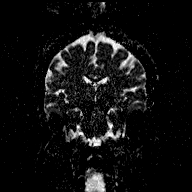
[im 45/45]
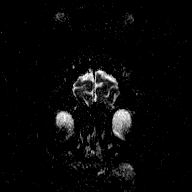

[Series 7: T1 · sagittal · 5.0mm · 0.45mm/px · 1 of 20 slices shown (1 of 2)]
[im 1/20]
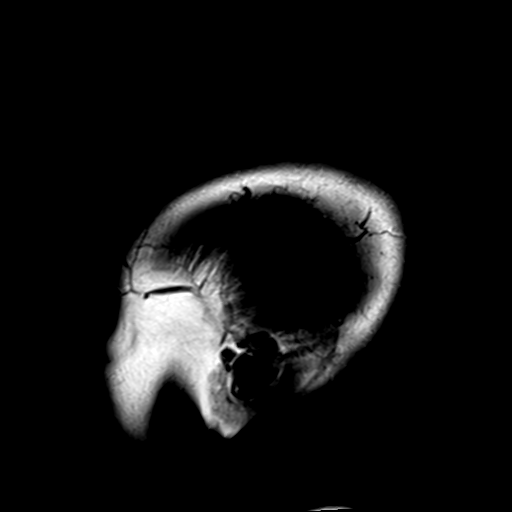

[Series 8: T2 · axial · 5.0mm · 0.72mm/px · z∈[-70,+70]mm · 2 of 21 slices shown (1 of 2)]
[im 1/21]
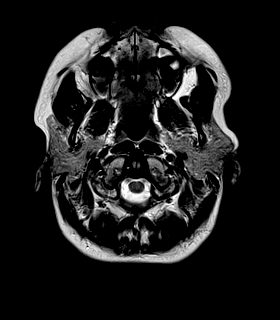
[im 21/21]
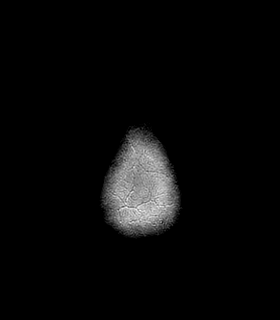

[Series 9: FLAIR · axial · 3.0mm · 0.45mm/px · z∈[-73,+74]mm · 4 of 50 slices shown]
[im 1/50]
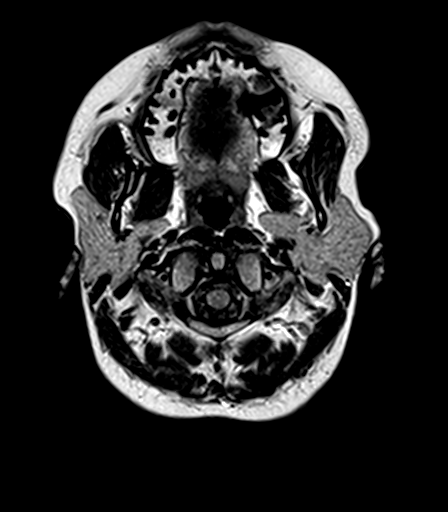
[im 17/50]
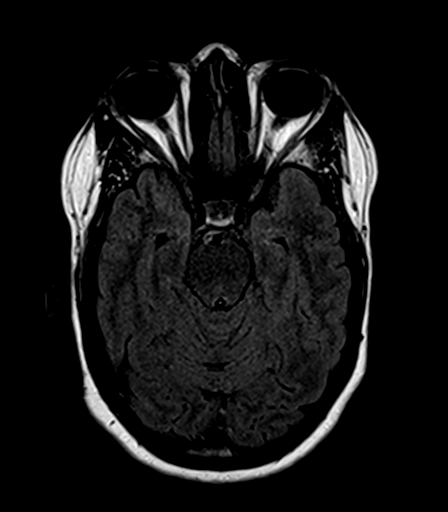
[im 33/50]
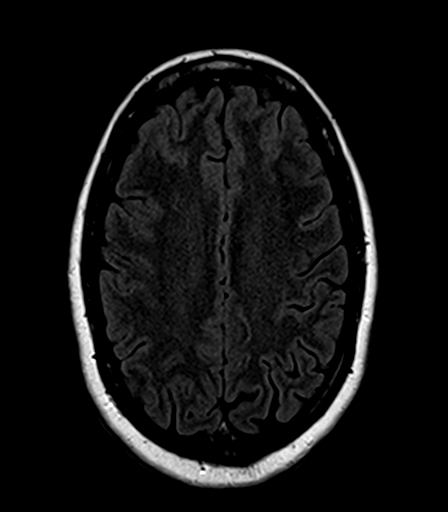
[im 50/50]
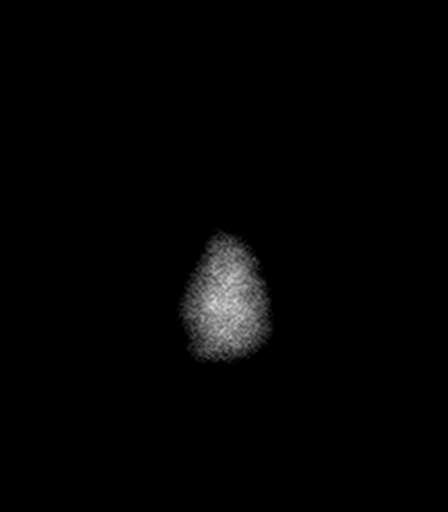

[Series 10: T2 · axial · 5.0mm · 0.72mm/px · z∈[-70,+70]mm · 2 of 21 slices shown (2 of 2)]
[im 1/21]
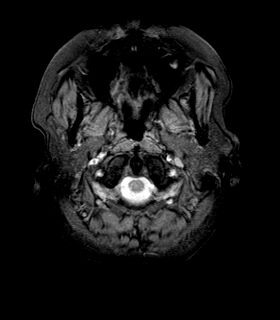
[im 21/21]
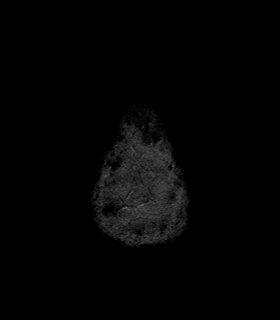

[Series 11: T1 · axial · 1.0mm · 1.00mm/px · z∈[-71,+72]mm · 11 of 144 slices shown (2 of 2)]
[im 1/144]
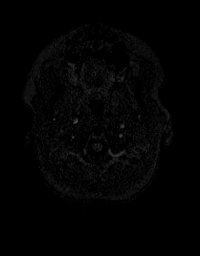
[im 15/144]
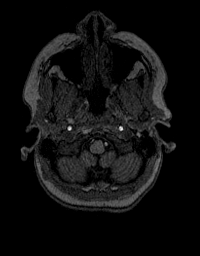
[im 29/144]
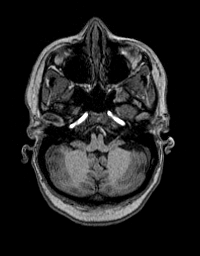
[im 43/144]
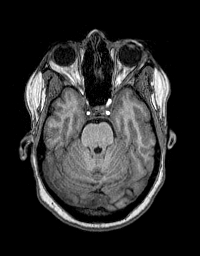
[im 58/144]
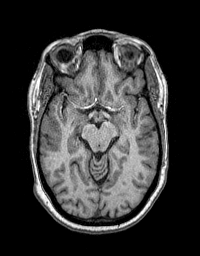
[im 72/144]
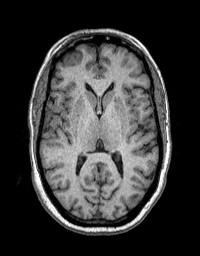
[im 86/144]
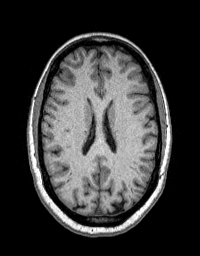
[im 101/144]
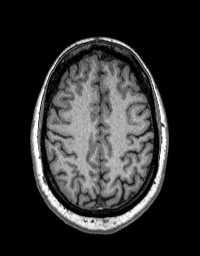
[im 115/144]
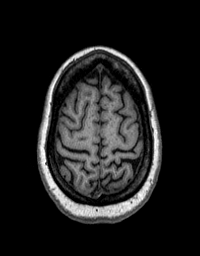
[im 129/144]
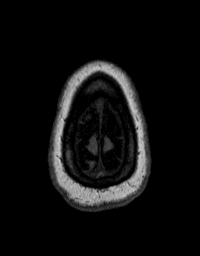
[im 144/144]
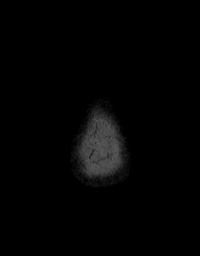

[Series 12: T2 post-contrast · coronal · 5.0mm · 0.43mm/px · 2 of 30 slices shown]
[im 1/30]
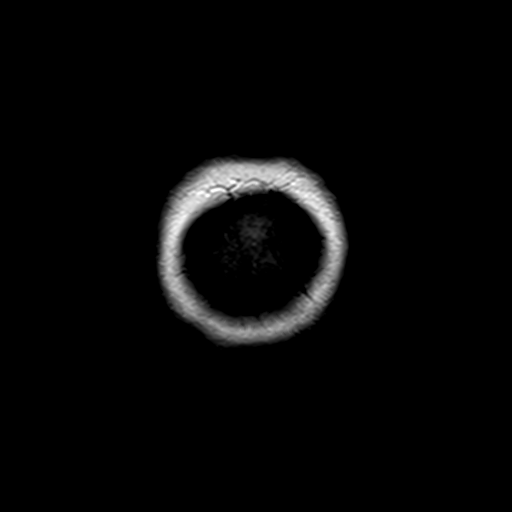
[im 30/30]
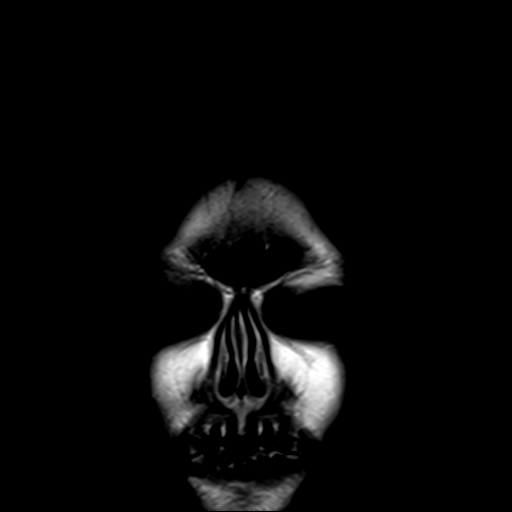

[Series 13: T1 post-contrast · axial · 1.0mm · 1.00mm/px · z∈[-71,+72]mm · 11 of 144 slices shown (1 of 2)]
[im 1/144]
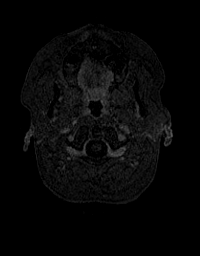
[im 15/144]
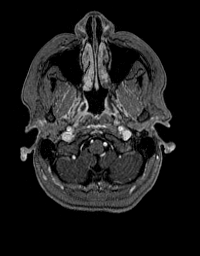
[im 29/144]
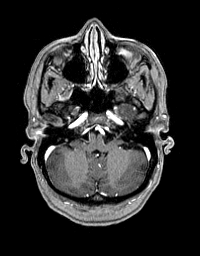
[im 43/144]
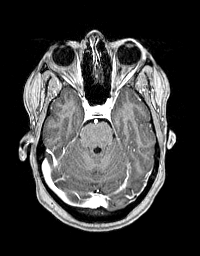
[im 58/144]
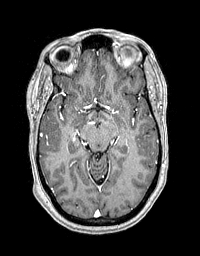
[im 72/144]
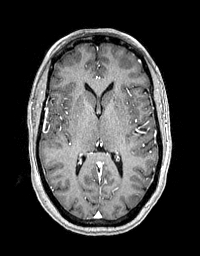
[im 86/144]
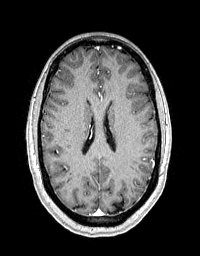
[im 101/144]
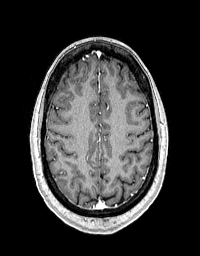
[im 115/144]
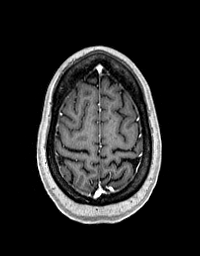
[im 129/144]
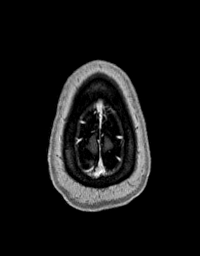
[im 144/144]
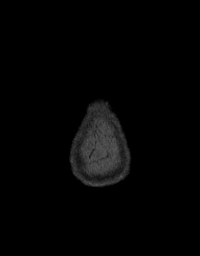

[Series 14: T1 post-contrast · coronal · 5.0mm · 0.43mm/px · 2 of 31 slices shown (2 of 2)]
[im 1/31]
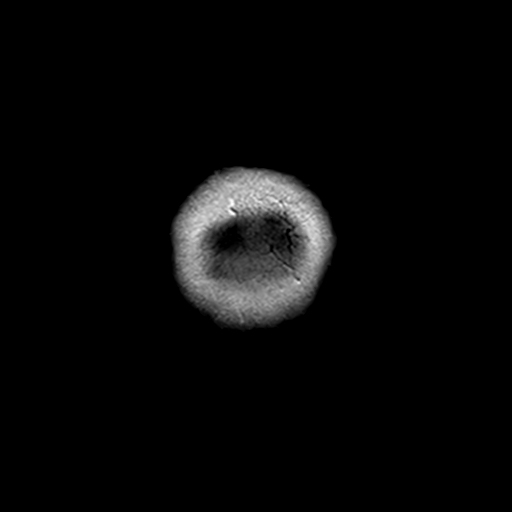
[im 31/31]
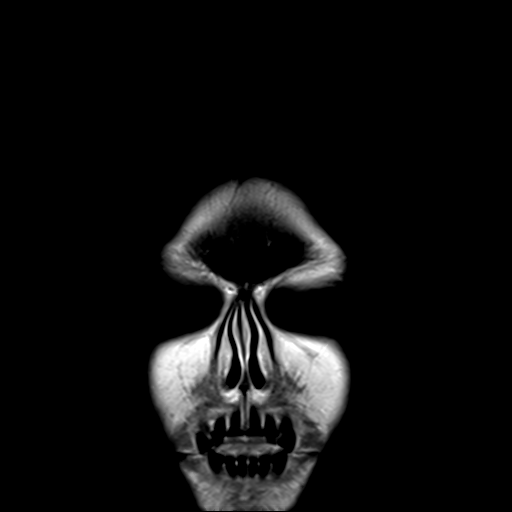

[Series 100: (id) ax · axial · 3.0mm · 1.20mm/px · z∈[-73,+80]mm · 4 of 52 slices shown]
[im 1/52]
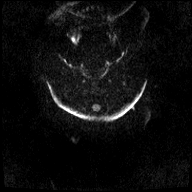
[im 18/52]
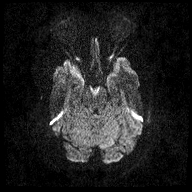
[im 35/52]
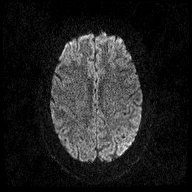
[im 52/52]
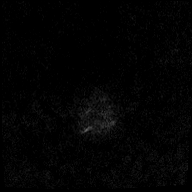

[Series 101: (id) cor · coronal · 3.0mm · 1.15mm/px · 3 of 45 slices shown]
[im 1/45]
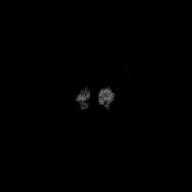
[im 23/45]
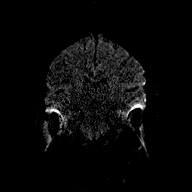
[im 45/45]
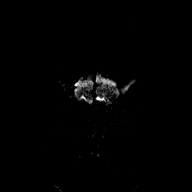

[48 of 48 positions shown; findings below may reference images not displayed]

FINDINGS: Brain: Scattered punctate T2/FLAIR hyperintense foci involving the
bifrontal white matter ([DATE], 35, 34). No acute infarct or
intracranial hemorrhage. No midline shift, ventriculomegaly or
extra-axial fluid collection. No mass lesion. No abnormal
enhancement.

Vascular: Normal flow voids.

Skull and upper cervical spine: No focal enhancing lesion.

Sinuses/Orbits: Normal orbits. Minimal left ethmoid sinus mucosal
thickening. No mastoid effusion.

Other: None.
IMPRESSION: 1. No acute intracranial abnormality.
2. Scattered punctate T2/FLAIR hyperintense foci involving the
bifrontal white matter. Findings are nonspecific and may reflect
sequela of chronic migraines, post infectious/inflammatory sequela
or early chronic microvascular ischemic changes.

## 2019-08-31 MED ORDER — GADOBUTROL 1 MMOL/ML IV SOLN
10.0000 mL | Freq: Once | INTRAVENOUS | Status: AC | PRN
Start: 2019-08-31 — End: 2019-08-31
  Administered 2019-08-31: 10 mL via INTRAVENOUS

## 2019-08-31 MED ORDER — TROKENDI XR 50 MG PO CP24: 1.0000 | ORAL_CAPSULE | Freq: Every day | ORAL | 11 refills | Status: DC

## 2019-08-31 MED ORDER — TRAMADOL HCL 50 MG PO TABS: 50.0000 mg | ORAL_TABLET | Freq: Three times a day (TID) | ORAL | 0 refills | Status: DC | PRN

## 2019-08-31 NOTE — Progress Notes (Signed)
    Procedures performed today:    None.  Independent interpretation of notes and tests performed by another provider:   None.  Brief History, Exam, Impression, and Recommendations:    Primary osteoarthritis of left knee This is a pleasant 44 year old female, she has known knee osteoarthritis, we have been managing it now for several months. At the last visit we did an injection, she had some relief but still has persistent pain. At this point we are going to proceed with an MRI, refilling tramadol, she is not yet ready to proceed with viscosupplementation but understands she will call me first for approval.  Catamenial migraine [place] continues to have migraines, regular topiramate was not effective, I am going to add Trokendi, a discount coupon was given, she can follow-up with her PCP for titration, she is currently having 30 migraine days per month. She has not had brain imaging for the so I will go ahead and pull the trigger for a brain MRI with and without contrast.    ___________________________________________ Gwen Her. Dianah Field, M.D., ABFM., CAQSM. Primary Care and [place] Instructor of [place] of [facility] of Medicine

## 2019-08-31 NOTE — Assessment & Plan Note (Signed)
Sarajane continues to have migraines, regular topiramate was not effective, I am going to add Trokendi, a discount coupon was given, she can follow-up with her PCP for titration, she is currently having 30 migraine days per month. She has not had brain imaging for the so I will go ahead and pull the trigger for a brain MRI with and without contrast.

## 2019-08-31 NOTE — Assessment & Plan Note (Signed)
This is a pleasant 44 year old female, she has known knee osteoarthritis, we have been managing it now for several months. At the last visit we did an injection, she had some relief but still has persistent pain. At this point we are going to proceed with an MRI, refilling tramadol, she is not yet ready to proceed with viscosupplementation but understands she will call me first for approval.

## 2019-09-12 ENCOUNTER — Other Ambulatory Visit: Payer: Self-pay | Admitting: Family Medicine

## 2019-09-22 ENCOUNTER — Other Ambulatory Visit: Payer: Self-pay

## 2019-09-22 ENCOUNTER — Encounter: Payer: Self-pay | Admitting: Family Medicine

## 2019-09-22 ENCOUNTER — Ambulatory Visit (INDEPENDENT_AMBULATORY_CARE_PROVIDER_SITE_OTHER): Payer: 59 | Admitting: Family Medicine

## 2019-09-22 VITALS — BP 141/89 | HR 83 | Ht 64.0 in | Wt 265.0 lb

## 2019-09-22 DIAGNOSIS — G43009 Migraine without aura, not intractable, without status migrainosus: Secondary | ICD-10-CM | POA: Diagnosis not present

## 2019-09-22 DIAGNOSIS — G43709 Chronic migraine without aura, not intractable, without status migrainosus: Secondary | ICD-10-CM | POA: Insufficient documentation

## 2019-09-22 DIAGNOSIS — L219 Seborrheic dermatitis, unspecified: Secondary | ICD-10-CM | POA: Diagnosis not present

## 2019-09-22 DIAGNOSIS — E118 Type 2 diabetes mellitus with unspecified complications: Secondary | ICD-10-CM | POA: Diagnosis not present

## 2019-09-22 DIAGNOSIS — R202 Paresthesia of skin: Secondary | ICD-10-CM | POA: Diagnosis not present

## 2019-09-22 DIAGNOSIS — E1165 Type 2 diabetes mellitus with hyperglycemia: Secondary | ICD-10-CM

## 2019-09-22 LAB — POCT GLYCOSYLATED HEMOGLOBIN (HGB A1C): Hemoglobin A1C: 7.6 % — AB (ref 4.0–5.6)

## 2019-09-22 MED ORDER — KETOCONAZOLE 2 % EX SHAM: MEDICATED_SHAMPOO | CUTANEOUS | 11 refills | Status: DC

## 2019-09-22 NOTE — Patient Instructions (Signed)
Go up to 1mg  on the Ozempic.

## 2019-09-22 NOTE — Progress Notes (Signed)
Established Patient Office Visit  Subjective:  Patient ID: Jamie Mccarthy, female    DOB: 03-Dec-1975  Age: 44 y.o. MRN: [id number]  CC:  Chief Complaint  Patient presents with  . Diabetes    HPI Jamie Mccarthy presents for follow-up diabetes.  Unfortunately recently she was actually scheduled for bariatric surgery where it was discovered that her A1c was 10.6.  This was a significant increase from the last one that we had on file for her.  We have been working diligently to try to get her blood sugars under better control she has been trying to eat more healthy and we started her on Ozempic I believe she is up to the 0.5 mg dose.  The goal is to get her A1c under 8 so that she can be rescheduled for surgery.  Also treated her for vaginal candidiasis at last office visit likely secondary to uncontrolled blood sugars.  She reports that her blood sugars have been running mostly in the 130s to low 140s though she has seen a couple of really good numbers such as 110 and 120.  The highest is in the 160s and actually happened a couple times usually with eating a little bit more carbs.  Is also having some more knee pain recently and did see Dr. Dianah Field our sports medicine doctor.  She had had an injection previously and did well but did not get complete relief she was still having some persistent and recurring pain so they are moving forward with regular work-up including MRI.  F/U Migraines headaches -she actually had a brain MRI on August 9 no worrisome findings but did show some hyperintense foci in the brief bifrontal white matter most consistent with chronic migraines.  She was started on Trokendi and says she is actually been doing really well on it she has been on it for most 3 weeks now she says that she was only had one headache and that was last Saturday and she says it was not nearly as intense as what she had been experiencing she has noticed that she is getting a little bit more  tingling in her hands since being on the Trokendi.  That she was not sure if it was coming from the [facility].  Past Medical History:  Diagnosis Date  . Anemia    Has sickle cell trait  . GERD (gastroesophageal reflux disease)    diet controlled  . Gestational diabetes   . H/O vaginal delivery 2010  . Headache(784.0)    Migraines  . Herpes 2007   hx of  . Hypertension    no current medications  . Iron deficiency anemia, unspecified 10/20/2012  . Obese   . PONV (postoperative nausea and vomiting)    has had PONV with previous c/s and also lap band surgery 2004  . Seasonal allergies     Past Surgical History:  Procedure Laterality Date  . BILATERAL SALPINGECTOMY  07/16/2012   Procedure: BILATERAL DISTAL SALPINGECTOMY;  Surgeon: Logan Bores, MD;  Location: [facility];  Service: Obstetrics;;  . CESAREAN SECTION  2012  . CESAREAN SECTION N/A 07/16/2012   Procedure: REPEAT CESAREAN SECTION;  Surgeon: Logan Bores, MD;  Location: [facility];  Service: Obstetrics;  Laterality: N/A;  . CHOLECYSTECTOMY N/A 11/14/2012   Procedure: LAPAROSCOPIC CHOLECYSTECTOMY;  Surgeon: Gayland Curry, MD;  Location: [facility];  Service: General;  Laterality: N/A;  . GASTRIC BANDING PORT REVISION N/A 11/14/2012   Procedure: GASTRIC BANDING PORT REVISION;  Surgeon: Gayland Curry, MD;  Location: [facility];  Service: General;  Laterality: N/A;  PORT PLACEMENT MOVED NEW MESH INSERTED   . LAPAROSCOPIC GASTRIC BANDING  2005  . LAPAROSCOPIC VAGINAL HYSTERECTOMY WITH SALPINGECTOMY Bilateral 10/12/2015   Procedure: HYSTERECTOMY TOTAL LAPAROSCOPIC BILATERAL SALPINGECTOMY;  Surgeon: Sherlyn Hay, DO;  Location: [facility];  Service: Gynecology;  Laterality: Bilateral;  . reposition gastric band  2006   x2  . TUBAL LIGATION  2014  . WISDOM TOOTH EXTRACTION  1999    Family History  Problem Relation Age of Onset  . Hypertension Mother   . Diabetes Other        father's family  . Thyroid cancer Other   . Breast  cancer Maternal Grandmother     Social History   Socioeconomic History  . Marital status: Married    Spouse name: Not on file  . Number of children: Not on file  . Years of education: Not on file  . Highest education level: Not on file  Occupational History  . Occupation: Theatre manager: [employer]  Tobacco Use  . Smoking status: Never Smoker  . Smokeless tobacco: Never Used  Vaping Use  . Vaping Use: Never used  Substance and Sexual Activity  . Alcohol use: Yes    Comment: occ  . Drug use: No  . Sexual activity: Yes    Comment: separated, 2 caffeine drinks daily.   Other Topics Concern  . Not on file  Social History Narrative   2 caffeine drinks per day   Social Determinants of Health   Financial Resource Strain:   . Difficulty of Paying Living Expenses: Not on file  Food Insecurity:   . Worried About Charity fundraiser in the Last Year: Not on file  . Ran Out of Food in the Last Year: Not on file  Transportation Needs:   . Lack of Transportation (Medical): Not on file  . Lack of Transportation (Non-Medical): Not on file  Physical Activity:   . Days of Exercise per Week: Not on file  . Minutes of Exercise per Session: Not on file  Stress:   . Feeling of Stress : Not on file  Social Connections:   . Frequency of Communication with Friends and Family: Not on file  . Frequency of Social Gatherings with Friends and Family: Not on file  . Attends Religious Services: Not on file  . Active Member of Clubs or Organizations: Not on file  . Attends Archivist Meetings: Not on file  . Marital Status: Not on file  Intimate Partner Violence:   . Fear of Current or Ex-Partner: Not on file  . Emotionally Abused: Not on file  . Physically Abused: Not on file  . Sexually Abused: Not on file    Outpatient Medications Prior to Visit  Medication Sig Dispense Refill  . Accu-Chek FastClix Lancets MISC Apply topically.    . blood glucose meter kit and  supplies KIT Dispense based on patient and insurance preference. Use up to twice times daily as directed. (FOR ICD-9 250.00, 250.01). 1 each PRN  . cetirizine (ZYRTEC) 10 MG tablet Take 1 tablet (10 mg total) by mouth daily. 30 tablet 11  . cyclobenzaprine (FLEXERIL) 10 MG tablet Take 1 tablet (10 mg total) by mouth at bedtime. 30 tablet 1  . Dapagliflozin-metFORMIN HCl ER 5-500 MG TB24 Take 1 tablet by mouth in the morning. 30 tablet 2  . fluconazole (DIFLUCAN) 200 MG  tablet Take 1 tablet (229m) by mouth every 3 days for 3 doses ( 9 day span) then take 1 tablet by mouth once a week. 6 tablet 0  . Fluocinolone Acetonide 0.01 % OIL Place 3 drops in ear(s) at bedtime. 20 mL 0  . Glucose Blood (BLOOD GLUCOSE TEST STRIPS) STRP Contour Next test strips, patient preference. For testing blood sugars twice daily. Dx: E11.65 200 strip 6  . Insulin Pen Needle (BD PEN NEEDLE NANO U/F) 32G X 4 MM MISC USE TO INJECT INTO SKIN DAILY AS DIRECTED 100 each 2  . Lancets Misc. KIT Contour Next lancets, patient preference. For testing blood sugars up to twice daily. Dx:E11.65 1 kit 0  . levocetirizine (XYZAL) 5 MG tablet TAKE 1 TABLET BY MOUTH EVERY DAY IN THE EVENING 90 tablet 0  . nystatin (MYCOSTATIN/NYSTOP) powder SMARTSIG:Liberally Topical Twice Daily    . Nystatin POWD Apply liberally to affected skin twice a day. 1 each 2  . Probiotic Product (PROBIOTIC PO) Take 1 tablet by mouth daily.    . Semaglutide,0.25 or 0.5MG/DOS, (OZEMPIC, 0.25 OR 0.5 MG/DOSE,) 2 MG/1.5ML SOPN Inject 0.375 mLs (0.5 mg total) into the skin every 7 (seven) days. 4 pen 1  . SUMAtriptan (IMITREX) 100 MG tablet TAKE 1 TABLET BY MOUTH EVERY 2HRS AS NEEDED FOR MIGRAINE. (MAY REPEAT IN 2HRS IF HEADACHE PERSIST) (Patient taking differently: Take 100 mg by mouth every 2 (two) hours as needed for migraine. TAKE 1 TABLET BY MOUTH EVERY 2HRS AS NEEDED FOR MIGRAINE. (MAY REPEAT IN 2HRS IF HEADACHE PERSIST)) 10 tablet 2  . Topiramate ER (TROKENDI XR)  50 MG CP24 Take 1 capsule by mouth daily. 30 capsule 11  . traMADol (ULTRAM) 50 MG tablet Take 1-2 tablets (50-100 mg total) by mouth every 8 (eight) hours as needed for moderate pain. Maximum 6 tabs per day. 30 tablet 0  . tretinoin microspheres (RETIN-A MICRO) 0.04 % gel APPLY TO AFFECTED AREA EVERY DAY AT BEDTIME (Patient taking differently: Apply 1 application topically at bedtime. ) 45 g 0  . acetaminophen (TYLENOL) 650 MG CR tablet Take 1 tablet (650 mg total) by mouth every 8 (eight) hours as needed for pain. 90 tablet 3  . ketoconazole (NIZORAL) 2 % shampoo Apply to scalp twice a week for 8 weeks and then weekly thereafter (Patient taking differently: Apply 1 application topically once a week. ) 120 mL 11   No facility-administered medications prior to visit.    Allergies  Allergen Reactions  . Latex Itching    Pt states she gets severely itchy with latex  . Naproxen Rash    ROS Review of Systems    Objective:    Physical Exam Constitutional:      Appearance: She is well-developed.  HENT:     Head: Normocephalic and atraumatic.  Cardiovascular:     Rate and Rhythm: Normal rate and regular rhythm.     Heart sounds: Normal heart sounds.  Pulmonary:     Effort: Pulmonary effort is normal.     Breath sounds: Normal breath sounds.  Skin:    General: Skin is warm and dry.  Neurological:     Mental Status: She is alert and oriented to person, place, and time.  Psychiatric:        Behavior: Behavior normal.     BP (!) 141/89   Pulse 83   Ht _0  (1.626 m)   Wt 265 lb (120.2 kg)   LMP 08/29/2015 (Approximate)   SpO2  100%   BMI 45.49 kg/m  Wt Readings from Last 3 Encounters:  09/22/19 265 lb (120.2 kg)  08/25/19 265 lb (120.2 kg)  07/24/19 264 lb 14.4 oz (120.2 kg)     Health Maintenance Due  Topic Date Due  . FOOT EXAM  Never done  . OPHTHALMOLOGY EXAM  Never done  . URINE MICROALBUMIN  Never done    There are no preventive care reminders to display for  this patient.  Lab Results  Component Value Date   TSH 0.78 07/09/2018   Lab Results  Component Value Date   WBC 7.9 07/09/2019   HGB 13.0 07/09/2019   HCT 40.4 07/09/2019   MCV 79.2 (L) 07/09/2019   PLT 325 07/09/2019   Lab Results  Component Value Date   NA 137 07/09/2019   K 4.4 07/09/2019   CO2 25 07/09/2019   GLUCOSE 286 (H) 07/09/2019   BUN 18 07/09/2019   CREATININE 0.67 07/09/2019   BILITOT 0.8 07/09/2019   ALKPHOS 69 07/09/2019   AST 19 07/09/2019   ALT 20 07/09/2019   PROT 6.9 07/09/2019   ALBUMIN 3.7 07/09/2019   CALCIUM 8.9 07/09/2019   ANIONGAP 9 07/09/2019   Lab Results  Component Value Date   CHOL 171 07/09/2018   Lab Results  Component Value Date   HDL 57 07/09/2018   Lab Results  Component Value Date   LDLCALC 99 07/09/2018   Lab Results  Component Value Date   TRIG 60 07/09/2018   Lab Results  Component Value Date   CHOLHDL 3.0 07/09/2018   Lab Results  Component Value Date   HGBA1C 7.6 (A) 09/22/2019      Assessment & Plan:   Problem List Items Addressed This Visit      Cardiovascular and Mediastinum   Migraine headache without aura    GRAIN headaches-so far doing really well with Trokendi though she has had some tingling and paresthesias in both hands.  Discussed that it is not causing any permanent damage and its up to her whether or not it is bothersome enough to continue or discontinue the medication for now she would like to continue the medication as it has been helpful and kept her at work.  We will continue to monitor over the next couple of months.  Follow-up in 3 months.        Endocrine   Controlled diabetes mellitus type 2 with complications ([place]) - Primary    A1C looks great at 7.6 which is awesome.  She needed to get under 7 to have her surgery.  Discussed the benefits of having a well-controlled A1c to reduce her risk for complications, infections, and improve wound healing for surgery.  We will send this  information over to her surgeon so she is fully medically cleared to proceed with bariatric surgery.        Musculoskeletal and Integument   Seborrheic dermatitis of scalp   Relevant Medications   ketoconazole (NIZORAL) 2 % shampoo    Other Visit Diagnoses    Paresthesia of both hands         Paresthesias-likely secondary to the Trokendi.  Right now she feels like it is minor and we will keep an eye on it as she does not like it is disruptive enough to discontinue the medication.  Meds ordered this encounter  Medications  . ketoconazole (NIZORAL) 2 % shampoo    Sig: Apply to scalp twice a week for 8 weeks and then weekly thereafter  Dispense:  120 mL    Refill:  11    Follow-up: Return in about 3 months (around 12/22/2019) for Diabetes follow-up.    Beatrice Lecher, MD

## 2019-09-22 NOTE — Assessment & Plan Note (Signed)
A1C looks great at 7.6 which is awesome.  She needed to get under 7 to have her surgery.  Discussed the benefits of having a well-controlled A1c to reduce her risk for complications, infections, and improve wound healing for surgery.  We will send this information over to her surgeon so she is fully medically cleared to proceed with bariatric surgery.

## 2019-09-22 NOTE — Assessment & Plan Note (Signed)
GRAIN headaches-so far doing really well with Trokendi though she has had some tingling and paresthesias in both hands.  Discussed that it is not causing any permanent damage and its up to her whether or not it is bothersome enough to continue or discontinue the medication for now she would like to continue the medication as it has been helpful and kept her at work.  We will continue to monitor over the next couple of months.  Follow-up in 3 months.

## 2019-10-05 ENCOUNTER — Telehealth: Payer: Self-pay

## 2019-10-05 NOTE — Telephone Encounter (Signed)
Pt called earlier today stating that she experience numbness in her jaw and trouble standing yesterday. Pt mentioned that the symptoms occurred shortly after taking Trokendi, Metformin and Tramadol. She is no longer having symptoms but is requesting feedback from provider. Pls advise, thanks.

## 2019-10-07 ENCOUNTER — Encounter: Payer: Self-pay | Admitting: Family Medicine

## 2019-10-07 NOTE — Telephone Encounter (Signed)
Patient advised via [place]

## 2019-10-07 NOTE — Telephone Encounter (Signed)
Numbness could be from the Trokendi.  Normally the numbness is in the hands and the feet if it occurs but it certainly possible as far as weakness that is a little unusual and less she is just feeling very sedated on the combination.  That is not typical at all for Metformin.  The combination of Trokendi and tramadol can be very sedating so she may need to avoid mixing those.

## 2019-10-08 ENCOUNTER — Ambulatory Visit (INDEPENDENT_AMBULATORY_CARE_PROVIDER_SITE_OTHER): Payer: 59 | Admitting: Family Medicine

## 2019-10-08 ENCOUNTER — Encounter: Payer: Self-pay | Admitting: Family Medicine

## 2019-10-08 VITALS — BP 126/86 | HR 111 | Ht 64.0 in | Wt 261.0 lb

## 2019-10-08 DIAGNOSIS — F419 Anxiety disorder, unspecified: Secondary | ICD-10-CM

## 2019-10-08 DIAGNOSIS — R202 Paresthesia of skin: Secondary | ICD-10-CM | POA: Insufficient documentation

## 2019-10-08 DIAGNOSIS — E1165 Type 2 diabetes mellitus with hyperglycemia: Secondary | ICD-10-CM

## 2019-10-08 DIAGNOSIS — R718 Other abnormality of red blood cells: Secondary | ICD-10-CM

## 2019-10-08 DIAGNOSIS — R200 Anesthesia of skin: Secondary | ICD-10-CM | POA: Diagnosis not present

## 2019-10-08 DIAGNOSIS — F329 Major depressive disorder, single episode, unspecified: Secondary | ICD-10-CM | POA: Diagnosis not present

## 2019-10-08 MED ORDER — SERTRALINE HCL 25 MG PO TABS: 25.0000 mg | ORAL_TABLET | Freq: Every day | ORAL | 2 refills | Status: DC

## 2019-10-08 MED ORDER — OZEMPIC (1 MG/DOSE) 2 MG/1.5ML ~~LOC~~ SOPN: 1.0000 mg | PEN_INJECTOR | SUBCUTANEOUS | 3 refills | Status: DC

## 2019-10-08 NOTE — Progress Notes (Signed)
Established Patient Office Visit  Subjective:  Patient ID: Jamie Mccarthy, female    DOB: April 22, 1975  Age: 44 y.o. MRN: [id number]  CC:  Chief Complaint  Patient presents with  . Numbness    HPI Jamie Mccarthy presents for paresthesias.  Getting some tingling in her legs and around her mouth.  Took her medicine on Sunday and by the time she went to work started noticing tingling in her jaw and lips and her knees. Lasted a couple of hours.  Went home and rested.  Then occurred again abound Tuesday.  + fatigue, + nightsweats and nausea. Yesterday had some chills.  She says she just has not felt like herself.  She is also been very stressed.  She had a brain MRI on Aug 9th.    Says loves the Trokendi and it has been working well with her migraines.  She is currently taking 50 mg.  She has been on it for about a month now.  Stress is related to the fact that she was actually supposed to have bariatric surgery and the day that she went for the surgery her glucose level was elevated and she was diagnosed with diabetes we have been working hard for several months to get her A1c down.  She says that is actually doing better.  But unfortunately now her surgery has been declined by her health insurance and she keeps trying to call [facility] and is not getting a call back.  Past Medical History:  Diagnosis Date  . Anemia    Has sickle cell trait  . GERD (gastroesophageal reflux disease)    diet controlled  . Gestational diabetes   . H/O vaginal delivery 2010  . Headache(784.0)    Migraines  . Herpes 2007   hx of  . Hypertension    no current medications  . Iron deficiency anemia, unspecified 10/20/2012  . Obese   . PONV (postoperative nausea and vomiting)    has had PONV with previous c/s and also lap band surgery 2004  . Seasonal allergies     Past Surgical History:  Procedure Laterality Date  . BILATERAL SALPINGECTOMY  07/16/2012   Procedure: BILATERAL DISTAL  SALPINGECTOMY;  Surgeon: Logan Bores, MD;  Location: [facility];  Service: Obstetrics;;  . CESAREAN SECTION  2012  . CESAREAN SECTION N/A 07/16/2012   Procedure: REPEAT CESAREAN SECTION;  Surgeon: Logan Bores, MD;  Location: [facility];  Service: Obstetrics;  Laterality: N/A;  . CHOLECYSTECTOMY N/A 11/14/2012   Procedure: LAPAROSCOPIC CHOLECYSTECTOMY;  Surgeon: Gayland Curry, MD;  Location: [facility];  Service: General;  Laterality: N/A;  . GASTRIC BANDING PORT REVISION N/A 11/14/2012   Procedure: GASTRIC BANDING PORT REVISION;  Surgeon: Gayland Curry, MD;  Location: [facility];  Service: General;  Laterality: N/A;  PORT PLACEMENT MOVED NEW MESH INSERTED   . LAPAROSCOPIC GASTRIC BANDING  2005  . LAPAROSCOPIC VAGINAL HYSTERECTOMY WITH SALPINGECTOMY Bilateral 10/12/2015   Procedure: HYSTERECTOMY TOTAL LAPAROSCOPIC BILATERAL SALPINGECTOMY;  Surgeon: Sherlyn Hay, DO;  Location: [facility];  Service: Gynecology;  Laterality: Bilateral;  . reposition gastric band  2006   x2  . TUBAL LIGATION  2014  . WISDOM TOOTH EXTRACTION  1999    Family History  Problem Relation Age of Onset  . Hypertension Mother   . Diabetes Other        father's family  . Thyroid cancer Other   . Breast cancer Maternal Grandmother  Social History   Socioeconomic History  . Marital status: Married    Spouse name: Not on file  . Number of children: Not on file  . Years of education: Not on file  . Highest education level: Not on file  Occupational History  . Occupation: Theatre manager: [employer]  Tobacco Use  . Smoking status: Never Smoker  . Smokeless tobacco: Never Used  Vaping Use  . Vaping Use: Never used  Substance and Sexual Activity  . Alcohol use: Yes    Comment: occ  . Drug use: No  . Sexual activity: Yes    Comment: separated, 2 caffeine drinks daily.   Other Topics Concern  . Not on file  Social History Narrative   2 caffeine drinks per day   Social Determinants of  Health   Financial Resource Strain:   . Difficulty of Paying Living Expenses: Not on file  Food Insecurity:   . Worried About Charity fundraiser in the Last Year: Not on file  . Ran Out of Food in the Last Year: Not on file  Transportation Needs:   . Lack of Transportation (Medical): Not on file  . Lack of Transportation (Non-Medical): Not on file  Physical Activity:   . Days of Exercise per Week: Not on file  . Minutes of Exercise per Session: Not on file  Stress:   . Feeling of Stress : Not on file  Social Connections:   . Frequency of Communication with Friends and Family: Not on file  . Frequency of Social Gatherings with Friends and Family: Not on file  . Attends Religious Services: Not on file  . Active Member of Clubs or Organizations: Not on file  . Attends Archivist Meetings: Not on file  . Marital Status: Not on file  Intimate Partner Violence:   . Fear of Current or Ex-Partner: Not on file  . Emotionally Abused: Not on file  . Physically Abused: Not on file  . Sexually Abused: Not on file    Outpatient Medications Prior to Visit  Medication Sig Dispense Refill  . Accu-Chek FastClix Lancets MISC Apply topically.    . blood glucose meter kit and supplies KIT Dispense based on patient and insurance preference. Use up to twice times daily as directed. (FOR ICD-9 250.00, 250.01). 1 each PRN  . cetirizine (ZYRTEC) 10 MG tablet Take 1 tablet (10 mg total) by mouth daily. 30 tablet 11  . cyclobenzaprine (FLEXERIL) 10 MG tablet Take 1 tablet (10 mg total) by mouth at bedtime. 30 tablet 1  . Dapagliflozin-metFORMIN HCl ER 5-500 MG TB24 Take 1 tablet by mouth in the morning. 30 tablet 2  . Fluocinolone Acetonide 0.01 % OIL Place 3 drops in ear(s) at bedtime. 20 mL 0  . Glucose Blood (BLOOD GLUCOSE TEST STRIPS) STRP Contour Next test strips, patient preference. For testing blood sugars twice daily. Dx: E11.65 200 strip 6  . Insulin Pen Needle (BD PEN NEEDLE NANO U/F)  32G X 4 MM MISC USE TO INJECT INTO SKIN DAILY AS DIRECTED 100 each 2  . ketoconazole (NIZORAL) 2 % shampoo Apply to scalp twice a week for 8 weeks and then weekly thereafter 120 mL 11  . Lancets Misc. KIT Contour Next lancets, patient preference. For testing blood sugars up to twice daily. Dx:E11.65 1 kit 0  . levocetirizine (XYZAL) 5 MG tablet TAKE 1 TABLET BY MOUTH EVERY DAY IN THE EVENING 90 tablet 0  . nystatin (  MYCOSTATIN/NYSTOP) powder SMARTSIG:Liberally Topical Twice Daily    . Nystatin POWD Apply liberally to affected skin twice a day. 1 each 2  . Probiotic Product (PROBIOTIC PO) Take 1 tablet by mouth daily.    . SUMAtriptan (IMITREX) 100 MG tablet TAKE 1 TABLET BY MOUTH EVERY 2HRS AS NEEDED FOR MIGRAINE. (MAY REPEAT IN 2HRS IF HEADACHE PERSIST) (Patient taking differently: Take 100 mg by mouth every 2 (two) hours as needed for migraine. TAKE 1 TABLET BY MOUTH EVERY 2HRS AS NEEDED FOR MIGRAINE. (MAY REPEAT IN 2HRS IF HEADACHE PERSIST)) 10 tablet 2  . Topiramate ER (TROKENDI XR) 50 MG CP24 Take 1 capsule by mouth daily. 30 capsule 11  . tretinoin microspheres (RETIN-A MICRO) 0.04 % gel APPLY TO AFFECTED AREA EVERY DAY AT BEDTIME (Patient taking differently: Apply 1 application topically at bedtime. ) 45 g 0  . fluconazole (DIFLUCAN) 200 MG tablet Take 1 tablet (297m) by mouth every 3 days for 3 doses ( 9 day span) then take 1 tablet by mouth once a week. 6 tablet 0  . Semaglutide,0.25 or 0.5MG/DOS, (OZEMPIC, 0.25 OR 0.5 MG/DOSE,) 2 MG/1.5ML SOPN Inject 0.375 mLs (0.5 mg total) into the skin every 7 (seven) days. 4 pen 1  . traMADol (ULTRAM) 50 MG tablet Take 1-2 tablets (50-100 mg total) by mouth every 8 (eight) hours as needed for moderate pain. Maximum 6 tabs per day. 30 tablet 0   No facility-administered medications prior to visit.    Allergies  Allergen Reactions  . Latex Itching    Pt states she gets severely itchy with latex  . Naproxen Rash    ROS Review of Systems     Objective:    Physical Exam  BP 126/86   Pulse (!) 111   Ht 5' 4"  (1.626 m)   Wt 261 lb (118.4 kg)   LMP 08/29/2015 (Approximate)   SpO2 98%   BMI 44.80 kg/m  Wt Readings from Last 3 Encounters:  10/08/19 261 lb (118.4 kg)  09/22/19 265 lb (120.2 kg)  08/25/19 265 lb (120.2 kg)     Health Maintenance Due  Topic Date Due  . FOOT EXAM  Never done  . OPHTHALMOLOGY EXAM  Never done  . URINE MICROALBUMIN  Never done    There are no preventive care reminders to display for this patient.  Lab Results  Component Value Date   TSH 0.78 07/09/2018   Lab Results  Component Value Date   WBC 7.9 07/09/2019   HGB 13.0 07/09/2019   HCT 40.4 07/09/2019   MCV 79.2 (L) 07/09/2019   PLT 325 07/09/2019   Lab Results  Component Value Date   NA 137 07/09/2019   K 4.4 07/09/2019   CO2 25 07/09/2019   GLUCOSE 286 (H) 07/09/2019   BUN 18 07/09/2019   CREATININE 0.67 07/09/2019   BILITOT 0.8 07/09/2019   ALKPHOS 69 07/09/2019   AST 19 07/09/2019   ALT 20 07/09/2019   PROT 6.9 07/09/2019   ALBUMIN 3.7 07/09/2019   CALCIUM 8.9 07/09/2019   ANIONGAP 9 07/09/2019   Lab Results  Component Value Date   CHOL 171 07/09/2018   Lab Results  Component Value Date   HDL 57 07/09/2018   Lab Results  Component Value Date   LDLCALC 99 07/09/2018   Lab Results  Component Value Date   TRIG 60 07/09/2018   Lab Results  Component Value Date   CHOLHDL 3.0 07/09/2018   Lab Results  Component Value Date  HGBA1C 7.6 (A) 09/22/2019      Assessment & Plan:   Problem List Items Addressed This Visit      Endocrine   Uncontrolled type 2 diabetes mellitus with hyperglycemia ([place]) - Primary    She is really doing well.  Last A1c was down to 7.6 she is not due for repeat for another 2 months.  She is now ready to go up to the 1 mg dose on the Ozempic so new prescription sent to pharmacy today if she has any increased nausea please let me know right now she says it just sort of comes  and goes in waves but it is tolerable.      Relevant Medications   Semaglutide, 1 MG/DOSE, (OZEMPIC, 1 MG/DOSE,) 2 MG/1.5ML SOPN     Other   Paresthesias    Could be possible side effect of Trokendi.  That the perioral numbness is a little bit unusual.  She actually just had a brain MRI 4 weeks ago which makes things like MS much less likely.  Also consider that it could be stress related.  She is requesting to be out of work for the next couple of weeks to have time to regroup.  Evaluate for possible deficiencies.  She did have a low MCV on recent blood work to evaluate for iron deficiency.      Relevant Orders   Fe+TIBC+Fer   CBC   Magnesium   Vitamin B1   Anxiety and depression    Gust options.  We will write her out of work for the next 2 weeks and will go ahead and restart her sertraline she had actually been doing really well and so we had tapered her off.  Start back with 25 mg follow-up in 4 weeks.      Relevant Medications   sertraline (ZOLOFT) 25 MG tablet    Other Visit Diagnoses    Perioral numbness       Relevant Orders   Fe+TIBC+Fer   CBC   Magnesium   Vitamin B1   Low mean corpuscular volume (MCV)       Relevant Orders   Fe+TIBC+Fer      Meds ordered this encounter  Medications  . Semaglutide, 1 MG/DOSE, (OZEMPIC, 1 MG/DOSE,) 2 MG/1.5ML SOPN    Sig: Inject 1 mg into the skin once a week.    Dispense:  6 mL    Refill:  3  . sertraline (ZOLOFT) 25 MG tablet    Sig: Take 1 tablet (25 mg total) by mouth daily.    Dispense:  30 tablet    Refill:  2    Follow-up: Return in about 4 weeks (around 11/05/2019) for Mood.    Beatrice Lecher, MD

## 2019-10-08 NOTE — Assessment & Plan Note (Signed)
She is really doing well.  Last A1c was down to 7.6 she is not due for repeat for another 2 months.  She is now ready to go up to the 1 mg dose on the Ozempic so new prescription sent to pharmacy today if she has any increased nausea please let me know right now she says it just sort of comes and goes in waves but it is tolerable.

## 2019-10-08 NOTE — Assessment & Plan Note (Addendum)
Could be possible side effect of Trokendi.  That the perioral numbness is a little bit unusual.  She actually just had a brain MRI 4 weeks ago which makes things like MS much less likely.  Also consider that it could be stress related.  She is requesting to be out of work for the next couple of weeks to have time to regroup.  Evaluate for possible deficiencies.  She did have a low MCV on recent blood work to evaluate for iron deficiency.

## 2019-10-08 NOTE — Assessment & Plan Note (Signed)
Gust options.  We will write her out of work for the next 2 weeks and will go ahead and restart her sertraline she had actually been doing really well and so we had tapered her off.  Start back with 25 mg follow-up in 4 weeks.

## 2019-10-09 DIAGNOSIS — F419 Anxiety disorder, unspecified: Secondary | ICD-10-CM

## 2019-10-11 ENCOUNTER — Encounter: Payer: Self-pay | Admitting: Family Medicine

## 2019-10-13 LAB — IRON,TIBC AND FERRITIN PANEL
%SAT: 11 % (calc) — ABNORMAL LOW (ref 16–45)
Ferritin: 11 ng/mL — ABNORMAL LOW (ref 16–232)
Iron: 42 ug/dL (ref 40–190)
TIBC: 383 mcg/dL (calc) (ref 250–450)

## 2019-10-13 LAB — CBC
HCT: 43.8 % (ref 35.0–45.0)
Hemoglobin: 14.2 g/dL (ref 11.7–15.5)
MCH: 25.4 pg — ABNORMAL LOW (ref 27.0–33.0)
MCHC: 32.4 g/dL (ref 32.0–36.0)
MCV: 78.2 fL — ABNORMAL LOW (ref 80.0–100.0)
MPV: 9.9 fL (ref 7.5–12.5)
Platelets: 399 10*3/uL (ref 140–400)
RBC: 5.6 10*6/uL — ABNORMAL HIGH (ref 3.80–5.10)
RDW: 15.6 % — ABNORMAL HIGH (ref 11.0–15.0)
WBC: 8.8 10*3/uL (ref 3.8–10.8)

## 2019-10-13 LAB — MAGNESIUM: Magnesium: 2.1 mg/dL (ref 1.5–2.5)

## 2019-10-13 LAB — VITAMIN B1: Vitamin B1 (Thiamine): 6 nmol/L — ABNORMAL LOW (ref 8–30)

## 2019-10-19 ENCOUNTER — Telehealth: Payer: Self-pay | Admitting: *Deleted

## 2019-10-19 ENCOUNTER — Encounter: Payer: Self-pay | Admitting: Family Medicine

## 2019-10-19 NOTE — Telephone Encounter (Signed)
Form completed. Placed in tonya's box

## 2019-10-19 NOTE — Telephone Encounter (Signed)
D/a & physician billing form placed in provider's mailbox.

## 2019-10-19 NOTE — Telephone Encounter (Signed)
Form completed,faxed,confirmation received and scanned into patient's chart.

## 2019-10-20 NOTE — Telephone Encounter (Signed)
I will put 10/4

## 2019-11-03 ENCOUNTER — Telehealth: Payer: Self-pay | Admitting: Family Medicine

## 2019-11-03 ENCOUNTER — Ambulatory Visit (INDEPENDENT_AMBULATORY_CARE_PROVIDER_SITE_OTHER): Payer: 59 | Admitting: Family Medicine

## 2019-11-03 ENCOUNTER — Encounter: Payer: Self-pay | Admitting: Family Medicine

## 2019-11-03 VITALS — BP 129/82 | HR 71 | Ht 64.0 in | Wt 262.0 lb

## 2019-11-03 DIAGNOSIS — E118 Type 2 diabetes mellitus with unspecified complications: Secondary | ICD-10-CM

## 2019-11-03 DIAGNOSIS — E1165 Type 2 diabetes mellitus with hyperglycemia: Secondary | ICD-10-CM | POA: Diagnosis not present

## 2019-11-03 DIAGNOSIS — G43009 Migraine without aura, not intractable, without status migrainosus: Secondary | ICD-10-CM | POA: Diagnosis not present

## 2019-11-03 DIAGNOSIS — F419 Anxiety disorder, unspecified: Secondary | ICD-10-CM

## 2019-11-03 DIAGNOSIS — D509 Iron deficiency anemia, unspecified: Secondary | ICD-10-CM | POA: Diagnosis not present

## 2019-11-03 DIAGNOSIS — F32A Depression, unspecified: Secondary | ICD-10-CM | POA: Diagnosis not present

## 2019-11-03 MED ORDER — AIMOVIG 70 MG/ML ~~LOC~~ SOAJ: 70.0000 mg | SUBCUTANEOUS | 1 refills | Status: DC

## 2019-11-03 MED ORDER — SERTRALINE HCL 50 MG PO TABS: 50.0000 mg | ORAL_TABLET | Freq: Every day | ORAL | 3 refills | Status: DC

## 2019-11-03 MED ORDER — DAPAGLIFLOZIN PRO-METFORMIN ER 5-500 MG PO TB24: 1.0000 | ORAL_TABLET | Freq: Every morning | ORAL | 1 refills | Status: DC

## 2019-11-03 NOTE — Assessment & Plan Note (Signed)
Unfortunately may have to come off Trokendi after her surgery bc it is a capsule and ER product. Regular topiramate didn't work well for her. Consider switch to Aimovig, Ajovy or [employer]

## 2019-11-03 NOTE — Progress Notes (Signed)
Pt would like to discuss her extended release medications (xigduo and trokendi) that she is taking prior to her upcoming surgery on 12/21/2019.  A1C= 6.5%

## 2019-11-03 NOTE — Telephone Encounter (Signed)
Mychart note send regarding meds for Roux en Y

## 2019-11-03 NOTE — Assessment & Plan Note (Signed)
recently started iron supplement. Plan to recheck levels in 6-8 weeks.

## 2019-11-03 NOTE — Assessment & Plan Note (Signed)
She is doing really well and hs made some changes to her diet. Has been working hard on this.  She is now scheduled for surgery in November. She will will have to come off her Xiduo XR to BID version but likely will come off very quickly after surgery.

## 2019-11-03 NOTE — Progress Notes (Signed)
Established Patient Office Visit  Subjective:  Patient ID: Jamie Mccarthy, female    DOB: [birth year]  Age: 44 y.o. MRN: [id number]  CC:  Chief Complaint  Patient presents with  . mood    previous PHQ=14/swd GAD/15/swd    HPI Jamie Mccarthy presents for   Diabetes - no hypoglycemic events. No wounds or sores that are not healing well. No increased thirst or urination. Checking glucose at home. Taking medications as prescribed without any side effects.  She says she is really been working on her diet and made some good changes so she is hopeful that her next A1c will look great before surgery.  Finally got her scheduled for bariatric surgery at the end of November.  In fact they want her to look at the Trokendi and the [place] since they are longer acting drugs.  Did start her iron supplement for iron deficiency she is been taking it once a day so far she has not tried moving up to twice a day on the dose.  Follow-up anxiety/depression-overall she feels that the sertraline is working really well but wonders if we could just of the dose just slightly.     Past Medical History:  Diagnosis Date  . Anemia    Has sickle cell trait  . GERD (gastroesophageal reflux disease)    diet controlled  . Gestational diabetes   . H/O vaginal delivery 2010  . Headache(784.0)    Migraines  . Herpes 2007   hx of  . Hypertension    no current medications  . Iron deficiency anemia, unspecified 10/20/2012  . Obese   . PONV (postoperative nausea and vomiting)    has had PONV with previous c/s and also lap band surgery 2004  . Seasonal allergies     Past Surgical History:  Procedure Laterality Date  . BILATERAL SALPINGECTOMY  07/16/2012   Procedure: BILATERAL DISTAL SALPINGECTOMY;  Surgeon: Logan Bores, MD;  Location: [facility];  Service: Obstetrics;;  . CESAREAN SECTION  2012  . CESAREAN SECTION N/A 07/16/2012   Procedure: REPEAT CESAREAN SECTION;  Surgeon: Logan Bores, MD;   Location: [facility];  Service: Obstetrics;  Laterality: N/A;  . CHOLECYSTECTOMY N/A 11/14/2012   Procedure: LAPAROSCOPIC CHOLECYSTECTOMY;  Surgeon: Gayland Curry, MD;  Location: [facility];  Service: General;  Laterality: N/A;  . GASTRIC BANDING PORT REVISION N/A 11/14/2012   Procedure: GASTRIC BANDING PORT REVISION;  Surgeon: Gayland Curry, MD;  Location: [facility];  Service: General;  Laterality: N/A;  PORT PLACEMENT MOVED NEW MESH INSERTED   . LAPAROSCOPIC GASTRIC BANDING  2005  . LAPAROSCOPIC VAGINAL HYSTERECTOMY WITH SALPINGECTOMY Bilateral 10/12/2015   Procedure: HYSTERECTOMY TOTAL LAPAROSCOPIC BILATERAL SALPINGECTOMY;  Surgeon: Sherlyn Hay, DO;  Location: [facility];  Service: Gynecology;  Laterality: Bilateral;  . reposition gastric band  2006   x2  . TUBAL LIGATION  2014  . WISDOM TOOTH EXTRACTION  1999    Family History  Problem Relation Age of Onset  . Hypertension Mother   . Diabetes Other        father's family  . Thyroid cancer Other   . Breast cancer Maternal Grandmother     Social History   Socioeconomic History  . Marital status: Married    Spouse name: Not on file  . Number of children: Not on file  . Years of education: Not on file  . Highest education level: Not on file  Occupational History  . Occupation: Technical sales engineer  Employer: [employer]  Tobacco Use  . Smoking status: Never Smoker  . Smokeless tobacco: Never Used  Vaping Use  . Vaping Use: Never used  Substance and Sexual Activity  . Alcohol use: Yes    Comment: occ  . Drug use: No  . Sexual activity: Yes    Comment: separated, 2 caffeine drinks daily.   Other Topics Concern  . Not on file  Social History Narrative   2 caffeine drinks per day   Social Determinants of Health   Financial Resource Strain:   . Difficulty of Paying Living Expenses: Not on file  Food Insecurity:   . Worried About Charity fundraiser in the Last Year: Not on file  . Ran Out of Food in the Last Year: Not on file   Transportation Needs:   . Lack of Transportation (Medical): Not on file  . Lack of Transportation (Non-Medical): Not on file  Physical Activity:   . Days of Exercise per Week: Not on file  . Minutes of Exercise per Session: Not on file  Stress:   . Feeling of Stress : Not on file  Social Connections:   . Frequency of Communication with Friends and Family: Not on file  . Frequency of Social Gatherings with Friends and Family: Not on file  . Attends Religious Services: Not on file  . Active Member of Clubs or Organizations: Not on file  . Attends Archivist Meetings: Not on file  . Marital Status: Not on file  Intimate Partner Violence:   . Fear of Current or Ex-Partner: Not on file  . Emotionally Abused: Not on file  . Physically Abused: Not on file  . Sexually Abused: Not on file    Outpatient Medications Prior to Visit  Medication Sig Dispense Refill  . Accu-Chek FastClix Lancets MISC Apply topically.    . blood glucose meter kit and supplies KIT Dispense based on patient and insurance preference. Use up to twice times daily as directed. (FOR ICD-9 250.00, 250.01). 1 each PRN  . cetirizine (ZYRTEC) 10 MG tablet Take 1 tablet (10 mg total) by mouth daily. 30 tablet 11  . fluconazole (DIFLUCAN) 200 MG tablet Take by mouth.    . Glucose Blood (BLOOD GLUCOSE TEST STRIPS) STRP Contour Next test strips, patient preference. For testing blood sugars twice daily. Dx: E11.65 200 strip 6  . Insulin Pen Needle (BD PEN NEEDLE NANO U/F) 32G X 4 MM MISC USE TO INJECT INTO SKIN DAILY AS DIRECTED 100 each 2  . ketoconazole (NIZORAL) 2 % shampoo Apply to scalp twice a week for 8 weeks and then weekly thereafter 120 mL 11  . Lancets Misc. KIT Contour Next lancets, patient preference. For testing blood sugars up to twice daily. Dx:E11.65 1 kit 0  . levocetirizine (XYZAL) 5 MG tablet TAKE 1 TABLET BY MOUTH EVERY DAY IN THE EVENING 90 tablet 0  . Nystatin POWD Apply liberally to affected skin  twice a day. 1 each 2  . Probiotic Product (PROBIOTIC PO) Take 1 tablet by mouth daily.    . Semaglutide, 1 MG/DOSE, (OZEMPIC, 1 MG/DOSE,) 2 MG/1.5ML SOPN Inject 1 mg into the skin once a week. 6 mL 3  . SUMAtriptan (IMITREX) 100 MG tablet TAKE 1 TABLET BY MOUTH EVERY 2HRS AS NEEDED FOR MIGRAINE. (MAY REPEAT IN 2HRS IF HEADACHE PERSIST) (Patient taking differently: Take 100 mg by mouth every 2 (two) hours as needed for migraine. TAKE 1 TABLET BY MOUTH EVERY 2HRS AS  NEEDED FOR MIGRAINE. (MAY REPEAT IN 2HRS IF HEADACHE PERSIST)) 10 tablet 2  . Topiramate ER (TROKENDI XR) 50 MG CP24 Take 1 capsule by mouth daily. 30 capsule 11  . tretinoin microspheres (RETIN-A MICRO) 0.04 % gel APPLY TO AFFECTED AREA EVERY DAY AT BEDTIME (Patient taking differently: Apply 1 application topically at bedtime. ) 45 g 0  . Dapagliflozin-metFORMIN HCl ER 5-500 MG TB24 Take 1 tablet by mouth in the morning. 30 tablet 2  . sertraline (ZOLOFT) 25 MG tablet Take 1 tablet (25 mg total) by mouth daily. 30 tablet 2  . cyclobenzaprine (FLEXERIL) 10 MG tablet Take 1 tablet (10 mg total) by mouth at bedtime. 30 tablet 1  . Fluocinolone Acetonide 0.01 % OIL Place 3 drops in ear(s) at bedtime. 20 mL 0  . nystatin (MYCOSTATIN/NYSTOP) powder SMARTSIG:Liberally Topical Twice Daily     No facility-administered medications prior to visit.    Allergies  Allergen Reactions  . Latex Itching    Pt states she gets severely itchy with latex  . Naproxen Rash    ROS Review of Systems    Objective:    Physical Exam Constitutional:      Appearance: She is well-developed.  HENT:     Head: Normocephalic and atraumatic.  Cardiovascular:     Rate and Rhythm: Normal rate and regular rhythm.     Heart sounds: Normal heart sounds.  Pulmonary:     Effort: Pulmonary effort is normal.     Breath sounds: Normal breath sounds.  Skin:    General: Skin is warm and dry.  Neurological:     Mental Status: She is alert and oriented to person,  place, and time.  Psychiatric:        Behavior: Behavior normal.     BP 129/82   Pulse 71   Ht 5' 4"  (1.626 m)   Wt 262 lb (118.8 kg)   LMP 08/29/2015 (Approximate)   SpO2 100%   BMI 44.97 kg/m  Wt Readings from Last 3 Encounters:  11/03/19 262 lb (118.8 kg)  10/08/19 261 lb (118.4 kg)  09/22/19 265 lb (120.2 kg)     Health Maintenance Due  Topic Date Due  . FOOT EXAM  Never done  . OPHTHALMOLOGY EXAM  Never done  . URINE MICROALBUMIN  Never done    There are no preventive care reminders to display for this patient.  Lab Results  Component Value Date   TSH 0.78 07/09/2018   Lab Results  Component Value Date   WBC 8.8 10/08/2019   HGB 14.2 10/08/2019   HCT 43.8 10/08/2019   MCV 78.2 (L) 10/08/2019   PLT 399 10/08/2019   Lab Results  Component Value Date   NA 137 07/09/2019   K 4.4 07/09/2019   CO2 25 07/09/2019   GLUCOSE 286 (H) 07/09/2019   BUN 18 07/09/2019   CREATININE 0.67 07/09/2019   BILITOT 0.8 07/09/2019   ALKPHOS 69 07/09/2019   AST 19 07/09/2019   ALT 20 07/09/2019   PROT 6.9 07/09/2019   ALBUMIN 3.7 07/09/2019   CALCIUM 8.9 07/09/2019   ANIONGAP 9 07/09/2019   Lab Results  Component Value Date   CHOL 171 07/09/2018   Lab Results  Component Value Date   HDL 57 07/09/2018   Lab Results  Component Value Date   LDLCALC 99 07/09/2018   Lab Results  Component Value Date   TRIG 60 07/09/2018   Lab Results  Component Value Date   CHOLHDL 3.0  07/09/2018   Lab Results  Component Value Date   HGBA1C 7.6 (A) 09/22/2019      Assessment & Plan:   Problem List Items Addressed This Visit      Cardiovascular and Mediastinum   Migraine headache without aura    Unfortunately may have to come off Trokendi after her surgery bc it is a capsule and ER product. Regular topiramate didn't work well for her. Consider switch to Aimovig, Ajovy or Emgality      Relevant Medications   sertraline (ZOLOFT) 50 MG tablet   Erenumab-aooe (AIMOVIG)  70 MG/ML SOAJ     Endocrine   Controlled diabetes mellitus type 2 with complications ([place])    She is doing really well and hs made some changes to her diet. Has been working hard on this.  She is now scheduled for surgery in November. She will will have to come off her Xiduo XR to BID version but likely will come off very quickly after surgery.        Relevant Medications   Dapagliflozin-metFORMIN HCl ER 5-500 MG TB24     Other   Iron deficiency anemia, unspecified    recently started iron supplement. Plan to recheck levels in 6-8 weeks.       Anxiety and depression - Primary   Relevant Medications   sertraline (ZOLOFT) 50 MG tablet    Other Visit Diagnoses    Uncontrolled type 2 diabetes mellitus with hyperglycemia (HCC)       Relevant Medications   Dapagliflozin-metFORMIN HCl ER 5-500 MG TB24      Meds ordered this encounter  Medications  . sertraline (ZOLOFT) 50 MG tablet    Sig: Take 1 tablet (50 mg total) by mouth daily.    Dispense:  30 tablet    Refill:  3  . Dapagliflozin-metFORMIN HCl ER 5-500 MG TB24    Sig: Take 1 tablet by mouth in the morning.    Dispense:  30 tablet    Refill:  1  . Erenumab-aooe (AIMOVIG) 70 MG/ML SOAJ    Sig: Inject 70 mg into the skin every 30 (thirty) days.    Dispense:  1.12 mL    Refill:  1    Follow-up: No follow-ups on file.    Beatrice Lecher, MD

## 2019-11-13 ENCOUNTER — Telehealth: Payer: Self-pay | Admitting: Dietician

## 2019-11-13 NOTE — Telephone Encounter (Signed)
Attempted to call patient for a Pre-Op nutrition update and to assess patient's understanding. Patient previously received nutrition education at the Pre-Op Class on 06/08/2019. Surgery is scheduled for 12/21/2019.    LVM, will try to contact patient on Monday.    Nat Christen Matheny) Rashard Ryle, MS, RD, LDN

## 2019-11-20 ENCOUNTER — Telehealth: Payer: Self-pay | Admitting: Dietician

## 2019-11-20 NOTE — Telephone Encounter (Signed)
I called patient to assess their understanding of the pre-op nutrition recommendations through the teach back method to ensure their knowledge and readiness in preparation for surgery.   Surgery Date: 12/21/2019 Surgery Type: RYGB  Patient's questions were answered. We reviewed that the patient is to start the pre-op diet on 12/07/2019.    Jamie Mccarthy) Adryan Shin, MS, RD, LDN

## 2019-12-03 ENCOUNTER — Ambulatory Visit: Payer: Self-pay | Admitting: General Surgery

## 2019-12-03 NOTE — H&P (Signed)
Jamie Mccarthy Appointment: 12/03/2019 2:00 PM Location: [facility] Patient #: [id number] DOB: 05-01-1975 Married / Language: English / Race: Black or African American Female  History of Present Illness [name] MD; 12/03/2019 3:18 PM) The patient is a 44 year old female is here for LapBand followup. Jamie Mccarthy is a 44 year old patient who received a VG band at [facility] in 2004 (there are apparently no records of her procedure). She transferred her care to [place] in 2014. She underwent laparoscopic cholecystectomy and port revision by me that year. She comes in for follow-up regarding her severe obesity. I last saw her in june 2021 for a preop appt. her revisional surgery was cancelled bc her preop A1c had increased to over 10. She was referred back to her primary care team who started her on metformin and ozempic. She reports her blood sugars are much better controlled. The last A1c that I saw was 7.6 on August 31. She states that she has had another one since then and it was down to 6.4. She is also seen her PCP on September 16 as well as again October 12. I reviewed those notes. I also reviewed the notes from August 2021 as well. She is demonstrated compliance in following up with her primary care team. Her iron level was checked in September. It was low normal her ferritin level was low at 11. She was started on over-the-counter iron. She also had a low vitamin B1 at 6. She started an over-the-counter complex b vitamin. She also had an MRI of her brain which demonstrated findings consistent with migraines. Otherwise she denies any medical changes. She denies any chest pain, chest pressure, source of breath. She is down 10 pounds since I saw her in June.  we removed all the fluid from her band given her abnormal upper GI. She states that since removing the fluid she is no longer coughing at night and has had no heartburn or reflux. No regurgitation. She has  completed the pathway for revisional surgery. She has received psychological clearance. Otherwise she denies any changes since I last saw her in the end of April. She denies any chest pain, chest pressure, shortness of breath, orthopnea, dyspnea exertion, paroxysmal nocturnal dyspnea. No tobacco.    UGI winter 2021 1. On initial scout KUB, the lap band appears differentially oriented compared to the prior study, at a 1 o'clock to 7 o'clock position today as opposed to 2:00 to 8:00 previously. On more delayed imaging after drinking barium on today's study, the lap band does have a more typical 2 o'clock to 8 o'clock orientation. 2. Diffusely patulous esophagus, similar to prior with delayed passage of contrast through the lower esophageal sphincter into the gastric cardia proximal to the lap band. However, contrast material does ultimately flow through the lower esophageal sphincter. 3. Expected appearance of contrast migration through the lap band into the distal stomach.  04/23/19 Jamie Mccarthy is a 44 year old patient who received a VG band at [facility] in 2004 (there are apparently no records of her procedure). She transferred her care to [place] in 2014. She underwent laparoscopic cholecystectomy and port revision by me that year. She was last in the office in Feb 2021. Her weight at that time was 258 pounds. Prior visit weight in 10/2017 was 201 lbs. Her reported preoperative weight in 2004 was 299 pounds. She came in februrary 2021 with complaints of able to eat large portions of food. She stated that she  has no restriction. She had a maximal amount of fluid that her band would accommodate. Based on her sensation of no restriction I ordered an upper GI to evaluate the position of her band. Unfortunately it demonstrated significant dilated patulous esophagus with delayed passage of contrast through the lower esophageal sphincter into the gastric cardia proximal to the lap band. Initially  there looked like there may be a slip but on subsequent imaging the band appeared to be in she comes in today for fluid removal as well as discuss revision surgery. She denies any changes since she was last seen.   She completed our Neurosurgeon. She is been working on Administrator, sports and being consistent with her food choices. Despite these efforts she has not been able to lose significant weight.  She denies any chest pain, chest pressure, source of breath, orthopnea, paroxysmal nocturnal dyspnea, dyspnea on exertion, TIAs or amaurosis fugax. She denies any personal or family history of blood clots. She does have reflux. Her sleep apnea questionnaire was low risk. She has had a cholecystectomy and laparoscopic adjustable gastric band procedure. She has a bowel movement every several days. She denies any melena or hematochezia. She is currently not regurgitating. She denies any dysuria or hematuria. She had a partial hysterectomy in 2017. She does have left knee pain and has been diagnosed with HER arthritis. She denies a diagnosis of diabetes mellitus. She denies any severe headaches or migraines. She denies any blurry vision.  She denies any tobacco or drug use. She drinks alcohol infrequently.   Problem List/Past Medical Randall Hiss M. Redmond Pulling, MD; 12/03/2019 3:20 PM) OSTEOARTHRITIS OF KNEE, UNILATERAL (M17.10) FITTING AND ADJUSTMENT OF GASTRIC LAP BAND (Z46.51) OVEREATING (R63.2) DIABETES MELLITUS TYPE 2 IN OBESE (E11.69) THIAMIN DEFICIENCY (E51.9) SEVERE OBESITY (E66.01) GASTRIC BANDING STATUS (Z98.84) LAP[facility]BAND placed at [facility] in 2004; port revision; It is facing the RIGHT side of her body. It is slightly lateral to her incision and I came straight down almost at a 45 angle.  Past Surgical History Randall Hiss M. Redmond Pulling, MD; 12/03/2019 3:20 PM) Cesarean Section - Multiple Gallbladder Surgery - Laparoscopic Hysterectomy (not due to cancer) - Partial Lap Band Oral  Surgery Cesarean Section - 1  Diagnostic Studies History Randall Hiss M. Redmond Pulling, MD; 12/03/2019 3:20 PM) Colonoscopy never Mammogram within last year never Pap Smear 1-5 years ago  Allergies (Tanisha A. Owens Shark, [place]; 12/03/2019 2:07 PM) Latex Exam Gloves *MEDICAL DEVICES AND SUPPLIES* Naproxen *ANALGESICS - ANTI-INFLAMMATORY* Allergies Reconciled  Medication History (Tanisha A. Owens Shark, [place]; 12/03/2019 2:09 PM) Fluconazole (200MG  Tablet, Oral) Active. Dapagliflozin-metFORMIN HCl ER (Oral) Specific strength unknown - Active. Sertraline HCl (50MG  Tablet, Oral) Active. Ketoconazole (2% Shampoo, External) Active. Xyzal (Oral) Specific strength unknown - Active. Trokendi XR (50MG  Capsule ER 24HR, Oral) Active. Ozempic (1 MG/DOSE) (4MG /3ML Soln Pen-inj, Subcutaneous) Active. Iron (Oral) Specific strength unknown - Active. B Complex (Oral) Active. Medications Reconciled  Social History Randall Hiss M. Redmond Pulling, MD; 12/03/2019 3:20 PM) Alcohol use Occasional alcohol use. Caffeine use Carbonated beverages, Coffee, Tea. No drug use Tobacco use Never smoker.  Family History Randall Hiss M. Redmond Pulling, MD; 12/03/2019 3:20 PM) Breast Cancer Family Members In General. Cerebrovascular Accident Brother. Heart Disease Family Members In General, Father. Hypertension Mother. Thyroid problems Mother. Diabetes Mellitus Family Members In General.  Pregnancy / Birth History Leighton Ruff. Redmond Pulling, MD; 12/03/2019 3:20 PM) Age at menarche 25 years. Gravida 3 Length (months) of breastfeeding 12-24 Maternal age 51-30 Para 3 4 Regular periods Irregular periods Contraceptive History Contraceptive implant.  Other Problems (  Leighton Ruff. Redmond Pulling, MD; 12/03/2019 3:20 PM) GASTROESOPHAGEAL REFLUX DISEASE, UNSPECIFIED WHETHER ESOPHAGITIS PRESENT (K21.9) Migraine Headache    Vitals (Tanisha A. Brown RMA; 12/03/2019 2:09 PM) 12/03/2019 2:09 PM Weight: 259.4 lb Height: 64in Body Surface Area: 2.18  m Body Mass Index: 44.53 kg/m  Temp.: 96.68F  Pulse: 100 (Regular)  BP: 126/72(Sitting, Left Arm, Standard)        Physical Exam Randall Hiss M. Elaine Roanhorse MD; 12/03/2019 3:13 PM)  General Mental Status-Alert. General Appearance-Consistent with stated age. Posture-Normal posture. Voice-Normal. Note: severe obesity  Integumentary Note: no rash or lesion on limited exam  Head and Neck Head -Note:atraumatic, normocephalic.  Face Strength and Tone - facial muscle strength and tone is normal.  Eye Eyeball - Bilateral-Extraocular movements intact. Sclera/Conjunctiva - Bilateral-Normal.  Chest and Lung Exam Chest and lung exam reveals -quiet, even and easy respiratory effort with no use of accessory muscles. Auscultation Breath sounds - Normal.  Abdomen Note: soft, nontender, old incisions, no hepatosplenomegaly, no hernia  Neuropsychiatric Mental status exam performed with findings of-able to articulate well with normal speech/language, rate, volume and coherence. The patient's mood and affect are described as -normal. Judgment and Insight-insight is appropriate concerning matters relevant to self and the patient displays appropriate judgment regarding every day activities. Thought Processes/Cognitive Function-aware of current events.  Musculoskeletal Note: strength symmetrical throughout, no deformity    Assessment & Plan Randall Hiss M. Sarie Stall MD; 12/03/2019 3:20 PM)  SEVERE OBESITY (E66.01) Impression: Please see my office note from her visit in late March or early April regarding our discussion and rationale regarding the need to remove her adjustable gastric band and convert her to a Roux-en-Y gastric bypass.  We had previously discussed revisional surgery in quite detail. Offered to rediscussed the steps of the procedure today along with risk and benefits again. She states that she was comfortable with our prior discussion as well as with  additional research she has done. We did discuss that the first portion of the procedure would be to remove her band. If there is no complications during that portion of the procedure we would proceed with same-day conversion to Roux-en-Y gastric bypass. We did discuss that sometimes we have to do this in a two-stage procedure should there be issues with band removal. We also rediscussed that revision patients can be a slightly increased risk for enterotomy, bleeding, increased length of stay as well as readmission. However I think the benefits still outweigh the potential risk. We discussed the importance of the preoperative meal plan. All of her questions have been asked and answered.  This patient encounter took 27 minutes today to perform the following: take history, perform exam, review outside records, interpret imaging, counsel the patient on their diagnosis and document encounter, findings & plan in the EHR  Current Plans Pt Education - EMW_preopbariatric  GASTRIC BANDING STATUS (Z98.84) Story: LAP-BAND placed at [facility] in 2004; port revision; It is facing the RIGHT side of her body. It is slightly lateral to her incision and I came straight down almost at a 45 angle. Impression: We have been trying to get her band with optimal volume. Unfortunately she has a diffusely patulous esophagus with delayed passage of contrast through the band area.  Even though she is symptomatically better since we have removed the fluid from her band I still think the band needs to be surgically removed. We have tried for several years to reach the optimal volume without her having heartburn, regurgitation and have just not been successful. Moreover she  is starting to have evidence of of esophageal abnormality. Therefore I think it is important to remove her band and the patient is interested in revision surgery with conversion to Roux-en-Y. I still think that is appropriate.   DIABETES MELLITUS TYPE 2 IN OBESE  (E11.69) Impression: Surgery was canceled December because of elevated A1c which was unusual for her but she is demonstrated compliance and her A1c is much improved so I think we can proceed with surgery   OSTEOARTHRITIS OF KNEE, UNILATERAL (M17.10)   GASTROESOPHAGEAL REFLUX DISEASE, UNSPECIFIED WHETHER ESOPHAGITIS PRESENT (K21.9)   Migraine Headache   THIAMIN DEFICIENCY (E51.9) Impression: Her thiamine level was low when checked in September by her primary care doctor. She states that she has been on a complex B vitamin. We'll check a B1 and B12 level today  Current Plans THIAMINE (B-1) (84425) VITAMIN B-12 (CYANOCOBALAMIN) (71959)  Leighton Ruff. Redmond Pulling, MD, FACS General, Bariatric, & Minimally Invasive Surgery [facility], [place]

## 2019-12-03 NOTE — H&P (View-Only) (Signed)
Jamie Mccarthy Appointment: 12/03/2019 2:00 PM Location: [facility] Patient #: [id number] DOB: [birth year] Married / Language: English / Race: Black or African American Female  History of Present Illness Jamie Hiss M. Octavis Sheeler MD; 12/03/2019 3:18 PM) The patient is a 44 year old female is here for LapBand followup. Jamie Mccarthy is a 44 year old patient who received a VG band at [facility] in 2004 (there are apparently no records of her procedure). She transferred her care to [place] in 2014. She underwent laparoscopic cholecystectomy and port revision by me that year. She comes in for follow-up regarding her severe obesity. I last saw her in june 2021 for a preop appt. her revisional surgery was cancelled bc her preop A1c had increased to over 10. She was referred back to her primary care team who started her on metformin and ozempic. She reports her blood sugars are much better controlled. The last A1c that I saw was 7.6 on August 31. She states that she has had another one since then and it was down to 6.4. She is also seen her PCP on September 16 as well as again October 12. I reviewed those notes. I also reviewed the notes from August 2021 as well. She is demonstrated compliance in following up with her primary care team. Her iron level was checked in September. It was low normal her ferritin level was low at 11. She was started on over-the-counter iron. She also had a low vitamin B1 at 6. She started an over-the-counter complex b vitamin. She also had an MRI of her brain which demonstrated findings consistent with migraines. Otherwise she denies any medical changes. She denies any chest pain, chest pressure, source of breath. She is down 10 pounds since I saw her in June.  we removed all the fluid from her band given her abnormal upper GI. She states that since removing the fluid she is no longer coughing at night and has had no heartburn or reflux. No regurgitation. She has  completed the pathway for revisional surgery. She has received psychological clearance. Otherwise she denies any changes since I last saw her in the end of April. She denies any chest pain, chest pressure, shortness of breath, orthopnea, dyspnea exertion, paroxysmal nocturnal dyspnea. No tobacco.    UGI winter 2021 1. On initial scout KUB, the lap band appears differentially oriented compared to the prior study, at a 1 o'clock to 7 o'clock position today as opposed to 2:00 to 8:00 previously. On more delayed imaging after drinking barium on today's study, the lap band does have a more typical 2 o'clock to 8 o'clock orientation. 2. Diffusely patulous esophagus, similar to prior with delayed passage of contrast through the lower esophageal sphincter into the gastric cardia proximal to the lap band. However, contrast material does ultimately flow through the lower esophageal sphincter. 3. Expected appearance of contrast migration through the lap band into the distal stomach.  04/23/19 Jamie Mccarthy is a 44 year old patient who received a VG band at [facility] in 2004 (there are apparently no records of her procedure). She transferred her care to [place] in 2014. She underwent laparoscopic cholecystectomy and port revision by me that year. She was last in the office in Feb 2021. Her weight at that time was 258 pounds. Prior visit weight in 10/2017 was 201 lbs. Her reported preoperative weight in 2004 was 299 pounds. She came in februrary 2021 with complaints of able to eat large portions of food. She stated that she  has no restriction. She had a maximal amount of fluid that her band would accommodate. Based on her sensation of no restriction I ordered an upper GI to evaluate the position of her band. Unfortunately it demonstrated significant dilated patulous esophagus with delayed passage of contrast through the lower esophageal sphincter into the gastric cardia proximal to the lap band. Initially  there looked like there may be a slip but on subsequent imaging the band appeared to be in she comes in today for fluid removal as well as discuss revision surgery. She denies any changes since she was last seen.   She completed our Neurosurgeon. She is been working on Administrator, sports and being consistent with her food choices. Despite these efforts she has not been able to lose significant weight.  She denies any chest pain, chest pressure, source of breath, orthopnea, paroxysmal nocturnal dyspnea, dyspnea on exertion, TIAs or amaurosis fugax. She denies any personal or family history of blood clots. She does have reflux. Her sleep apnea questionnaire was low risk. She has had a cholecystectomy and laparoscopic adjustable gastric band procedure. She has a bowel movement every several days. She denies any melena or hematochezia. She is currently not regurgitating. She denies any dysuria or hematuria. She had a partial hysterectomy in 2017. She does have left knee pain and has been diagnosed with HER arthritis. She denies a diagnosis of diabetes mellitus. She denies any severe headaches or migraines. She denies any blurry vision.  She denies any tobacco or drug use. She drinks alcohol infrequently.   Problem List/Past Medical Jamie Hiss M. 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 It is slightly lateral to her incision and I came straight down almost at a 45 angle.  Past Surgical History Jamie Hiss M. Redmond Pulling, MD; 12/03/2019 3:20 PM) Cesarean Section - Multiple Gallbladder Surgery - Laparoscopic Hysterectomy (not due to cancer) - Partial Lap Band Oral  Surgery Cesarean Section - 1  Diagnostic Studies History Jamie Hiss M. Redmond Pulling, MD; 12/03/2019 3:20 PM) Colonoscopy never Mammogram within last year never Pap Smear 1-5 years ago  Allergies (Jamie A. Owens Shark, [place]; 12/03/2019 2:07 PM) Latex Exam Gloves *MEDICAL DEVICES AND SUPPLIES* Naproxen *ANALGESICS - ANTI-INFLAMMATORY* Allergies Reconciled  Medication History (Jamie A. Owens Shark, [place]; 12/03/2019 2:09 PM) Fluconazole (200MG  Tablet, Oral) Active. Dapagliflozin-metFORMIN HCl ER (Oral) Specific strength unknown - Active. Sertraline HCl (50MG  Tablet, Oral) Active. Ketoconazole (2% Shampoo, External) Active. Xyzal (Oral) Specific strength unknown - Active. Trokendi XR (50MG  Capsule ER 24HR, Oral) Active. Ozempic (1 MG/DOSE) (4MG /3ML Soln Pen-inj, Subcutaneous) Active. Iron (Oral) Specific strength unknown - Active. B Complex (Oral) Active. Medications Reconciled  Social History Jamie Hiss M. Redmond Pulling, MD; 12/03/2019 3:20 PM) Alcohol use Occasional alcohol use. Caffeine use Carbonated beverages, Coffee, Tea. No drug use Tobacco use Never smoker.  Family History Jamie Hiss M. Redmond Pulling, MD; 12/03/2019 3:20 PM) Breast Cancer Family Members In General. Cerebrovascular Accident Brother. Heart Disease Family Members In General, Father. Hypertension Mother. Thyroid problems Mother. Diabetes Mellitus Family Members In General.  Pregnancy / Birth History Jamie Ruff. Redmond Pulling, MD; 12/03/2019 3:20 PM) Age at menarche 48 years. Gravida 3 Length (months) of breastfeeding 12-24 Maternal age 10-30 Para 3 4 Regular periods Irregular periods Contraceptive History Contraceptive implant.  Other Problems (  Jamie Ruff. Redmond Pulling, MD; 12/03/2019 3:20 PM) GASTROESOPHAGEAL REFLUX DISEASE, UNSPECIFIED WHETHER ESOPHAGITIS PRESENT (K21.9) Migraine Headache    Vitals (Jamie Mccarthy; 12/03/2019 2:09 PM) 12/03/2019 2:09 PM Weight: 259.4 lb Height: 64in Body Surface Area: 2.18  m Body Mass Index: 44.53 kg/m  Temp.: 96.49F  Pulse: 100 (Regular)  BP: 126/72(Sitting, Left Arm, Standard)        Physical Exam Jamie Hiss M. Adea Geisel MD; 12/03/2019 3:13 PM)  General Mental Status-Alert. General Appearance-Consistent with stated age. Posture-Normal posture. Voice-Normal. Note: severe obesity  Integumentary Note: no rash or lesion on limited exam  Head and Neck Head -Note:atraumatic, normocephalic.  Face Strength and Tone - facial muscle strength and tone is normal.  Eye Eyeball - Bilateral-Extraocular movements intact. Sclera/Conjunctiva - Bilateral-Normal.  Chest and Lung Exam Chest and lung exam reveals -quiet, even and easy respiratory effort with no use of accessory muscles. Auscultation Breath sounds - Normal.  Abdomen Note: soft, nontender, old incisions, no hepatosplenomegaly, no hernia  Neuropsychiatric Mental status exam performed with findings of-able to articulate well with normal speech/language, rate, volume and coherence. The patient's mood and affect are described as -normal. Judgment and Insight-insight is appropriate concerning matters relevant to self and the patient displays appropriate judgment regarding every day activities. Thought Processes/Cognitive Function-aware of current events.  Musculoskeletal Note: strength symmetrical throughout, no deformity    Assessment & Plan Jamie Hiss M. Perlie Stene MD; 12/03/2019 3:20 PM)  SEVERE OBESITY (E66.01) Impression: Please see my office note from her visit in late March or early April regarding our discussion and rationale regarding the need to remove her adjustable gastric band and convert her to a Roux-en-Y gastric bypass.  We had previously discussed revisional surgery in quite detail. Offered to rediscussed the steps of the procedure today along with risk and benefits again. She states that she was comfortable with our prior discussion as well as with  additional research she has done. We did discuss that the first portion of the procedure would be to remove her band. If there is no complications during that portion of the procedure we would proceed with same-day conversion to Roux-en-Y gastric bypass. We did discuss that sometimes we have to do this in a two-stage procedure should there be issues with band removal. We also rediscussed that revision patients can be a slightly increased risk for enterotomy, bleeding, increased length of stay as well as readmission. However I think the benefits still outweigh the potential risk. We discussed the importance of the preoperative meal plan. All of her questions have been asked and answered.  This patient encounter took 27 minutes today to perform the following: take history, perform exam, review outside records, interpret imaging, counsel the patient on their diagnosis and document encounter, findings & plan in the EHR  Current Plans Pt Education - EMW_preopbariatric  GASTRIC BANDING STATUS (Z98.84) Story: LAP[facility]BAND placed at [facility] in [facility]; port revision; It is facing the RIGHT side of her body. It is slightly lateral to her incision and I came straight down almost at a 45 angle. Impression: We have been trying to get her band with optimal volume. Unfortunately she has a diffusely patulous esophagus with delayed passage of contrast through the band area.  Even though she is symptomatically better since we have removed the fluid from her band I still think the band needs to be surgically removed. We have tried for several years to reach the optimal volume without her having heartburn, regurgitation and have just not been successful. Moreover she  is starting to have evidence of of esophageal abnormality. Therefore I think it is important to remove her band and the patient is interested in revision surgery with conversion to Roux-en-Y. I still think that is appropriate.   DIABETES MELLITUS TYPE 2 IN OBESE  (E11.69) Impression: Surgery was canceled December because of elevated A1c which was unusual for her but she is demonstrated compliance and her A1c is much improved so I think we can proceed with surgery   OSTEOARTHRITIS OF KNEE, UNILATERAL (M17.10)   GASTROESOPHAGEAL REFLUX DISEASE, UNSPECIFIED WHETHER ESOPHAGITIS PRESENT (K21.9)   Migraine Headache   THIAMIN DEFICIENCY (E51.9) Impression: Her thiamine level was low when checked in September by her primary care doctor. She states that she has been on a complex B vitamin. We'll check a B1 and B12 level today  Current Plans THIAMINE (B-1) (84425) VITAMIN B-12 (CYANOCOBALAMIN) (82800)  Jamie Ruff. Redmond Pulling, MD, FACS General, Bariatric, & Minimally Invasive Surgery [facility], [place]

## 2019-12-07 NOTE — Patient Instructions (Signed)
DUE TO COVID-19 ONLY ONE VISITOR IS ALLOWED TO COME WITH YOU AND STAY IN THE WAITING ROOM ONLY DURING PRE OP AND PROCEDURE DAY OF SURGERY. THE 1 VISITOR  MAY VISIT WITH YOU AFTER SURGERY IN YOUR PRIVATE ROOM DURING VISITING HOURS ONLY!  YOU NEED TO HAVE A COVID 19 TEST ON__11/26_____ @__8 :30_____,  THIS TEST MUST BE DONE BEFORE SURGERY,  COVID TESTING SITE [place] [address],  IT IS ON THE RIGHT GOING OUT [address] APPROXIMATELY  2 MINUTES PAST [employer] ON THE RIGHT. ONCE YOUR COVID TEST IS COMPLETED,   PLEASE BEGIN THE QUARANTINE INSTRUCTIONS AS OUTLINED IN YOUR HANDOUT.                Jamie Mccarthy   Your procedure is scheduled on: 12/21/19   Report to [facility] Main  Entrance   Report to Short Stay at 5:30 AM     Call this number if you have problems the morning of surgery [phone]   . BRUSH YOUR TEETH MORNING OF SURGERY AND RINSE YOUR MOUTH OUT, NO CHEWING GUM CANDY OR MINTS.      NO SOLID FOOD AFTER 6:00 PM THE NIGHT BEFORE YOUR SURGERY.   YOU MAY DRINK CLEAR FLUIDS until 4:30 AM  THE G2 DRINK YOU DRINK BEFORE YOU LEAVE HOME WILL BE THE LAST FLUIDS YOU [place].  PAIN IS EXPECTED AFTER SURGERY AND WILL NOT BE COMPLETELY ELIMINATED.   AMBULATION AND TYLENOL WILL HELP REDUCE INCISIONAL AND GAS PAIN. MOVEMENT IS KEY!  YOU ARE EXPECTED TO BE OUT OF BED WITHIN 4 HOURS OF ADMISSION TO YOUR PATIENT ROOM.  SITTING IN THE RECLINER THROUGHOUT THE DAY IS IMPORTANT FOR DRINKING FLUIDS AND MOVING GAS THROUGHOUT THE GI TRACT.  COMPRESSION STOCKINGS SHOULD BE WORN [place] UNLESS YOU ARE WALKING.   INCENTIVE SPIROMETER SHOULD BE USED EVERY HOUR WHILE AWAKE TO DECREASE POST-OPERATIVE COMPLICATIONS SUCH AS PNEUMONIA.  WHEN DISCHARGED HOME, IT IS IMPORTANT TO CONTINUE TO WALK EVERY HOUR AND USE THE INCENTIVE SPIROMETER EVERY HOUR.   Take these medicines the morning of surgery with A SIP OF WATER:  Zoloft    How to Manage Your Diabetes Before and After Surgery  Why is it important to control my blood sugar before and after surgery? . Improving blood sugar levels before and after surgery helps healing and can limit problems. . A way of improving blood sugar control is eating a healthy diet by: o  Eating less sugar and carbohydrates o  Increasing activity/exercise o  Talking with your doctor about reaching your blood sugar goals . High blood sugars (greater than 180 mg/dL) can raise your risk of infections and slow your recovery, so you will need to focus on controlling your diabetes during the weeks before surgery. . Make sure that the doctor who takes care of your diabetes knows about your planned surgery including the date and location.  How do I manage my blood sugar before surgery? . Check your blood sugar at least 4 times a day, starting 2 days before surgery, to make sure that the level is not too high or low. o Check your blood sugar the morning of your surgery when you wake up and every 2 hours until you get to the Short Stay unit. . If your blood sugar is less than 70 mg/dL, you will need to treat for low blood sugar: o Do not take insulin. o Treat a low blood sugar (  less than 70 mg/dL) with  cup of clear juice (cranberry or apple), 4 glucose tablets, OR glucose gel. o Recheck blood sugar in 15 minutes after treatment (to make sure it is greater than 70 mg/dL). If your blood sugar is not greater than 70 mg/dL on recheck, call [phone] for further instructions. . Report your blood sugar to the short stay nurse when you get to Short Stay.  . If you are admitted to the hospital after surgery: o Your blood sugar will be checked by the staff and you will probably be given insulin after surgery (instead of oral diabetes medicines) to make sure you have good blood sugar levels. o The goal for blood sugar control after surgery is 80-180 mg/dL.   WHAT DO I DO ABOUT MY DIABETES  MEDICATION?  Marland Kitchen Do not take oral diabetes medicines (pills) the morning of surgery.   . The day of surgery, do not take other diabetes injectables, including Byetta (exenatide), Bydureon (exenatide ER), Victoza (liraglutide), or Trulicity (dulaglutide).                            You may not have any metal on your body including hair pins and              piercings  Do not wear jewelry, make-up, lotions, powders or perfumes, deodorant             Do not wear nail polish on your fingernails.  Do not shave  48 hours prior to surgery.               Do not bring valuables to the hospital. [place].  Contacts, dentures or bridgework may not be worn into surgery.  .                Please read over the following fact sheets you were given: _____________________________________________________________________             [facility] - Preparing for Surgery Before surgery, you can play an important role.   Because skin is not sterile, your skin needs to be as free of germs as possible.   You can reduce the number of germs on your skin by washing with CHG (chlorahexidine gluconate) soap before surgery.   CHG is an antiseptic cleaner which kills germs and bonds with the skin to continue killing germs even after washing. Please DO NOT use if you have an allergy to CHG or antibacterial soaps.   If your skin becomes reddened/irritated stop using the CHG and inform your nurse when you arrive at Short Stay. Do not shave (including legs and underarms) for at least 48 hours prior to the first CHG shower.    Please follow these instructions carefully:  1.  Shower with CHG Soap the night before surgery and the  morning of Surgery.  2.  If you choose to wash your hair, wash your hair first as usual with your  normal  shampoo.  3.  After you shampoo, rinse your hair and body thoroughly to remove the  shampoo.                                        4.   Use CHG as you  would any other liquid soap.  You can apply chg directly  to the skin and wash                       Gently with a scrungie or clean washcloth.  5.  Apply the CHG Soap to your body ONLY FROM THE NECK DOWN.   Do not use on face/ open                           Wound or open sores. Avoid contact with eyes, ears mouth and genitals (private parts).                       Wash face,  Genitals (private parts) with your normal soap.             6.  Wash thoroughly, paying special attention to the area where your surgery  will be performed.  7.  Thoroughly rinse your body with warm water from the neck down.  8.  DO NOT shower/wash with your normal soap after using and rinsing off  the CHG Soap.             9.  Pat yourself dry with a clean towel.            10.  Wear clean pajamas.            11.  Place clean sheets on your bed the night of your first shower and do not  sleep with pets. Day of Surgery : Do not apply any lotions/deodorants the morning of surgery.  Please wear clean clothes to the hospital/surgery center.  FAILURE TO FOLLOW THESE INSTRUCTIONS MAY RESULT IN THE CANCELLATION OF YOUR SURGERY PATIENT SIGNATURE_________________________________  NURSE SIGNATURE__________________________________  ________________________________________________________________________   Adam Phenix  An incentive spirometer is a tool that can help keep your lungs clear and active. This tool measures how well you are filling your lungs with each breath. Taking long deep breaths may help reverse or decrease the chance of developing breathing (pulmonary) problems (especially infection) following:  A long period of time when you are unable to move or be active. BEFORE THE PROCEDURE   If the spirometer includes an indicator to show your best effort, your nurse or respiratory therapist will set it to a desired goal.  If possible, sit up straight or lean slightly forward. Try not to  slouch.  Hold the incentive spirometer in an upright position. INSTRUCTIONS FOR USE  1. Sit on the edge of your bed if possible, or sit up as far as you can in bed or on a chair. 2. Hold the incentive spirometer in an upright position. 3. Breathe out normally. 4. Place the mouthpiece in your mouth and seal your lips tightly around it. 5. Breathe in slowly and as deeply as possible, raising the piston or the ball toward the top of the column. 6. Hold your breath for 3-5 seconds or for as long as possible. Allow the piston or ball to fall to the bottom of the column. 7. Remove the mouthpiece from your mouth and breathe out normally. 8. Rest for a few seconds and repeat Steps 1 through 7 at least 10 times every 1-2 hours when you are awake. Take your time and take a few normal breaths between deep breaths. 9. The spirometer may include an indicator to show your best effort. Use the indicator as a goal  to work toward during each repetition. 10. After each set of 10 deep breaths, practice coughing to be sure your lungs are clear. If you have an incision (the cut made at the time of surgery), support your incision when coughing by placing a pillow or rolled up towels firmly against it. Once you are able to get out of bed, walk around indoors and cough well. You may stop using the incentive spirometer when instructed by your caregiver.  RISKS AND COMPLICATIONS  Take your time so you do not get dizzy or light-headed.  If you are in pain, you may need to take or ask for pain medication before doing incentive spirometry. It is harder to take a deep breath if you are having pain. AFTER USE  Rest and breathe slowly and easily.  It can be helpful to keep track of a log of your progress. Your caregiver can provide you with a simple table to help with this. If you are using the spirometer at home, follow these instructions: [place] IF:   You are having difficultly using the spirometer.  You  have trouble using the spirometer as often as instructed.  Your pain medication is not giving enough relief while using the spirometer.  You develop fever of 100.5 F (38.1 C) or higher. SEEK IMMEDIATE MEDICAL CARE IF:   You cough up bloody sputum that had not been present before.  You develop fever of 102 F (38.9 C) or greater.  You develop worsening pain at or near the incision site. MAKE SURE YOU:   Understand these instructions.  Will watch your condition.  Will get help right away if you are not doing well or get worse. Document Released: 05/21/2006 Document Revised: 04/02/2011 Document Reviewed: 07/22/2006 [facility] Patient Information 2014 [place], [place].   ________________________________________________________________________

## 2019-12-09 ENCOUNTER — Encounter (HOSPITAL_COMMUNITY)
Admission: RE | Admit: 2019-12-09 | Discharge: 2019-12-09 | Disposition: A | Discharge status: Home / Self Care | Payer: 59 | Source: Ambulatory Visit | Attending: General Surgery | Admitting: General Surgery

## 2019-12-09 ENCOUNTER — Encounter (HOSPITAL_COMMUNITY): Payer: Self-pay

## 2019-12-09 ENCOUNTER — Other Ambulatory Visit: Payer: Self-pay

## 2019-12-09 DIAGNOSIS — Z01812 Encounter for preprocedural laboratory examination: Secondary | ICD-10-CM | POA: Insufficient documentation

## 2019-12-09 HISTORY — DX: Raynaud's syndrome without gangrene: I73.00

## 2019-12-09 HISTORY — DX: Anxiety disorder, unspecified: F41.9

## 2019-12-09 HISTORY — DX: Depression, unspecified: F32.A

## 2019-12-09 HISTORY — DX: Myoneural disorder, unspecified: G70.9

## 2019-12-09 LAB — TYPE AND SCREEN
ABO/RH(D): O POS
Antibody Screen: NEGATIVE

## 2019-12-09 LAB — COMPREHENSIVE METABOLIC PANEL
ALT: 16 U/L (ref 0–44)
AST: 17 U/L (ref 15–41)
Albumin: 4.2 g/dL (ref 3.5–5.0)
Alkaline Phosphatase: 69 U/L (ref 38–126)
Anion gap: 7 (ref 5–15)
BUN: 12 mg/dL (ref 6–20)
CO2: 29 mmol/L (ref 22–32)
Calcium: 9.1 mg/dL (ref 8.9–10.3)
Chloride: 103 mmol/L (ref 98–111)
Creatinine, Ser: 0.75 mg/dL (ref 0.44–1.00)
GFR, Estimated: >60 mL/min (ref >60)
Glucose, Bld: 111 mg/dL — ABNORMAL HIGH (ref 70–99)
Potassium: 4.1 mmol/L (ref 3.5–5.1)
Sodium: 139 mmol/L (ref 135–145)
Total Bilirubin: 0.5 mg/dL (ref 0.3–1.2)
Total Protein: 7.2 g/dL (ref 6.5–8.1)

## 2019-12-09 LAB — CBC WITH DIFFERENTIAL/PLATELET
Abs Immature Granulocytes: 0.02 10*3/uL (ref 0.00–0.07)
Basophils Absolute: 0 10*3/uL (ref 0.0–0.1)
Basophils Relative: 0 %
Eosinophils Absolute: 0.2 10*3/uL (ref 0.0–0.5)
Eosinophils Relative: 2 %
HCT: 43.9 % (ref 36.0–46.0)
Hemoglobin: 14.1 g/dL (ref 12.0–15.0)
Immature Granulocytes: 0 %
Lymphocytes Relative: 35 %
Lymphs Abs: 2.9 10*3/uL (ref 0.7–4.0)
MCH: 25.5 pg — ABNORMAL LOW (ref 26.0–34.0)
MCHC: 32.1 g/dL (ref 30.0–36.0)
MCV: 79.2 fL — ABNORMAL LOW (ref 80.0–100.0)
Monocytes Absolute: 0.5 10*3/uL (ref 0.1–1.0)
Monocytes Relative: 7 %
Neutro Abs: 4.5 10*3/uL (ref 1.7–7.7)
Neutrophils Relative %: 56 %
Platelets: 359 10*3/uL (ref 150–400)
RBC: 5.54 MIL/uL — ABNORMAL HIGH (ref 3.87–5.11)
RDW: 15.2 % (ref 11.5–15.5)
WBC: 8.1 10*3/uL (ref 4.0–10.5)
nRBC: 0 % (ref 0.0–0.2)

## 2019-12-09 LAB — HEMOGLOBIN A1C
Hgb A1c MFr Bld: 6.1 % — ABNORMAL HIGH (ref 4.8–5.6)
Mean Plasma Glucose: 128.37 mg/dL

## 2019-12-09 LAB — GLUCOSE, CAPILLARY: Glucose-Capillary: 107 mg/dL — ABNORMAL HIGH (ref 70–99)

## 2019-12-09 NOTE — Progress Notes (Signed)
COVID Vaccine Completed:Yes Date COVID Vaccine completed:04/28/19 COVID vaccine manufacturer: [employer]     PCP - Dr. Peterson Ao Cardiologist - no  Chest x-ray - 2019- Epic EKG - 07/09/19- Epic Stress Test - no ECHO - no Cardiac Cath - no Pacemaker/ICD device last checked:NA  Sleep Study - no CPAP -   Fasting Blood Sugar - 100-160 Checks Blood Sugar _____ times a day once a week  Blood Thinner Instructions:NA Aspirin Instructions: Last Dose:  Anesthesia review:   Patient denies shortness of breath, fever, cough and chest pain at PAT appointment yes   Patient verbalized understanding of instructions that were given to them at the PAT appointment. Patient was also instructed that they will need to review over the PAT instructions again at home before surgery.Yes Pt has no SOB climbing stairs, doing housework or with ADLs Pt reports that she throws up when versed is pushed too fast in the IV.

## 2019-12-15 ENCOUNTER — Other Ambulatory Visit: Payer: Self-pay | Admitting: Nurse Practitioner

## 2019-12-15 DIAGNOSIS — B373 Candidiasis of vulva and vagina: Secondary | ICD-10-CM

## 2019-12-15 DIAGNOSIS — B3731 Acute candidiasis of vulva and vagina: Secondary | ICD-10-CM

## 2019-12-18 ENCOUNTER — Other Ambulatory Visit (HOSPITAL_COMMUNITY)
Admission: RE | Admit: 2019-12-18 | Discharge: 2019-12-18 | Disposition: A | Discharge status: Home / Self Care | Payer: 59 | Source: Ambulatory Visit | Attending: General Surgery | Admitting: General Surgery

## 2019-12-18 DIAGNOSIS — Z20822 Contact with and (suspected) exposure to covid-19: Secondary | ICD-10-CM | POA: Insufficient documentation

## 2019-12-18 DIAGNOSIS — Z01818 Encounter for other preprocedural examination: Secondary | ICD-10-CM | POA: Insufficient documentation

## 2019-12-18 LAB — SARS CORONAVIRUS 2 (TAT 6-24 HRS): SARS Coronavirus 2: NEGATIVE

## 2019-12-20 MED ORDER — BUPIVACAINE LIPOSOME 1.3 % IJ SUSP
20.0000 mL | Freq: Once | INTRAMUSCULAR | Status: DC
  Filled 2019-12-20: qty 20

## 2019-12-21 ENCOUNTER — Encounter (HOSPITAL_COMMUNITY)
Admission: RE | Disposition: A | Discharge status: Home / Self Care | Payer: Self-pay | Source: Home / Self Care | Attending: General Surgery

## 2019-12-21 ENCOUNTER — Other Ambulatory Visit: Payer: Self-pay

## 2019-12-21 ENCOUNTER — Inpatient Hospital Stay (HOSPITAL_COMMUNITY): Payer: 59 | Admitting: Certified Registered"

## 2019-12-21 ENCOUNTER — Inpatient Hospital Stay (HOSPITAL_COMMUNITY)
Admission: RE | Admit: 2019-12-21 | Discharge: 2019-12-22 | DRG: 327 | Disposition: A | Discharge status: Home / Self Care | Payer: 59 | Attending: General Surgery | Admitting: General Surgery

## 2019-12-21 DIAGNOSIS — K9509 Other complications of gastric band procedure: Secondary | ICD-10-CM | POA: Diagnosis not present

## 2019-12-21 DIAGNOSIS — G43909 Migraine, unspecified, not intractable, without status migrainosus: Secondary | ICD-10-CM | POA: Diagnosis not present

## 2019-12-21 DIAGNOSIS — Z6841 Body Mass Index (BMI) 40.0 and over, adult: Secondary | ICD-10-CM | POA: Diagnosis not present

## 2019-12-21 DIAGNOSIS — E118 Type 2 diabetes mellitus with unspecified complications: Secondary | ICD-10-CM | POA: Diagnosis present

## 2019-12-21 DIAGNOSIS — E119 Type 2 diabetes mellitus without complications: Secondary | ICD-10-CM | POA: Diagnosis present

## 2019-12-21 DIAGNOSIS — M1712 Unilateral primary osteoarthritis, left knee: Secondary | ICD-10-CM | POA: Diagnosis not present

## 2019-12-21 DIAGNOSIS — Z8249 Family history of ischemic heart disease and other diseases of the circulatory system: Secondary | ICD-10-CM | POA: Diagnosis not present

## 2019-12-21 DIAGNOSIS — R632 Polyphagia: Secondary | ICD-10-CM | POA: Diagnosis present

## 2019-12-21 DIAGNOSIS — Z20822 Contact with and (suspected) exposure to covid-19: Secondary | ICD-10-CM | POA: Diagnosis present

## 2019-12-21 DIAGNOSIS — G4730 Sleep apnea, unspecified: Secondary | ICD-10-CM | POA: Diagnosis not present

## 2019-12-21 DIAGNOSIS — K219 Gastro-esophageal reflux disease without esophagitis: Secondary | ICD-10-CM | POA: Diagnosis present

## 2019-12-21 DIAGNOSIS — Y732 Prosthetic and other implants, materials and accessory gastroenterology and urology devices associated with adverse incidents: Secondary | ICD-10-CM | POA: Diagnosis present

## 2019-12-21 DIAGNOSIS — Z7984 Long term (current) use of oral hypoglycemic drugs: Secondary | ICD-10-CM

## 2019-12-21 DIAGNOSIS — S83207A Unspecified tear of unspecified meniscus, current injury, left knee, initial encounter: Secondary | ICD-10-CM | POA: Diagnosis present

## 2019-12-21 DIAGNOSIS — Z823 Family history of stroke: Secondary | ICD-10-CM | POA: Diagnosis not present

## 2019-12-21 DIAGNOSIS — Z8639 Personal history of other endocrine, nutritional and metabolic disease: Secondary | ICD-10-CM | POA: Diagnosis present

## 2019-12-21 DIAGNOSIS — K660 Peritoneal adhesions (postprocedural) (postinfection): Secondary | ICD-10-CM | POA: Diagnosis not present

## 2019-12-21 DIAGNOSIS — E519 Thiamine deficiency, unspecified: Secondary | ICD-10-CM | POA: Diagnosis not present

## 2019-12-21 DIAGNOSIS — Z9884 Bariatric surgery status: Secondary | ICD-10-CM

## 2019-12-21 DIAGNOSIS — N926 Irregular menstruation, unspecified: Secondary | ICD-10-CM | POA: Diagnosis present

## 2019-12-21 HISTORY — PX: GASTRIC ROUX-EN-Y: SHX5262

## 2019-12-21 HISTORY — PX: UPPER GI ENDOSCOPY: SHX6162

## 2019-12-21 LAB — GLUCOSE, CAPILLARY
Glucose-Capillary: 105 mg/dL — ABNORMAL HIGH (ref 70–99)
Glucose-Capillary: 155 mg/dL — ABNORMAL HIGH (ref 70–99)
Glucose-Capillary: 164 mg/dL — ABNORMAL HIGH (ref 70–99)
Glucose-Capillary: 128 mg/dL — ABNORMAL HIGH (ref 70–99)
Glucose-Capillary: 117 mg/dL — ABNORMAL HIGH (ref 70–99)
Glucose-Capillary: 108 mg/dL — ABNORMAL HIGH (ref 70–99)

## 2019-12-21 LAB — HEMOGLOBIN AND HEMATOCRIT, BLOOD
HCT: 40.5 % (ref 36.0–46.0)
Hemoglobin: 13.4 g/dL (ref 12.0–15.0)

## 2019-12-21 LAB — PREGNANCY, URINE: Preg Test, Ur: NEGATIVE

## 2019-12-21 SURGERY — LAPAROSCOPIC ROUX-EN-Y GASTRIC BYPASS WITH UPPER ENDOSCOPY
Anesthesia: General | Site: Esophagus

## 2019-12-21 MED ORDER — FENTANYL CITRATE (PF) 100 MCG/2ML IJ SOLN
INTRAMUSCULAR | Status: AC
  Filled 2019-12-21: qty 2

## 2019-12-21 MED ORDER — INSULIN ASPART 100 UNIT/ML ~~LOC~~ SOLN
0.0000 [IU] | SUBCUTANEOUS | Status: DC
  Administered 2019-12-21: 4 [IU] via SUBCUTANEOUS
  Administered 2019-12-21 – 2019-12-22 (×2): 3 [IU] via SUBCUTANEOUS

## 2019-12-21 MED ORDER — ROCURONIUM BROMIDE 10 MG/ML (PF) SYRINGE
PREFILLED_SYRINGE | INTRAVENOUS | Status: DC | PRN
  Administered 2019-12-21 (×2): 10 mg via INTRAVENOUS
  Administered 2019-12-21: 20 mg via INTRAVENOUS
  Administered 2019-12-21: 100 mg via INTRAVENOUS

## 2019-12-21 MED ORDER — DEXAMETHASONE SODIUM PHOSPHATE 10 MG/ML IJ SOLN
INTRAMUSCULAR | Status: AC
  Filled 2019-12-21: qty 1

## 2019-12-21 MED ORDER — SUGAMMADEX SODIUM 500 MG/5ML IV SOLN
INTRAVENOUS | Status: AC
  Filled 2019-12-21: qty 5

## 2019-12-21 MED ORDER — HYDRALAZINE HCL 20 MG/ML IJ SOLN: 10.0000 mg | INTRAMUSCULAR | Status: DC | PRN

## 2019-12-21 MED ORDER — ACETAMINOPHEN 500 MG PO TABS
1000.0000 mg | ORAL_TABLET | ORAL | Status: AC
  Administered 2019-12-21: 1000 mg via ORAL
  Filled 2019-12-21: qty 2

## 2019-12-21 MED ORDER — ONDANSETRON HCL 4 MG/2ML IJ SOLN
INTRAMUSCULAR | Status: AC
  Filled 2019-12-21: qty 2

## 2019-12-21 MED ORDER — SUGAMMADEX SODIUM 200 MG/2ML IV SOLN
INTRAVENOUS | Status: DC | PRN
  Administered 2019-12-21: 250 mg via INTRAVENOUS

## 2019-12-21 MED ORDER — ORAL CARE MOUTH RINSE: 15.0000 mL | Freq: Once | OROMUCOSAL | Status: AC

## 2019-12-21 MED ORDER — APREPITANT 40 MG PO CAPS
40.0000 mg | ORAL_CAPSULE | ORAL | Status: AC
  Administered 2019-12-21: 40 mg via ORAL
  Filled 2019-12-21: qty 1

## 2019-12-21 MED ORDER — EPHEDRINE SULFATE-NACL 50-0.9 MG/10ML-% IV SOSY
PREFILLED_SYRINGE | INTRAVENOUS | Status: DC | PRN
  Administered 2019-12-21 (×2): 5 mg via INTRAVENOUS

## 2019-12-21 MED ORDER — "VISTASEAL 4 ML SINGLE DOSE KIT "
4.0000 mL | PACK | Freq: Once | CUTANEOUS | Status: AC
  Administered 2019-12-21: 4 mL via TOPICAL
  Filled 2019-12-21: qty 4

## 2019-12-21 MED ORDER — MIDAZOLAM HCL 2 MG/2ML IJ SOLN
INTRAMUSCULAR | Status: DC | PRN
  Administered 2019-12-21 (×2): 1 mg via INTRAVENOUS

## 2019-12-21 MED ORDER — ACETAMINOPHEN 500 MG PO TABS
1000.0000 mg | ORAL_TABLET | Freq: Three times a day (TID) | ORAL | Status: DC
  Administered 2019-12-22: 1000 mg via ORAL
  Filled 2019-12-21: qty 2

## 2019-12-21 MED ORDER — LORATADINE 10 MG PO TABS
10.0000 mg | ORAL_TABLET | Freq: Every day | ORAL | Status: DC
  Administered 2019-12-22: 10 mg via ORAL
  Filled 2019-12-21: qty 1

## 2019-12-21 MED ORDER — LACTATED RINGERS IR SOLN
Status: DC | PRN
  Administered 2019-12-21: 1000 mL

## 2019-12-21 MED ORDER — STERILE WATER FOR IRRIGATION IR SOLN
Status: DC | PRN
  Administered 2019-12-21: 1000 mL

## 2019-12-21 MED ORDER — PROPOFOL 10 MG/ML IV BOLUS
INTRAVENOUS | Status: DC | PRN
  Administered 2019-12-21: 50 mg via INTRAVENOUS
  Administered 2019-12-21: 150 mg via INTRAVENOUS

## 2019-12-21 MED ORDER — SODIUM CHLORIDE (PF) 0.9 % IJ SOLN
INTRAMUSCULAR | Status: AC
  Filled 2019-12-21: qty 50

## 2019-12-21 MED ORDER — FIBRIN SEALANT 2 ML SINGLE DOSE KIT
2.0000 mL | PACK | Freq: Once | CUTANEOUS | Status: AC
  Administered 2019-12-21: 2 mL via TOPICAL
  Filled 2019-12-21: qty 2

## 2019-12-21 MED ORDER — PANTOPRAZOLE SODIUM 40 MG IV SOLR
40.0000 mg | Freq: Every day | INTRAVENOUS | Status: DC
  Administered 2019-12-21: 40 mg via INTRAVENOUS
  Filled 2019-12-21: qty 40

## 2019-12-21 MED ORDER — MIDAZOLAM HCL 2 MG/2ML IJ SOLN
INTRAMUSCULAR | Status: AC
  Filled 2019-12-21: qty 2

## 2019-12-21 MED ORDER — HEPARIN SODIUM (PORCINE) 5000 UNIT/ML IJ SOLN
5000.0000 [IU] | INTRAMUSCULAR | Status: AC
  Administered 2019-12-21: 5000 [IU] via SUBCUTANEOUS
  Filled 2019-12-21: qty 1

## 2019-12-21 MED ORDER — SUMATRIPTAN SUCCINATE 50 MG PO TABS
100.0000 mg | ORAL_TABLET | ORAL | Status: DC | PRN
  Filled 2019-12-21: qty 2

## 2019-12-21 MED ORDER — SIMETHICONE 80 MG PO CHEW
80.0000 mg | CHEWABLE_TABLET | Freq: Four times a day (QID) | ORAL | Status: DC | PRN
  Administered 2019-12-22: 80 mg via ORAL
  Filled 2019-12-21: qty 1

## 2019-12-21 MED ORDER — KETAMINE HCL 10 MG/ML IJ SOLN
INTRAMUSCULAR | Status: AC
  Filled 2019-12-21: qty 1

## 2019-12-21 MED ORDER — GABAPENTIN 300 MG PO CAPS
300.0000 mg | ORAL_CAPSULE | ORAL | Status: AC
  Administered 2019-12-21: 300 mg via ORAL
  Filled 2019-12-21: qty 1

## 2019-12-21 MED ORDER — SODIUM CHLORIDE (PF) 0.9 % IJ SOLN
INTRAMUSCULAR | Status: DC | PRN
  Administered 2019-12-21: 50 mL

## 2019-12-21 MED ORDER — ENOXAPARIN SODIUM 30 MG/0.3ML ~~LOC~~ SOLN
30.0000 mg | Freq: Two times a day (BID) | SUBCUTANEOUS | Status: DC
  Administered 2019-12-22: 30 mg via SUBCUTANEOUS
  Filled 2019-12-21 (×3): qty 0.3

## 2019-12-21 MED ORDER — MEPERIDINE HCL 50 MG/ML IJ SOLN: 6.2500 mg | INTRAMUSCULAR | Status: DC | PRN

## 2019-12-21 MED ORDER — ACETAMINOPHEN 160 MG/5ML PO SOLN
1000.0000 mg | Freq: Three times a day (TID) | ORAL | Status: DC
  Administered 2019-12-21 (×2): 1000 mg via ORAL
  Filled 2019-12-21 (×3): qty 40.6

## 2019-12-21 MED ORDER — FENTANYL CITRATE (PF) 250 MCG/5ML IJ SOLN
INTRAMUSCULAR | Status: DC | PRN
  Administered 2019-12-21: 100 ug via INTRAVENOUS
  Administered 2019-12-21: 50 ug via INTRAVENOUS
  Administered 2019-12-21 (×3): 25 ug via INTRAVENOUS
  Administered 2019-12-21: 50 ug via INTRAVENOUS
  Administered 2019-12-21: 25 ug via INTRAVENOUS

## 2019-12-21 MED ORDER — MORPHINE SULFATE (PF) 2 MG/ML IV SOLN
1.0000 mg | INTRAVENOUS | Status: DC | PRN
  Administered 2019-12-22: 1 mg via INTRAVENOUS
  Filled 2019-12-21: qty 1

## 2019-12-21 MED ORDER — BUPIVACAINE LIPOSOME 1.3 % IJ SUSP
INTRAMUSCULAR | Status: DC | PRN
  Administered 2019-12-21: 20 mL

## 2019-12-21 MED ORDER — OXYCODONE HCL 5 MG/5ML PO SOLN
5.0000 mg | Freq: Four times a day (QID) | ORAL | Status: DC | PRN
  Administered 2019-12-21 – 2019-12-22 (×4): 5 mg via ORAL
  Filled 2019-12-21 (×4): qty 5

## 2019-12-21 MED ORDER — HYDROMORPHONE HCL 1 MG/ML IJ SOLN
INTRAMUSCULAR | Status: AC
  Filled 2019-12-21: qty 2

## 2019-12-21 MED ORDER — CHLORHEXIDINE GLUCONATE 4 % EX LIQD: 60.0000 mL | Freq: Once | CUTANEOUS | Status: DC

## 2019-12-21 MED ORDER — PROPOFOL 10 MG/ML IV BOLUS
INTRAVENOUS | Status: AC
  Filled 2019-12-21: qty 40

## 2019-12-21 MED ORDER — SCOPOLAMINE 1 MG/3DAYS TD PT72
1.0000 | MEDICATED_PATCH | TRANSDERMAL | Status: DC
  Administered 2019-12-21: 1.5 mg via TRANSDERMAL
  Filled 2019-12-21: qty 1

## 2019-12-21 MED ORDER — DEXAMETHASONE SODIUM PHOSPHATE 4 MG/ML IJ SOLN
4.0000 mg | INTRAMUSCULAR | Status: AC
  Administered 2019-12-21: 4 mg via INTRAVENOUS

## 2019-12-21 MED ORDER — ONDANSETRON HCL 4 MG/2ML IJ SOLN
INTRAMUSCULAR | Status: DC | PRN
  Administered 2019-12-21: 4 mg via INTRAVENOUS

## 2019-12-21 MED ORDER — ROCURONIUM BROMIDE 10 MG/ML (PF) SYRINGE
PREFILLED_SYRINGE | INTRAVENOUS | Status: AC
  Filled 2019-12-21: qty 10

## 2019-12-21 MED ORDER — LIDOCAINE 20MG/ML (2%) 15 ML SYRINGE OPTIME
INTRAMUSCULAR | Status: DC | PRN
  Administered 2019-12-21: 1.5 mg/kg/h via INTRAVENOUS

## 2019-12-21 MED ORDER — POTASSIUM CHLORIDE IN NACL 20-0.9 MEQ/L-% IV SOLN
INTRAVENOUS | Status: DC
  Filled 2019-12-21 (×3): qty 1000

## 2019-12-21 MED ORDER — SODIUM CHLORIDE 0.9 % IV SOLN
2.0000 g | INTRAVENOUS | Status: AC
  Administered 2019-12-21: 2 g via INTRAVENOUS
  Filled 2019-12-21: qty 2

## 2019-12-21 MED ORDER — HYDROMORPHONE HCL 1 MG/ML IJ SOLN
0.2500 mg | INTRAMUSCULAR | Status: DC | PRN
  Administered 2019-12-21 (×4): 0.5 mg via INTRAVENOUS

## 2019-12-21 MED ORDER — LACTATED RINGERS IV SOLN: INTRAVENOUS | Status: DC

## 2019-12-21 MED ORDER — CHLORHEXIDINE GLUCONATE 0.12 % MT SOLN
15.0000 mL | Freq: Once | OROMUCOSAL | Status: AC
  Administered 2019-12-21: 15 mL via OROMUCOSAL

## 2019-12-21 MED ORDER — ENSURE MAX PROTEIN PO LIQD
2.0000 [oz_av] | ORAL | Status: DC
  Administered 2019-12-22: 2 [oz_av] via ORAL

## 2019-12-21 MED ORDER — GABAPENTIN 100 MG PO CAPS
200.0000 mg | ORAL_CAPSULE | Freq: Two times a day (BID) | ORAL | Status: DC
  Administered 2019-12-21 – 2019-12-22 (×3): 200 mg via ORAL
  Filled 2019-12-21 (×3): qty 2

## 2019-12-21 MED ORDER — 0.9 % SODIUM CHLORIDE (POUR BTL) OPTIME
TOPICAL | Status: DC | PRN
  Administered 2019-12-21: 1000 mL

## 2019-12-21 MED ORDER — PROMETHAZINE HCL 25 MG/ML IJ SOLN: 12.5000 mg | Freq: Four times a day (QID) | INTRAMUSCULAR | Status: DC | PRN

## 2019-12-21 MED ORDER — LIDOCAINE 2% (20 MG/ML) 5 ML SYRINGE
INTRAMUSCULAR | Status: DC | PRN
  Administered 2019-12-21: 60 mg via INTRAVENOUS

## 2019-12-21 MED ORDER — ONDANSETRON HCL 4 MG/2ML IJ SOLN: 4.0000 mg | Freq: Once | INTRAMUSCULAR | Status: DC | PRN

## 2019-12-21 MED ORDER — ONDANSETRON HCL 4 MG/2ML IJ SOLN
4.0000 mg | Freq: Four times a day (QID) | INTRAMUSCULAR | Status: DC | PRN
  Administered 2019-12-21 – 2019-12-22 (×2): 4 mg via INTRAVENOUS
  Filled 2019-12-21 (×2): qty 2

## 2019-12-21 SURGICAL SUPPLY — 89 items
APPLICATOR COTTON TIP 6 STRL (MISCELLANEOUS) IMPLANT
APPLICATOR COTTON TIP 6IN STRL (MISCELLANEOUS)
APPLICATOR VISTASEAL 35 (MISCELLANEOUS) ×3 IMPLANT
APPLIER CLIP ROT 13.4 12 LRG (CLIP)
BENZOIN TINCTURE PRP APPL 2/3 (GAUZE/BANDAGES/DRESSINGS) ×3 IMPLANT
BLADE SURG 15 STRL LF DISP TIS (BLADE) ×2 IMPLANT
BLADE SURG 15 STRL SS (BLADE) ×3
BLADE SURG SZ11 CARB STEEL (BLADE) ×3 IMPLANT
BNDG ADH 1X3 SHEER STRL LF (GAUZE/BANDAGES/DRESSINGS) ×18 IMPLANT
CABLE HIGH FREQUENCY MONO STRZ (ELECTRODE) IMPLANT
CHLORAPREP W/TINT 26 (MISCELLANEOUS) ×6 IMPLANT
CLIP APPLIE ROT 13.4 12 LRG (CLIP) IMPLANT
CLIP SUT LAPRA TY ABSORB (SUTURE) ×6 IMPLANT
COVER WAND RF STERILE (DRAPES) IMPLANT
CUTTER FLEX LINEAR 45M (STAPLE) IMPLANT
DECANTER SPIKE VIAL GLASS SM (MISCELLANEOUS) IMPLANT
DERMABOND ADVANCED (GAUZE/BANDAGES/DRESSINGS)
DERMABOND ADVANCED .7 DNX12 (GAUZE/BANDAGES/DRESSINGS) IMPLANT
DEVICE SUT QUICK LOAD TK 5 (STAPLE) IMPLANT
DEVICE SUT TI-KNOT TK 5X26 (MISCELLANEOUS) IMPLANT
DEVICE SUTURE ENDOST 10MM (ENDOMECHANICALS) ×3 IMPLANT
DISSECTOR BLUNT TIP ENDO 5MM (MISCELLANEOUS) IMPLANT
DRAIN PENROSE 0.25X18 (DRAIN) ×3 IMPLANT
DRAPE UTILITY XL STRL (DRAPES) ×3 IMPLANT
DRSG TEGADERM 2-3/8X2-3/4 SM (GAUZE/BANDAGES/DRESSINGS) ×3 IMPLANT
ELECT REM PT RETURN 15FT ADLT (MISCELLANEOUS) ×3 IMPLANT
GAUZE 4X4 16PLY RFD (DISPOSABLE) ×3 IMPLANT
GAUZE SPONGE 2X2 8PLY STRL LF (GAUZE/BANDAGES/DRESSINGS) ×2 IMPLANT
GAUZE SPONGE 4X4 12PLY STRL (GAUZE/BANDAGES/DRESSINGS) IMPLANT
GLOVE BIO SURGEON STRL SZ7.5 (GLOVE) ×3 IMPLANT
GLOVE INDICATOR 8.0 STRL GRN (GLOVE) ×3 IMPLANT
GOWN STRL REUS W/TWL LRG LVL3 (GOWN DISPOSABLE) ×3 IMPLANT
GOWN STRL REUS W/TWL XL LVL3 (GOWN DISPOSABLE) ×12 IMPLANT
GRASPER SUT TROCAR 14GX15 (MISCELLANEOUS) IMPLANT
KIT BASIN OR (CUSTOM PROCEDURE TRAY) ×3 IMPLANT
KIT GASTRIC LAVAGE 34FR ADT (SET/KITS/TRAYS/PACK) ×3 IMPLANT
KIT TURNOVER KIT A (KITS) IMPLANT
MARKER SKIN DUAL TIP RULER LAB (MISCELLANEOUS) ×3 IMPLANT
MAT PREVALON FULL STRYKER (MISCELLANEOUS) ×3 IMPLANT
NEEDLE SPNL 22GX3.5 QUINCKE BK (NEEDLE) ×3 IMPLANT
NS IRRIG 1000ML POUR BTL (IV SOLUTION) ×3 IMPLANT
PACK CARDIOVASCULAR III (CUSTOM PROCEDURE TRAY) ×3 IMPLANT
PENCIL SMOKE EVACUATOR (MISCELLANEOUS) IMPLANT
RELOAD 45 VASCULAR/THIN (ENDOMECHANICALS) IMPLANT
RELOAD ENDO STITCH 2.0 (ENDOMECHANICALS) ×27
RELOAD STAPLE TA45 3.5 REG BLU (ENDOMECHANICALS) IMPLANT
RELOAD STAPLER BLUE 60MM (STAPLE) ×4 IMPLANT
RELOAD STAPLER GOLD 60MM (STAPLE) ×2 IMPLANT
RELOAD STAPLER GREEN 60MM (STAPLE) ×2 IMPLANT
RELOAD STAPLER WHITE 60MM (STAPLE) ×4 IMPLANT
SCISSORS LAP 5X45 EPIX DISP (ENDOMECHANICALS) ×3 IMPLANT
SET IRRIG TUBING LAPAROSCOPIC (IRRIGATION / IRRIGATOR) ×3 IMPLANT
SET TUBE SMOKE EVAC HIGH FLOW (TUBING) ×3 IMPLANT
SHEARS HARMONIC ACE PLUS 45CM (MISCELLANEOUS) ×3 IMPLANT
SLEEVE XCEL OPT CAN 5 100 (ENDOMECHANICALS) ×9 IMPLANT
SOL ANTI FOG 6CC (MISCELLANEOUS) ×2 IMPLANT
SOLUTION ANTI FOG 6CC (MISCELLANEOUS) ×1
SPONGE GAUZE 2X2 STER 10/PKG (GAUZE/BANDAGES/DRESSINGS) ×1
SPONGE LAP 18X18 RF (DISPOSABLE) ×3 IMPLANT
STAPLER ECHELON BIOABSB 60 FLE (MISCELLANEOUS) IMPLANT
STAPLER ECHELON LONG 60 440 (INSTRUMENTS) ×3 IMPLANT
STAPLER RELOAD BLUE 60MM (STAPLE) ×6
STAPLER RELOAD GOLD 60MM (STAPLE) ×3
STAPLER RELOAD GREEN 60MM (STAPLE) ×3
STAPLER RELOAD WHITE 60MM (STAPLE) ×6
STRIP CLOSURE SKIN 1/2X4 (GAUZE/BANDAGES/DRESSINGS) ×3 IMPLANT
SURGILUBE 2OZ TUBE FLIPTOP (MISCELLANEOUS) ×3 IMPLANT
SUT MNCRL AB 4-0 PS2 18 (SUTURE) ×3 IMPLANT
SUT RELOAD ENDO STITCH 2 48X1 (ENDOMECHANICALS) ×10
SUT RELOAD ENDO STITCH 2.0 (ENDOMECHANICALS) ×8
SUT SILK 0 (SUTURE) ×3
SUT SILK 0 30XBRD TIE 6 (SUTURE) ×2 IMPLANT
SUT SURGIDAC NAB ES-9 0 48 120 (SUTURE) IMPLANT
SUT VIC AB 2-0 SH 27 (SUTURE) ×6
SUT VIC AB 2-0 SH 27X BRD (SUTURE) ×4 IMPLANT
SUT VICRYL 0 TIES 12 18 (SUTURE) IMPLANT
SUT VICRYL 0 UR6 27IN ABS (SUTURE) IMPLANT
SUTURE RELOAD END STTCH 2 48X1 (ENDOMECHANICALS) ×10 IMPLANT
SUTURE RELOAD ENDO STITCH 2.0 (ENDOMECHANICALS) ×8 IMPLANT
SYR 20ML LL LF (SYRINGE) ×6 IMPLANT
TOWEL OR 17X26 10 PK STRL BLUE (TOWEL DISPOSABLE) ×3 IMPLANT
TOWEL OR NON WOVEN STRL DISP B (DISPOSABLE) ×3 IMPLANT
TRAY FOLEY MTR SLVR 16FR STAT (SET/KITS/TRAYS/PACK) IMPLANT
TROCAR BLADELESS 15MM (ENDOMECHANICALS) IMPLANT
TROCAR BLADELESS OPT 5 100 (ENDOMECHANICALS) ×3 IMPLANT
TROCAR UNIVERSAL OPT 12M 100M (ENDOMECHANICALS) ×9 IMPLANT
TROCAR XCEL 12X100 BLDLESS (ENDOMECHANICALS) ×3 IMPLANT
TROCAR XCEL BLUNT TIP 100MML (ENDOMECHANICALS) IMPLANT
TUBING CONNECTING 10 (TUBING) ×6 IMPLANT

## 2019-12-21 NOTE — Progress Notes (Signed)
Discussed post op day goals with patient including ambulation, IS, diet progression, pain, and nausea control.  BSTOP education provided including BSTOP information guide, "Guide for Pain Management after your Bariatric Procedure".  Questions answered. 

## 2019-12-21 NOTE — Progress Notes (Signed)
PHARMACY CONSULT FOR:  Risk Assessment for Post-Discharge VTE Following Bariatric Surgery  Post-Discharge VTE Risk Assessment: This patient's probability of 30-day post-discharge VTE is increased due to the factors marked:   Female    Age >/=60 years    BMI >/=50 kg/m2    CHF    Dyspnea at Rest    Paraplegia  X  Non-gastric-band surgery  X  Operation Time >/=3 hr    Return to OR     Length of Stay >/= 3 d      Hx of VTE   Hypercoagulable condition   Significant venous stasis   Predicted probability of 30-day post-discharge VTE: 0.25%  Other patient-specific factors to consider: none  Recommendation for Discharge: . No pharmacologic prophylaxis post-discharge . Follow daily and recalculate estimated 30d VTE risk if returns to OR or LOS reaches 3 days.  Jamie Mccarthy is a 44 y.o. female who underwent conversion of gastric band to laparoscopic Roux-en-Y gastric bypass on 12/21/19   Case start: 0801 Case end: 1118   Allergies  Allergen Reactions  . Latex Itching    Pt states she gets severely itchy with latex  . Naproxen Rash    Patient Measurements: Height: 5' 4"  (162.6 cm) Weight: 117.4 kg (258 lb 12.8 oz) IBW/kg (Calculated) : 54.7 Body mass index is 44.42 kg/m.  Recent Labs    12/21/19 1200  HGB 13.4  HCT 40.5   Estimated Creatinine Clearance: 115.4 mL/min (by C-G formula based on SCr of 0.75 mg/dL).    Past Medical History:  Diagnosis Date  . Anemia    Has sickle cell trait  . Anxiety   . Depression   . GERD (gastroesophageal reflux disease)    diet controlled  . Gestational diabetes   . H/O vaginal delivery 2010  . Headache(784.0)    Migraines  . Herpes 2007   hx of  . Iron deficiency anemia, unspecified 10/20/2012  . Neuromuscular disorder ([place])   . Obese   . PONV (postoperative nausea and vomiting)    has had PONV with previous c/s and also lap band surgery 2004  . Raynaud's disease   . Seasonal allergies      Medications Prior to  Admission  Medication Sig Dispense Refill Last Dose  . Accu-Chek FastClix Lancets MISC Apply topically.   Past Week at Unknown time  . B Complex-C (B-COMPLEX WITH VITAMIN C) tablet Take 1 tablet by mouth daily.   Past Week at Unknown time  . blood glucose meter kit and supplies KIT Dispense based on patient and insurance preference. Use up to twice times daily as directed. (FOR ICD-9 250.00, 250.01). 1 each PRN Past Week at Unknown time  . cetirizine (ZYRTEC) 10 MG tablet Take 1 tablet (10 mg total) by mouth daily. (Patient taking differently: Take 10 mg by mouth 2 (two) times daily. ) 30 tablet 11 Past Week at Unknown time  . Dapagliflozin-metFORMIN HCl ER 5-500 MG TB24 Take 1 tablet by mouth in the morning. 30 tablet 1 12/20/2019 at Unknown time  . Erenumab-aooe (AIMOVIG) 70 MG/ML SOAJ Inject 70 mg into the skin every 30 (thirty) days. 1.12 mL 1 12/20/2019 at Unknown time  . Ferrous Sulfate (IRON PO) Take 1 tablet by mouth in the morning and at bedtime.   12/20/2019 at Unknown time  . fluconazole (DIFLUCAN) 200 MG tablet TAKE 1 TABLET EVERY 3 DAYS FOR 3 DOSES ( 6 DAY SPAN) THEN TAKE 1 TABLET BY MOUTH ONCE A WEEK. 6 tablet  3 Past Week at Unknown time  . Glucose Blood (BLOOD GLUCOSE TEST STRIPS) STRP Contour Next test strips, patient preference. For testing blood sugars twice daily. Dx: E11.65 200 strip 6 Past Week at Unknown time  . Insulin Pen Needle (BD PEN NEEDLE NANO U/F) 32G X 4 MM MISC USE TO INJECT INTO SKIN DAILY AS DIRECTED 100 each 2 Past Week at Unknown time  . ketoconazole (NIZORAL) 2 % shampoo Apply to scalp twice a week for 8 weeks and then weekly thereafter (Patient taking differently: Apply 1 application topically 2 (two) times a week. ) 120 mL 11 Past Week at Unknown time  . Lancets Misc. KIT Contour Next lancets, patient preference. For testing blood sugars up to twice daily. Dx:E11.65 1 kit 0 Past Week at Unknown time  . Nystatin POWD Apply liberally to affected skin twice a day.  (Patient taking differently: Apply 1 application topically 2 (two) times daily as needed (rash). ) 1 each 2 Past Week at Unknown time  . Probiotic Product (PROBIOTIC PO) Take 1 tablet by mouth daily.   Past Week at Unknown time  . Semaglutide, 1 MG/DOSE, (OZEMPIC, 1 MG/DOSE,) 2 MG/1.5ML SOPN Inject 1 mg into the skin once a week. 6 mL 3 Past Week at Unknown time  . sertraline (ZOLOFT) 50 MG tablet Take 1 tablet (50 mg total) by mouth daily. 30 tablet 3 12/21/2019 at 0500  . SUMAtriptan (IMITREX) 100 MG tablet TAKE 1 TABLET BY MOUTH EVERY 2HRS AS NEEDED FOR MIGRAINE. (MAY REPEAT IN 2HRS IF HEADACHE PERSIST) (Patient taking differently: Take 100 mg by mouth every 2 (two) hours as needed for migraine. TAKE 1 TABLET BY MOUTH EVERY 2HRS AS NEEDED FOR MIGRAINE. (MAY REPEAT IN 2HRS IF HEADACHE PERSIST)) 10 tablet 2 Past Month at Unknown time  . tretinoin microspheres (RETIN-A MICRO) 0.04 % gel APPLY TO AFFECTED AREA EVERY DAY AT BEDTIME (Patient taking differently: Apply 1 application topically at bedtime. ) 45 g 0 Past Week at Unknown time  . levocetirizine (XYZAL) 5 MG tablet TAKE 1 TABLET BY MOUTH EVERY DAY IN THE EVENING (Patient not taking: Reported on 12/07/2019) 90 tablet 0 Not Taking at Unknown time  . Topiramate ER (TROKENDI XR) 50 MG CP24 Take 1 capsule by mouth daily. (Patient not taking: Reported on 12/07/2019) 30 capsule 11 Not Taking at Unknown time      Reuel Boom, PharmD, BCPS [phone] 12/21/2019, 1:29 PM

## 2019-12-21 NOTE — Anesthesia Procedure Notes (Signed)
Procedure Name: Intubation Date/Time: 12/21/2019 7:45 AM Performed by: Eben Burow, CRNA Pre-anesthesia Checklist: Patient identified, Emergency Drugs available, Suction available, Patient being monitored and Timeout performed Patient Re-evaluated:Patient Re-evaluated prior to induction Oxygen Delivery Method: Circle system utilized Preoxygenation: Pre-oxygenation with 100% oxygen Induction Type: IV induction Ventilation: Mask ventilation without difficulty Laryngoscope Size: Mac and 4 Grade View: Grade I Tube type: Oral Tube size: 7.0 mm Number of attempts: 1 Airway Equipment and Method: Stylet Placement Confirmation: ETT inserted through vocal cords under direct vision,  positive ETCO2 and breath sounds checked- equal and bilateral Secured at: 21 cm Tube secured with: Tape Dental Injury: Teeth and Oropharynx as per pre-operative assessment

## 2019-12-21 NOTE — Anesthesia Postprocedure Evaluation (Signed)
Anesthesia Post Note  Patient: Jamie Mccarthy  Procedure(s) Performed: LAPAROSCOPIC ROUX-EN-Y GASTRIC BYPASS (N/A Abdomen) UPPER GI ENDOSCOPY (N/A Esophagus) LAPAROSCOPIC REMOVAL OF GASTRIC BAND AND COMPONENTS (N/A Abdomen)     Patient location during evaluation: PACU Anesthesia Type: General Level of consciousness: awake and alert Pain management: pain level controlled Vital Signs Assessment: post-procedure vital signs reviewed and stable Respiratory status: spontaneous breathing, nonlabored ventilation, respiratory function stable and patient connected to nasal cannula oxygen Cardiovascular status: blood pressure returned to baseline and stable Postop Assessment: no apparent nausea or vomiting Anesthetic complications: no   No complications documented.  Last Vitals:  Vitals:   12/21/19 1521 12/21/19 1521  BP: (!) 141/100 (!) 141/100  Pulse: 73 73  Resp: 18 18  Temp:    SpO2: 99% 99%    Last Pain:  Vitals:   12/21/19 1340  TempSrc:   PainSc: [place]

## 2019-12-21 NOTE — Discharge Instructions (Signed)
GASTRIC BYPASS/SLEEVE  Home Care Instructions   These instructions are to help you care for yourself when you go home.  Call: If you have any problems. . Call [phone] and ask for the surgeon on call . If you need immediate help, come to the ER at [facility]  . Tell the ER staff that you are a new post-op gastric bypass or gastric sleeve patient   Signs and symptoms to report: . Severe vomiting or nausea o If you cannot keep down clear liquids for longer than 1 day, call your surgeon  . Abdominal pain that does not get better after taking your pain medication . Fever over 100.4 F with chills . Heart beating over 100 beats a minute . Shortness of breath at rest . Chest pain .  Redness, swelling, drainage, or foul odor at incision (surgical) sites .  If your incisions open or pull apart . Swelling or pain in calf (lower leg) . Diarrhea (Loose bowel movements that happen often), frequent watery, uncontrolled bowel movements . Constipation, (no bowel movements for 3 days) if this happens: Pick one o Milk of Magnesia, 2 tablespoons by mouth, 3 times a day for 2 days if needed o Stop taking Milk of Magnesia once you have a bowel movement o Call your doctor if constipation continues Or o Miralax  (instead of Milk of Magnesia) following the label instructions o Stop taking Miralax once you have a bowel movement o Call your doctor if constipation continues . Anything you think is not normal   Normal side effects after surgery: . Unable to sleep at night or unable to focus . Irritability or moody . Being tearful (crying) or depressed These are common complaints, possibly related to your anesthesia medications that put you to sleep, stress of surgery, and change in lifestyle.  This usually goes away a few weeks after surgery.  If these feelings continue, call your primary care doctor.   Wound Care: You may have surgical glue, steri-strips, or staples over your incisions after  surgery . Surgical glue:  Looks like a clear film over your incisions and will wear off a little at a time . Steri-strips: Strips of tape over your incisions. You may notice a yellowish color on the skin under the steri-strips. This is used to make the   steri-strips stick better. Do not pull the steri-strips off - let them fall off . Staples: Jodell Cipro may be removed before you leave the hospital o If you go home with staples, call [facility], [phone]) [phone] at for an appointment with your surgeon's nurse to have staples removed 10 days after surgery. . Showering: You may shower two (2) days after your surgery unless your surgeon tells you differently o Wash gently around incisions with warm soapy water, rinse well, and gently pat dry  o No tub baths until staples are removed, steri-strips fall off or glue is gone.    Medications: Marland Kitchen Medications should be liquid or crushed if larger than the size of a dime . Extended release pills (medication that release a little bit at a time through the day) should NOT be crushed or cut. (examples include XL, ER, DR, SR) . Depending on the size and number of medications you take, you may need to space (take a few throughout the day)/change the time you take your medications so that you do not over-fill your pouch (smaller stomach) . Make sure you follow-up with your primary care doctor to  make medication changes needed during rapid weight loss and life-style changes . If you have diabetes, follow up with the doctor that orders your diabetes medication(s) within one week after surgery and check your blood sugar regularly. . Do not drive while taking prescription pain medication  . It is ok to take Tylenol by the bottle instructions with your pain medicine or instead of your pain medicine as needed.  DO NOT TAKE NSAIDS (EXAMPLES OF NSAIDS:  IBUPROFREN/ NAPROXEN)  Diet:                    First 2 Weeks  You will see the dietician t about two (2) weeks  after your surgery. The dietician will increase the types of foods you can eat if you are handling liquids well: Marland Kitchen If you have severe vomiting or nausea and cannot keep down clear liquids lasting longer than 1 day, call your surgeon @ [phone]) Protein Shake . Drink at least 2 ounces of shake 5-6 times per day . Each serving of protein shakes (usually 8 - 12 ounces) should have: o 15 grams of protein  o And no more than 5 grams of carbohydrate  . Goal for protein each day: o Men = 80 grams per day o Women = 60 grams per day . Protein powder may be added to fluids such as non-fat milk or Lactaid milk or unsweetened Soy/Almond milk (limit to 35 grams added protein powder per serving)  Hydration . Slowly increase the amount of water and other clear liquids as tolerated (See Acceptable Fluids) . Slowly increase the amount of protein shake as tolerated  .  Sip fluids slowly and throughout the day.  Do not use straws. . May use sugar substitutes in small amounts (no more than 6 - 8 packets per day; i.e. Splenda)  Fluid Goal . The first goal is to drink at least 8 ounces of protein shake/drink per day (or as directed by the nutritionist); some examples of protein shakes are [employer], [employer], EAS Edge HP, and [place]. See handout from pre-op Bariatric Education Class: o Slowly increase the amount of protein shake you drink as tolerated o You may find it easier to slowly sip shakes throughout the day o It is important to get your proteins in first . Your fluid goal is to drink 64 - 100 ounces of fluid daily o It may take a few weeks to build up to this . 32 oz (or more) should be clear liquids  And  . 32 oz (or more) should be full liquids (see below for examples) . Liquids should not contain sugar, caffeine, or carbonation  Clear Liquids: . Water or Sugar-free flavored water (i.e. Fruit H2O, Propel) . Decaffeinated coffee or tea (sugar-free) . [employer], [employer],  Minute [facility] . Sugar-free Jell-O . Bouillon or broth . Sugar-free Popsicle:   *Less than 20 calories each; Limit 1 per day  Full Liquids: Protein Shakes/Drinks + 2 choices per day of other full liquids . Full liquids must be: o No More Than 15 grams of Carbs per serving  o No More Than 3 grams of Fat per serving . Strained low-fat cream soup (except Cream of Potato or Tomato) . Non-Fat milk . Fat-free Lactaid Milk . Unsweetened Soy Or Unsweetened Almond Milk . Low Sugar yogurt (Dannon Lite & Fit, [place] yogurt; Oikos Triple Zero; Chobani Simply 100; Yoplait 100 calorie [place] - No Fruit on the Bottom)    Vitamins  and Minerals . Start 1 day after surgery unless otherwise directed by your surgeon . Chewable Bariatric Specific Multivitamin / Multimineral Supplement with iron (Example: Bariatric Advantage Multi EA) . Chewable Calcium with Vitamin D-3 (Example: 3 Chewable Calcium Plus 600 with Vitamin D-3) o Take 500 mg three (3) times a day for a total of 1500 mg each day o Do not take all 3 doses of calcium at one time as it may cause constipation, and you can only absorb 500 mg  at a time  o Do not mix multivitamins containing iron with calcium supplements; take 2 hours apart . Menstruating women and those with a history of anemia (a blood disease that causes weakness) may need extra iron o Talk with your doctor to see if you need more iron . Do not stop taking or change any vitamins or minerals until you talk to your dietitian or surgeon . Your Dietitian and/or surgeon must approve all vitamin and mineral supplements   Activity and Exercise: Limit your physical activity as instructed by your doctor.  It is important to continue walking at home.  During this time, use these guidelines: . Do not lift anything greater than ten (10) pounds for at least two (2) weeks . Do not go back to work or drive until Engineer, production says you can . You may have sex when you feel comfortable  o It is  VERY important for female patients to use a reliable birth control method; fertility often increases after surgery  o All hormonal birth control will be ineffective for 30 days after surgery due to medications given during surgery a barrier method must be used. o Do not get pregnant for at least 18 months . Start exercising as soon as your doctor tells you that you can o Make sure your doctor approves any physical activity . Start with a simple walking program . Walk 5-15 minutes each day, 7 days per week.  . Slowly increase until you are walking 30-45 minutes per day Consider joining our [place] program. [phone] or email [email or web address]   Special Instructions Things to remember: . Use your CPAP when sleeping if this applies to you  . [facility] has two free Bariatric Surgery Support Groups that meet monthly o The 3rd Thursday of each month, 6 pm  . It is very important to keep all follow up appointments with your surgeon, dietitian, primary care physician, and behavioral health practitioner . Routine follow up schedule with your surgeon include appointments at 2-3 weeks, 6-8 weeks, 6 months, and 1 year at a minimum.  Your surgeon may request to see you more often.   . After the first year, please follow up with your bariatric surgeon and dietitian at least once a year in order to maintain best weight loss results   [facility]: [place]: [phone] Bariatric Nurse Coordinator: [phone]      Reviewed and Endorsed  by [facility] Patient Education Committee, June, 2016 Edits Approved: Aug, 2018

## 2019-12-21 NOTE — Transfer of Care (Signed)
Immediate Anesthesia Transfer of Care Note  Patient: Jamie Mccarthy  Procedure(s) Performed: LAPAROSCOPIC ROUX-EN-Y GASTRIC BYPASS (N/A Abdomen) UPPER GI ENDOSCOPY (N/A Esophagus) LAPAROSCOPIC REMOVAL OF GASTRIC BAND AND COMPONENTS (N/A Abdomen)  Patient Location: PACU  Anesthesia Type:General  Level of Consciousness: awake, alert  and patient cooperative  Airway & Oxygen Therapy: Patient Spontanous Breathing and Patient connected to face mask oxygen  Post-op Assessment: Report given to RN and Post -op Vital signs reviewed and stable  Post vital signs: Reviewed and stable  Last Vitals:  Vitals Value Taken Time  BP 143/105 12/21/19 1126  Temp    Pulse 88 12/21/19 1127  Resp 16 12/21/19 1127  SpO2 100 % 12/21/19 1127  Vitals shown include unvalidated device data.  Last Pain:  Vitals:   12/21/19 0700  TempSrc: Oral         Complications: No complications documented.

## 2019-12-21 NOTE — Op Note (Signed)
Preoperative diagnosis: Removal of lap-band and roux-en-Y gastric bypass  Postoperative diagnosis: Same   Procedure: Upper endoscopy   Surgeon: Clovis Riley, M.D.  Anesthesia: Gen.   Description of procedure: The endoscope was placed in the mouth and oropharynx and under endoscopic vision it was advanced to the esophagogastric junction which was identified at 40cm from the teeth.  The pouch was tensely insufflated while the upper abdomen was flooded with irrigation and the roux limb ocluded by Dr. Redmond Pulling to perform a leak test, which was negative. No bubbles were seen.  The staple line and anastomosis was hemostatic and anastomosis was patent and measured at 45cm from the teeth. The lumen was decompressed and the scope was withdrawn without difficulty.    Clovis Riley, M.D. General, Bariatric, & Minimally Invasive Surgery [facility]

## 2019-12-21 NOTE — Interval H&P Note (Signed)
History and Physical Interval Note:  12/21/2019 7:22 AM  Jamie Mccarthy  has presented today for surgery, with the diagnosis of MORBID OBESITY.  The various methods of treatment have been discussed with the patient and family. After consideration of risks, benefits and other options for treatment, the patient has consented to  Procedure(s): LAPAROSCOPIC ROUX-EN-Y GASTRIC BYPASS WITH UPPER ENDOSCOPY (N/A) UPPER GI ENDOSCOPY (N/A) LAPAROSCOPIC REMOVAL OF GASTRIC BAND AND COMPONENTS (N/A) as a surgical intervention.  The patient's history has been reviewed, patient examined, no change in status, stable for surgery.  I have reviewed the patient's chart and labs.  Questions were answered to the patient's satisfaction.     [name]

## 2019-12-21 NOTE — Anesthesia Preprocedure Evaluation (Signed)
Anesthesia Evaluation  Patient identified by MRN, date of birth, ID band Patient awake    Reviewed: Allergy & Precautions, NPO status , Patient's Chart, lab work & pertinent test results  History of Anesthesia Complications (+) PONV  Airway Mallampati: II  TM Distance: >3 FB Neck ROM: Full    Dental   Pulmonary    Pulmonary exam normal        Cardiovascular Normal cardiovascular exam     Neuro/Psych Anxiety Depression    GI/Hepatic   Endo/Other  diabetes, Type 2, Oral Hypoglycemic Agents  Renal/GU      Musculoskeletal   Abdominal   Peds  Hematology   Anesthesia Other Findings   Reproductive/Obstetrics                             Anesthesia Physical Anesthesia Plan  ASA: III  Anesthesia Plan: General   Post-op Pain Management:    Induction: Intravenous  PONV Risk Score and Plan: 4 or greater and Treatment may vary due to age or medical condition, Midazolam, Ondansetron and Scopolamine patch - Pre-op  Airway Management Planned: Oral ETT  Additional Equipment:   Intra-op Plan:   Post-operative Plan: Extubation in OR  Informed Consent: I have reviewed the patients History and Physical, chart, labs and discussed the procedure including the risks, benefits and alternatives for the proposed anesthesia with the patient or authorized representative who has indicated his/her understanding and acceptance.       Plan Discussed with: CRNA and Surgeon  Anesthesia Plan Comments:         Anesthesia Quick Evaluation

## 2019-12-21 NOTE — Op Note (Signed)
Jamie Mccarthy [id number] 07/15/1975. 12/21/2019  Preoperative diagnosis:   Severe obesity BMI 44   Catamenial migraine   GERD   Primary osteoarthritis of left knee   Controlled diabetes mellitus type 2 with complications ([place]) H/o Lap adjustable gastric banding at [facility] 2004   Postoperative  diagnosis:  1. same  Surgical procedure: Laparoscopic removal of adjustable gastric band and components with conversion to Laparoscopic Roux-en-Y gastric bypass (ante-colic, ante-gastric); upper endoscopy  Surgeon: Gayland Curry, M.D. FACS  Asst.: Romana Juniper MD FACS  Anesthesia: General plus exparel/marcaine mix  Complications: None   EBL: Minimal   Drains: None   Disposition: PACU in good condition   Indications for procedure: 44 y.o. yo female with morbid obesity who has been unsuccessful at sustained weight loss.  She previously underwent laparoscopic adjustable gastric band placement in 2004 at [facility].  She transferred her care to our program around 2014.  Unfortunately we have had ongoing problems with her adjustable gastric band.  We have had a challenging time getting her into the "green zone".  She has also had progressive GERD.  There is also concerns for a small slip as well.  She desired removal and conversion.  We had previously discussed risk and benefits which are separately documented.  The patient's comorbidities are listed above. We discussed the risk and benefits of surgery including but not limited to anesthesia risk, bleeding, infection, blood clot formation, anastomotic leak, anastomotic stricture, ulcer formation, death, respiratory complications, intestinal blockage, internal hernia, gallstone formation, vitamin and nutritional deficiencies, injury to surrounding structures, failure to lose weight and mood changes.   Description of procedure: Patient is brought to the operating room and general anesthesia induced. The patient had received preoperative  broad-spectrum IV antibiotics and subcutaneous heparin.  Enhanced recovery protocol use.  the abdomen was widely sterilely prepped with Chloraprep and draped. Patient timeout was performed and correct patient and procedure confirmed. Access was obtained with a 12 mm Optiview trocar in the left upper quadrant and pneumoperitoneum established without difficulty. Under direct vision 12 mm trocars were placed laterally in the right upper quadrant, right upper quadrant midclavicular line, and to the left and above the umbilicus for the camera port-using as many old incisions as possible.  A 5 mm trocar was placed laterally in the left upper quadrant.  Exparel/marcaine mix was infiltrated in bilateral lateral abdominal walls as a TAP block.   Through a 5 mm subxiphoid site the [facility] retractor was placed and the left lobe of the liver elevated with excellent exposure of the upper stomach and hiatus.  There were adhesions of omentum to the left lobe of the liver which were taken down with harmonic scalpel.  We identified the band.  There is the typical capsule along the anterior surface of the band and along the tubing.  Some of the tubing was adhered to the undersurface of the left lobe of the liver.  This was separated with a combination of hook electrocautery as well as harmonic scalpel.  We then incised the capsule on top of the band.  This revealed the buckle of the band.  I continued to take down the anterior capsule extending toward the patient's left upper quadrant.  This was taken all the way to the first plication suture.  At this point I stopped.  The band was cut.  The band was then pulled back and removed from around the stomach and placed in the right upper quadrant to be extracted later.  We then spent about the next 15 to 20 minutes taking down the gastric plication sutures that had been placed during her original band placement.  This was done with a combination of blunt dissection with Prestige  graspers, laparoscopic Kitner's, EndoShears and Harmonic scalpel.  We were eventually able to take down and restore the stomach to its normal configuration.  There were no plication sutures left intact.  We took down a total of 2 plication sutures.  I did not see the third.  There was no evidence of injury to the stomach.  I released some of the the anterior capsule of where the band had been sitting on top of the anterior wall of the stomach.  The angle of Hiss was then mobilized with the harmonic scalpel. A 5 cm gastric pouch was then carefully measured along the lesser curve of the stomach.  This is about 2 cm below the location where the band had been wrapped around the stomach. Dissection was carried along the lesser curve at this point with the Harmonic scalpel working carefully back toward the lesser sac at right angles to the lesser curve. The free lesser sac was then entered. After being sure all tubes were removed from the stomach an initial firing of the green load 60 mm linear stapler was fired at right angles across the lesser curve for about 4 cm. The gastric pouch was further mobilized posteriorly and then the pouch was completed with 61firing of a 60 mm blue load linear & and then gold 60 cartridge due to some thickness and scarring from her prior bariatric surgery.  There was still about 2 mm of intact stomach left however staples had completely closed across the entire remaining portion of the intact stomach but the knife did not transected so therefore I used a blue 60 to completely free the last 2 mm of the stomach.   It was ensured that the pouch was completely mobilized away from the gastric remnant. This created a nice tubular 5 cm gastric pouch.  I did place some Vistaseal along the last 2 staple lines of the gastric pouch.   At this point we went to the lower abdomen to perform the jejunojejunostomy.  There was a little bit of omentum stuck to the anterior abdominal wall around the  umbilicus.  This was reduced.  This revealed a very small oblique fascial defect.  It was only about 3 mm wide and probably about 1 cm vertical.    The omentum was brought into the upper abdomen and the transverse mesocolon elevated and the ligament of Treitz clearly identified. A 40 cm biliopancreatic limb was then carefully measured from the ligament of Treitz. The small intestine was divided at this point with a single firing of the white load linear stapler. A Penrose drain was sutured to the end of the Roux-en-Y limb for later identification. A 100 cm Roux-en-Y limb was then carefully measured. At this point a side-to-side anastomosis was created between the Roux limb and the end of the biliopancreatic limb. This was accomplished with a single firing of the 60 mm white load linear stapler. The common enterotomy was closed with a running 2-0 Vicryl begun at either end of the enterotomy and tied centrally. vistaseal tissue sealant was placed over the anastomosis. The mesenteric defect was then closed with running 2-0 silk. The omentum was then divided with the harmonic scalpel up towards the transverse colon to allow mobility of the Roux limb toward the gastric pouch.  The patient was then placed in steep reversed Trendelenburg.     The Roux limb was then brought up in an antecolic fashion with the candycane facing to the patient's left without undue tension. The gastrojejunostomy was created with an initial posterior row of 2-0 Vicryl between the Roux limb and the staple line of the gastric pouch. Enterotomies were then made in the gastric pouch and the Roux limb with the harmonic scalpel and at approximately 3 cm anastomosis was created with a single firing of the 60mm blue load linear stapler. The staple line was inspected and was intact without bleeding. The common enterotomy was then closed with running 2-0 Vicryl begun at either end and tied centrally. The Ewall tube was then easily passed through the  anastomosis and an outer anterior layer of running 2-0 Vicryl was placed. The Ewald tube was removed. With the outlet of the gastrojejunostomy clamped and under saline irrigation the assistant performed upper endoscopy and with the gastric pouch tensely distended with air-there was no evidence of leak on this test. The pouch was desufflated. The Terance Hart defect was closed with running 2-0 silk. The abdomen was inspected for any evidence of bleeding or bowel injury and everything looked fine. The Nathanson retractor was removed under direct vision after coating the anastomosis with vistaseal tissue sealant.  At this point we change the mid right abdominal 12 mm trocar to a 15 mm trocar.  The band was removed.  I then removed some additional band tubing after cutting it just below the fascial surface.  We then turned our attention to removing the port.  The old incision in the right upper quadrant where her port was was incised with 11 blade.  Soft tissue was divided electrocautery.  The capsule around the port was incised electrocautery.  We able to circumferentially free the port and the attached 1 cm of mesh in the remaining band tubing from the soft tissue.  There was no evidence of a fascial defect at this location.  No remaining capsule left in this area.  We reinsufflated the abdomen.  The 15 Miller trocar was removed and the fascial defect was closed with the 0 Vicryl with a PMI suture passer.  Additional Exparel was infiltrated in this location.  We inspected the small fascial defect in the mid abdomen just around the umbilicus.  It had an oblique entry to it.  Given its shape and configuration we did not feel it was at risk for getting bowel trapped within it.   All CO2 was evacuated and trochars removed.  The incision where the port was removed was closed in 2 layers deep layer with interrupted inverted 2-0 Vicryl and then skin with 4-0 Monocryl. All meaning skin incisions were closed with 4-0 monocryl  in a subcuticular fashion followed by steri-strips and Tegaderm. Sponge needle and instrument counts were correct. The patient was taken to the PACU in good condition.    Leighton Ruff. Redmond Pulling, MD, FACS General, Bariatric, & Minimally Invasive Surgery [facility] Surgery, [place]

## 2019-12-22 ENCOUNTER — Ambulatory Visit: Payer: 59 | Admitting: Family Medicine

## 2019-12-22 ENCOUNTER — Other Ambulatory Visit (HOSPITAL_COMMUNITY): Payer: Self-pay | Admitting: General Surgery

## 2019-12-22 ENCOUNTER — Encounter (HOSPITAL_COMMUNITY): Payer: Self-pay | Admitting: General Surgery

## 2019-12-22 LAB — CBC WITH DIFFERENTIAL/PLATELET
Abs Immature Granulocytes: 0.06 10*3/uL (ref 0.00–0.07)
Basophils Absolute: 0 10*3/uL (ref 0.0–0.1)
Basophils Relative: 0 %
Eosinophils Absolute: 0.1 10*3/uL (ref 0.0–0.5)
Eosinophils Relative: 1 %
HCT: 37.4 % (ref 36.0–46.0)
Hemoglobin: 12.3 g/dL (ref 12.0–15.0)
Immature Granulocytes: 1 %
Lymphocytes Relative: 16 %
Lymphs Abs: 2 10*3/uL (ref 0.7–4.0)
MCH: 25.9 pg — ABNORMAL LOW (ref 26.0–34.0)
MCHC: 32.9 g/dL (ref 30.0–36.0)
MCV: 78.7 fL — ABNORMAL LOW (ref 80.0–100.0)
Monocytes Absolute: 0.9 10*3/uL (ref 0.1–1.0)
Monocytes Relative: 7 %
Neutro Abs: 9.8 10*3/uL — ABNORMAL HIGH (ref 1.7–7.7)
Neutrophils Relative %: 75 %
Platelets: 278 10*3/uL (ref 150–400)
RBC: 4.75 MIL/uL (ref 3.87–5.11)
RDW: 14.8 % (ref 11.5–15.5)
WBC: 12.9 10*3/uL — ABNORMAL HIGH (ref 4.0–10.5)
nRBC: 0 % (ref 0.0–0.2)

## 2019-12-22 LAB — COMPREHENSIVE METABOLIC PANEL
ALT: 68 U/L — ABNORMAL HIGH (ref 0–44)
AST: 60 U/L — ABNORMAL HIGH (ref 15–41)
Albumin: 3.4 g/dL — ABNORMAL LOW (ref 3.5–5.0)
Alkaline Phosphatase: 52 U/L (ref 38–126)
Anion gap: 7 (ref 5–15)
BUN: 6 mg/dL (ref 6–20)
CO2: 24 mmol/L (ref 22–32)
Calcium: 8.4 mg/dL — ABNORMAL LOW (ref 8.9–10.3)
Chloride: 107 mmol/L (ref 98–111)
Creatinine, Ser: 0.68 mg/dL (ref 0.44–1.00)
GFR, Estimated: >60 mL/min (ref >60)
Glucose, Bld: 119 mg/dL — ABNORMAL HIGH (ref 70–99)
Potassium: 3.9 mmol/L (ref 3.5–5.1)
Sodium: 138 mmol/L (ref 135–145)
Total Bilirubin: 0.6 mg/dL (ref 0.3–1.2)
Total Protein: 6.3 g/dL — ABNORMAL LOW (ref 6.5–8.1)

## 2019-12-22 LAB — GLUCOSE, CAPILLARY
Glucose-Capillary: 112 mg/dL — ABNORMAL HIGH (ref 70–99)
Glucose-Capillary: 105 mg/dL — ABNORMAL HIGH (ref 70–99)
Glucose-Capillary: 124 mg/dL — ABNORMAL HIGH (ref 70–99)

## 2019-12-22 MED ORDER — ACETAMINOPHEN 500 MG PO TABS: 1000.0000 mg | ORAL_TABLET | Freq: Three times a day (TID) | ORAL | 0 refills | Status: AC

## 2019-12-22 MED ORDER — ONDANSETRON 4 MG PO TBDP: 4.0000 mg | ORAL_TABLET | Freq: Four times a day (QID) | ORAL | 0 refills | Status: DC | PRN

## 2019-12-22 MED ORDER — TRAMADOL HCL 50 MG PO TABS: 50.0000 mg | ORAL_TABLET | Freq: Four times a day (QID) | ORAL | 0 refills | Status: DC | PRN

## 2019-12-22 MED ORDER — PANTOPRAZOLE SODIUM 40 MG PO TBEC: 40.0000 mg | DELAYED_RELEASE_TABLET | Freq: Every day | ORAL | 0 refills | Status: DC

## 2019-12-22 MED ORDER — GABAPENTIN 100 MG PO CAPS: 200.0000 mg | ORAL_CAPSULE | Freq: Two times a day (BID) | ORAL | 0 refills | Status: DC

## 2019-12-22 MED FILL — ONDANSETRON ODT 4 MG TABLET: 4 | 5 days supply | Qty: 20 | Fill #0

## 2019-12-22 MED FILL — GABAPENTIN 100 MG CAPSULE: 100 | 5 days supply | Qty: 20 | Fill #0

## 2019-12-22 MED FILL — PANTOPRAZOLE SOD DR 40 MG T: 40 | 30 days supply | Qty: 30 | Fill #0

## 2019-12-22 MED FILL — traMADol HCL 50 MG TABS: 50 | 2 days supply | Qty: 10 | Fill #0

## 2019-12-22 NOTE — Progress Notes (Signed)
NUTRITION NOTE  Consult received for Bariatric diet teaching. Patient is POD #1 lap roux-en-y gastric bypass and diet has been advance to bariatric CLD.   Bariatric Nurse Educator saw patient yesterday and was able/will be able to review all needed post-op instructions, including post-op diet instructions.  No additional nutrition-related needs at this time. If needs arise, please re-consult RD.      Jarome Matin, MS, RD, LDN, CNSC Inpatient Clinical Dietitian RD pager # available in [place]  After hours/weekend pager # available in [facility]

## 2019-12-22 NOTE — Progress Notes (Signed)
Pt alert and oriented. Reported feeling a little nauseous but still wanted to go. D/C instructions given by Parks Neptune, RN and pt d/cd home.

## 2019-12-22 NOTE — Progress Notes (Signed)
1 Day Post-Op   Subjective/Chief Complaint: Doing well.  Pain ok No morphine Tolerated water. Likes the chicken soup shake bc it soothes throat No fever. No tachycardia   Objective: Vital signs in last 24 hours: Temp:  [97.5 F (36.4 C)-98.7 F (37.1 C)] 98.7 F (37.1 C) (11/30 0555) Pulse Rate:  [71-94] 79 (11/30 0555) Resp:  [13-19] 18 (11/30 0555) BP: (131-158)/(70-105) 137/88 (11/30 0555) SpO2:  [96 %-100 %] 96 % (11/30 0555) Last BM Date: 12/20/19  Intake/Output from previous day: 11/29 0701 - 11/30 0700 In: 4546.2 [P.O.:720; I.V.:3726.2; IV Piggyback:100] Out: 1219 [Urine:3400; Blood:20] Intake/Output this shift: Total I/O In: 60 [P.O.:60] Out: 0   Alert, nontoxic symm chest rise, nonlabored Reg Soft, approp TTP, dressings ok. nd No edema. +scds  Lab Results:  Recent Labs    12/21/19 1200 12/22/19 0609  WBC  --  12.9*  HGB 13.4 12.3  HCT 40.5 37.4  PLT  --  278   BMET Recent Labs    12/22/19 0609  NA 138  K 3.9  CL 107  CO2 24  GLUCOSE 119*  BUN 6  CREATININE 0.68  CALCIUM 8.4*   PT/INR No results for input(s): LABPROT, INR in the last 72 hours. ABG No results for input(s): PHART, HCO3 in the last 72 hours.  Invalid input(s): PCO2, PO2  Studies/Results: No results found.  Anti-infectives: Anti-infectives (From admission, onward)   Start     Dose/Rate Route Frequency Ordered Stop   12/21/19 0600  cefoTEtan (CEFOTAN) 2 g in sodium chloride 0.9 % 100 mL IVPB        2 g 200 mL/hr over 30 Minutes Intravenous On call to O.R. 12/21/19 0530 12/21/19 0746      Assessment/Plan: s/p Procedure(s): LAPAROSCOPIC ROUX-EN-Y GASTRIC BYPASS (N/A) UPPER GI ENDOSCOPY (N/A) LAPAROSCOPIC REMOVAL OF GASTRIC BAND AND COMPONENTS (N/A)  Small bump in wbc but no fever, no tachycardia, not ill appearing Cont oral diet Cont subcu lovenox Discussed surgery Start dc teaching Probable dc later today  Leighton Ruff. Redmond Pulling, MD, FACS General, Bariatric, &  Minimally Invasive Surgery [facility] Surgery, [place]   LOS: 1 day    [name] 12/22/2019

## 2019-12-22 NOTE — Progress Notes (Signed)
Patient alert and oriented, pain is controlled. Patient is tolerating fluids, advanced to protein shake today, patient is tolerating well. Reviewed Gastric Bypass discharge instructions with patient and patient is able to articulate understanding. Provided information on [facility]T program, Support Group and [facility] outpatient pharmacy. All questions answered, will continue to monitor.    

## 2019-12-22 NOTE — Progress Notes (Signed)
Patient alert and oriented, Post op day 1.  Provided support and encouragement.  Encouraged pulmonary toilet, ambulation and small sips of liquids.  Completed 12 ounces of bari clear fluid and 6 ounces protein overnight.  Started Chicken Soup Unjury this am.  All questions answered.  Will continue to monitor.

## 2019-12-24 NOTE — Discharge Summary (Signed)
Physician Discharge Summary  Raelea VELVA MOLINARI ZNB:[id number] DOB: 06-28-1975 DOA: 12/21/2019  PCP: Hali Marry, MD  Admit date: 12/21/2019 Discharge date: 12/22/2019  Recommendations for Outpatient Follow-up:     Follow-up Information    [name], MD. Go on 01/06/2020.   Specialty: General Surgery Why: at 930 am.  Please arrive 15 minutes prior to appointment.  Thank you Contact information: [address] [place] [address] [phone]        Surgery, [place]. Go on 02/16/2020.   Specialty: General Surgery Why: at 215 pm.  Please arrive 15 minutes prior to appointment.  Thank you Contact information: [place] [address] [phone]              Discharge Diagnoses:  Principal Problem:   Severe obesity (BMI >= 40) (HCC) Active Problems:   Catamenial migraine   GERD   Primary osteoarthritis of left knee   Controlled diabetes mellitus type 2 with complications ([place])   S/P gastric bypass   Surgical Procedure: laparoscopic removal of adjustable gastric band with conversion to Laparoscopic Roux-en-Y gastric bypass, upper endoscopy  Discharge Condition: Good Disposition: Home  Diet recommendation: Postoperative gastric bypass diet  Filed Weights   12/21/19 0700  Weight: 117.4 kg     Hospital Course:  The patient was admitted for a planned laparoscopic removal of lap band with conversion to laparoscopic Roux-en-Y gastric bypass. Please see operative note. Preoperatively the patient was given 5000 units of subcutaneous heparin for DVT prophylaxis. ERAS protocol was used. Postoperative prophylactic Lovenox dosing was started on the evening of postoperative day 0.  The patient was started on ice chips and water on the evening of POD 0 which they tolerated. On postoperative day 1 The patient's diet was advanced to protein shakes which they also tolerated. On POD 1, The patient was ambulating without  difficulty. Their vital signs are stable without fever or tachycardia. Their hemoglobin had remained stable.. The patient had received discharge instructions and counseling. They were deemed stable for discharge.  BP (!) 146/96 (BP Location: Left Wrist)    Pulse 81    Temp 98.2 F (36.8 C) (Oral)    Resp 14    Ht 5' 4"  (1.626 m)    Wt 117.4 kg    LMP 08/29/2015 (Approximate)    SpO2 97%    BMI 44.42 kg/m   Gen: alert, NAD, non-toxic appearing Pupils: equal, no scleral icterus Pulm: Lungs clear to auscultation, symmetric chest rise CV: regular rate and rhythm Abd: soft, min tender, nondistended. No cellulitis. No incisional hernia Ext: no edema, no calf tenderness Skin: no rash, no jaundice  Discharge Instructions  Discharge Instructions    Ambulate hourly while awake   Complete by: As directed    Call MD for:  difficulty breathing, headache or visual disturbances   Complete by: As directed    Call MD for:  persistant dizziness or light-headedness   Complete by: As directed    Call MD for:  persistant nausea and vomiting   Complete by: As directed    Call MD for:  redness, tenderness, or signs of infection (pain, swelling, redness, odor or green/yellow discharge around incision site)   Complete by: As directed    Call MD for:  severe uncontrolled pain   Complete by: As directed    Call MD for:  temperature >101 F   Complete by: As directed    Diet bariatric full liquid  Complete by: As directed    Discharge instructions   Complete by: As directed    See bariatric discharge instructions   Incentive spirometry   Complete by: As directed    Perform hourly while awake     Allergies as of 12/22/2019      Reactions   Latex Itching   Pt states she gets severely itchy with latex   Naproxen Rash      Medication List    STOP taking these medications   cetirizine 10 MG tablet Commonly known as: ZYRTEC   Dapagliflozin-metFORMIN HCl ER 5-500 MG Tb24   fluconazole 200 MG  tablet Commonly known as: DIFLUCAN   ketoconazole 2 % shampoo Commonly known as: NIZORAL   levocetirizine 5 MG tablet Commonly known as: XYZAL   Nystatin Powd   Ozempic (1 MG/DOSE) 2 MG/1.5ML Sopn Generic drug: Semaglutide (1 MG/DOSE)   tretinoin microspheres 0.04 % gel Commonly known as: RETIN-A MICRO   Trokendi XR 50 MG Cp24 Generic drug: Topiramate ER     TAKE these medications   Accu-Chek FastClix Lancets Misc Apply topically.   acetaminophen 500 MG tablet Commonly known as: TYLENOL Take 2 tablets (1,000 mg total) by mouth every 8 (eight) hours for 5 days.   Aimovig 70 MG/ML Soaj Generic drug: Erenumab-aooe Inject 70 mg into the skin every 30 (thirty) days.   B-complex with vitamin C tablet Take 1 tablet by mouth daily.   BD Pen Needle Nano U/F 32G X 4 MM Misc Generic drug: Insulin Pen Needle USE TO INJECT INTO SKIN DAILY AS DIRECTED   blood glucose meter kit and supplies Kit Dispense based on patient and insurance preference. Use up to twice times daily as directed. (FOR ICD-9 250.00, 250.01).   BLOOD GLUCOSE TEST STRIPS Strp Contour Next test strips, patient preference. For testing blood sugars twice daily. Dx: E11.65   gabapentin 100 MG capsule Commonly known as: NEURONTIN Take 2 capsules (200 mg total) by mouth every 12 (twelve) hours.   IRON PO Take 1 tablet by mouth in the morning and at bedtime.   Lancets Misc. Kit Contour Next lancets, patient preference. For testing blood sugars up to twice daily. Dx:E11.65   ondansetron 4 MG disintegrating tablet Commonly known as: ZOFRAN-ODT Take 1 tablet (4 mg total) by mouth every 6 (six) hours as needed for nausea or vomiting.   pantoprazole 40 MG tablet Commonly known as: PROTONIX Take 1 tablet (40 mg total) by mouth daily.   PROBIOTIC PO Take 1 tablet by mouth daily.   sertraline 50 MG tablet Commonly known as: ZOLOFT Take 1 tablet (50 mg total) by mouth daily.   SUMAtriptan 100 MG  tablet Commonly known as: IMITREX TAKE 1 TABLET BY MOUTH EVERY 2HRS AS NEEDED FOR MIGRAINE. (MAY REPEAT IN 2HRS IF HEADACHE PERSIST) What changed: See the new instructions.   traMADol 50 MG tablet Commonly known as: ULTRAM Take 1 tablet (50 mg total) by mouth every 6 (six) hours as needed (pain).       Follow-up Information    [name], MD. Go on 01/06/2020.   Specialty: General Surgery Why: at 930 am.  Please arrive 15 minutes prior to appointment.  Thank you Contact information: [address] [place] [address] [phone]        Surgery, [place]. Go on 02/16/2020.   Specialty: General Surgery Why: at 215 pm.  Please arrive 15 minutes prior to appointment.  Thank you Contact information: [place] [address] [phone]                The results of significant diagnostics from this hospitalization (including imaging, microbiology, ancillary and laboratory) are listed below for reference.    Significant Diagnostic Studies: No results found.  Labs: Basic Metabolic Panel: Recent Labs  Lab 12/22/19 0609  NA 138  K 3.9  CL 107  CO2 24  GLUCOSE 119*  BUN 6  CREATININE 0.68  CALCIUM 8.4*   Liver Function Tests: Recent Labs  Lab 12/22/19 0609  AST 60*  ALT 68*  ALKPHOS 52  BILITOT 0.6  PROT 6.3*  ALBUMIN 3.4*    CBC: Recent Labs  Lab 12/21/19 1200 12/22/19 0609  WBC  --  12.9*  NEUTROABS  --  9.8*  HGB 13.4 12.3  HCT 40.5 37.4  MCV  --  78.7*  PLT  --  278    CBG: Recent Labs  Lab 12/21/19 2024 12/21/19 2322 12/22/19 0404 12/22/19 0751 12/22/19 1140  GLUCAP 117* 108* 112* 105* 124*    Principal Problem:   Severe obesity (BMI >= 40) (HCC) Active Problems:   Catamenial migraine   GERD   Primary osteoarthritis of left knee   Controlled diabetes mellitus type 2 with complications ([place])   S/P gastric bypass   Time coordinating discharge: 15 min  Signed:  Gayland Curry, MD [facility] Surgery, [place] [phone] 12/24/2019, 7:49 AM

## 2019-12-28 ENCOUNTER — Telehealth (HOSPITAL_COMMUNITY): Payer: Self-pay

## 2019-12-28 NOTE — Telephone Encounter (Addendum)
Patient called to discuss post bariatric surgery follow up questions as seen below.  No answer at time of call, left call back information on voicemail for return call, await patient response.  Patient left voicemail on [facility] voicemail, returned call no response at 1618 12/28/19.   1.  Tell me about your pain and pain management?  2.  Let's talk about fluid intake.  How much total fluid are you taking in?  3.  How much protein have you taken in the last 2 days?  4.  Have you had nausea?  Tell me about when have experienced nausea and what you did to help?  5.  Has the frequency or color changed with your urine?  6.  Tell me what your incisions look like?  7.  Have you been passing gas? BM?  8.  If a problem or question were to arise who would you call?  Do you know contact numbers for [place], [facility], and [facility]?  9.  How has the walking going?  10.  How are your vitamins and calcium going?  How are you taking them?

## 2019-12-29 ENCOUNTER — Other Ambulatory Visit: Payer: Self-pay

## 2019-12-29 ENCOUNTER — Ambulatory Visit (INDEPENDENT_AMBULATORY_CARE_PROVIDER_SITE_OTHER): Payer: 59 | Admitting: Family Medicine

## 2019-12-29 ENCOUNTER — Encounter: Payer: Self-pay | Admitting: Family Medicine

## 2019-12-29 VITALS — BP 138/79 | HR 94 | Ht 64.0 in | Wt 252.0 lb

## 2019-12-29 DIAGNOSIS — Z8639 Personal history of other endocrine, nutritional and metabolic disease: Secondary | ICD-10-CM | POA: Diagnosis not present

## 2019-12-29 DIAGNOSIS — L989 Disorder of the skin and subcutaneous tissue, unspecified: Secondary | ICD-10-CM

## 2019-12-29 DIAGNOSIS — Z903 Acquired absence of stomach [part of]: Secondary | ICD-10-CM | POA: Diagnosis not present

## 2019-12-29 DIAGNOSIS — Z980 Intestinal bypass and anastomosis status: Secondary | ICD-10-CM

## 2019-12-29 DIAGNOSIS — D509 Iron deficiency anemia, unspecified: Secondary | ICD-10-CM

## 2019-12-29 MED ORDER — MUPIROCIN 2 % EX OINT: TOPICAL_OINTMENT | Freq: Two times a day (BID) | CUTANEOUS | 0 refills | Status: DC

## 2019-12-29 NOTE — Progress Notes (Signed)
Established Patient Office Visit  Subjective:  Patient ID: Jamie Mccarthy, female    DOB: 05/02/1975  Age: 44 y.o. MRN: [id number]  CC:  Chief Complaint  Patient presents with  . Follow-up    HPI Jamie Mccarthy presents for s/p roux-en-y bypass on 12/21/2019.  She is doing well thus far just feeling little tired and fatigued.  But no abnormal symptoms.  She did stop her Metformin and has been doing well.  She does have an area next 1 her incisions that she would like me to look at today on that right lateral abdomen.  She says it popped up after her surgery and just wants to make sure that it looks okay she has not noticed any drainage from it.  No fevers chills or sweats.  She is still taking her vitamins and supplements she has not started the liquid iron yet.  Flu shot is up-to-date.  Past Medical History:  Diagnosis Date  . Anemia    Has sickle cell trait  . Anxiety   . Depression   . GERD (gastroesophageal reflux disease)    diet controlled  . Gestational diabetes   . H/O vaginal delivery 2010  . Headache(784.0)    Migraines  . Herpes 2007   hx of  . Iron deficiency anemia, unspecified 10/20/2012  . Neuromuscular disorder ([place])   . Obese   . PONV (postoperative nausea and vomiting)    has had PONV with previous c/s and also lap band surgery 2004  . Raynaud's disease   . Seasonal allergies     Past Surgical History:  Procedure Laterality Date  . BILATERAL SALPINGECTOMY  07/16/2012   Procedure: BILATERAL DISTAL SALPINGECTOMY;  Surgeon: Logan Bores, MD;  Location: [facility];  Service: Obstetrics;;  . CESAREAN SECTION  2012  . CESAREAN SECTION N/A 07/16/2012   Procedure: REPEAT CESAREAN SECTION;  Surgeon: Logan Bores, MD;  Location: [facility];  Service: Obstetrics;  Laterality: N/A;  . CHOLECYSTECTOMY N/A 11/14/2012   Procedure: LAPAROSCOPIC CHOLECYSTECTOMY;  Surgeon: Gayland Curry, MD;  Location: [facility];  Service: General;  Laterality: N/A;  . GASTRIC  BANDING PORT REVISION N/A 11/14/2012   Procedure: GASTRIC BANDING PORT REVISION;  Surgeon: Gayland Curry, MD;  Location: [facility];  Service: General;  Laterality: N/A;  PORT PLACEMENT MOVED NEW MESH INSERTED   . GASTRIC ROUX-EN-Y N/A 12/21/2019   Procedure: LAPAROSCOPIC ROUX-EN-Y GASTRIC BYPASS;  Surgeon: [name], MD;  Location: [facility];  Service: General;  Laterality: N/A;  . LAPAROSCOPIC GASTRIC BANDING  2005  . LAPAROSCOPIC VAGINAL HYSTERECTOMY WITH SALPINGECTOMY Bilateral 10/12/2015   Procedure: HYSTERECTOMY TOTAL LAPAROSCOPIC BILATERAL SALPINGECTOMY;  Surgeon: Sherlyn Hay, DO;  Location: [facility];  Service: Gynecology;  Laterality: Bilateral;  . reposition gastric band  2006   x2  . TUBAL LIGATION  2014  . UPPER GI ENDOSCOPY N/A 12/21/2019   Procedure: UPPER GI ENDOSCOPY;  Surgeon: [name], MD;  Location: [facility];  Service: General;  Laterality: N/A;  . WISDOM TOOTH EXTRACTION  1999    Family History  Problem Relation Age of Onset  . Hypertension Mother   . Diabetes Other        father's family  . Thyroid cancer Other   . Breast cancer Maternal Grandmother     Social History   Socioeconomic History  . Marital status: Married    Spouse name: Not on file  . Number of children: Not on file  . Years of  education: Not on file  . Highest education level: Not on file  Occupational History  . Occupation: Theatre manager: [employer]  Tobacco Use  . Smoking status: Never Smoker  . Smokeless tobacco: Never Used  Vaping Use  . Vaping Use: Never used  Substance and Sexual Activity  . Alcohol use: Yes    Comment: occ  . Drug use: No  . Sexual activity: Yes    Comment: separated, 2 caffeine drinks daily.   Other Topics Concern  . Not on file  Social History Narrative   2 caffeine drinks per day   Social Determinants of Health   Financial Resource Strain:   . Difficulty of Paying Living Expenses: Not on file  Food Insecurity:   . Worried About  Charity fundraiser in the Last Year: Not on file  . Ran Out of Food in the Last Year: Not on file  Transportation Needs:   . Lack of Transportation (Medical): Not on file  . Lack of Transportation (Non-Medical): Not on file  Physical Activity:   . Days of Exercise per Week: Not on file  . Minutes of Exercise per Session: Not on file  Stress:   . Feeling of Stress : Not on file  Social Connections:   . Frequency of Communication with Friends and Family: Not on file  . Frequency of Social Gatherings with Friends and Family: Not on file  . Attends Religious Services: Not on file  . Active Member of Clubs or Organizations: Not on file  . Attends Archivist Meetings: Not on file  . Marital Status: Not on file  Intimate Partner Violence:   . Fear of Current or Ex-Partner: Not on file  . Emotionally Abused: Not on file  . Physically Abused: Not on file  . Sexually Abused: Not on file    Outpatient Medications Prior to Visit  Medication Sig Dispense Refill  . B Complex-C (B-COMPLEX WITH VITAMIN C) tablet Take 1 tablet by mouth daily.    Eduard Roux (AIMOVIG) 70 MG/ML SOAJ Inject 70 mg into the skin every 30 (thirty) days. 1.12 mL 1  . Ferrous Sulfate (IRON PO) Take 1 tablet by mouth in the morning and at bedtime.    . gabapentin (NEURONTIN) 100 MG capsule Take 2 capsules (200 mg total) by mouth every 12 (twelve) hours. 20 capsule 0  . Lancets Misc. KIT Contour Next lancets, patient preference. For testing blood sugars up to twice daily. Dx:E11.65 1 kit 0  . ondansetron (ZOFRAN-ODT) 4 MG disintegrating tablet Take 1 tablet (4 mg total) by mouth every 6 (six) hours as needed for nausea or vomiting. 20 tablet 0  . pantoprazole (PROTONIX) 40 MG tablet Take 1 tablet (40 mg total) by mouth daily. 90 tablet 0  . Probiotic Product (PROBIOTIC PO) Take 1 tablet by mouth daily.    . sertraline (ZOLOFT) 50 MG tablet Take 1 tablet (50 mg total) by mouth daily. 30 tablet 3  . SUMAtriptan  (IMITREX) 100 MG tablet TAKE 1 TABLET BY MOUTH EVERY 2HRS AS NEEDED FOR MIGRAINE. (MAY REPEAT IN 2HRS IF HEADACHE PERSIST) (Patient taking differently: Take 100 mg by mouth every 2 (two) hours as needed for migraine. TAKE 1 TABLET BY MOUTH EVERY 2HRS AS NEEDED FOR MIGRAINE. (MAY REPEAT IN 2HRS IF HEADACHE PERSIST)) 10 tablet 2  . Accu-Chek FastClix Lancets MISC Apply topically.    . blood glucose meter kit and supplies KIT Dispense based on patient and  insurance preference. Use up to twice times daily as directed. (FOR ICD-9 250.00, 250.01). 1 each PRN  . Glucose Blood (BLOOD GLUCOSE TEST STRIPS) STRP Contour Next test strips, patient preference. For testing blood sugars twice daily. Dx: E11.65 200 strip 6  . Insulin Pen Needle (BD PEN NEEDLE NANO U/F) 32G X 4 MM MISC USE TO INJECT INTO SKIN DAILY AS DIRECTED 100 each 2  . traMADol (ULTRAM) 50 MG tablet Take 1 tablet (50 mg total) by mouth every 6 (six) hours as needed (pain). 10 tablet 0   No facility-administered medications prior to visit.    Allergies  Allergen Reactions  . Latex Itching    Pt states she gets severely itchy with latex  . Naproxen Rash    ROS Review of Systems    Objective:    Physical Exam Constitutional:      Appearance: She is well-developed.  HENT:     Head: Normocephalic and atraumatic.  Cardiovascular:     Rate and Rhythm: Normal rate and regular rhythm.     Heart sounds: Normal heart sounds.  Pulmonary:     Effort: Pulmonary effort is normal.     Breath sounds: Normal breath sounds.  Skin:    General: Skin is warm and dry.     Comments: Stiffer incisions are covered by a small piece of gauze with Tegaderm on top.  I did not see any significant erythema or induration she definitely had some bruising.  None of the dressings look like they need to be changed at this point.  Just lateral to one of her most right outer dressing she has 2 dried crusty erythematous papules that look like to have dried blood on  them.  Neurological:     Mental Status: She is alert and oriented to person, place, and time.  Psychiatric:        Behavior: Behavior normal.     BP 138/79   Pulse 94   Ht 5' 4"  (1.626 m)   Wt 252 lb (114.3 kg)   LMP 08/29/2015 (Approximate)   SpO2 100%   BMI 43.26 kg/m  Wt Readings from Last 3 Encounters:  12/29/19 252 lb (114.3 kg)  12/21/19 258 lb 12.8 oz (117.4 kg)  12/09/19 259 lb (117.5 kg)     Health Maintenance Due  Topic Date Due  . URINE MICROALBUMIN  Never done    There are no preventive care reminders to display for this patient.  Lab Results  Component Value Date   TSH 0.78 07/09/2018   Lab Results  Component Value Date   WBC 12.9 (H) 12/22/2019   HGB 12.3 12/22/2019   HCT 37.4 12/22/2019   MCV 78.7 (L) 12/22/2019   PLT 278 12/22/2019   Lab Results  Component Value Date   NA 138 12/22/2019   K 3.9 12/22/2019   CO2 24 12/22/2019   GLUCOSE 119 (H) 12/22/2019   BUN 6 12/22/2019   CREATININE 0.68 12/22/2019   BILITOT 0.6 12/22/2019   ALKPHOS 52 12/22/2019   AST 60 (H) 12/22/2019   ALT 68 (H) 12/22/2019   PROT 6.3 (L) 12/22/2019   ALBUMIN 3.4 (L) 12/22/2019   CALCIUM 8.4 (L) 12/22/2019   ANIONGAP 7 12/22/2019   Lab Results  Component Value Date   CHOL 171 07/09/2018   Lab Results  Component Value Date   HDL 57 07/09/2018   Lab Results  Component Value Date   LDLCALC 99 07/09/2018   Lab Results  Component Value Date  TRIG 60 07/09/2018   Lab Results  Component Value Date   CHOLHDL 3.0 07/09/2018   Lab Results  Component Value Date   HGBA1C 6.1 (H) 12/09/2019      Assessment & Plan:   Problem List Items Addressed This Visit      Other   S/P total gastrectomy and Roux-en-Y esophagojejunal anastomosis    Recovering and doing well thus far.  She has been able to come off the start of her medications.  She will start her bariatric classes next week and has a follow-up with the surgeon next week as well.  Wounds overall are  healing well.      Iron deficiency anemia, unspecified    Encouraged her to go ahead and purchase a liquid iron.  Need to order offline if she is having difficulty finding it.      History of diabetes mellitus, type II    Now off of metformin doing well.  Did encourage her to check her blood sugar if she starts to feel like she is having any hypoglycemia with shakes sweats etc.  Did encourage her to let me know if she is having any problems.  Otherwise I will see her back in February we will check an A1c at that point just to make sure that it is completely normalized to an A1c less than 5.7 and then we can check again in 1 year after that.       Other Visit Diagnoses    Skin lesion    -  Primary     Skin lesion-the 2 worrisome skin lesions look more consistent with a burn.  What it is a little bit unusual so I am going to go ahead and have her treat with some topical mupirocin ointment.  Meds ordered this encounter  Medications  . mupirocin ointment (BACTROBAN) 2 %    Sig: Apply topically 2 (two) times daily.    Dispense:  30 g    Refill:  0    Follow-up: Return in about 10 weeks (around 03/08/2020) for Check A1C off meds  .   I spent 30 minutes on the day of the encounter to include pre-visit record review, face-to-face time with the patient and post visit ordering of test.   Beatrice Lecher, MD

## 2019-12-29 NOTE — Progress Notes (Signed)
She would like to discuss what she should be taking since she can no longer take Metformin XR.

## 2019-12-29 NOTE — Assessment & Plan Note (Signed)
Now off of metformin doing well.  Did encourage her to check her blood sugar if she starts to feel like she is having any hypoglycemia with shakes sweats etc.  Did encourage her to let me know if she is having any problems.  Otherwise I will see her back in February we will check an A1c at that point just to make sure that it is completely normalized to an A1c less than 5.7 and then we can check again in 1 year after that.

## 2019-12-29 NOTE — Assessment & Plan Note (Signed)
Recovering and doing well thus far.  She has been able to come off the start of her medications.  She will start her bariatric classes next week and has a follow-up with the surgeon next week as well.  Wounds overall are healing well.

## 2019-12-29 NOTE — Assessment & Plan Note (Signed)
Encouraged her to go ahead and purchase a liquid iron.  Need to order offline if she is having difficulty finding it.

## 2019-12-31 ENCOUNTER — Telehealth: Payer: Self-pay | Admitting: Skilled Nursing Facility1

## 2019-12-31 NOTE — Telephone Encounter (Signed)
Pt asked if she could use the iron patch. Pt states she is taking a liquid iron.   Dietitian advised she continue her liquid iron and be sure to take it at least 2 hours from her calcium.  Pt states everything is going well she is just hungry: Dietitian advised she continue her liquid diet and to not eat any solid foods. Pt states she has been good and she will not until class next week.

## 2020-01-05 ENCOUNTER — Encounter: Payer: 59 | Attending: General Surgery | Admitting: Skilled Nursing Facility1

## 2020-01-05 ENCOUNTER — Other Ambulatory Visit: Payer: Self-pay

## 2020-01-05 DIAGNOSIS — Z8639 Personal history of other endocrine, nutritional and metabolic disease: Secondary | ICD-10-CM | POA: Diagnosis present

## 2020-01-05 DIAGNOSIS — E669 Obesity, unspecified: Secondary | ICD-10-CM | POA: Insufficient documentation

## 2020-01-05 NOTE — Progress Notes (Signed)
2 Week Post-Operative Nutrition Class   Patient was seen on 03/18/18 for Post-Operative Nutrition education at [facility]    Surgery date:  Surgery type: Conversion to RYGB Start weight at [facility]: 266 Weight today: 245.8   Body Composition Scale 01/05/2020  Total Body Fat % 44.7  Visceral Fat 15  Fat-Free Mass % 55.2   Total Body Water % 42.1   Muscle-Mass lbs 32  Body Fat Displacement          Torso  lbs 68.1         Left Leg  lbs 13.6         Right Leg  lbs 13.6         Left Arm  lbs 6.8         Right Arm   lbs 6.8     The following the learning objectives were met by the patient during this course:  Identifies Phase 3 (Soft, High Proteins) Dietary Goals and will begin from 2 weeks post-operatively to 2 months post-operatively  Identifies appropriate sources of fluids and proteins   Identifies appropriate fat sources and healthy verses unhealthy fat types    States protein recommendations and appropriate sources post-operatively  Identifies the need for appropriate texture modifications, mastication, and bite sizes when consuming solids  Identifies appropriate multivitamin and calcium sources post-operatively  Describes the need for physical activity post-operatively and will follow MD recommendations  States when to call healthcare provider regarding medication questions or post-operative complications   Handouts given during class include:  Phase 3A: Soft, High Protein Diet Handout  Phase 3 High Protein Meals  Healthy Fats   Follow-Up Plan: Patient will follow-up at [facility] in 6 weeks for 2 month post-op nutrition visit for diet advancement per MD.  

## 2020-01-06 ENCOUNTER — Other Ambulatory Visit: Payer: Self-pay | Admitting: General Surgery

## 2020-01-07 ENCOUNTER — Other Ambulatory Visit: Payer: Self-pay | Admitting: Family Medicine

## 2020-01-08 ENCOUNTER — Non-Acute Institutional Stay
Admission: RE | Admit: 2020-01-08 | Discharge: 2020-01-08 | Disposition: A | Discharge status: Home / Self Care | Payer: 59 | Source: Ambulatory Visit | Attending: General Surgery | Admitting: General Surgery

## 2020-01-08 ENCOUNTER — Other Ambulatory Visit: Payer: Self-pay

## 2020-01-08 DIAGNOSIS — R42 Dizziness and giddiness: Secondary | ICD-10-CM | POA: Insufficient documentation

## 2020-01-08 LAB — COMPREHENSIVE METABOLIC PANEL
ALT: 19 U/L (ref 0–44)
AST: 19 U/L (ref 15–41)
Albumin: 3.7 g/dL (ref 3.5–5.0)
Alkaline Phosphatase: 62 U/L (ref 38–126)
Anion gap: 10 (ref 5–15)
BUN: 7 mg/dL (ref 6–20)
CO2: 24 mmol/L (ref 22–32)
Calcium: 9.2 mg/dL (ref 8.9–10.3)
Chloride: 106 mmol/L (ref 98–111)
Creatinine, Ser: 0.62 mg/dL (ref 0.44–1.00)
GFR, Estimated: >60 mL/min (ref >60)
Glucose, Bld: 109 mg/dL — ABNORMAL HIGH (ref 70–99)
Potassium: 3.8 mmol/L (ref 3.5–5.1)
Sodium: 140 mmol/L (ref 135–145)
Total Bilirubin: 0.7 mg/dL (ref 0.3–1.2)
Total Protein: 6.8 g/dL (ref 6.5–8.1)

## 2020-01-08 MED ORDER — THIAMINE HCL 100 MG/ML IJ SOLN
Freq: Once | INTRAVENOUS | Status: AC
  Filled 2020-01-08: qty 1000

## 2020-01-08 MED ORDER — SODIUM CHLORIDE 0.9 % IV BOLUS
1000.0000 mL | Freq: Once | INTRAVENOUS | Status: AC
  Administered 2020-01-08: 1000 mL via INTRAVENOUS

## 2020-01-11 ENCOUNTER — Telehealth: Payer: Self-pay | Admitting: Skilled Nursing Facility1

## 2020-01-11 NOTE — Telephone Encounter (Signed)
RD called pt to verify fluid intake once starting soft, solid proteins 2 week post-bariatric surgery.   Daily Fluid intake: 64+ Daily Protein intake: 60+  Concerns/issues:   Pt states she has been feeling a lightheaded stating she thinks it is because she has diabetes (reading of 109): advised to eat beans throughout the week just to help keep sugars appropriate and to call if they start regularly being below 70.   Pt asked if she could ever eat applesauce again: dietitian advised yes just not in this or the next phase.

## 2020-01-31 ENCOUNTER — Encounter: Payer: Self-pay | Admitting: Family Medicine

## 2020-02-01 MED ORDER — METFORMIN HCL 500 MG PO TABS: 500.0000 mg | ORAL_TABLET | Freq: Two times a day (BID) | ORAL | 3 refills | Status: DC

## 2020-02-08 MED ORDER — BETAMETHASONE VALERATE 0.12 % EX FOAM: 1.0000 "application " | Freq: Two times a day (BID) | CUTANEOUS | 0 refills | Status: DC

## 2020-02-08 NOTE — Addendum Note (Signed)
Addended by: Beatrice Lecher D on: 02/08/2020 10:35 AM   Modules accepted: Orders

## 2020-02-08 NOTE — Telephone Encounter (Signed)
Med sent.

## 2020-02-16 ENCOUNTER — Ambulatory Visit: Payer: 59 | Admitting: Skilled Nursing Facility1

## 2020-02-20 ENCOUNTER — Other Ambulatory Visit: Payer: Self-pay | Admitting: Family Medicine

## 2020-02-20 DIAGNOSIS — G43009 Migraine without aura, not intractable, without status migrainosus: Secondary | ICD-10-CM

## 2020-03-07 ENCOUNTER — Telehealth: Payer: Self-pay | Admitting: *Deleted

## 2020-03-07 NOTE — Telephone Encounter (Signed)
Form completed for [employer] for quantity limit exception,faxed,confirmation received and scanned into patient's chart.

## 2020-03-08 ENCOUNTER — Other Ambulatory Visit: Payer: Self-pay

## 2020-03-08 ENCOUNTER — Encounter: Payer: Self-pay | Admitting: Family Medicine

## 2020-03-08 ENCOUNTER — Telehealth: Payer: Self-pay | Admitting: *Deleted

## 2020-03-08 ENCOUNTER — Ambulatory Visit: Payer: Medicaid Other | Admitting: Family Medicine

## 2020-03-08 VITALS — BP 139/84 | HR 78 | Ht 64.0 in | Wt 236.0 lb

## 2020-03-08 DIAGNOSIS — L219 Seborrheic dermatitis, unspecified: Secondary | ICD-10-CM

## 2020-03-08 DIAGNOSIS — T50905A Adverse effect of unspecified drugs, medicaments and biological substances, initial encounter: Secondary | ICD-10-CM

## 2020-03-08 DIAGNOSIS — E118 Type 2 diabetes mellitus with unspecified complications: Secondary | ICD-10-CM | POA: Diagnosis not present

## 2020-03-08 DIAGNOSIS — Z9884 Bariatric surgery status: Secondary | ICD-10-CM | POA: Diagnosis not present

## 2020-03-08 LAB — POCT GLYCOSYLATED HEMOGLOBIN (HGB A1C): Hemoglobin A1C: 6.2 % — AB (ref 4.0–5.6)

## 2020-03-08 NOTE — Assessment & Plan Note (Signed)
He is doing well.  BMI is now down to 40.  She is losing about a pound or more per week and is happy with that.  Continue to work on food choices we did discuss maybe reducing fruit intake which I think could be contributing to her diarrhea she has a follow-up with the surgeon in April.  And try to maybe keep track of the foods that seem to be triggering it.  She does well staying hydrated.  So encouraged her to just continue to make sure she is bouncing protein with sugars and carbs.

## 2020-03-08 NOTE — Progress Notes (Signed)
Established Patient Office Visit  Subjective:  Patient ID: Jamie Mccarthy, female    DOB: 1975/09/15  Age: 45 y.o. MRN: [id number]  CC:  Chief Complaint  Patient presents with  . Diabetes    HPI Jamie Mccarthy presents for    Diabetes - no hypoglycemic events. No wounds or sores that are not healing well. No increased thirst or urination. Checking glucose at home.  She was unable to take the Metformin short acting because of diarrhea.  She had tolerated the extended release well previously but is no longer able to take extended release products because of her gastric bypass surgery.  He is now status post gastric bypass surgery in November.  She continues to lose weight but slowly but she says she is happy with the progress.  Is losing about 1 to 2 pounds per week.  She has been eating grapes for breakfast and been having a lot of diarrhea since her surgery she is not sure which foods might be triggering it but she has been trying to figure that out.  At times it just seems random.  She feels like she is doing a great job with hydration.  Has a history of seborrheic dermatitis of the scalp.  She says her hairdresser actually used a prescription shampoo on her that was another clients shampoo.  And she says that it actually worked really well for her seborrheic dermatitis.  Gently she does not know the name of it.  We will try to get the name of it to [place].  I had previously written for ketoconazole shampoo for her and betamethasone foam.   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   Past Medical History:  Diagnosis Date  . Anemia    Has sickle cell trait  . Anxiety   . Depression   . GERD (gastroesophageal reflux disease)    diet controlled  . Gestational diabetes   . H/O vaginal delivery 2010  . Headache(784.0)    Migraines  . Herpes 2007   hx of  . Iron deficiency anemia, unspecified 10/20/2012  . Neuromuscular disorder ([place])   . Obese   . PONV (postoperative  nausea and vomiting)    has had PONV with previous c/s and also lap band surgery 2004  . Raynaud's disease   . Seasonal allergies     Past Surgical History:  Procedure Laterality Date  . BILATERAL SALPINGECTOMY  07/16/2012   Procedure: BILATERAL DISTAL SALPINGECTOMY;  Surgeon: Logan Bores, MD;  Location: [facility];  Service: Obstetrics;;  . CESAREAN SECTION  2012  . CESAREAN SECTION N/A 07/16/2012   Procedure: REPEAT CESAREAN SECTION;  Surgeon: Logan Bores, MD;  Location: [facility];  Service: Obstetrics;  Laterality: N/A;  . CHOLECYSTECTOMY N/A 11/14/2012   Procedure: LAPAROSCOPIC CHOLECYSTECTOMY;  Surgeon: Gayland Curry, MD;  Location: [facility];  Service: General;  Laterality: N/A;  . GASTRIC BANDING PORT REVISION N/A 11/14/2012   Procedure: GASTRIC BANDING PORT REVISION;  Surgeon: Gayland Curry, MD;  Location: [facility];  Service: General;  Laterality: N/A;  PORT PLACEMENT MOVED NEW MESH INSERTED   . GASTRIC ROUX-EN-Y N/A 12/21/2019   Procedure: LAPAROSCOPIC ROUX-EN-Y GASTRIC BYPASS;  Surgeon: [name], MD;  Location: [facility];  Service: General;  Laterality: N/A;  . LAPAROSCOPIC GASTRIC BANDING  2005  . LAPAROSCOPIC VAGINAL HYSTERECTOMY WITH SALPINGECTOMY Bilateral 10/12/2015   Procedure: HYSTERECTOMY TOTAL LAPAROSCOPIC BILATERAL SALPINGECTOMY;  Surgeon: Sherlyn Hay, DO;  Location: [facility];  Service: Gynecology;  Laterality: Bilateral;  . reposition gastric band  2006   x2  . TUBAL LIGATION  2014  . UPPER GI ENDOSCOPY N/A 12/21/2019   Procedure: UPPER GI ENDOSCOPY;  Surgeon: [name], MD;  Location: [facility];  Service: General;  Laterality: N/A;  . WISDOM TOOTH EXTRACTION  1999    Family History  Problem Relation Age of Onset  . Hypertension Mother   . Diabetes Other        father's family  . Thyroid cancer Other   . Breast cancer Maternal Grandmother     Social History   Socioeconomic History  . Marital status: Married    Spouse name: Not on file  . Number  of children: Not on file  . Years of education: Not on file  . Highest education level: Not on file  Occupational History  . Occupation: Theatre manager: [employer]  Tobacco Use  . Smoking status: Never Smoker  . Smokeless tobacco: Never Used  Vaping Use  . Vaping Use: Never used  Substance and Sexual Activity  . Alcohol use: Yes    Comment: occ  . Drug use: No  . Sexual activity: Yes    Comment: separated, 2 caffeine drinks daily.   Other Topics Concern  . Not on file  Social History Narrative   2 caffeine drinks per day   Social Determinants of Health   Financial Resource Strain: Not on file  Food Insecurity: Not on file  Transportation Needs: Not on file  Physical Activity: Not on file  Stress: Not on file  Social Connections: Not on file  Intimate Partner Violence: Not on file    Outpatient Medications Prior to Visit  Medication Sig Dispense Refill  . AIMOVIG 70 MG/ML SOAJ INJECT 70MG INTO THE SKIN EVERY 30 DAYS 1 mL 6  . B Complex-C (B-COMPLEX WITH VITAMIN C) tablet Take 1 tablet by mouth daily.    . Betamethasone Valerate 0.12 % foam Apply 1 application topically 2 (two) times daily. To scalp 100 g 0  . fluconazole (DIFLUCAN) 200 MG tablet Take by mouth.  1  . Lancets Misc. KIT Contour Next lancets, patient preference. For testing blood sugars up to twice daily. Dx:E11.65 1 kit 0  . Probiotic Product (PROBIOTIC PO) Take 1 tablet by mouth daily.    . sertraline (ZOLOFT) 50 MG tablet Take 1 tablet (50 mg total) by mouth daily. 30 tablet 3  . SUMAtriptan (IMITREX) 100 MG tablet TAKE 1 TABLET BY MOUTH EVERY 2HRS AS NEEDED FOR MIGRAINE. (MAY REPEAT IN 2HRS IF HEADACHE PERSIST) (Patient taking differently: Take 100 mg by mouth every 2 (two) hours as needed for migraine. TAKE 1 TABLET BY MOUTH EVERY 2HRS AS NEEDED FOR MIGRAINE. (MAY REPEAT IN 2HRS IF HEADACHE PERSIST)) 10 tablet 2  . Ferrous Sulfate (IRON PO) Take 1 tablet by mouth in the morning and at bedtime.     . gabapentin (NEURONTIN) 100 MG capsule Take 2 capsules (200 mg total) by mouth every 12 (twelve) hours. 20 capsule 0  . metFORMIN (GLUCOPHAGE) 500 MG tablet Take 1 tablet (500 mg total) by mouth 2 (two) times daily with a meal. (Patient not taking: Reported on 03/08/2020) 60 tablet 3  . mupirocin ointment (BACTROBAN) 2 % Apply topically 2 (two) times daily. 30 g 0  . ondansetron (ZOFRAN-ODT) 4 MG disintegrating tablet Take 1 tablet (4 mg total) by mouth every 6 (six) hours as needed for nausea or vomiting. [place]  tablet 0  . pantoprazole (PROTONIX) 40 MG tablet Take 1 tablet (40 mg total) by mouth daily. 90 tablet 0   No facility-administered medications prior to visit.    Allergies  Allergen Reactions  . Latex Itching    Pt states she gets severely itchy with latex  . Metformin And Related Diarrhea  . Naproxen Rash    ROS Review of Systems    Objective:    Physical Exam Constitutional:      Appearance: She is well-developed and well-nourished.  HENT:     Head: Normocephalic and atraumatic.  Cardiovascular:     Rate and Rhythm: Normal rate and regular rhythm.     Heart sounds: Normal heart sounds.  Pulmonary:     Effort: Pulmonary effort is normal.     Breath sounds: Normal breath sounds.  Skin:    General: Skin is warm and dry.  Neurological:     Mental Status: She is alert and oriented to person, place, and time.  Psychiatric:        Mood and Affect: Mood and affect normal.        Behavior: Behavior normal.     BP 139/84   Pulse 78   Ht 5' 4"  (1.626 m)   Wt 236 lb (107 kg)   LMP 08/29/2015 (Approximate)   SpO2 100%   BMI 40.51 kg/m  Wt Readings from Last 3 Encounters:  03/08/20 236 lb (107 kg)  01/05/20 245 lb 12.8 oz (111.5 kg)  12/29/19 252 lb (114.3 kg)     Health Maintenance Due  Topic Date Due  . FOOT EXAM  Never done  . OPHTHALMOLOGY EXAM  Never done  . URINE MICROALBUMIN  Never done    There are no preventive care reminders to display for  this patient.  Lab Results  Component Value Date   TSH 0.78 07/09/2018   Lab Results  Component Value Date   WBC 12.9 (H) 12/22/2019   HGB 12.3 12/22/2019   HCT 37.4 12/22/2019   MCV 78.7 (L) 12/22/2019   PLT 278 12/22/2019   Lab Results  Component Value Date   NA 140 01/08/2020   K 3.8 01/08/2020   CO2 24 01/08/2020   GLUCOSE 109 (H) 01/08/2020   BUN 7 01/08/2020   CREATININE 0.62 01/08/2020   BILITOT 0.7 01/08/2020   ALKPHOS 62 01/08/2020   AST 19 01/08/2020   ALT 19 01/08/2020   PROT 6.8 01/08/2020   ALBUMIN 3.7 01/08/2020   CALCIUM 9.2 01/08/2020   ANIONGAP 10 01/08/2020   Lab Results  Component Value Date   CHOL 171 07/09/2018   Lab Results  Component Value Date   HDL 57 07/09/2018   Lab Results  Component Value Date   LDLCALC 99 07/09/2018   Lab Results  Component Value Date   TRIG 60 07/09/2018   Lab Results  Component Value Date   CHOLHDL 3.0 07/09/2018   Lab Results  Component Value Date   HGBA1C 6.2 (A) 03/08/2020      Assessment & Plan:   Problem List Items Addressed This Visit      Endocrine   Controlled type 2 diabetes mellitus with complication, without long-term current use of insulin (HCC) - Primary    A1c is stable at 6.2 off of the Metformin.  Continue to work on healthy diet staying active and weight loss.  We will plan to recheck again in 90 days.      Relevant Orders   POCT glycosylated hemoglobin (  Hb A1C) (Completed)     Musculoskeletal and Integument   Seborrheic dermatitis of scalp    Pressure to find out from her hairdresser what type of shampoo she used and we can see if it is appropriate and we can write for for her.  For now she has the ketoconazole 2% shampoo as well as the betamethasone foam.        Other   S/P gastric bypass    He is doing well.  BMI is now down to 40.  She is losing about a pound or more per week and is happy with that.  Continue to work on food choices we did discuss maybe reducing fruit  intake which I think could be contributing to her diarrhea she has a follow-up with the surgeon in April.  And try to maybe keep track of the foods that seem to be triggering it.  She does well staying hydrated.  So encouraged her to just continue to make sure she is bouncing protein with sugars and carbs.       Other Visit Diagnoses    Medication side effect, initial encounter          No orders of the defined types were placed in this encounter.   Follow-up: Return in about 3 months (around 06/05/2020) for Diabetes follow-up.    Beatrice Lecher, MD

## 2020-03-08 NOTE — Assessment & Plan Note (Signed)
A1c is stable at 6.2 off of the Metformin.  Continue to work on healthy diet staying active and weight loss.  We will plan to recheck again in 90 days.

## 2020-03-08 NOTE — Assessment & Plan Note (Addendum)
Pressure to find out from her hairdresser what type of shampoo she used and we can see if it is appropriate and we can write for for her.  For now she has the ketoconazole 2% shampoo as well as the betamethasone foam.

## 2020-03-08 NOTE — Telephone Encounter (Signed)
Opened in error

## 2020-03-08 NOTE — Telephone Encounter (Signed)
Extra paper work sent to [employer] for [employer].  Received approval from 03/07/20-05/29/20. Pharmacy notified.

## 2020-03-08 NOTE — Progress Notes (Signed)
Pt reports that she only took the Metformin for 1 wk and stopped because it caused GI upset. And she didn't notice a change in her blood sugars

## 2020-03-09 MED ORDER — CLOBETASOL PROPIONATE 0.05 % EX SHAM: MEDICATED_SHAMPOO | CUTANEOUS | 3 refills | Status: DC

## 2020-03-09 NOTE — Telephone Encounter (Signed)
Med sent.

## 2020-03-23 ENCOUNTER — Encounter: Payer: Medicaid Other | Admitting: Skilled Nursing Facility1

## 2020-03-23 NOTE — Progress Notes (Signed)
Attempted a virtual; visit but the pts internet was too weak so had to reschedule.

## 2020-03-28 ENCOUNTER — Encounter: Payer: Medicaid Other | Attending: General Surgery | Admitting: Skilled Nursing Facility1

## 2020-03-28 DIAGNOSIS — E669 Obesity, unspecified: Secondary | ICD-10-CM | POA: Diagnosis not present

## 2020-03-28 DIAGNOSIS — E118 Type 2 diabetes mellitus with unspecified complications: Secondary | ICD-10-CM

## 2020-03-28 NOTE — Progress Notes (Addendum)
Bariatric Nutrition Follow-Up Visit Medical Nutrition Therapy    Post-Operative RYGB Surgery  NUTRITION ASSESSMENT    Anthropometrics  Start weight at [facility]: 266 lbs Today's weight: virtual appt: pt agreed to this visit type limitation, Identified by name and DOB Medications: off metformin Labs:    Lifestyle & Dietary Hx  Pt states she has had horrible diarrhea lasting a couple hours stating she has been eating fruit stating she was having cravings recognizing she was eating outside of the phase. Pt states she has not had any diarrhea in the last week. Pt states she has been eating lettuce as well. Pt states she thinks maybe [place] was causing the diarrhea. Pt states she struggles to drink her fluid stating she is in school so not allowed to drink. Pt states she has been drinking carbonated water causing gas: dietitian advised pt to not continue this behavior. Pt states she weighed herself at home which was 229 pounds.   Estimated daily fluid intake: 32 oz Estimated daily protein intake: 60+ g Supplements: multi bought from the store and calcium Current average weekly physical activity: piliates 1 day a week, yoga/barre 1 day a week, wlaking on treadmill 5 days a week 35 minutes   24-Hr Dietary Recall First Meal: nuts Snack:  Second Meal: nuts or  humus or shrimp or chicken or beans + hot sauce + onion Snack: pretzels Third Meal: humus or shrimp or chicken or beans + hot sauce + onion Snack:  Beverages: water, Gatorade zero  Post-Op Goals/ Signs/ Symptoms Using straws: no Drinking while eating: no Chewing/swallowing difficulties: no Changes in vision: no Changes to mood/headaches: no Hair loss/changes to skin/nails: no Difficulty focusing/concentrating: no Sweating: no Dizziness/lightheadedness: no Palpitations: no  Carbonated/caffeinated beverages: non N/V/D/C/Gas: some dirrahea Abdominal pain: no Dumping syndrome: no    NUTRITION DIAGNOSIS  Overweight/obesity  (-3.3) related to past poor dietary habits and physical inactivity as evidenced by completed bariatric surgery and following dietary guidelines for continued weight loss and healthy nutrition status.     NUTRITION INTERVENTION Nutrition counseling (C-1) and education (E-2) to facilitate bariatric surgery goals, including: . Diet advancement to the next phase (phase 4) now including non starchy vegetables . The importance of consuming adequate calories as well as certain nutrients daily due to the body's need for essential vitamins, minerals, and fats . The importance of daily physical activity and to reach a goal of at least 150 minutes of moderate to vigorous physical activity weekly (or as directed by their physician) due to benefits such as increased musculature and improved lab values . The importance of intuitive eating specifically learning hunger-satiety cues and understanding the importance of learning a new body: The importance of mindful eating to avoid grazing behaviors   Handouts Provided Include   Phase 4    Goals: -Continue to aim for a minimum of 64 fluid ounces 7 days a week with at least 30 ounces being plain water  -Eat non-starchy vegetables 2 times a day 7 days a week  -Start out with soft cooked vegetables today and tomorrow; if tolerated begin to eat raw vegetables or cooked including salads  -Eat your 3 ounces of protein first then start in on your non-starchy vegetables; once you understand how much of your meal leads to satisfaction and not full while still eating 3 ounces of protein and non-starchy vegetables you can eat them in any order   -Continue to aim for 30 minutes of activity at least 5 times a week  -  Do NOT cook with/add to your food: alfredo sauce, cheese sauce, barbeque sauce, ketchup, fat back, butter, bacon grease, grease, Crisco, OR SUGAR   Learning Style & Readiness for Change Teaching method utilized: Visual & Auditory  Demonstrated degree of  understanding via: Teach Back  Readiness Level: contemplative Barriers to learning/adherence to lifestyle change:   RD's Notes for Next Visit . Assess adherence to pt chosen goals   MONITORING & EVALUATION Dietary intake, weekly physical activity, body weight Next Steps Patient is to follow-up in 3-4 months

## 2020-03-31 ENCOUNTER — Encounter: Payer: Self-pay | Admitting: Family Medicine

## 2020-04-05 ENCOUNTER — Encounter: Payer: Self-pay | Admitting: Family Medicine

## 2020-04-05 NOTE — Progress Notes (Signed)
Negative for intraepithelial lesion or malignancy.  

## 2020-04-13 DIAGNOSIS — N898 Other specified noninflammatory disorders of vagina: Secondary | ICD-10-CM | POA: Diagnosis not present

## 2020-04-13 DIAGNOSIS — Z1389 Encounter for screening for other disorder: Secondary | ICD-10-CM | POA: Diagnosis not present

## 2020-04-13 DIAGNOSIS — Z6841 Body Mass Index (BMI) 40.0 and over, adult: Secondary | ICD-10-CM | POA: Diagnosis not present

## 2020-04-13 DIAGNOSIS — Z130 Encounter for screening for diseases of the blood and blood-forming organs and certain disorders involving the immune mechanism: Secondary | ICD-10-CM | POA: Diagnosis not present

## 2020-04-13 DIAGNOSIS — Z1231 Encounter for screening mammogram for malignant neoplasm of breast: Secondary | ICD-10-CM | POA: Diagnosis not present

## 2020-04-13 DIAGNOSIS — Z01419 Encounter for gynecological examination (general) (routine) without abnormal findings: Secondary | ICD-10-CM | POA: Diagnosis not present

## 2020-04-13 LAB — HM MAMMOGRAPHY

## 2020-04-27 ENCOUNTER — Other Ambulatory Visit (HOSPITAL_COMMUNITY): Payer: Self-pay

## 2020-05-19 ENCOUNTER — Telehealth: Payer: Self-pay

## 2020-05-19 ENCOUNTER — Other Ambulatory Visit: Payer: Self-pay

## 2020-05-19 ENCOUNTER — Encounter: Payer: Self-pay | Admitting: Family Medicine

## 2020-05-19 ENCOUNTER — Ambulatory Visit (INDEPENDENT_AMBULATORY_CARE_PROVIDER_SITE_OTHER): Payer: Medicaid Other | Admitting: Family Medicine

## 2020-05-19 VITALS — BP 122/82 | HR 81 | Temp 97.7°F | Ht 64.0 in | Wt 234.1 lb

## 2020-05-19 DIAGNOSIS — H44002 Unspecified purulent endophthalmitis, left eye: Secondary | ICD-10-CM | POA: Diagnosis not present

## 2020-05-19 DIAGNOSIS — H01004 Unspecified blepharitis left upper eyelid: Secondary | ICD-10-CM | POA: Diagnosis not present

## 2020-05-19 DIAGNOSIS — B3731 Acute candidiasis of vulva and vagina: Secondary | ICD-10-CM

## 2020-05-19 DIAGNOSIS — E559 Vitamin D deficiency, unspecified: Secondary | ICD-10-CM | POA: Diagnosis not present

## 2020-05-19 DIAGNOSIS — R5383 Other fatigue: Secondary | ICD-10-CM

## 2020-05-19 DIAGNOSIS — B373 Candidiasis of vulva and vagina: Secondary | ICD-10-CM

## 2020-05-19 DIAGNOSIS — J301 Allergic rhinitis due to pollen: Secondary | ICD-10-CM | POA: Diagnosis not present

## 2020-05-19 DIAGNOSIS — L299 Pruritus, unspecified: Secondary | ICD-10-CM | POA: Diagnosis not present

## 2020-05-19 MED ORDER — VITAMIN D (ERGOCALCIFEROL) 1.25 MG (50000 UNIT) PO CAPS: 1.0000 | ORAL_CAPSULE | ORAL | 1 refills | Status: DC

## 2020-05-19 MED ORDER — FLUCONAZOLE 200 MG PO TABS: ORAL_TABLET | ORAL | 3 refills | Status: DC

## 2020-05-19 MED ORDER — ERYTHROMYCIN 5 MG/GM OP OINT: 1.0000 "application " | TOPICAL_OINTMENT | Freq: Three times a day (TID) | OPHTHALMIC | 0 refills | Status: AC

## 2020-05-19 MED ORDER — LEVOCETIRIZINE DIHYDROCHLORIDE 5 MG PO TABS
5.0000 mg | ORAL_TABLET | Freq: Every evening | ORAL | 3 refills | Status: DC
Start: 2020-05-19 — End: 2021-05-01

## 2020-05-19 MED ORDER — FLUOCINOLONE ACETONIDE 0.01 % OT OIL: 3.0000 [drp] | TOPICAL_OIL | Freq: Every day | OTIC | 1 refills | Status: DC

## 2020-05-19 NOTE — Telephone Encounter (Signed)
PA renewal for AIMOVIG 70 MG/ML SOAJ sent. Awaiting response.

## 2020-05-19 NOTE — Patient Instructions (Signed)
Try to get in with your eye doctor for vision changes and pain. See Dr. Madilyn Fireman around May 15 or after for diabetes follow-up

## 2020-05-19 NOTE — Progress Notes (Signed)
Acute Office Visit  Subjective:    Patient ID: Jamie Mccarthy, female    DOB: Oct 31, 1975, 45 y.o.   MRN: [id number]  Chief Complaint  Patient presents with  . Eye Problem  . Allergic Rhinitis   . Fatigue    HPI Patient is in today for left eye discomfort.  Patient reports both of her eyes are mildly red, itchy/watery, but this has been going on for all of allergy season. She wants to switch to Xyzal and see if this makes a difference.   However, her left eye lid has been red, swollen, and painful for the past 2 days. She states that she went to have her fake eye lashes filled in on Tuesday and the lady accidentally scratched or pinched her left upper lid with tweezers while trying to fix the lashes. (She states that she is allergic to latex, though someone confirmed there was no latex in the glue, and the right eye is not having the same reaction so this is a less likely cause). When she got home Tuesday night, her left upper lid felt more irritated/itchy than usual, but she tried her best not to rub it (unsure if she may have rubbed it while sleeping). When she woke up Wednesday morning it was very watery and the upper lid margin was red, swollen, and slightly "crusty". She states the area is mildly painful, 2/10 soreness. She does think her vision may be a little more blurry than it had been previously, however she thinks her vision has been changing in general and she was planning to get in with her eye doctor soon for a new prescription for her contacts. She is wearing contacts today.  She denies any purulent drainage, severe pain, URI symptoms, rashes, fevers.    Patient also expressing a concern regarding increase in fatigue over the past month or so. States she had weight loss surgery last fall and has been working out 3-4x/week, but just feels very more tired than she feels is normal. She has not had any lab work since the procedure and is wondering if something may be off now. She  has been supplementing Vitamin D, iron. Eating a healthy diet.  Past Medical History:  Diagnosis Date  . Anemia    Has sickle cell trait  . Anxiety   . Depression   . GERD (gastroesophageal reflux disease)    diet controlled  . Gestational diabetes   . H/O vaginal delivery 2010  . Headache(784.0)    Migraines  . Herpes 2007   hx of  . Iron deficiency anemia, unspecified 10/20/2012  . Neuromuscular disorder ([place])   . Obese   . PONV (postoperative nausea and vomiting)    has had PONV with previous c/s and also lap band surgery 2004  . Raynaud's disease   . Seasonal allergies     Past Surgical History:  Procedure Laterality Date  . BILATERAL SALPINGECTOMY  07/16/2012   Procedure: BILATERAL DISTAL SALPINGECTOMY;  Surgeon: Logan Bores, MD;  Location: [facility];  Service: Obstetrics;;  . CESAREAN SECTION  2012  . CESAREAN SECTION N/A 07/16/2012   Procedure: REPEAT CESAREAN SECTION;  Surgeon: Logan Bores, MD;  Location: [facility];  Service: Obstetrics;  Laterality: N/A;  . CHOLECYSTECTOMY N/A 11/14/2012   Procedure: LAPAROSCOPIC CHOLECYSTECTOMY;  Surgeon: Gayland Curry, MD;  Location: [facility];  Service: General;  Laterality: N/A;  . GASTRIC BANDING PORT REVISION N/A 11/14/2012   Procedure: GASTRIC BANDING PORT REVISION;  Surgeon: Gayland Curry, MD;  Location: [facility];  Service: General;  Laterality: N/A;  PORT PLACEMENT MOVED NEW MESH INSERTED   . GASTRIC ROUX-EN-Y N/A 12/21/2019   Procedure: LAPAROSCOPIC ROUX-EN-Y GASTRIC BYPASS;  Surgeon: [name], MD;  Location: [facility];  Service: General;  Laterality: N/A;  . LAPAROSCOPIC GASTRIC BANDING  2005  . LAPAROSCOPIC VAGINAL HYSTERECTOMY WITH SALPINGECTOMY Bilateral 10/12/2015   Procedure: HYSTERECTOMY TOTAL LAPAROSCOPIC BILATERAL SALPINGECTOMY;  Surgeon: Sherlyn Hay, DO;  Location: [facility];  Service: Gynecology;  Laterality: Bilateral;  . reposition gastric band  2006   x2  . TUBAL LIGATION  2014  . UPPER GI ENDOSCOPY  N/A 12/21/2019   Procedure: UPPER GI ENDOSCOPY;  Surgeon: [name], MD;  Location: [facility];  Service: General;  Laterality: N/A;  . WISDOM TOOTH EXTRACTION  1999    Family History  Problem Relation Age of Onset  . Hypertension Mother   . Diabetes Other        father's family  . Thyroid cancer Other   . Breast cancer Maternal Grandmother     Social History   Socioeconomic History  . Marital status: Married    Spouse name: Not on file  . Number of children: Not on file  . Years of education: Not on file  . Highest education level: Not on file  Occupational History  . Occupation: Theatre manager: [employer]  Tobacco Use  . Smoking status: Never Smoker  . Smokeless tobacco: Never Used  Vaping Use  . Vaping Use: Never used  Substance and Sexual Activity  . Alcohol use: Yes    Comment: occ  . Drug use: No  . Sexual activity: Yes    Comment: separated, 2 caffeine drinks daily.   Other Topics Concern  . Not on file  Social History Narrative   2 caffeine drinks per day   Social Determinants of Health   Financial Resource Strain: Not on file  Food Insecurity: Not on file  Transportation Needs: Not on file  Physical Activity: Not on file  Stress: Not on file  Social Connections: Not on file  Intimate Partner Violence: Not on file    Outpatient Medications Prior to Visit  Medication Sig Dispense Refill  . AIMOVIG 70 MG/ML SOAJ INJECT 70MG INTO THE SKIN EVERY 30 DAYS 1 mL 6  . B Complex-C (B-COMPLEX WITH VITAMIN C) tablet Take 1 tablet by mouth daily.    . Betamethasone Valerate 0.12 % foam Apply 1 application topically 2 (two) times daily. To scalp 100 g 0  . Clobetasol Propionate 0.05 % shampoo Wash scalp once or twice weekly with shampoo 118 mL 3  . Ferrous Sulfate (IRON PO) Take 1 tablet by mouth in the morning and at bedtime.    . Lancets Misc. KIT Contour Next lancets, patient preference. For testing blood sugars up to twice daily. Dx:E11.65 1 kit 0   . Probiotic Product (PROBIOTIC PO) Take 1 tablet by mouth daily.    . sertraline (ZOLOFT) 50 MG tablet Take 1 tablet (50 mg total) by mouth daily. 30 tablet 3  . SUMAtriptan (IMITREX) 100 MG tablet TAKE 1 TABLET BY MOUTH EVERY 2HRS AS NEEDED FOR MIGRAINE. (MAY REPEAT IN 2HRS IF HEADACHE PERSIST) (Patient taking differently: Take 100 mg by mouth every 2 (two) hours as needed for migraine. TAKE 1 TABLET BY MOUTH EVERY 2HRS AS NEEDED FOR MIGRAINE. (MAY REPEAT IN 2HRS IF HEADACHE PERSIST)) 10 tablet 2  . fluconazole (DIFLUCAN)  200 MG tablet Take by mouth.  1  . Fluocinolone Acetonide 0.01 % OIL Place 3 drops into both ears at bedtime.    . Vitamin D, Ergocalciferol, (DRISDOL) 1.25 MG (50000 UNIT) CAPS capsule Take 1 capsule by mouth once a week.     No facility-administered medications prior to visit.    Allergies  Allergen Reactions  . Latex Itching    Pt states she gets severely itchy with latex  . Metformin And Related Diarrhea  . Naproxen Rash    Review of Systems  Constitutional: Positive for fatigue. Negative for fever.  HENT: Negative for congestion.   Eyes: Positive for pain, discharge, redness, itching and visual disturbance. Negative for photophobia.  Respiratory: Negative for chest tightness and shortness of breath.   Cardiovascular: Negative for chest pain and palpitations.  Gastrointestinal: Negative for blood in stool, nausea and vomiting.  Genitourinary: Negative for dysuria, hematuria and vaginal bleeding.  Musculoskeletal: Negative for myalgias.  Skin: Negative for rash.  Neurological: Negative for dizziness, weakness and headaches.       Objective:    Physical Exam Constitutional:      General: She is not in acute distress.    Appearance: Normal appearance.  HENT:     Head: Normocephalic and atraumatic.  Eyes:     General:        Left eye: Discharge present.    Extraocular Movements: Extraocular movements intact.     Pupils: Pupils are equal, round, and  reactive to light.      Comments: Faint pink/rednes to bilateral sclera with watering consistent with her seasonal allergies  Musculoskeletal:     Cervical back: Normal range of motion.  Skin:    General: Skin is warm and dry.     Capillary Refill: Capillary refill takes less than 2 seconds.  Neurological:     General: No focal deficit present.     Mental Status: She is alert and oriented to person, place, and time. Mental status is at baseline.  Psychiatric:        Mood and Affect: Mood normal.        Behavior: Behavior normal.        Thought Content: Thought content normal.        Judgment: Judgment normal.     Visual Acuity Screening   Right eye Left eye Both eyes  Without correction: _0  With correction: _1      BP 122/82   Pulse 81   Temp 97.7 F (36.5 C)   Ht _2  (1.626 m)   Wt 234 lb 1.3 oz (106.2 kg)   LMP 08/29/2015 (Approximate)   SpO2 96%   BMI 40.18 kg/m  Wt Readings from Last 3 Encounters:  05/19/20 234 lb 1.3 oz (106.2 kg)  03/08/20 236 lb (107 kg)  01/05/20 245 lb 12.8 oz (111.5 kg)    Health Maintenance Due  Topic Date Due  . FOOT EXAM  Never done  . OPHTHALMOLOGY EXAM  Never done  . URINE MICROALBUMIN  Never done  . MAMMOGRAM  10/14/2019    There are no preventive care reminders to display for this patient.   Lab Results  Component Value Date   TSH 0.78 07/09/2018   Lab Results  Component Value Date   WBC 12.9 (H) 12/22/2019   HGB 12.3 12/22/2019   HCT 37.4 12/22/2019   MCV 78.7 (L) 12/22/2019   PLT 278 12/22/2019   Lab Results  Component Value Date  NA 140 01/08/2020   K 3.8 01/08/2020   CO2 24 01/08/2020   GLUCOSE 109 (H) 01/08/2020   BUN 7 01/08/2020   CREATININE 0.62 01/08/2020   BILITOT 0.7 01/08/2020   ALKPHOS 62 01/08/2020   AST 19 01/08/2020   ALT 19 01/08/2020   PROT 6.8 01/08/2020   ALBUMIN 3.7 01/08/2020   CALCIUM 9.2 01/08/2020   ANIONGAP 10 01/08/2020   Lab Results   Component Value Date   CHOL 171 07/09/2018   Lab Results  Component Value Date   HDL 57 07/09/2018   Lab Results  Component Value Date   LDLCALC 99 07/09/2018   Lab Results  Component Value Date   TRIG 60 07/09/2018   Lab Results  Component Value Date   CHOLHDL 3.0 07/09/2018   Lab Results  Component Value Date   HGBA1C 6.2 (A) 03/08/2020       Assessment & Plan:   1. Vaginal candidiasis Patient requesting refill on her Diflucan. No acute concerns today.  - fluconazole (DIFLUCAN) 200 MG tablet; Take 1 tablet (227m) by mouth every 3 days for 3 doses ( 6 day span) then take 1 tablet by mouth once a week.  Dispense: 6 tablet; Refill: 3  2. Fatigue, unspecified type Patient with recent increase in fatigue over the past month, despite her efforts to exercise almost daily and eat healthier. We will recheck labs today for potential causes of her fatigue and I will refill her Vitamin D. Will update patient with results and any changes to plan.  - Vitamin D, Ergocalciferol, (DRISDOL) 1.25 MG (50000 UNIT) CAPS capsule; Take 1 capsule (50,000 Units total) by mouth once a week.  Dispense: 5 capsule; Refill: 1 - TSH - Fe+TIBC+Fer - VITAMIN D 25 Hydroxy (Vit-D Deficiency, Fractures) - CBC with Differential  3. Eye infection, left Possible Blepharitis. See below. - erythromycin ophthalmic ointment; Place 1 application into the left eye 3 (three) times daily for 7 days. Apply 1 inch ribbon to affected eye TID for 5 days.  Dispense: 21 g; Refill: 0  4. Seasonal allergic rhinitis due to pollen Patient with worse allergies this year. She has been taking Zyrtec, but doesn't feel like it is doing enough. She would like to switch to Xyzal. Will order today. Also encourage flonase and local honey. - levocetirizine (XYZAL) 5 MG tablet; Take 1 tablet (5 mg total) by mouth every evening.  Dispense: 90 tablet; Refill: 3  5. Ear itch Patient requesting refill on her otic oil. No acute concerns  today. - Fluocinolone Acetonide 0.01 % OIL; Place 3 drops into both ears at bedtime.  Dispense: 20 mL; Refill: 1  6. Blepharitis of left upper eyelid, unspecified type Presentation consistent with blepharitis. Recent application of artificial lashes could be the source. Recommend she apply warm compresses and use antibiotic eye ointment for the next week or two. Also recommend she go without artificial lashes or contacts during this time period until infection is resolved. Hand hygiene discussed. Given her gradual vision changes and possible increase in blurred vision since infection began, recommending she go see her doctor as soon as possible. Encouraged to let [facility] if she cannot get in soon and/or needs a referral. Educated on signs and symptoms requiring urgent evaluation.  - erythromycin ophthalmic ointment; Place 1 application into the left eye 3 (three) times daily for 7 days. Apply 1 inch ribbon to affected eye TID for 5 days.  Dispense: 21 g; Refill: 0  Follow-up as needed, if  symptoms worsen or fail to improve. Follow-up with PCP around May 15th for DM follow-up and A1c.  Terrilyn Saver, NP

## 2020-05-20 LAB — IRON,TIBC AND FERRITIN PANEL
%SAT: 16 % (calc) (ref 16–45)
Ferritin: 17 ng/mL (ref 16–232)
Iron: 56 ug/dL (ref 40–190)
TIBC: 354 mcg/dL (calc) (ref 250–450)

## 2020-05-20 LAB — TSH: TSH: 1.04 mIU/L

## 2020-05-20 LAB — CBC WITH DIFFERENTIAL/PLATELET
Absolute Monocytes: 569 cells/uL (ref 200–950)
Basophils Absolute: 31 cells/uL (ref 0–200)
Basophils Relative: 0.4 %
Eosinophils Absolute: 320 cells/uL (ref 15–500)
Eosinophils Relative: 4.1 %
HCT: 40.8 % (ref 35.0–45.0)
Hemoglobin: 13.8 g/dL (ref 11.7–15.5)
Lymphs Abs: 3245 cells/uL (ref 850–3900)
MCH: 28.2 pg (ref 27.0–33.0)
MCHC: 33.8 g/dL (ref 32.0–36.0)
MCV: 83.4 fL (ref 80.0–100.0)
MPV: 10.4 fL (ref 7.5–12.5)
Monocytes Relative: 7.3 %
Neutro Abs: 3635 cells/uL (ref 1500–7800)
Neutrophils Relative %: 46.6 %
Platelets: 342 10*3/uL (ref 140–400)
RBC: 4.89 10*6/uL (ref 3.80–5.10)
RDW: 13 % (ref 11.0–15.0)
Total Lymphocyte: 41.6 %
WBC: 7.8 10*3/uL (ref 3.8–10.8)

## 2020-05-20 LAB — VITAMIN D 25 HYDROXY (VIT D DEFICIENCY, FRACTURES): Vit D, 25-Hydroxy: 39 ng/mL (ref 30–100)

## 2020-05-20 NOTE — Progress Notes (Signed)
MyChart message sent: All of your labs look great!

## 2020-06-06 ENCOUNTER — Ambulatory Visit: Payer: Medicaid Other | Admitting: Family Medicine

## 2020-06-09 ENCOUNTER — Ambulatory Visit (INDEPENDENT_AMBULATORY_CARE_PROVIDER_SITE_OTHER): Payer: Medicaid Other | Admitting: Family Medicine

## 2020-06-09 ENCOUNTER — Encounter: Payer: Self-pay | Admitting: Family Medicine

## 2020-06-09 ENCOUNTER — Other Ambulatory Visit: Payer: Self-pay

## 2020-06-09 VITALS — BP 126/77 | HR 69 | Ht 64.0 in | Wt 227.0 lb

## 2020-06-09 DIAGNOSIS — G43009 Migraine without aura, not intractable, without status migrainosus: Secondary | ICD-10-CM

## 2020-06-09 DIAGNOSIS — E118 Type 2 diabetes mellitus with unspecified complications: Secondary | ICD-10-CM | POA: Diagnosis not present

## 2020-06-09 LAB — HM PAP SMEAR: HM Pap smear: NEGATIVE

## 2020-06-09 LAB — POCT GLYCOSYLATED HEMOGLOBIN (HGB A1C): Hemoglobin A1C: 6 % — AB (ref 4.0–5.6)

## 2020-06-09 NOTE — Assessment & Plan Note (Signed)
Super excited to see her A1c down to 6.0 which is fantastic.  She has been using some of the Ozempic but not consistently and she is no longer on the metformin.

## 2020-06-09 NOTE — Assessment & Plan Note (Signed)
Overall she is actually been doing really well on the Aimovig but she has missed her last couple of injections because she has to get her husband to give it to her and he is on the road a lot.  We did discuss that she is welcome to come in and make a nurse appointment so that we can help her get her injection it has really been a big game changer for her in regards to her migraines.

## 2020-06-09 NOTE — Progress Notes (Signed)
Established Patient Office Visit  Subjective:  Patient ID: Jamie Mccarthy, female    DOB: April 18, 1975  Age: 45 y.o. MRN: [id number]  CC:  Chief Complaint  Patient presents with  . Diabetes    HPI Jamie Mccarthy presents for   Diabetes - no hypoglycemic events. No wounds or sores that are not healing well. No increased thirst or urination. Checking glucose at home. Taking medications as prescribed without any side effects. He is doing well in general just really struggling with fatigue.  Since her weight loss surgery she is down about 7 pounds over the last 3 weeks she has been working out 3 to 4 days/week just doing particularly Pilates, yoga and more moderate intensity resistance exercises.  She has been taking her iron and her vitamin D as well as a probiotic she has been trying to get adequate sleep.  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  She snores occasionally but not nightly.  Past Medical History:  Diagnosis Date  . Anemia    Has sickle cell trait  . Anxiety   . Depression   . GERD (gastroesophageal reflux disease)    diet controlled  . Gestational diabetes   . H/O vaginal delivery 2010  . Headache(784.0)    Migraines  . Herpes 2007   hx of  . Iron deficiency anemia, unspecified 10/20/2012  . Neuromuscular disorder ([place])   . Obese   . PONV (postoperative nausea and vomiting)    has had PONV with previous c/s and also lap band surgery 2004  . Raynaud's disease   . Seasonal allergies     Past Surgical History:  Procedure Laterality Date  . BILATERAL SALPINGECTOMY  07/16/2012   Procedure: BILATERAL DISTAL SALPINGECTOMY;  Surgeon: Logan Bores, MD;  Location: [facility];  Service: Obstetrics;;  . CESAREAN SECTION  2012  . CESAREAN SECTION N/A 07/16/2012   Procedure: REPEAT CESAREAN SECTION;  Surgeon: Logan Bores, MD;  Location: [facility];  Service: Obstetrics;  Laterality: N/A;  . CHOLECYSTECTOMY N/A 11/14/2012   Procedure: LAPAROSCOPIC CHOLECYSTECTOMY;   Surgeon: Gayland Curry, MD;  Location: [facility];  Service: General;  Laterality: N/A;  . GASTRIC BANDING PORT REVISION N/A 11/14/2012   Procedure: GASTRIC BANDING PORT REVISION;  Surgeon: Gayland Curry, MD;  Location: [facility];  Service: General;  Laterality: N/A;  PORT PLACEMENT MOVED NEW MESH INSERTED   . GASTRIC ROUX-EN-Y N/A 12/21/2019   Procedure: LAPAROSCOPIC ROUX-EN-Y GASTRIC BYPASS;  Surgeon: [name], MD;  Location: [facility];  Service: General;  Laterality: N/A;  . LAPAROSCOPIC GASTRIC BANDING  2005  . LAPAROSCOPIC VAGINAL HYSTERECTOMY WITH SALPINGECTOMY Bilateral 10/12/2015   Procedure: HYSTERECTOMY TOTAL LAPAROSCOPIC BILATERAL SALPINGECTOMY;  Surgeon: Sherlyn Hay, DO;  Location: [facility];  Service: Gynecology;  Laterality: Bilateral;  . reposition gastric band  2006   x2  . TUBAL LIGATION  2014  . UPPER GI ENDOSCOPY N/A 12/21/2019   Procedure: UPPER GI ENDOSCOPY;  Surgeon: [name], MD;  Location: [facility];  Service: General;  Laterality: N/A;  . WISDOM TOOTH EXTRACTION  1999    Family History  Problem Relation Age of Onset  . Hypertension Mother   . Diabetes Other        father's family  . Thyroid cancer Other   . Breast cancer Maternal Grandmother     Social History   Socioeconomic History  . Marital status: Married    Spouse name: Not on file  . Number of children:  Not on file  . Years of education: Not on file  . Highest education level: Not on file  Occupational History  . Occupation: Theatre manager: [employer]  Tobacco Use  . Smoking status: Never Smoker  . Smokeless tobacco: Never Used  Vaping Use  . Vaping Use: Never used  Substance and Sexual Activity  . Alcohol use: Yes    Comment: occ  . Drug use: No  . Sexual activity: Yes    Comment: separated, 2 caffeine drinks daily.   Other Topics Concern  . Not on file  Social History Narrative   2 caffeine drinks per day. Stay at home mom. Does yoga and pilates.    Social  Determinants of Health   Financial Resource Strain: Not on file  Food Insecurity: Not on file  Transportation Needs: Not on file  Physical Activity: Not on file  Stress: Not on file  Social Connections: Not on file  Intimate Partner Violence: Not on file    Outpatient Medications Prior to Visit  Medication Sig Dispense Refill  . AIMOVIG 70 MG/ML SOAJ INJECT 70MG INTO THE SKIN EVERY 30 DAYS 1 mL 6  . B Complex-C (B-COMPLEX WITH VITAMIN C) tablet Take 1 tablet by mouth daily.    . Betamethasone Valerate 0.12 % foam Apply 1 application topically 2 (two) times daily. To scalp 100 g 0  . Clobetasol Propionate 0.05 % shampoo Wash scalp once or twice weekly with shampoo 118 mL 3  . Ferrous Sulfate (IRON PO) Take 1 tablet by mouth in the morning and at bedtime.    . fluconazole (DIFLUCAN) 200 MG tablet Take 1 tablet (227m) by mouth every 3 days for 3 doses ( 6 day span) then take 1 tablet by mouth once a week. 6 tablet 3  . Fluocinolone Acetonide 0.01 % OIL Place 3 drops into both ears at bedtime. 20 mL 1  . Lancets Misc. KIT Contour Next lancets, patient preference. For testing blood sugars up to twice daily. Dx:E11.65 1 kit 0  . levocetirizine (XYZAL) 5 MG tablet Take 1 tablet (5 mg total) by mouth every evening. 90 tablet 3  . Probiotic Product (PROBIOTIC PO) Take 1 tablet by mouth daily.    . SUMAtriptan (IMITREX) 100 MG tablet TAKE 1 TABLET BY MOUTH EVERY 2HRS AS NEEDED FOR MIGRAINE. (MAY REPEAT IN 2HRS IF HEADACHE PERSIST) (Patient taking differently: Take 100 mg by mouth every 2 (two) hours as needed for migraine. TAKE 1 TABLET BY MOUTH EVERY 2HRS AS NEEDED FOR MIGRAINE. (MAY REPEAT IN 2HRS IF HEADACHE PERSIST)) 10 tablet 2  . Vitamin D, Ergocalciferol, (DRISDOL) 1.25 MG (50000 UNIT) CAPS capsule Take 1 capsule (50,000 Units total) by mouth once a week. 5 capsule 1  . gabapentin (NEURONTIN) 100 MG capsule Take 2 capsules (200 mg total) by mouth every 12 (twelve) hours. 20 capsule 0  .  pantoprazole (PROTONIX) 40 MG tablet Take 1 tablet (40 mg total) by mouth daily. 90 tablet 0  . sertraline (ZOLOFT) 50 MG tablet Take 1 tablet (50 mg total) by mouth daily. 30 tablet 3   No facility-administered medications prior to visit.    Allergies  Allergen Reactions  . Latex Itching    Pt states she gets severely itchy with latex  . Metformin And Related Diarrhea  . Naproxen Rash    ROS Review of Systems    Objective:    Physical Exam Constitutional:      Appearance: She is well-developed.  HENT:     Head: Normocephalic and atraumatic.  Cardiovascular:     Rate and Rhythm: Normal rate and regular rhythm.     Heart sounds: Normal heart sounds.  Pulmonary:     Effort: Pulmonary effort is normal.     Breath sounds: Normal breath sounds.  Skin:    General: Skin is warm and dry.  Neurological:     Mental Status: She is alert and oriented to person, place, and time.  Psychiatric:        Behavior: Behavior normal.     BP 126/77   Pulse 69   Ht 5' 4" (1.626 m)   Wt 227 lb (103 kg)   LMP 08/29/2015 (Approximate)   SpO2 100%   BMI 38.96 kg/m  Wt Readings from Last 3 Encounters:  06/09/20 227 lb (103 kg)  05/19/20 234 lb 1.3 oz (106.2 kg)  03/08/20 236 lb (107 kg)     Health Maintenance Due  Topic Date Due  . FOOT EXAM  Never done  . OPHTHALMOLOGY EXAM  Never done  . URINE MICROALBUMIN  Never done    There are no preventive care reminders to display for this patient.  Lab Results  Component Value Date   TSH 1.04 05/19/2020   Lab Results  Component Value Date   WBC 7.8 05/19/2020   HGB 13.8 05/19/2020   HCT 40.8 05/19/2020   MCV 83.4 05/19/2020   PLT 342 05/19/2020   Lab Results  Component Value Date   NA 140 01/08/2020   K 3.8 01/08/2020   CO2 24 01/08/2020   GLUCOSE 109 (H) 01/08/2020   BUN 7 01/08/2020   CREATININE 0.62 01/08/2020   BILITOT 0.7 01/08/2020   ALKPHOS 62 01/08/2020   AST 19 01/08/2020   ALT 19 01/08/2020   PROT 6.8  01/08/2020   ALBUMIN 3.7 01/08/2020   CALCIUM 9.2 01/08/2020   ANIONGAP 10 01/08/2020   Lab Results  Component Value Date   CHOL 171 07/09/2018   Lab Results  Component Value Date   HDL 57 07/09/2018   Lab Results  Component Value Date   LDLCALC 99 07/09/2018   Lab Results  Component Value Date   TRIG 60 07/09/2018   Lab Results  Component Value Date   CHOLHDL 3.0 07/09/2018   Lab Results  Component Value Date   HGBA1C 6.0 (A) 06/09/2020      Assessment & Plan:   Problem List Items Addressed This Visit      Cardiovascular and Mediastinum   Migraine headache without aura    Overall she is actually been doing really well on the [place] but she has missed her last couple of injections because she has to get her husband to give it to her and he is on the road a lot.  We did discuss that she is welcome to come in and make a nurse appointment so that we can help her get her injection it has really been a big game changer for her in regards to her migraines.        Endocrine   Controlled type 2 diabetes mellitus with complication, without long-term current use of insulin ([place]) - Primary    Super excited to see her A1c down to 6.0 which is fantastic.  She has been using some of the Ozempic but not consistently and she is no longer on the metformin.      Relevant Orders   POCT glycosylated hemoglobin (Hb A1C) (Completed)  No orders of the defined types were placed in this encounter.   Follow-up: Return in about 3 months (around 09/09/2020) for Diabetes follow-up.    Beatrice Lecher, MD

## 2020-06-13 ENCOUNTER — Encounter: Payer: Self-pay | Admitting: Family Medicine

## 2020-06-17 ENCOUNTER — Encounter: Payer: Self-pay | Admitting: Family Medicine

## 2020-06-17 ENCOUNTER — Other Ambulatory Visit: Payer: Self-pay

## 2020-06-17 ENCOUNTER — Ambulatory Visit: Payer: Medicaid Other | Admitting: Family Medicine

## 2020-06-17 VITALS — BP 112/70 | HR 80 | Ht 64.0 in | Wt 229.0 lb

## 2020-06-17 DIAGNOSIS — R6882 Decreased libido: Secondary | ICD-10-CM

## 2020-06-17 DIAGNOSIS — G479 Sleep disorder, unspecified: Secondary | ICD-10-CM | POA: Diagnosis not present

## 2020-06-17 MED ORDER — AMBULATORY NON FORMULARY MEDICATION: 0 refills | Status: AC

## 2020-06-17 NOTE — Assessment & Plan Note (Signed)
Discussed options.  She so far does have some great sleep habits she has a consistent bedtime, wake time, avoids caffeine.  Most of the time the bedroom is dark and cool and quiet except when her husband comes in from work which does cause some disruption.  We did discuss not getting on her phone in the middle the night when she cannot sleep and actually getting out of the bed.  And doing something like reading a book or looking at a magazine and avoiding screen time and then when she feels tired going back to bed so that she does not start to associate anxious feelings with lying in the bed.  We discussed maybe a trial of melatonin starting with about 3 mg and then titrating upward depending on how she feels on the medication.  Monitor for any excess sedation or grogginess during the day.  If she does not tolerate that or its not effective then consider trial of Benadryl, 25 to 50 mg about 30 minutes to an hour before bedtime.  If still not improving then we can consider prescription medication and did have a discussion about different types of options available if needed.

## 2020-06-17 NOTE — Assessment & Plan Note (Signed)
I strongly suspect related to her sleepiness and fatigue I think if we can get her sleeping better and resting better this will help with energy levels.  She is already tried Wellbutrin and it was not helpful.  We could consider a low-dose of a different SSRI.  She does not have any other symptoms such as vaginal dryness etc. that is contributing.  Again also encouraged her to really work on the connection part of her relationship with her husband and to nurture and feed that component which can sometimes increase libido as well.

## 2020-06-17 NOTE — Patient Instructions (Signed)

## 2020-06-17 NOTE — Progress Notes (Signed)
Acute Office Visit  Subjective:    Patient ID: Jamie Mccarthy, female    DOB: [birth year], 45 y.o.   MRN: [id number]  Chief Complaint  Patient presents with  . Insomnia    HPI Patient is in today for not sleeping well.  She says she typically goes to bed right around 845 at the latest maybe 915 or 920.  And then most every morning between 130 to 2:30 AM she wakes up and then cannot go back to sleep until either 330 or 4 AM.  She then has to get up at 5 AM for her kids to get to school.  She says this is been going on for probably a little over a year.  She says maybe once or twice a week she will actually get a good night sleep and not wake up.  Typically she lays in bed after she gets up to pee and gets on her phone until she feels sleepy again.  Her husband is on the road so most nights she is in the bed by herself but occasionally her husband comes in late and likes to watch TV to fall asleep and this is disruptive to her as well.  She complains of dozing off very easily and feeling very sleepy during the day.  She does report snoring if she is really tired but not frequently.  She really does not drink caffeine.  Denies difficulty falling asleep initially.  She also complains of low libido.  She has tried Wellbutrin in the past and says she just felt sedated on it.  No sexual dysfunction.  No vaginal dryness.  No pelvic symptoms    Past Medical History:  Diagnosis Date  . Anemia    Has sickle cell trait  . Anxiety   . Depression   . GERD (gastroesophageal reflux disease)    diet controlled  . Gestational diabetes   . H/O vaginal delivery 2010  . Headache(784.0)    Migraines  . Herpes 2007   hx of  . Iron deficiency anemia, unspecified 10/20/2012  . Neuromuscular disorder ([place])   . Obese   . PONV (postoperative nausea and vomiting)    has had PONV with previous c/s and also lap band surgery 2004  . Raynaud's disease   . Seasonal allergies     Past Surgical History:   Procedure Laterality Date  . BILATERAL SALPINGECTOMY  07/16/2012   Procedure: BILATERAL DISTAL SALPINGECTOMY;  Surgeon: Logan Bores, MD;  Location: [facility];  Service: Obstetrics;;  . CESAREAN SECTION  2012  . CESAREAN SECTION N/A 07/16/2012   Procedure: REPEAT CESAREAN SECTION;  Surgeon: Logan Bores, MD;  Location: [facility];  Service: Obstetrics;  Laterality: N/A;  . CHOLECYSTECTOMY N/A 11/14/2012   Procedure: LAPAROSCOPIC CHOLECYSTECTOMY;  Surgeon: Gayland Curry, MD;  Location: [facility];  Service: General;  Laterality: N/A;  . GASTRIC BANDING PORT REVISION N/A 11/14/2012   Procedure: GASTRIC BANDING PORT REVISION;  Surgeon: Gayland Curry, MD;  Location: [facility];  Service: General;  Laterality: N/A;  PORT PLACEMENT MOVED NEW MESH INSERTED   . GASTRIC ROUX-EN-Y N/A 12/21/2019   Procedure: LAPAROSCOPIC ROUX-EN-Y GASTRIC BYPASS;  Surgeon: [name], MD;  Location: [facility];  Service: General;  Laterality: N/A;  . LAPAROSCOPIC GASTRIC BANDING  2005  . LAPAROSCOPIC VAGINAL HYSTERECTOMY WITH SALPINGECTOMY Bilateral 10/12/2015   Procedure: HYSTERECTOMY TOTAL LAPAROSCOPIC BILATERAL SALPINGECTOMY;  Surgeon: Sherlyn Hay, DO;  Location: [facility];  Service: Gynecology;  Laterality: Bilateral;  .  reposition gastric band  2006   x2  . TUBAL LIGATION  2014  . UPPER GI ENDOSCOPY N/A 12/21/2019   Procedure: UPPER GI ENDOSCOPY;  Surgeon: [name], MD;  Location: [facility];  Service: General;  Laterality: N/A;  . WISDOM TOOTH EXTRACTION  1999    Family History  Problem Relation Age of Onset  . Hypertension Mother   . Diabetes Other        father's family  . Thyroid cancer Other   . Breast cancer Maternal Grandmother     Social History   Socioeconomic History  . Marital status: Married    Spouse name: [name]  . Number of children: Not on file  . Years of education: Not on file  . Highest education level: Not on file  Occupational History  . Occupation: homemaker  Tobacco Use  .  Smoking status: Never Smoker  . Smokeless tobacco: Never Used  Vaping Use  . Vaping Use: Never used  Substance and Sexual Activity  . Alcohol use: Yes    Comment: occ  . Drug use: No  . Sexual activity: Yes    Comment: separated, 2 caffeine drinks daily.   Other Topics Concern  . Not on file  Social History Narrative   2 caffeine drinks per day. Stay at home mom. Does yoga and pilates.    Social Determinants of Health   Financial Resource Strain: Not on file  Food Insecurity: Not on file  Transportation Needs: Not on file  Physical Activity: Not on file  Stress: Not on file  Social Connections: Not on file  Intimate Partner Violence: Not on file    Outpatient Medications Prior to Visit  Medication Sig Dispense Refill  . AIMOVIG 70 MG/ML SOAJ INJECT 70MG INTO THE SKIN EVERY 30 DAYS 1 mL 6  . B Complex-C (B-COMPLEX WITH VITAMIN C) tablet Take 1 tablet by mouth daily.    . Betamethasone Valerate 0.12 % foam Apply 1 application topically 2 (two) times daily. To scalp 100 g 0  . Clobetasol Propionate 0.05 % shampoo Wash scalp once or twice weekly with shampoo 118 mL 3  . Ferrous Sulfate (IRON PO) Take 1 tablet by mouth in the morning and at bedtime.    . fluconazole (DIFLUCAN) 200 MG tablet Take 1 tablet (238m) by mouth every 3 days for 3 doses ( 6 day span) then take 1 tablet by mouth once a week. 6 tablet 3  . Fluocinolone Acetonide 0.01 % OIL Place 3 drops into both ears at bedtime. 20 mL 1  . Lancets Misc. KIT Contour Next lancets, patient preference. For testing blood sugars up to twice daily. Dx:E11.65 1 kit 0  . levocetirizine (XYZAL) 5 MG tablet Take 1 tablet (5 mg total) by mouth every evening. 90 tablet 3  . Probiotic Product (PROBIOTIC PO) Take 1 tablet by mouth daily.    . SUMAtriptan (IMITREX) 100 MG tablet TAKE 1 TABLET BY MOUTH EVERY 2HRS AS NEEDED FOR MIGRAINE. (MAY REPEAT IN 2HRS IF HEADACHE PERSIST) (Patient taking differently: Take 100 mg by mouth every 2 (two)  hours as needed for migraine. TAKE 1 TABLET BY MOUTH EVERY 2HRS AS NEEDED FOR MIGRAINE. (MAY REPEAT IN 2HRS IF HEADACHE PERSIST)) 10 tablet 2  . Vitamin D, Ergocalciferol, (DRISDOL) 1.25 MG (50000 UNIT) CAPS capsule Take 1 capsule (50,000 Units total) by mouth once a week. 5 capsule 1   No facility-administered medications prior to visit.    Allergies  Allergen Reactions  .  Latex Itching    Pt states she gets severely itchy with latex  . Metformin And Related Diarrhea  . Naproxen Rash    Review of Systems     Objective:    Physical Exam  BP 112/70   Pulse 80   Ht _0  (1.626 m)   Wt 229 lb (103.9 kg)   LMP 08/29/2015 (Approximate)   SpO2 98%   BMI 39.31 kg/m  Wt Readings from Last 3 Encounters:  06/17/20 229 lb (103.9 kg)  06/09/20 227 lb (103 kg)  05/19/20 234 lb 1.3 oz (106.2 kg)    Health Maintenance Due  Topic Date Due  . FOOT EXAM  Never done  . OPHTHALMOLOGY EXAM  Never done  . URINE MICROALBUMIN  Never done    There are no preventive care reminders to display for this patient.   Lab Results  Component Value Date   TSH 1.04 05/19/2020   Lab Results  Component Value Date   WBC 7.8 05/19/2020   HGB 13.8 05/19/2020   HCT 40.8 05/19/2020   MCV 83.4 05/19/2020   PLT 342 05/19/2020   Lab Results  Component Value Date   NA 140 01/08/2020   K 3.8 01/08/2020   CO2 24 01/08/2020   GLUCOSE 109 (H) 01/08/2020   BUN 7 01/08/2020   CREATININE 0.62 01/08/2020   BILITOT 0.7 01/08/2020   ALKPHOS 62 01/08/2020   AST 19 01/08/2020   ALT 19 01/08/2020   PROT 6.8 01/08/2020   ALBUMIN 3.7 01/08/2020   CALCIUM 9.2 01/08/2020   ANIONGAP 10 01/08/2020   Lab Results  Component Value Date   CHOL 171 07/09/2018   Lab Results  Component Value Date   HDL 57 07/09/2018   Lab Results  Component Value Date   LDLCALC 99 07/09/2018   Lab Results  Component Value Date   TRIG 60 07/09/2018   Lab Results  Component Value Date   CHOLHDL 3.0 07/09/2018    Lab Results  Component Value Date   HGBA1C 6.0 (A) 06/09/2020       Assessment & Plan:   Problem List Items Addressed This Visit      Other   Sleep disturbance - Primary    Discussed options.  She so far does have some great sleep habits she has a consistent bedtime, wake time, avoids caffeine.  Most of the time the bedroom is dark and cool and quiet except when her husband comes in from work which does cause some disruption.  We did discuss not getting on her phone in the middle the night when she cannot sleep and actually getting out of the bed.  And doing something like reading a book or looking at a magazine and avoiding screen time and then when she feels tired going back to bed so that she does not start to associate anxious feelings with lying in the bed.  We discussed maybe a trial of melatonin starting with about 3 mg and then titrating upward depending on how she feels on the medication.  Monitor for any excess sedation or grogginess during the day.  If she does not tolerate that or its not effective then consider trial of Benadryl, 25 to 50 mg about 30 minutes to an hour before bedtime.  If still not improving then we can consider prescription medication and did have a discussion about different types of options available if needed.      Low libido    I strongly suspect related to  her sleepiness and fatigue I think if we can get her sleeping better and resting better this will help with energy levels.  She is already tried Wellbutrin and it was not helpful.  We could consider a low-dose of a different SSRI.  She does not have any other symptoms such as vaginal dryness etc. that is contributing.  Again also encouraged her to really work on the connection part of her relationship with her husband and to nurture and feed that component which can sometimes increase libido as well.        She would also like medical clearance for ultrasonic cavitation therapy.  It is a body contouring  treatment to remove fat deposits under the skin.  Because of her history of diabetes they wanted her to have medical clearance.  Order provided.   Meds ordered this encounter  Medications  . AMBULATORY NON FORMULARY MEDICATION    Sig: Medication Name: Ultrasonic cavitation therapy. Cleared medically for use of this therapy.    Dispense:  1 each    Refill:  0     Beatrice Lecher, MD

## 2020-06-23 ENCOUNTER — Encounter: Payer: Self-pay | Admitting: Family Medicine

## 2020-06-24 ENCOUNTER — Encounter: Payer: Self-pay | Admitting: Family Medicine

## 2020-06-24 NOTE — Telephone Encounter (Signed)
OK for note for school

## 2020-06-28 ENCOUNTER — Encounter: Payer: Self-pay | Admitting: Family Medicine

## 2020-06-28 NOTE — Progress Notes (Unsigned)
Negative for intraepithelial lesion or malignancy.  

## 2020-07-01 ENCOUNTER — Encounter: Payer: Self-pay | Admitting: Family Medicine

## 2020-07-01 NOTE — Telephone Encounter (Signed)
OK to extend note.  Anything beyond the new date will require an OV

## 2020-07-05 ENCOUNTER — Encounter: Payer: BC Managed Care – PPO | Attending: General Surgery | Admitting: Skilled Nursing Facility1

## 2020-07-05 ENCOUNTER — Other Ambulatory Visit: Payer: Self-pay

## 2020-07-05 DIAGNOSIS — E118 Type 2 diabetes mellitus with unspecified complications: Secondary | ICD-10-CM | POA: Diagnosis not present

## 2020-07-11 NOTE — Progress Notes (Signed)
Follow-up visit:  Post-Operative RYGB Surgery  Medical Nutrition Therapy:  Appt start time: 6:00pm end time:  7:00pm  Primary concerns today: Post-operative Bariatric Surgery Nutrition Management 6 Month Post-Op Class  Anthropometrics  Start weight at [facility]: 266 lbs Weight: pt declined  Surgery type: RYGB Surgery date: 12/21/2019    Information Reviewed/ Discussed During Appointment: -Review of composition scale numbers -Fluid requirements (64-100 ounces) -Protein requirements (60-80g) -Strategies for tolerating diet -Advancement of diet to include Starchy vegetables -Barriers to inclusion of new foods -Inclusion of appropriate multivitamin and calcium supplements  -Exercise recommendations   Fluid intake: adequate   Medications: See List Supplementation: appropriate    Using straws: no Drinking while eating: no Having you been chewing well: yes Chewing/swallowing difficulties: no Changes in vision: no Changes to mood/headaches: no Hair loss/Cahnges to skin/Changes to nails: no Any difficulty focusing or concentrating: no Sweating: no Dizziness/Lightheaded: no Palpitations: no  Carbonated beverages: no N/V/D/C/GAS: no Abdominal Pain: no Dumping syndrome: no  Recent physical activity:  ADL's  Progress Towards Goal(s):  In Progress Teaching method utilized: Visual & Auditory  Demonstrated degree of understanding via: Teach Back  Readiness Level: Action Barriers to learning/adherence to lifestyle change: none identified  Handouts given during visit include: Phase V diet Progression  Goals Sheet The Benefits of Exercise are endless..... Support Group Topics   Teaching Method Utilized:  Visual Auditory Hands on  Demonstrated degree of understanding via:  Teach Back   Monitoring/Evaluation:  Dietary intake, exercise, and body weight. Follow up in 3 months for 9 month post-op visit.

## 2020-08-03 ENCOUNTER — Telehealth: Payer: Self-pay

## 2020-08-03 NOTE — Telephone Encounter (Signed)
Fax received from pharmacy indicating a PA was needed for Aimovig 70mg /ml. Using covermymeds, a PA was completed and submitted; awaiting response.

## 2020-08-29 ENCOUNTER — Encounter: Payer: Self-pay | Admitting: Family Medicine

## 2020-08-29 ENCOUNTER — Other Ambulatory Visit: Payer: Self-pay

## 2020-08-29 ENCOUNTER — Telehealth (INDEPENDENT_AMBULATORY_CARE_PROVIDER_SITE_OTHER): Payer: BC Managed Care – PPO | Admitting: Family Medicine

## 2020-08-29 DIAGNOSIS — U071 COVID-19: Secondary | ICD-10-CM | POA: Diagnosis not present

## 2020-08-29 NOTE — Progress Notes (Signed)
Patient reports symptom of Sore throat and a coworker's child who was positive. She reported starting today with a slight cough and feeling achy with yellow phlegm and itchy ears. Denies fever, nausea/vomiting. She tested yesterday with a test that expired yesterday but the results were positive.

## 2020-08-29 NOTE — Progress Notes (Signed)
Virtual Video Visit via MyChart Note  I connected with  Jamie Mccarthy on 08/29/20 at  8:50 AM EDT by the video enabled telemedicine application for MyChart, and verified that I am speaking with the correct person using two identifiers.   I introduced myself as a Designer, jewellery with the practice. We discussed the limitations of evaluation and management by telemedicine and the availability of in person appointments. The patient expressed understanding and agreed to proceed.  Participating parties in this visit include: The patient and the nurse practitioner listed.  The patient is: At home I am: In the office - Primary Care Jamie Mccarthy  Subjective:    CC:  Chief Complaint  Patient presents with   Covid Positive     HPI: Jamie Mccarthy is a 45 y.o. year old female presenting today via [place] today for positive COVID test.  Last Friday, patient started to have a sore throat while at work. She initially thought it was allergies and started taking her OTC medications. She then noticed some mild congestion, PND, cough, and mild muscle aches which she initially thought were from a hard workout she did last week. Someone came into her work Friday afternoon stating their child had COVID, so when her symptoms continued over the weekend, she decided to take a test yesterday. Test was positive. Reports she is feeling pretty good overall, worst symptom is the sore throat. She denies any fevers, chest pain, dyspnea, wheezing, lethargy, dehydration, loss of appetite, loss of taste/smell. She is fully vaccinated + one booster.     Past medical history, Surgical history, Family history not pertinant except as noted below, Social history, Allergies, and medications have been entered into the medical record, reviewed, and corrections made.   Review of Systems:  All review of systems negative except what is listed in the HPI   Objective:    General:  Speaking clearly in complete  sentences. Absent shortness of breath noted.   Alert and oriented x3.   Normal judgment.  Absent acute distress.   Impression and Recommendations:    1. COVID-19 Feeling very well overall. Recommend she continue supportive measures including rest, hydration, humidifier use, cough drops, warm liquids with honey and lemon, OTC cough/cold/analgesics. Handout attached to AVS with OTC remedies and quarantine guidelines. Patient aware of signs/symptoms requiring further/urgent evaluation.   Follow-up if symptoms worsen or fail to improve.    I discussed the assessment and treatment plan with the patient. The patient was provided an opportunity to ask questions and all were answered. The patient agreed with the plan and demonstrated an understanding of the instructions.   The patient was advised to call back or seek an in-person evaluation if the symptoms worsen or if the condition fails to improve as anticipated.  I spent 20 minutes dedicated to the care of this patient on the date of this encounter to include pre-visit chart review of prior notes and results, face-to-face time with the patient, and post-visit ordering of testing as indicated.   Purcell Nails Olevia Bowens, DNP, FNP-C

## 2020-08-29 NOTE — Patient Instructions (Signed)
Over the counter medications that may be helpful for symptoms:  Guaifenesin 1200 mg extended release tabs twice daily, with plenty of water For cough and congestion Brand name: Mucinex   Pseudoephedrine 30 mg, one or two tabs every 4 to 6 hours For sinus congestion Brand name: Sudafed You must get this from the pharmacy counter.  Oxymetazoline nasal spray each morning, one spray in each nostril, for NO MORE THAN 3 days  For nasal and sinus congestion Brand name: Afrin Saline nasal spray or Saline Nasal Irrigation 3-5 times a day For nasal and sinus congestion Brand names: Ocean or AYR Fluticasone nasal spray, one spray in each nostril, each morning (after oxymetazoline and saline, if used) For nasal and sinus congestion Brand name: Flonase Warm salt water gargles  For sore throat Every few hours as needed Alternate ibuprofen 400-600 mg and acetaminophen 1000 mg every 4-6 hours For fever, body aches, headache Brand names: Motrin or Advil and Tylenol Dextromethorphan 12-hour cough version 30 mg every 12 hours  For cough Brand name: Delsym Stop all other cold medications for now (Nyquil, Dayquil, Tylenol Cold, Theraflu, etc) and other non-prescription cough/cold preparations. Many of these have the same ingredients listed above and could cause an overdose of medication.   Herbal treatments that have been shown to be helpful in some patients include: Vitamin C '1000mg'$  per day Vitamin D 4000iU per day Zinc '100mg'$  per day Quercetin 25-'500mg'$  twice a day Melatonin 5-'10mg'$  at bedtime  General Instructions Allow your body to rest Drink PLENTY of fluids Isolate yourself from everyone, even family, until test results have returned  If your COVID-19 test is positive Then you ARE INFECTED and you can pass the virus to others You must quarantine from others for a minimum of  5 days since symptoms started AND You are fever free for 24 hours WITHOUT any medication to reduce fever AND Your  symptoms are improving Must wear a mask for the following 5 days If not improved by day 5, then quarantine for the full 10 days. Do not go to the store or other public areas Do not go around household members who are not known to be infected with COVID-19 If you MUST leave your area of quarantine (example: go to a bathroom you share with others in your home), you must Wear a mask Wash your hands thoroughly Wipe down any surfaces you touch Do not share food, drinks, towels, or other items with other persons Dispose of your own tissues, food containers, etc  Once you have recovered, please continue good preventive care measures, including:  wearing a mask when in public wash your hands frequently avoid touching your face/nose/eyes cover coughs/sneezes with the inside of your elbow stay out of crowds keep a 6 foot distance from others  If you develop severe shortness of breath, uncontrolled fevers, coughing up blood, confusion, chest pain, or signs of dehydration (such as significantly decreased urine amounts or dizziness with standing) please go to the ER.

## 2020-09-08 ENCOUNTER — Other Ambulatory Visit: Payer: Self-pay | Admitting: Family Medicine

## 2020-09-08 DIAGNOSIS — R5383 Other fatigue: Secondary | ICD-10-CM

## 2020-09-12 ENCOUNTER — Other Ambulatory Visit: Payer: Self-pay

## 2020-09-12 ENCOUNTER — Ambulatory Visit (INDEPENDENT_AMBULATORY_CARE_PROVIDER_SITE_OTHER): Payer: BC Managed Care – PPO | Admitting: Family Medicine

## 2020-09-12 ENCOUNTER — Encounter: Payer: Self-pay | Admitting: Family Medicine

## 2020-09-12 VITALS — BP 131/85 | HR 71 | Temp 97.6°F | Ht 64.0 in | Wt 223.0 lb

## 2020-09-12 DIAGNOSIS — L219 Seborrheic dermatitis, unspecified: Secondary | ICD-10-CM | POA: Diagnosis not present

## 2020-09-12 DIAGNOSIS — G43009 Migraine without aura, not intractable, without status migrainosus: Secondary | ICD-10-CM

## 2020-09-12 DIAGNOSIS — M25512 Pain in left shoulder: Secondary | ICD-10-CM

## 2020-09-12 DIAGNOSIS — R0683 Snoring: Secondary | ICD-10-CM

## 2020-09-12 DIAGNOSIS — E118 Type 2 diabetes mellitus with unspecified complications: Secondary | ICD-10-CM | POA: Diagnosis not present

## 2020-09-12 LAB — POCT GLYCOSYLATED HEMOGLOBIN (HGB A1C): Hemoglobin A1C: 5.8 % — AB (ref 4.0–5.6)

## 2020-09-12 MED ORDER — KETOCONAZOLE 2 % EX CREA: 1.0000 "application " | TOPICAL_CREAM | Freq: Every day | CUTANEOUS | 0 refills | Status: DC | PRN

## 2020-09-12 NOTE — Progress Notes (Signed)
Established Patient Office Visit  Subjective:  Patient ID: Jamie Mccarthy, female    DOB: 06/13/1975  Age: 45 y.o. MRN: [id number]  CC:  Chief Complaint  Patient presents with   Diabetes   Migraine    HPI Jessye A Kohan presents for   Diabetes - no hypoglycemic events. No wounds or sores that are not healing well. No increased thirst or urination. Checking glucose at home. Taking medications as prescribed without any side effects.  F/U Migraines.  She is actually doing really well on the Aimovig but has noticed that if she forgets to take it the headaches come back with a vengeance in fact she was a little late in taking her shot and ended up having a migraine Thursday Friday and through the weekend she is feeling little bit better today.  For like her seborrheic dermatitis has gotten a little bit worse particularly on her forehead and on the side of her nose particularly on the right side.  Her brother had a stroke at age 21 or 78.  Says that her husband and children have noted that she snores.  She has never been diagnosed with sleep apnea.  No known family history.  Also been having some left shoulder pain she denies any known injury at the gym she does pure bar and sometimes Pilates.  Is just felt sore.  Past Medical History:  Diagnosis Date   Anemia    Has sickle cell trait   Anxiety    Depression    GERD (gastroesophageal reflux disease)    diet controlled   Gestational diabetes    H/O vaginal delivery 2010   Headache(784.0)    Migraines   Herpes 2007   hx of   Iron deficiency anemia, unspecified 10/20/2012   Neuromuscular disorder (HCC)    Obese    PONV (postoperative nausea and vomiting)    has had PONV with previous c/s and also lap band surgery 2004   Raynaud's disease    Seasonal allergies     Past Surgical History:  Procedure Laterality Date   BILATERAL SALPINGECTOMY  07/16/2012   Procedure: BILATERAL DISTAL SALPINGECTOMY;  Surgeon: Logan Bores, MD;  Location: [facility];  Service: Obstetrics;;   CESAREAN SECTION  2012   CESAREAN SECTION N/A 07/16/2012   Procedure: REPEAT CESAREAN SECTION;  Surgeon: Logan Bores, MD;  Location: [facility];  Service: Obstetrics;  Laterality: N/A;   CHOLECYSTECTOMY N/A 11/14/2012   Procedure: LAPAROSCOPIC CHOLECYSTECTOMY;  Surgeon: Gayland Curry, MD;  Location: [facility];  Service: General;  Laterality: N/A;   GASTRIC BANDING PORT REVISION N/A 11/14/2012   Procedure: GASTRIC BANDING PORT REVISION;  Surgeon: Gayland Curry, MD;  Location: [facility];  Service: General;  Laterality: N/A;  PORT PLACEMENT MOVED NEW MESH INSERTED    GASTRIC ROUX-EN-Y N/A 12/21/2019   Procedure: LAPAROSCOPIC ROUX-EN-Y GASTRIC BYPASS;  Surgeon: [name], MD;  Location: [facility];  Service: General;  Laterality: N/A;   LAPAROSCOPIC GASTRIC BANDING  2005   LAPAROSCOPIC VAGINAL HYSTERECTOMY WITH SALPINGECTOMY Bilateral 10/12/2015   Procedure: HYSTERECTOMY TOTAL LAPAROSCOPIC BILATERAL SALPINGECTOMY;  Surgeon: Sherlyn Hay, DO;  Location: [facility];  Service: Gynecology;  Laterality: Bilateral;   reposition gastric band  2006   x2   TUBAL LIGATION  2014   UPPER GI ENDOSCOPY N/A 12/21/2019   Procedure: UPPER GI ENDOSCOPY;  Surgeon: [name], MD;  Location: [facility];  Service: General;  Laterality: N/A;   [place]  Family History  Problem Relation Age of Onset   Hypertension Mother    Diabetes Other        father's family   Thyroid cancer Other    Breast cancer Maternal Grandmother     Social History   Socioeconomic History   Marital status: Married    Spouse name: [name]   Number of children: Not on file   Years of education: Not on file   Highest education level: Not on file  Occupational History   Occupation: homemaker  Tobacco Use   Smoking status: Never   Smokeless tobacco: Never  Vaping Use   Vaping Use: Never used  Substance and Sexual Activity   Alcohol use: Yes     Comment: occ   Drug use: No   Sexual activity: Yes    Comment: separated, 2 caffeine drinks daily.   Other Topics Concern   Not on file  Social History Narrative   2 caffeine drinks per day. Stay at home mom. Does yoga and pilates.    Social Determinants of Health   Financial Resource Strain: Not on file  Food Insecurity: Not on file  Transportation Needs: Not on file  Physical Activity: Not on file  Stress: Not on file  Social Connections: Not on file  Intimate Partner Violence: Not on file    Outpatient Medications Prior to Visit  Medication Sig Dispense Refill   AIMOVIG 70 MG/ML SOAJ INJECT $RemoveBef'70MG'NaqsYmMqXf$  INTO THE SKIN EVERY 30 DAYS 1 mL 6   AMBULATORY NON FORMULARY MEDICATION Medication Name: Ultrasonic cavitation therapy. Cleared medically for use of this therapy. 1 each 0   B Complex-C (B-COMPLEX WITH VITAMIN C) tablet Take 1 tablet by mouth daily.     Betamethasone Valerate 0.12 % foam Apply 1 application topically 2 (two) times daily. To scalp 100 g 0   Clobetasol Propionate 0.05 % shampoo Wash scalp once or twice weekly with shampoo 118 mL 3   Ferrous Sulfate (IRON PO) Take 1 tablet by mouth in the morning and at bedtime.     fluconazole (DIFLUCAN) 200 MG tablet Take 1 tablet ($RemoveB'200mg'uWqQakgw$ ) by mouth every 3 days for 3 doses ( 6 day span) then take 1 tablet by mouth once a week. 6 tablet 3   Fluocinolone Acetonide 0.01 % OIL Place 3 drops into both ears at bedtime. 20 mL 1   Lancets Misc. KIT Contour Next lancets, patient preference. For testing blood sugars up to twice daily. Dx:E11.65 1 kit 0   levocetirizine (XYZAL) 5 MG tablet Take 1 tablet (5 mg total) by mouth every evening. 90 tablet 3   OZEMPIC, 1 MG/DOSE, 4 MG/3ML SOPN Inject 1 mg into the skin once a week.     Probiotic Product (PROBIOTIC PO) Take 1 tablet by mouth daily.     SUMAtriptan (IMITREX) 100 MG tablet TAKE 1 TABLET BY MOUTH EVERY 2HRS AS NEEDED FOR MIGRAINE. (MAY REPEAT IN 2HRS IF HEADACHE PERSIST) (Patient taking  differently: Take 100 mg by mouth every 2 (two) hours as needed for migraine. TAKE 1 TABLET BY MOUTH EVERY 2HRS AS NEEDED FOR MIGRAINE. (MAY REPEAT IN 2HRS IF HEADACHE PERSIST)) 10 tablet 2   Vitamin D, Ergocalciferol, (DRISDOL) 1.25 MG (50000 UNIT) CAPS capsule TAKE 1 CAPSULE BY MOUTH ONE TIME PER WEEK 5 capsule 1   No facility-administered medications prior to visit.    Allergies  Allergen Reactions   Latex Itching    Pt states she gets severely itchy with latex   Metformin  And Related Diarrhea   Naproxen Rash    ROS Review of Systems    Objective:    Physical Exam Constitutional:      Appearance: Normal appearance. She is well-developed.  HENT:     Head: Normocephalic and atraumatic.  Cardiovascular:     Rate and Rhythm: Normal rate and regular rhythm.     Heart sounds: Normal heart sounds.  Pulmonary:     Effort: Pulmonary effort is normal.     Breath sounds: Normal breath sounds.  Skin:    General: Skin is warm and dry.  Neurological:     Mental Status: She is alert and oriented to person, place, and time.  Psychiatric:        Behavior: Behavior normal.    BP 131/85   Pulse 71   Temp 97.6 F (36.4 C)   Ht 5\' 4"  (1.626 m)   Wt 223 lb (101.2 kg)   LMP 08/29/2015 (Approximate)   SpO2 100%   BMI 38.28 kg/m  Wt Readings from Last 3 Encounters:  09/12/20 223 lb (101.2 kg)  06/17/20 229 lb (103.9 kg)  06/09/20 227 lb (103 kg)     Health Maintenance Due  Topic Date Due   PNEUMOCOCCAL POLYSACCHARIDE VACCINE AGE 44-64 HIGH RISK  Never done   Pneumococcal Vaccine 14-18 Years old (1 - PCV) Never done   FOOT EXAM  Never done   OPHTHALMOLOGY EXAM  Never done   URINE MICROALBUMIN  Never done   Hepatitis C Screening  Never done   INFLUENZA VACCINE  08/22/2020    There are no preventive care reminders to display for this patient.  Lab Results  Component Value Date   TSH 1.04 05/19/2020   Lab Results  Component Value Date   WBC 7.8 05/19/2020   HGB 13.8  05/19/2020   HCT 40.8 05/19/2020   MCV 83.4 05/19/2020   PLT 342 05/19/2020   Lab Results  Component Value Date   NA 140 01/08/2020   K 3.8 01/08/2020   CO2 24 01/08/2020   GLUCOSE 109 (H) 01/08/2020   BUN 7 01/08/2020   CREATININE 0.62 01/08/2020   BILITOT 0.7 01/08/2020   ALKPHOS 62 01/08/2020   AST 19 01/08/2020   ALT 19 01/08/2020   PROT 6.8 01/08/2020   ALBUMIN 3.7 01/08/2020   CALCIUM 9.2 01/08/2020   ANIONGAP 10 01/08/2020   Lab Results  Component Value Date   CHOL 171 07/09/2018   Lab Results  Component Value Date   HDL 57 07/09/2018   Lab Results  Component Value Date   LDLCALC 99 07/09/2018   Lab Results  Component Value Date   TRIG 60 07/09/2018   Lab Results  Component Value Date   CHOLHDL 3.0 07/09/2018   Lab Results  Component Value Date   HGBA1C 5.8 (A) 09/12/2020      Assessment & Plan:   Problem List Items Addressed This Visit       Cardiovascular and Mediastinum   Migraine headache without aura    Doing really well to Aimovig.  Continue current regimen.        Endocrine   Controlled type 2 diabetes mellitus with complication, without long-term current use of insulin (HCC) - Primary    A1c looks phenomenal today at 5.8 and she is off of all medications.  She is hoping to get her A1c back under 5.7.  Plan to recheck again in 4 to 6 months.      Relevant Medications  OZEMPIC, 1 MG/DOSE, 4 MG/3ML SOPN   Other Relevant Orders   POCT glycosylated hemoglobin (Hb A1C) (Completed)     Musculoskeletal and Integument   Seborrheic dermatitis of scalp    Also areas affected on the forehead and around the nose and nasolabial folds.  We will treat with ketoconazole cream.      Relevant Medications   ketoconazole (NIZORAL) 2 % cream   Other Visit Diagnoses     Acute pain of left shoulder       Snoring          Snoring-we will have her complete a stop bang today to evaluate for the possibility of sleep apnea.  Stop bang  questionnaire score of 2.  So just mild.  Could consider home sleep study for further evaluation if symptoms change or progress or she develops new symptoms.  Left shoulder pain-likely musculoskeletal strain.  No specific trauma or injury she does have full range of motion and is just painful with full extension.  Recommend ice gentle range of motion and stretching.  If not improving over the next week then recommend follow-up with Dr. Dianah Field or sports medicine   Meds ordered this encounter  Medications   ketoconazole (NIZORAL) 2 % cream    Sig: Apply 1 application topically daily as needed for irritation.    Dispense:  15 g    Refill:  0     Follow-up: Return in about 20 weeks (around 01/30/2021) for Diabetes follow-up.    Beatrice Lecher, MD

## 2020-09-12 NOTE — Assessment & Plan Note (Addendum)
Doing really well to [place].  Continue current regimen.

## 2020-09-12 NOTE — Assessment & Plan Note (Signed)
Also areas affected on the forehead and around the nose and nasolabial folds.  We will treat with ketoconazole cream.

## 2020-09-12 NOTE — Assessment & Plan Note (Signed)
A1c looks phenomenal today at 5.8 and she is off of all medications.  She is hoping to get her A1c back under 5.7.  Plan to recheck again in 4 to 6 months.

## 2020-09-17 ENCOUNTER — Other Ambulatory Visit: Payer: Self-pay | Admitting: Nurse Practitioner

## 2020-09-17 ENCOUNTER — Other Ambulatory Visit: Payer: Self-pay | Admitting: Family Medicine

## 2020-09-17 DIAGNOSIS — L509 Urticaria, unspecified: Secondary | ICD-10-CM

## 2020-09-17 DIAGNOSIS — L299 Pruritus, unspecified: Secondary | ICD-10-CM

## 2020-10-20 ENCOUNTER — Other Ambulatory Visit: Payer: Self-pay | Admitting: Family Medicine

## 2020-10-20 DIAGNOSIS — E1165 Type 2 diabetes mellitus with hyperglycemia: Secondary | ICD-10-CM

## 2020-11-04 ENCOUNTER — Telehealth: Payer: Self-pay

## 2020-11-04 NOTE — Telephone Encounter (Signed)
Medication: AIMOVIG 70 MG/ML SOAJ Prior authorization submitted via CoverMyMeds on 11/04/2020 [place] submission pending

## 2020-11-14 ENCOUNTER — Ambulatory Visit (INDEPENDENT_AMBULATORY_CARE_PROVIDER_SITE_OTHER): Payer: BC Managed Care – PPO

## 2020-11-14 ENCOUNTER — Other Ambulatory Visit: Payer: Self-pay

## 2020-11-14 ENCOUNTER — Ambulatory Visit (INDEPENDENT_AMBULATORY_CARE_PROVIDER_SITE_OTHER): Payer: BC Managed Care – PPO | Admitting: Sports Medicine

## 2020-11-14 DIAGNOSIS — M1712 Unilateral primary osteoarthritis, left knee: Secondary | ICD-10-CM | POA: Diagnosis not present

## 2020-11-14 DIAGNOSIS — S83207D Unspecified tear of unspecified meniscus, current injury, left knee, subsequent encounter: Secondary | ICD-10-CM | POA: Diagnosis not present

## 2020-11-14 NOTE — Assessment & Plan Note (Addendum)
Osteoarthritis and meniscal tearing left knee based on MRI from a year ago. Was doing well after an injection over a year ago, was standing and felt a pop, then having some locking. Aspiration and injection today, return to see me in 2 weeks, referral for arthroscopy if not better. Of note because things popped up rapidly, she had meat, seafood, and alcohol the night before, and aspirate was cloudy we are going to send off for crystal analysis.  This is a chronic process with exacerbation and pharmacologic intervention.  Update 11/16/2020, the knee continues to have significant discomfort, locking, catching, buckling, positive McMurray's sign, this is in spite of the aspiration and injection. Proceeding with MRI for surgical planning.

## 2020-11-14 NOTE — Progress Notes (Addendum)
    Procedures performed today:    Procedure: Real-time Ultrasound Guided aspiration/injection of left knee Device: Samsung HS60  Verbal informed consent obtained.  Time-out conducted.  Noted no overlying erythema, induration, or other signs of local infection.  Skin prepped in a sterile fashion.  Local anesthesia: Topical Ethyl chloride.  With sterile technique and under real time ultrasound guidance: Noted effusion, aspirated 80 mL of cloudy, straw-colored fluid, syringe switched and 1 cc Kenalog 40, 2 cc lidocaine, 2 cc bupivacaine injected easily Completed without difficulty  Advised to call if fevers/chills, erythema, induration, drainage, or persistent bleeding.  Images permanently stored and available for review in PACS.  Impression: Technically successful ultrasound guided aspiration/injection.  Independent interpretation of notes and tests performed by another provider:   None.  Brief History, Exam, Impression, and Recommendations:    Acute meniscal tear of left knee Osteoarthritis and meniscal tearing left knee based on MRI from a year ago. Was doing well after an injection over a year ago, was standing and felt a pop, then having some locking. Aspiration and injection today, return to see me in 2 weeks, referral for arthroscopy if not better. Of note because things popped up rapidly, she had meat, seafood, and alcohol the night before, and aspirate was cloudy we are going to send off for crystal analysis.  This is a chronic process with exacerbation and pharmacologic intervention.  Update 11/16/2020, the knee continues to have significant discomfort, locking, catching, buckling, positive McMurray's sign, this is in spite of the aspiration and injection. Proceeding with MRI for surgical planning.    ___________________________________________ Gwen Her. Dianah Field, M.D., ABFM., CAQSM. Primary Care and [place]  Instructor of [place] [employer] [facility] [employer]

## 2020-11-15 LAB — CELL COUNT AND DIFF, FLUID, OTHER
Basophils, %: 0 %
Eosinophils, %: 0 %
Lymphocytes, %: 0 %
Mesothelial, %: 0 %
Monocyte/Macrophage %: 13 %
Neutrophils, %: 87 %
Total Nucleated Cell Ct: 8510 cells/uL

## 2020-11-15 LAB — SYNOVIAL FLUID, CRYSTAL

## 2020-11-16 NOTE — Addendum Note (Signed)
Addended by: Silverio Decamp on: 11/16/2020 01:20 PM   Modules accepted: Orders

## 2020-11-16 NOTE — Addendum Note (Signed)
Addended by: Silverio Decamp on: 11/16/2020 09:54 AM   Modules accepted: Orders

## 2020-11-20 ENCOUNTER — Ambulatory Visit (INDEPENDENT_AMBULATORY_CARE_PROVIDER_SITE_OTHER): Payer: BC Managed Care – PPO

## 2020-11-20 ENCOUNTER — Other Ambulatory Visit: Payer: Self-pay

## 2020-11-20 DIAGNOSIS — S83232A Complex tear of medial meniscus, current injury, left knee, initial encounter: Secondary | ICD-10-CM | POA: Diagnosis not present

## 2020-11-20 DIAGNOSIS — M1712 Unilateral primary osteoarthritis, left knee: Secondary | ICD-10-CM | POA: Diagnosis not present

## 2020-11-20 DIAGNOSIS — S83207D Unspecified tear of unspecified meniscus, current injury, left knee, subsequent encounter: Secondary | ICD-10-CM

## 2020-11-20 IMAGING — MR MR KNEE*L* W/O CM
6 of 9 series · 27 of 40 positions shown · non-contrast
Comparison: MRI knee [DATE]

CLINICAL DATA: Meniscal tear, untreated, new symptoms Worsening
pain in spite of aspiration and injection, surgical planning

EXAM:
MRI OF THE LEFT KNEE WITHOUT CONTRAST
TECHNIQUE: Multiplanar, multisequence MR imaging of the knee was performed. No
intravenous contrast was administered.

[Series 3: T2 fat-sat · axial · 4.0mm · 0.50mm/px · z∈[-115,+50]mm · 6 of 34 slices shown (1 of 2)]
[im 1/34]
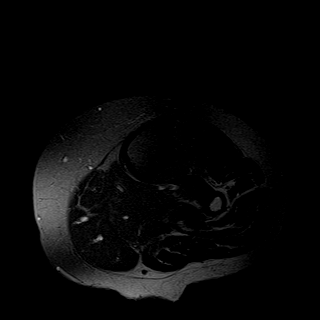
[im 7/34]
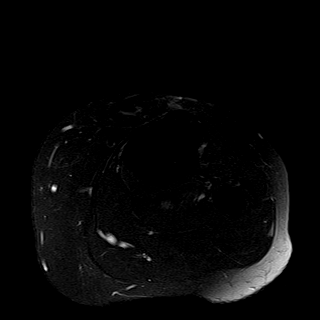
[im 14/34]
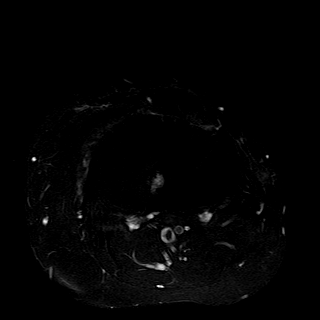
[im 20/34]
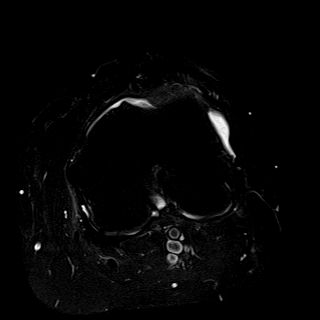
[im 27/34]
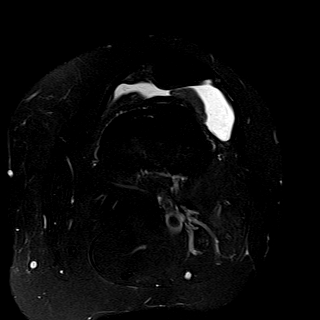
[im 34/34]
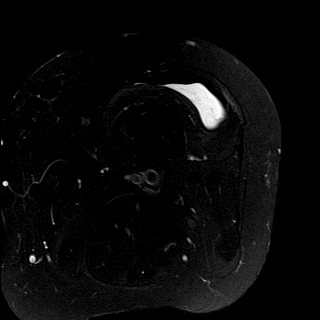

[Series 5: T2 fat-sat · axial · 4.0mm · 0.50mm/px · z∈[-115,+50]mm · 5 of 34 slices shown (2 of 2)]
[im 1/34]
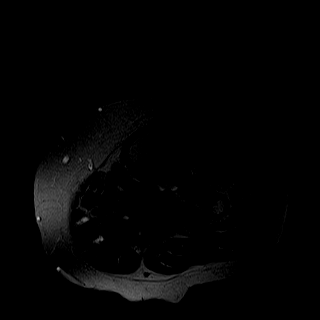
[im 9/34]
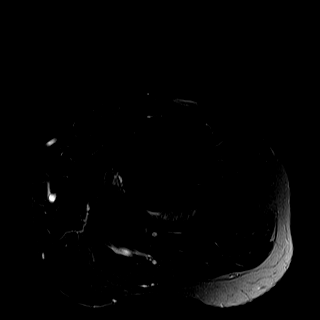
[im 17/34]
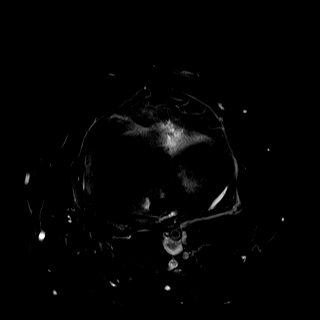
[im 25/34]
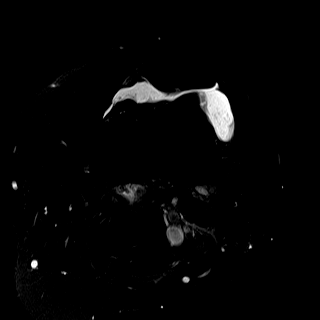
[im 34/34]
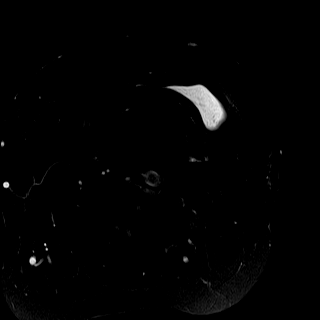

[Series 7: PD fat-sat · coronal · 4.0mm · 0.29mm/px · 4 of 26 slices shown (1 of 4)]
[im 1/26]
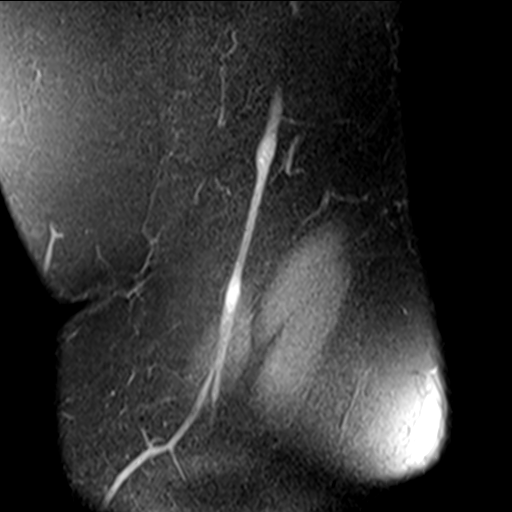
[im 9/26]
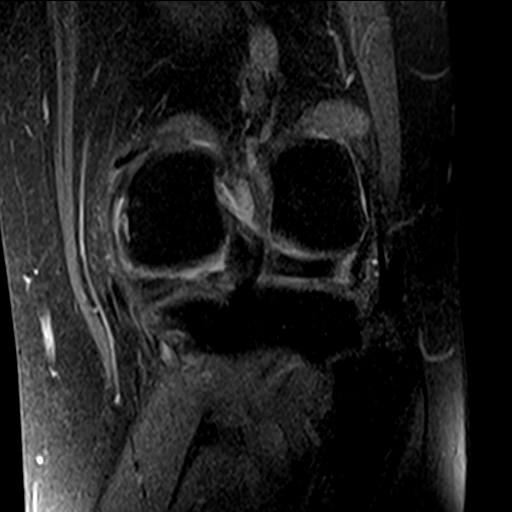
[im 17/26]
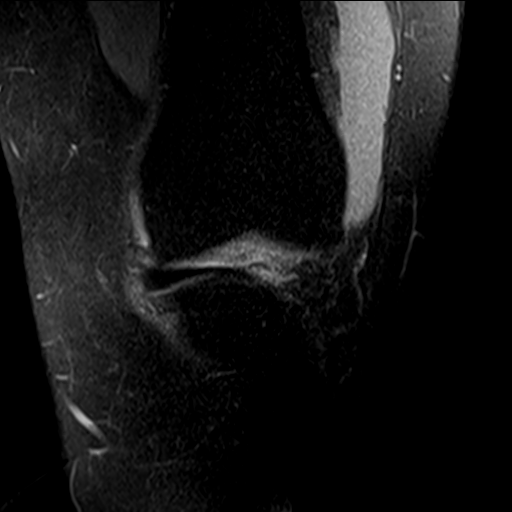
[im 26/26]
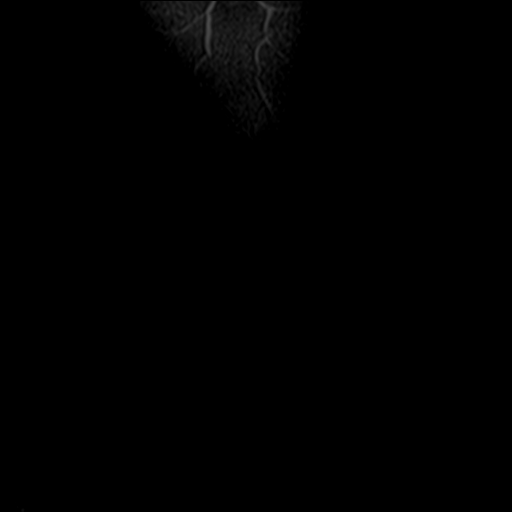

[Series 8: PD fat-sat · sagittal · 3.0mm · 0.29mm/px · 5 of 30 slices shown (2 of 4)]
[im 1/30]
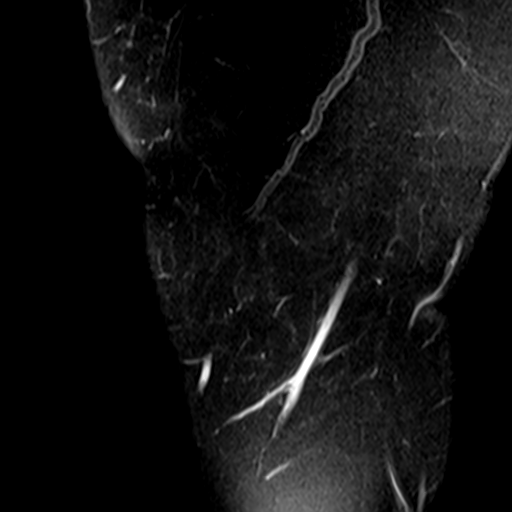
[im 8/30]
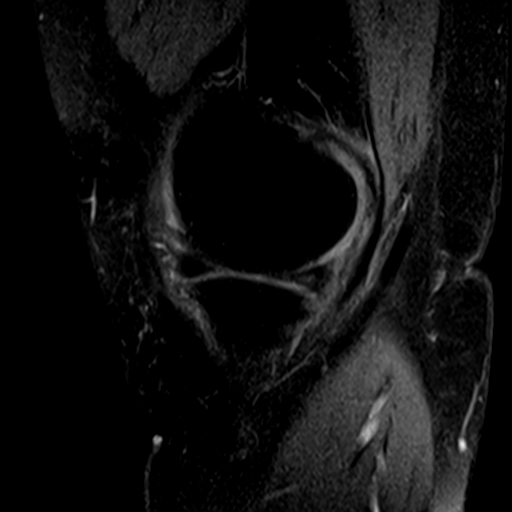
[im 15/30]
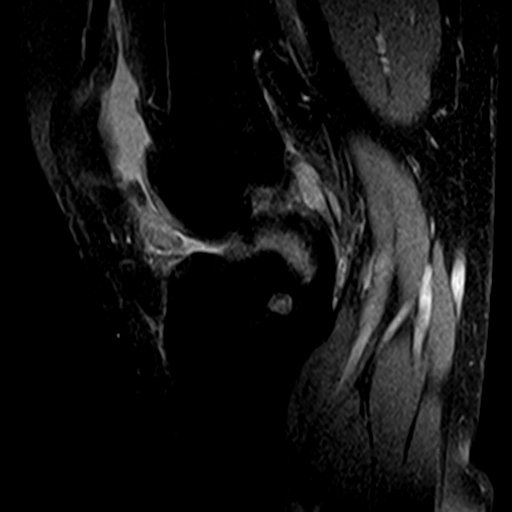
[im 22/30]
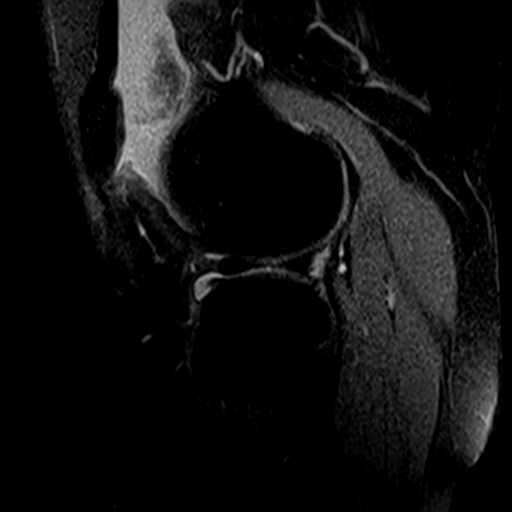
[im 30/30]
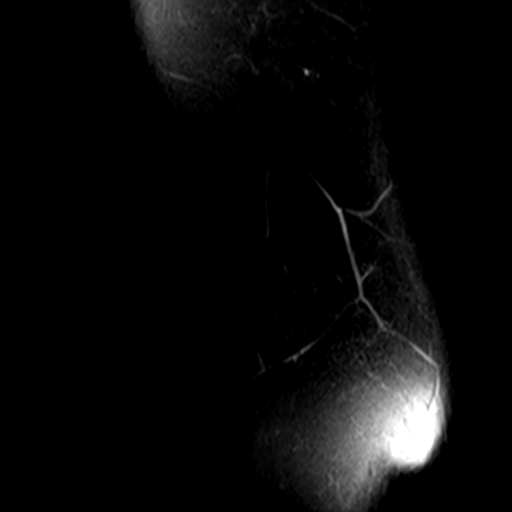

[Series 10: PD fat-sat · coronal · 2.0mm · 0.59mm/px · 3 of 21 slices shown (3 of 4)]
[im 1/21]
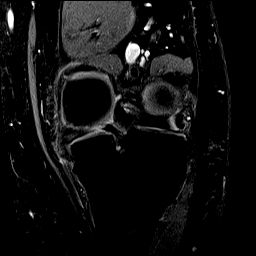
[im 11/21]
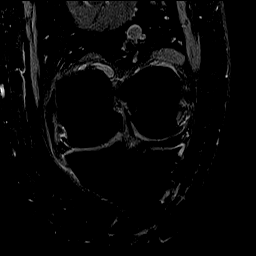
[im 21/21]
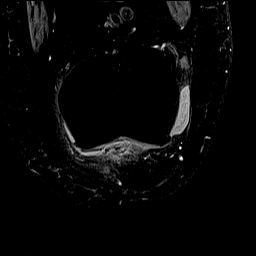

[Series 11: PD fat-sat · coronal · 4.0mm · 0.29mm/px · 4 of 26 slices shown (4 of 4)]
[im 1/26]
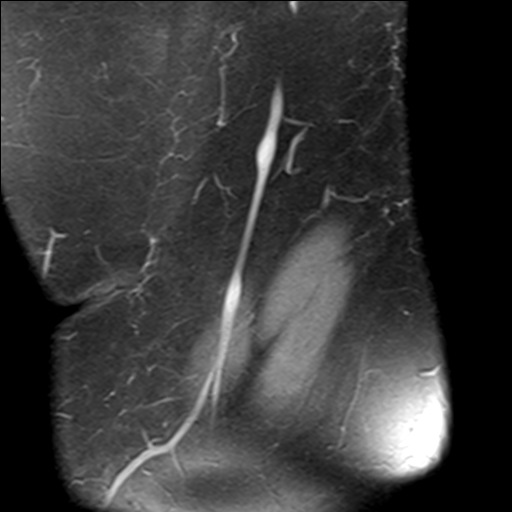
[im 9/26]
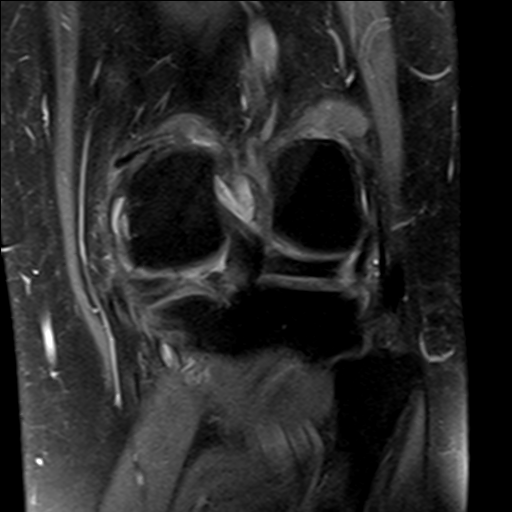
[im 17/26]
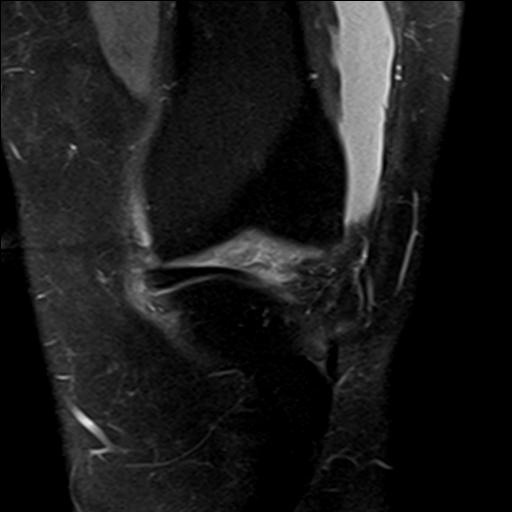
[im 26/26]
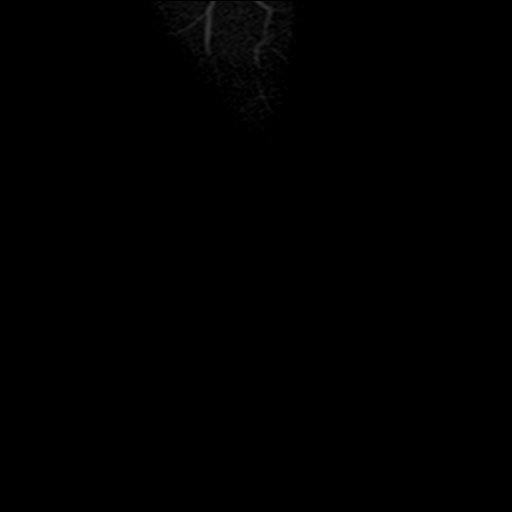

[27 of 40 positions shown; findings below may reference images not displayed]

FINDINGS: MENISCI

Medial: There is a complex tear of the posterior horn of the medial
meniscus near the meniscal root, with mild medial extrusion.
Intrasubstance degeneration within the body and peripheral posterior
horn. The medial meniscus is similar appearance in comparison prior
exam in [DATE].

Lateral: Intact.

High-grade

LIGAMENTS

Cruciates: The ACL is intact, with mildly increased signal
suggesting a mild degree of mucoid degeneration, unchanged. The PCL
is intact.

Collaterals: Medial collateral ligament is intact. Lateral
collateral ligament complex is intact.

CARTILAGE

Patellofemoral: There is high-grade chondral fissuring along the
median patellar ridge. Mild trochlear chondrosis. Findings are
similar to prior exam.

Medial: High-grade cartilage loss along the weight-bearing surfaces.
Findings are similar to prior exam.

Lateral:  Mild chondrosis.  Findings are similar to prior exam.

JOINT: Moderate-sized joint effusion.

POPLITEAL FOSSA: Small Baker's cyst.

EXTENSOR MECHANISM: Intact quadriceps tendon. Intact patellar
tendon.

BONES: No aggressive osseous lesion. No fracture or dislocation.
Tricompartment osteophyte formation. Intraosseous ganglia at the
central posterior tibial plateau.

Other: No additional findings.
IMPRESSION: Complex tear of the posterior horn medial meniscus near the meniscal
root, with mild meniscal extrusion. Degeneration of the body and
posterior horn. This is similar appearance to prior exam in [DATE].

High-grade cartilage loss of the weight-bearing surfaces of the
medial compartment and high-grade chondral fissuring along the
median patellar ridge, similar appearance to prior exam

Moderate-sized joint effusion.

## 2020-11-22 DIAGNOSIS — S83207D Unspecified tear of unspecified meniscus, current injury, left knee, subsequent encounter: Secondary | ICD-10-CM

## 2020-11-24 ENCOUNTER — Encounter: Payer: Self-pay | Admitting: Physical Therapy

## 2020-11-24 DIAGNOSIS — M1712 Unilateral primary osteoarthritis, left knee: Secondary | ICD-10-CM | POA: Diagnosis not present

## 2020-11-24 DIAGNOSIS — S83242A Other tear of medial meniscus, current injury, left knee, initial encounter: Secondary | ICD-10-CM | POA: Diagnosis not present

## 2020-11-28 ENCOUNTER — Ambulatory Visit: Payer: BC Managed Care – PPO | Admitting: Sports Medicine

## 2020-12-05 ENCOUNTER — Ambulatory Visit: Payer: BC Managed Care – PPO | Admitting: Sports Medicine

## 2021-01-11 DIAGNOSIS — S83242A Other tear of medial meniscus, current injury, left knee, initial encounter: Secondary | ICD-10-CM | POA: Diagnosis not present

## 2021-01-11 DIAGNOSIS — S83272A Complex tear of lateral meniscus, current injury, left knee, initial encounter: Secondary | ICD-10-CM | POA: Diagnosis not present

## 2021-01-11 DIAGNOSIS — M94262 Chondromalacia, left knee: Secondary | ICD-10-CM | POA: Diagnosis not present

## 2021-01-11 DIAGNOSIS — M1712 Unilateral primary osteoarthritis, left knee: Secondary | ICD-10-CM | POA: Diagnosis not present

## 2021-01-11 DIAGNOSIS — S83232A Complex tear of medial meniscus, current injury, left knee, initial encounter: Secondary | ICD-10-CM | POA: Diagnosis not present

## 2021-01-11 DIAGNOSIS — S83282A Other tear of lateral meniscus, current injury, left knee, initial encounter: Secondary | ICD-10-CM | POA: Diagnosis not present

## 2021-01-11 DIAGNOSIS — G8918 Other acute postprocedural pain: Secondary | ICD-10-CM | POA: Diagnosis not present

## 2021-01-19 DIAGNOSIS — S83282D Other tear of lateral meniscus, current injury, left knee, subsequent encounter: Secondary | ICD-10-CM | POA: Diagnosis not present

## 2021-01-19 DIAGNOSIS — S83242D Other tear of medial meniscus, current injury, left knee, subsequent encounter: Secondary | ICD-10-CM | POA: Diagnosis not present

## 2021-01-26 ENCOUNTER — Ambulatory Visit: Payer: BC Managed Care – PPO | Attending: Orthopedic Surgery | Admitting: Physical Therapy

## 2021-01-26 ENCOUNTER — Encounter: Payer: Self-pay | Admitting: Physical Therapy

## 2021-01-26 ENCOUNTER — Other Ambulatory Visit: Payer: Self-pay

## 2021-01-26 DIAGNOSIS — M6281 Muscle weakness (generalized): Secondary | ICD-10-CM | POA: Insufficient documentation

## 2021-01-26 DIAGNOSIS — M25562 Pain in left knee: Secondary | ICD-10-CM | POA: Insufficient documentation

## 2021-01-26 DIAGNOSIS — G8929 Other chronic pain: Secondary | ICD-10-CM | POA: Diagnosis not present

## 2021-01-26 NOTE — Patient Instructions (Signed)
Access Code: [email or web address] URL: [email or web address] Date: 01/26/2021 Prepared by: Hilda Blades  Exercises Supine Quad Set - 5 x daily - 7 x weekly - 10 reps - 5 hold Long Sitting Quad Set with Towel Roll Under Heel - 2 x daily - 7 x weekly - 10 reps - 5 hold Active Straight Leg Raise with Quad Set - 2 x daily - 7 x weekly - 2 sets - 10 reps Supine Heel Slide with Strap - 2 x daily - 7 x weekly - 10 reps - 10 seconds hold Supine Hamstring Stretch with Strap - 2 x daily - 7 x weekly - 3 reps - 30 seconds hold Long Sitting Calf Stretch with Strap - 2 x daily - 7 x weekly - 3 reps - 30 seconds hold Sidelying Hip Abduction - 2 x daily - 7 x weekly - 2 sets - 10 reps

## 2021-01-26 NOTE — Therapy (Signed)
OUTPATIENT PHYSICAL THERAPY LOWER EXTREMITY EVALUATION   Patient Name: Jamie Mccarthy MRN: [id number] DOB:[birth year], 46 y.o., female Today's Date: 01/26/2021   PT End of Session - 01/26/21 1545     Visit Number 1    Number of Visits 16    Date for PT Re-Evaluation 03/23/21    Authorization Type BCBS / [employer]    PT Start Time 1540    PT Stop Time 1625    PT Time Calculation (min) 45 min    Activity Tolerance Patient tolerated treatment well    Behavior During Therapy WFL for tasks assessed/performed             Past Medical History:  Diagnosis Date   Anemia    Has sickle cell trait   Anxiety    Depression    GERD (gastroesophageal reflux disease)    diet controlled   Gestational diabetes    H/O vaginal delivery 2010   Headache(784.0)    Migraines   Herpes 2007   hx of   Iron deficiency anemia, unspecified 10/20/2012   Neuromuscular disorder ([place])    Obese    PONV (postoperative nausea and vomiting)    has had PONV with previous c/s and also lap band surgery 2004   Raynaud's disease    Seasonal allergies    Past Surgical History:  Procedure Laterality Date   BILATERAL SALPINGECTOMY  07/16/2012   Procedure: BILATERAL DISTAL SALPINGECTOMY;  Surgeon: Logan Bores, MD;  Location: [facility];  Service: Obstetrics;;   CESAREAN SECTION  2012   CESAREAN SECTION N/A 07/16/2012   Procedure: REPEAT CESAREAN SECTION;  Surgeon: Logan Bores, MD;  Location: [facility];  Service: Obstetrics;  Laterality: N/A;   CHOLECYSTECTOMY N/A 11/14/2012   Procedure: LAPAROSCOPIC CHOLECYSTECTOMY;  Surgeon: Gayland Curry, MD;  Location: [facility];  Service: General;  Laterality: N/A;   GASTRIC BANDING PORT REVISION N/A 11/14/2012   Procedure: GASTRIC BANDING PORT REVISION;  Surgeon: Gayland Curry, MD;  Location: [facility];  Service: General;  Laterality: N/A;  PORT PLACEMENT MOVED NEW MESH INSERTED    GASTRIC ROUX-EN-Y N/A 12/21/2019   Procedure: LAPAROSCOPIC ROUX-EN-Y GASTRIC  BYPASS;  Surgeon: [name], MD;  Location: [facility];  Service: General;  Laterality: N/A;   LAPAROSCOPIC GASTRIC BANDING  2005   LAPAROSCOPIC VAGINAL HYSTERECTOMY WITH SALPINGECTOMY Bilateral 10/12/2015   Procedure: HYSTERECTOMY TOTAL LAPAROSCOPIC BILATERAL SALPINGECTOMY;  Surgeon: Sherlyn Hay, DO;  Location: [facility];  Service: Gynecology;  Laterality: Bilateral;   reposition gastric band  2006   x2   TUBAL LIGATION  2014   UPPER GI ENDOSCOPY N/A 12/21/2019   Procedure: UPPER GI ENDOSCOPY;  Surgeon: [name], MD;  Location: [facility];  Service: General;  Laterality: N/A;   [place]   Patient Active Problem List   Diagnosis Date Noted   Low libido 06/17/2020   Sleep disturbance 06/17/2020   Controlled type 2 diabetes mellitus with complication, without long-term current use of insulin ([place]) 03/08/2020   S/P total gastrectomy and Roux-en-Y esophagojejunal anastomosis 12/29/2019   Severe obesity (BMI >= 40) ([place]) 12/21/2019   S/P gastric bypass 12/21/2019   Paresthesias 10/08/2019   Migraine headache without aura 09/22/2019   History of diabetes mellitus, type II 07/14/2019   Acute meniscal tear of left knee 01/19/2019   History of gestational diabetes 01/01/2019   Abnormal uterine bleeding 01/01/2019   Genital herpes simplex 11/14/2018   Seborrheic dermatitis of scalp 08/07/2018  Anxiety and depression 05/27/2018   Orthostasis 12/06/2017   Trochanteric bursitis, right hip 03/06/2016   Menorrhagia 10/12/2015   Fibroid 10/12/2015   S/P laparoscopic hysterectomy 10/12/2015   Lumbosacral strain 08/18/2014   Iron deficiency anemia, unspecified 10/20/2012   H/O laparoscopic adjustable gastric banding 02/2003 10/02/2012   Regurgitation 10/02/2012   S/P repeat low transverse C-section 07/17/2012   Sickle cell trait ([place]) 12/10/2011   Allergic rhinitis 06/14/2010   GERD 12/27/2008   Catamenial migraine 10/30/2005   RHINITIS, ALLERGIC 10/30/2005    PCP:  Hali Marry, MD  REFERRING PROVIDER: Earlie Server, MD  REFERRING DIAG: Post-op Left Knee Scope Medial Menisectomy Possible Micro Fracture  THERAPY DIAG:  Chronic pain of left knee  Muscle weakness (generalized)  ONSET DATE: 01/12/2021   SUBJECTIVE: SUBJECTIVE STATEMENT: Patient had left knee scope meniscectomy on 01/11/2021. Currently she is ambulating with single crutch and performing ankle pumps and quad sets at home. She notes mainly stiffness of the knee if she has been sitting too long and with bending. She will occasionally have pains if she is on the knee too much. She continues to use ice and elevation.   Next follow-up with surgeon on 02/13/2021  PERTINENT HISTORY: N/A  PAIN:  Are you having pain? No VAS scale: 4/10 Pain location: Knee Pain orientation: Left  PAIN TYPE: Surgical Pain description:  Stiffness   Aggravating factors: Bending the knee Relieving factors: Rest  PRECAUTIONS: None  WEIGHT BEARING RESTRICTIONS: WBAT  FALLS:  Has patient fallen in last 6 months? 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)   OBJECTIVE:  DIAGNOSTIC FINDINGS: NA  PATIENT SURVEYS:  FOTO 47% functional status  COGNITION: Overall cognitive status: Within functional limits for tasks assessed     SENSATION:  Light touch: Appears intact  MUSCLE LENGTH: Quad limitation  PALPATION: Generalized knee tenderness consistent with post-op, portal incisional tenderness  LE AROM/PROM:  A/PROM Right 01/26/2021 Left 01/26/2021  Knee flexion 135 83 (94 PROM)  Knee extension 5 hyper 5 hyper   LE MMT:  MMT Right 01/26/2021 Left 01/26/2021  Hip flexion 4 4-  Hip  extension 4 4-  Hip abduction 4 4-  Knee flexion 5 4-  Knee extension 5 Mod. quad activation   Slight patellar superior/inferior hypomobility  GAIT: Assistive device utilized: Crutches (single crutch) Level of assistance: Modified independence Comments: patient instructed on proper technique with single axillary crutch on right side   TODAY'S TREATMENT: Quad set 5 x 5 sec hold with towel under knee Quad set 5 x 5 sec hold with towel under heel SLR x 5  Heel slide with strap 5 x 10 sec hold Hamstring stretch with strap x 30 sec Calf stretch with strap x 30 sec Sidelying hip abduction x 10  PATIENT EDUCATION:  Education details: Exam findings, POC, HEP Person educated: Patient Education method: Explanation, Demonstration, Tactile cues, Verbal cues, and Handouts Education comprehension: verbalized understanding, returned demonstration, verbal cues required, tactile cues required, and needs further education  HOME EXERCISE PROGRAM: Access Code: [email or web address]   ASSESSMENT: CLINICAL IMPRESSION: Patient is a 46 y.o. female who was seen today for physical therapy evaluation and treatment for s/p left knee scope meniscectomy on 01/11/2021. Objective impairments include  Abnormal gait, decreased activity tolerance, decreased balance, difficulty walking, decreased ROM, decreased strength, impaired flexibility, and pain. These impairments are limiting patient from community activity, driving, meal prep, occupation, laundry, and yard work. Personal factors including Fitness, Past/current experiences, and Time since onset of injury/illness/exacerbation are also affecting patient's functional outcome. Patient will benefit from skilled PT to address above impairments and improve overall function.  REHAB POTENTIAL: Good  CLINICAL DECISION MAKING: Stable/uncomplicated  EVALUATION COMPLEXITY: Low   GOALS: Goals reviewed with patient? Yes  SHORT TERM GOALS:  STG Name Target Date Goal status  1  Patient will be I with initial HEP in order to progress with therapy. Baseline: provided at eval 02/23/2021 INITIAL  2 PT will review FOTO with patient by 3rd visit in order to understand expected progress and outcome with therapy. Baseline: assessed at eval 02/23/2021 INITIAL  3 Patient will achieve 110 deg active left knee flexion to improve gait and transfers Baseline: 83 deg 02/23/2021 INITIAL  4 Patient will be able to ambulate community level distances without AD to improve mobility Baseline: patient using single axillary crutch 02/23/2021 INITIAL   LONG TERM GOALS:   LTG Name Target Date Goal status  1 Patient will be I with final HEP to maintain progress from PT. Baseline: provided at eval 03/23/2021 INITIAL  2 Patient will report >/= 65% status on FOTO to indicate improved functional ability. Baseline: 47% 03/23/2021 INITIAL  3 Patient will achieve >/= 130 deg active left knee flexion to return to exercise with limitation Baseline: 03/23/2021 INITIAL  4 Patient will demonstrate 5/5 left knee strength to normalize stair negotiation Baseline: moderate quad activation 03/23/2021 INITIAL  5 Patient will report </= 1/10 pain with exercise to return to active lifestyle Baseline: 4/10 03/23/2021 INITIAL   PLAN: PT FREQUENCY: 2x/week  PT DURATION: 8 weeks  PLANNED INTERVENTIONS: Therapeutic exercises, Therapeutic activity, Neuro Muscular re-education, Balance training, Gait training, Patient/Family education, Joint mobilization, Stair training, Aquatic Therapy, Dry Needling, Electrical stimulation, Cryotherapy, Moist heat, Taping, Vasopneumatic device, and Manual therapy  PLAN FOR NEXT SESSION: Review HEP and progress PRN, manual/stretching for knee flexion, quad activation and strengthening, initiate partial squats/step-ups   Hilda Blades, PT, DPT, LAT, ATC 01/26/21  4:59 PM Phone: [phone] Fax: [phone]

## 2021-01-28 NOTE — Therapy (Signed)
OUTPATIENT PHYSICAL THERAPY TREATMENT NOTE   Patient Name: Jamie Mccarthy MRN: [id number] DOB:1975-07-22, 46 y.o., female 46 Date: 01/31/2021  PCP: Hali Marry, MD REFERRING PROVIDER: Earlie Server, MD   PT End of Session - 01/31/21 1301     Visit Number 2    Number of Visits 16    Date for PT Re-Evaluation 03/23/21    Authorization Type BCBS / [employer]    PT Start Time 1305    PT Stop Time 1400   10 minutes vasopneumatic   PT Time Calculation (min) 55 min    Activity Tolerance Patient tolerated treatment well    Behavior During Therapy WFL for tasks assessed/performed             Past Medical History:  Diagnosis Date   Anemia    Has sickle cell trait   Anxiety    Depression    GERD (gastroesophageal reflux disease)    diet controlled   Gestational diabetes    H/O vaginal delivery 2010   Headache(784.0)    Migraines   Herpes 2007   hx of   Iron deficiency anemia, unspecified 10/20/2012   Neuromuscular disorder ([place])    Obese    PONV (postoperative nausea and vomiting)    has had PONV with previous c/s and also lap band surgery 2004   Raynaud's disease    Seasonal allergies    Past Surgical History:  Procedure Laterality Date   BILATERAL SALPINGECTOMY  07/16/2012   Procedure: BILATERAL DISTAL SALPINGECTOMY;  Surgeon: Logan Bores, MD;  Location: [facility];  Service: Obstetrics;;   CESAREAN SECTION  2012   CESAREAN SECTION N/A 07/16/2012   Procedure: REPEAT CESAREAN SECTION;  Surgeon: Logan Bores, MD;  Location: [facility];  Service: Obstetrics;  Laterality: N/A;   CHOLECYSTECTOMY N/A 11/14/2012   Procedure: LAPAROSCOPIC CHOLECYSTECTOMY;  Surgeon: Gayland Curry, MD;  Location: [facility];  Service: General;  Laterality: N/A;   GASTRIC BANDING PORT REVISION N/A 11/14/2012   Procedure: GASTRIC BANDING PORT REVISION;  Surgeon: Gayland Curry, MD;  Location: [facility];  Service: General;  Laterality: N/A;  PORT PLACEMENT MOVED NEW MESH  INSERTED    GASTRIC ROUX-EN-Y N/A 12/21/2019   Procedure: LAPAROSCOPIC ROUX-EN-Y GASTRIC BYPASS;  Surgeon: [name], MD;  Location: [facility];  Service: General;  Laterality: N/A;   LAPAROSCOPIC GASTRIC BANDING  2005   LAPAROSCOPIC VAGINAL HYSTERECTOMY WITH SALPINGECTOMY Bilateral 10/12/2015   Procedure: HYSTERECTOMY TOTAL LAPAROSCOPIC BILATERAL SALPINGECTOMY;  Surgeon: Sherlyn Hay, DO;  Location: [facility];  Service: Gynecology;  Laterality: Bilateral;   reposition gastric band  2006   x2   TUBAL LIGATION  2014   UPPER GI ENDOSCOPY N/A 12/21/2019   Procedure: UPPER GI ENDOSCOPY;  Surgeon: [name], MD;  Location: [facility];  Service: General;  Laterality: N/A;   [place]   Patient Active Problem List   Diagnosis Date Noted   Low libido 06/17/2020   Sleep disturbance 06/17/2020   Controlled type 2 diabetes mellitus with complication, without long-term current use of insulin ([place]) 03/08/2020   S/P total gastrectomy and Roux-en-Y esophagojejunal anastomosis 12/29/2019   Severe obesity (BMI >= 40) ([place]) 12/21/2019   S/P gastric bypass 12/21/2019   Paresthesias 10/08/2019   Migraine headache without aura 09/22/2019   History of diabetes mellitus, type II 07/14/2019   Acute meniscal tear of left knee 01/19/2019   History of gestational diabetes 01/01/2019   Abnormal uterine bleeding 01/01/2019  Genital herpes simplex 11/14/2018   Seborrheic dermatitis of scalp 08/07/2018   Anxiety and depression 05/27/2018   Orthostasis 12/06/2017   Trochanteric bursitis, right hip 03/06/2016   Menorrhagia 10/12/2015   Fibroid 10/12/2015   S/P laparoscopic hysterectomy 10/12/2015   Lumbosacral strain 08/18/2014   Iron deficiency anemia, unspecified 10/20/2012   H/O laparoscopic adjustable gastric banding 02/2003 10/02/2012   Regurgitation 10/02/2012   S/P repeat low transverse C-section 07/17/2012   Sickle cell trait ([place]) 12/10/2011   Allergic rhinitis 06/14/2010    GERD 12/27/2008   Catamenial migraine 10/30/2005   RHINITIS, ALLERGIC 10/30/2005    REFERRING DIAG: Post-op Left Knee Scope Medial Menisectomy Possible Micro Fracture  THERAPY DIAG:  Chronic pain of left knee  Muscle weakness (generalized)  PERTINENT HISTORY: N/A  PRECAUTIONS: None  SUBJECTIVE: Pt reports that things have been going "okay." She reports doing her HEP regularly with some stiffness/soreness with her exercises.  PAIN:  Are you having pain? Yes VAS scale: 3/10 Pain location: Knee Pain orientation: Left  PAIN TYPE: aching Pain description: intermittent     OBJECTIVE:  *Unless otherwise noted, objective information gathered previously*  DIAGNOSTIC FINDINGS: NA   PATIENT SURVEYS:  FOTO 47% functional status   COGNITION: Overall cognitive status: Within functional limits for tasks assessed                        SENSATION:          Light touch: Appears intact   MUSCLE LENGTH: Quad limitation   PALPATION: Generalized knee tenderness consistent with post-op, portal incisional tenderness   LE AROM/PROM:   A/PROM Right 01/26/2021 Left 01/26/2021 Left 01/31/2021  Knee flexion 135 83 (94 PROM) 120 (124 PROM, 133 following MET)  Knee extension 5 hyper 5 hyper     LE MMT:   MMT Right 01/26/2021 Left 01/26/2021  Hip flexion 4 4-  Hip extension 4 4-  Hip abduction 4 4-  Knee flexion 5 4-  Knee extension 5 Mod. quad activation    Slight patellar superior/inferior hypomobility   GAIT: Assistive device utilized: Crutches (single crutch) Level of assistance: Modified independence Comments: patient instructed on proper technique with single axillary crutch on right side     [facility] Adult PT Treatment:                                                DATE: 01/31/2021 Therapeutic Exercise: Seated LAQ 2x10 with 3-sec holds Supine knee flexion contract/ relax MET with strap for ROM x5 with 5-sec holds Supine SLR with abduction 2x10 Sidelying hip abduction 2x10  BIL Bridge with pillow squeeze 2x10 with 3-sec hold Mini-squat with UE support 2x10 Seated active hamstring stretch 2x1 minute on Lt Manual Therapy: N/A Neuromuscular re-ed: N/A Therapeutic Activity: N/A Modalities: Vasopneumatic compression (Game Ready) on Lt knee at 34 degrees x10 minutes Self Care: N/A   EVAL TREATMENT: Quad set 5 x 5 sec hold with towel under knee Quad set 5 x 5 sec hold with towel under heel SLR x 5  Heel slide with strap 5 x 10 sec hold Hamstring stretch with strap x 30 sec Calf stretch with strap x 30 sec Sidelying hip abduction x 10   PATIENT EDUCATION:  Education details: Exam findings, POC, HEP Person educated: Patient Education method: Explanation, Demonstration, Tactile cues, Verbal cues, and Handouts  Education comprehension: verbalized understanding, returned demonstration, verbal cues required, tactile cues required, and needs further education   HOME EXERCISE PROGRAM: Access Code: [email or web address]     ASSESSMENT: CLINICAL IMPRESSION: Pt responded well to all interventions today, demonstrating good form and no increase in pain with selected exercises. Of note, she demonstrates greatly improved knee flexion AROM which was further improved with knee flexion contract/relax MET. Pt reports that selected exercises present a good challenge without increasing her pain. She will continue to benefit from skilled PT to address her primary impairments and return to her prior level of function with less limitation.   REHAB POTENTIAL: Good   CLINICAL DECISION MAKING: Stable/uncomplicated   EVALUATION COMPLEXITY: Low     GOALS: Goals reviewed with patient? Yes   SHORT TERM GOALS:   STG Name Target Date Goal status  1 Patient will be I with initial HEP in order to progress with therapy. Baseline: provided at eval 02/23/2021 INITIAL  2 PT will review FOTO with patient by 3rd visit in order to understand expected progress and outcome with therapy. Baseline:  assessed at eval 02/23/2021 INITIAL  3 Patient will achieve 110 deg active left knee flexion to improve gait and transfers Baseline: 83 deg 02/23/2021 INITIAL  4 Patient will be able to ambulate community level distances without AD to improve mobility Baseline: patient using single axillary crutch 02/23/2021 INITIAL    LONG TERM GOALS:    LTG Name Target Date Goal status  1 Patient will be I with final HEP to maintain progress from PT. Baseline: provided at eval 03/23/2021 INITIAL  2 Patient will report >/= 65% status on FOTO to indicate improved functional ability. Baseline: 47% 03/23/2021 INITIAL  3 Patient will achieve >/= 130 deg active left knee flexion to return to exercise with limitation Baseline: 03/23/2021 INITIAL  4 Patient will demonstrate 5/5 left knee strength to normalize stair negotiation Baseline: moderate quad activation 03/23/2021 INITIAL  5 Patient will report </= 1/10 pain with exercise to return to active lifestyle Baseline: 4/10 03/23/2021 INITIAL    PLAN: PT FREQUENCY: 2x/week   PT DURATION: 8 weeks   PLANNED INTERVENTIONS: Therapeutic exercises, Therapeutic activity, Neuro Muscular re-education, Balance training, Gait training, Patient/Family education, Joint mobilization, Stair training, Aquatic Therapy, Dry Needling, Electrical stimulation, Cryotherapy, Moist heat, Taping, Vasopneumatic device, and Manual therapy   PLAN FOR NEXT SESSION: Review HEP and progress PRN, manual/stretching for knee flexion, quad activation and strengthening, initiate partial squats/step-ups     Vanessa Fairwood, PT, DPT 01/31/21 2:00 PM

## 2021-01-30 ENCOUNTER — Encounter: Payer: Self-pay | Admitting: Family Medicine

## 2021-01-30 ENCOUNTER — Other Ambulatory Visit: Payer: Self-pay | Admitting: Family Medicine

## 2021-01-30 ENCOUNTER — Ambulatory Visit (INDEPENDENT_AMBULATORY_CARE_PROVIDER_SITE_OTHER): Payer: BC Managed Care – PPO | Admitting: Family Medicine

## 2021-01-30 ENCOUNTER — Other Ambulatory Visit: Payer: Self-pay

## 2021-01-30 VITALS — BP 137/87 | HR 77 | Temp 97.8°F | Resp 18 | Ht 64.0 in | Wt 217.8 lb

## 2021-01-30 DIAGNOSIS — R5383 Other fatigue: Secondary | ICD-10-CM | POA: Diagnosis not present

## 2021-01-30 DIAGNOSIS — R42 Dizziness and giddiness: Secondary | ICD-10-CM | POA: Diagnosis not present

## 2021-01-30 DIAGNOSIS — G43009 Migraine without aura, not intractable, without status migrainosus: Secondary | ICD-10-CM | POA: Diagnosis not present

## 2021-01-30 DIAGNOSIS — E118 Type 2 diabetes mellitus with unspecified complications: Secondary | ICD-10-CM | POA: Diagnosis not present

## 2021-01-30 DIAGNOSIS — Z23 Encounter for immunization: Secondary | ICD-10-CM

## 2021-01-30 DIAGNOSIS — B3731 Acute candidiasis of vulva and vagina: Secondary | ICD-10-CM

## 2021-01-30 LAB — POCT GLYCOSYLATED HEMOGLOBIN (HGB A1C): HbA1c, POC (controlled diabetic range): 6.1 % (ref 0.0–7.0)

## 2021-01-30 LAB — POCT UA - MICROALBUMIN
Albumin/Creatinine Ratio, Urine, POC: <30
Creatinine, POC: 200 mg/dL
Microalbumin Ur, POC: 10 mg/L

## 2021-01-30 MED ORDER — AIMOVIG 70 MG/ML ~~LOC~~ SOAJ: SUBCUTANEOUS | 6 refills | Status: DC

## 2021-01-30 MED ORDER — VITAMIN D (ERGOCALCIFEROL) 1.25 MG (50000 UNIT) PO CAPS: ORAL_CAPSULE | ORAL | 6 refills | Status: DC
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
## 2021-01-30 NOTE — Assessment & Plan Note (Signed)
Doing well on Aimovig we will go ahead and refill medication today.

## 2021-01-30 NOTE — Assessment & Plan Note (Addendum)
A1c up just slightly from previous but still overall looks good just continue to work on healthy food choices she reports that she has been getting into a little bit more rice and carbs lately.  Urine microalbumin completed today as well.  Pneumonia vaccine given today.

## 2021-01-31 ENCOUNTER — Ambulatory Visit: Payer: BC Managed Care – PPO

## 2021-01-31 DIAGNOSIS — M25562 Pain in left knee: Secondary | ICD-10-CM | POA: Diagnosis not present

## 2021-01-31 DIAGNOSIS — M6281 Muscle weakness (generalized): Secondary | ICD-10-CM

## 2021-01-31 DIAGNOSIS — G8929 Other chronic pain: Secondary | ICD-10-CM

## 2021-02-01 NOTE — Therapy (Signed)
OUTPATIENT PHYSICAL THERAPY TREATMENT NOTE   Patient Name: Jamie Mccarthy MRN: [id number] DOB:1975/04/20, 46 y.o., female 20 Date: 02/02/2021  PCP: Hali Marry, MD REFERRING PROVIDER: Earlie Server, MD   PT End of Session - 02/02/21 1141     Visit Number 3    Number of Visits 16    Date for PT Re-Evaluation 03/23/21    Authorization Type [employer] / [employer]    PT Start Time 1137    PT Stop Time 1215    PT Time Calculation (min) 38 min    Activity Tolerance Patient tolerated treatment well    Behavior During Therapy WFL for tasks assessed/performed              Past Medical History:  Diagnosis Date   Anemia    Has sickle cell trait   Anxiety    Depression    GERD (gastroesophageal reflux disease)    diet controlled   Gestational diabetes    H/O vaginal delivery 2010   Headache(784.0)    Migraines   Herpes 2007   hx of   Iron deficiency anemia, unspecified 10/20/2012   Neuromuscular disorder ([place])    Obese    PONV (postoperative nausea and vomiting)    has had PONV with previous c/s and also lap band surgery 2004   Raynaud's disease    Seasonal allergies    Past Surgical History:  Procedure Laterality Date   BILATERAL SALPINGECTOMY  07/16/2012   Procedure: BILATERAL DISTAL SALPINGECTOMY;  Surgeon: Logan Bores, MD;  Location: [facility];  Service: Obstetrics;;   CESAREAN SECTION  2012   CESAREAN SECTION N/A 07/16/2012   Procedure: REPEAT CESAREAN SECTION;  Surgeon: Logan Bores, MD;  Location: [facility];  Service: Obstetrics;  Laterality: N/A;   CHOLECYSTECTOMY N/A 11/14/2012   Procedure: LAPAROSCOPIC CHOLECYSTECTOMY;  Surgeon: Gayland Curry, MD;  Location: [facility];  Service: General;  Laterality: N/A;   GASTRIC BANDING PORT REVISION N/A 11/14/2012   Procedure: GASTRIC BANDING PORT REVISION;  Surgeon: Gayland Curry, MD;  Location: [facility];  Service: General;  Laterality: N/A;  PORT PLACEMENT MOVED NEW MESH INSERTED    GASTRIC ROUX-EN-Y  N/A 12/21/2019   Procedure: LAPAROSCOPIC ROUX-EN-Y GASTRIC BYPASS;  Surgeon: [name], MD;  Location: [facility];  Service: General;  Laterality: N/A;   LAPAROSCOPIC GASTRIC BANDING  2005   LAPAROSCOPIC VAGINAL HYSTERECTOMY WITH SALPINGECTOMY Bilateral 10/12/2015   Procedure: HYSTERECTOMY TOTAL LAPAROSCOPIC BILATERAL SALPINGECTOMY;  Surgeon: Sherlyn Hay, DO;  Location: [facility];  Service: Gynecology;  Laterality: Bilateral;   reposition gastric band  2006   x2   TUBAL LIGATION  2014   UPPER GI ENDOSCOPY N/A 12/21/2019   Procedure: UPPER GI ENDOSCOPY;  Surgeon: [name], MD;  Location: [facility];  Service: General;  Laterality: N/A;   [place]   Patient Active Problem List   Diagnosis Date Noted   Low libido 06/17/2020   Sleep disturbance 06/17/2020   Controlled type 2 diabetes mellitus with complication, without long-term current use of insulin ([place]) 03/08/2020   S/P total gastrectomy and Roux-en-Y esophagojejunal anastomosis 12/29/2019   Severe obesity (BMI >= 40) ([place]) 12/21/2019   S/P gastric bypass 12/21/2019   Paresthesias 10/08/2019   Migraine headache without aura 09/22/2019   History of diabetes mellitus, type II 07/14/2019   Acute meniscal tear of left knee 01/19/2019   History of gestational diabetes 01/01/2019   Abnormal uterine bleeding 01/01/2019   Genital herpes  simplex 11/14/2018   Seborrheic dermatitis of scalp 08/07/2018   Anxiety and depression 05/27/2018   Orthostasis 12/06/2017   Trochanteric bursitis, right hip 03/06/2016   Menorrhagia 10/12/2015   Fibroid 10/12/2015   S/P laparoscopic hysterectomy 10/12/2015   Lumbosacral strain 08/18/2014   Iron deficiency anemia, unspecified 10/20/2012   H/O laparoscopic adjustable gastric banding 02/2003 10/02/2012   Regurgitation 10/02/2012   S/P repeat low transverse C-section 07/17/2012   Sickle cell trait ([place]) 12/10/2011   Allergic rhinitis 06/14/2010   GERD 12/27/2008   Catamenial  migraine 10/30/2005   RHINITIS, ALLERGIC 10/30/2005    REFERRING PROVIDER: Earlie Server, MD REFERRING DIAG: Post-op Left Knee Scope Medial Menisectomy Possible Micro Fracture ONSET DATE: 01/12/2021  THERAPY DIAG:  Chronic pain of left knee  Muscle weakness (generalized)  PERTINENT HISTORY: N/A  PRECAUTIONS: None WEIGHT BEARING RESTRICTIONS: WBAT  SUBJECTIVE: Pt reports she continues to have difficulty with the quad activation. She feels her knee bending is doing better.  PAIN:  Are you having pain? Yes VAS scale: 2/10 Pain location: Knee Pain orientation: Left  PAIN TYPE: Stiffness, sore Pain description: intermittent  PATIENT GOALS: Get back to working out (pilates and pure barr)   OBJECTIVE: ([place] THIS VISIT) PATIENT SURVEYS:  FOTO 47% functional status   LE AROM/PROM:   A/PROM Right 01/26/2021 Left 01/26/2021 Left 01/31/2021  02/02/2021  Knee flexion 135 83 (94 PROM) 120 (124 PROM, 133 following MET) 125  Knee extension 5 hyper 5 hyper      LE MMT:   MMT Right 01/26/2021 Left 01/26/2021  Hip flexion 4 4-  Hip extension 4 4-  Hip abduction 4 4-  Knee flexion 5 4-  Knee extension 5 Mod. quad activation    Slight patellar superior/inferior hypomobility   GAIT: Assistive device utilized: Crutches (single crutch) Level of assistance: Modified independence Comments: patient instructed on proper technique with single axillary crutch on right side    TODAY'S TREATMENT:  1/122023 Recumbent bike x 5 min - focus on knee flexion motion Supine quad set with towel roll under knee 2 x 10 with 5 sec hold Prone TKE 2 x 10 SAQ with leg lift 2 x 5 Quad set with hyperextension using towel 2 x 10 - focus on controlling end range knee hyperextension SLR 2 x 5   01/31/2021 Therapeutic Exercise: Seated LAQ 2x10 with 3-sec holds Supine knee flexion contract/ relax MET with strap for ROM x5 with 5-sec holds Supine SLR with abduction 2x10 Sidelying  hip abduction 2x10 BIL Bridge with pillow squeeze 2x10 with 3-sec hold Mini-squat with UE support 2x10 Seated active hamstring stretch 2x1 minute on Lt Modalities: Vasopneumatic compression (Game Ready) on Lt knee at 34 degrees x10 minutes   01/26/2021 Quad set 5 x 5 sec hold with towel under knee Quad set 5 x 5 sec hold with towel under heel SLR x 5  Heel slide with strap 5 x 10 sec hold Hamstring stretch with strap x 30 sec Calf stretch with strap x 30 sec Sidelying hip abduction x 10   PATIENT EDUCATION:  Education details: HEP update Person educated: Patient Education method: Explanation, Demonstration, Tactile cues, Verbal cues, and Handouts Education comprehension: verbalized understanding, returned demonstration, verbal cues required, tactile cues required, and needs further education   HOME EXERCISE PROGRAM: Access Code: [email or web address]     ASSESSMENT: CLINICAL IMPRESSION: Patient tolerated therapy well with no adverse effects. Therapy focused on improving quad activation and active hyperextension. She demonstrates much improved knee flexion range  of motion but continues to demonstrate difficulty with quad activation and has a slight knee extension lag with SLR. She is unable to perform active hyperextension at this time. Patient would benefit from continued skilled PT to progress her mobility and strength in order to reduce pain and maximize functional ability.     GOALS: Goals reviewed with patient? Yes   SHORT TERM GOALS:   STG Name Target Date Goal status  1 Patient will be I with initial HEP in order to progress with therapy. Baseline: provided at eval 02/23/2021 INITIAL  2 PT will review FOTO with patient by 3rd visit in order to understand expected progress and outcome with therapy. Baseline: assessed at eval 02/23/2021 INITIAL  3 Patient will achieve 110 deg active left knee flexion to improve gait and transfers Baseline: 83 deg 02/23/2021 INITIAL  4 Patient will be able  to ambulate community level distances without AD to improve mobility Baseline: patient using single axillary crutch 02/23/2021 INITIAL    LONG TERM GOALS:    LTG Name Target Date Goal status  1 Patient will be I with final HEP to maintain progress from PT. Baseline: provided at eval 03/23/2021 INITIAL  2 Patient will report >/= 65% status on FOTO to indicate improved functional ability. Baseline: 47% 03/23/2021 INITIAL  3 Patient will achieve >/= 130 deg active left knee flexion to return to exercise with limitation Baseline: 03/23/2021 INITIAL  4 Patient will demonstrate 5/5 left knee strength to normalize stair negotiation Baseline: moderate quad activation 03/23/2021 INITIAL  5 Patient will report </= 1/10 pain with exercise to return to active lifestyle Baseline: 4/10 03/23/2021 INITIAL     PLAN: PT FREQUENCY: 2x/week   PT DURATION: 8 weeks   PLANNED INTERVENTIONS: Therapeutic exercises, Therapeutic activity, Neuro Muscular re-education, Balance training, Gait training, Patient/Family education, Joint mobilization, Stair training, Aquatic Therapy, Dry Needling, Electrical stimulation, Cryotherapy, Moist heat, Taping, Vasopneumatic device, and Manual therapy   PLAN FOR NEXT SESSION: Review HEP and progress PRN, manual/stretching for knee flexion, quad activation and strengthening, initiate partial squats/step-ups    Hilda Blades, PT, DPT, LAT, ATC 02/02/21  12:30 PM Phone: [phone] Fax: [phone]

## 2021-02-02 ENCOUNTER — Other Ambulatory Visit: Payer: Self-pay

## 2021-02-02 ENCOUNTER — Ambulatory Visit: Payer: BC Managed Care – PPO | Admitting: Physical Therapy

## 2021-02-02 ENCOUNTER — Other Ambulatory Visit: Payer: Self-pay | Admitting: Family Medicine

## 2021-02-02 ENCOUNTER — Encounter: Payer: Self-pay | Admitting: Physical Therapy

## 2021-02-02 DIAGNOSIS — M25562 Pain in left knee: Secondary | ICD-10-CM | POA: Diagnosis not present

## 2021-02-02 DIAGNOSIS — M6281 Muscle weakness (generalized): Secondary | ICD-10-CM | POA: Diagnosis not present

## 2021-02-02 DIAGNOSIS — G8929 Other chronic pain: Secondary | ICD-10-CM | POA: Diagnosis not present

## 2021-02-02 DIAGNOSIS — L219 Seborrheic dermatitis, unspecified: Secondary | ICD-10-CM

## 2021-02-02 NOTE — Patient Instructions (Signed)
Access Code: [email or web address] URL: [email or web address] Date: 02/02/2021 Prepared by: Hilda Blades  Exercises Supine Quad Set - 5 x daily - 7 x weekly - 10 reps - 5 hold Long Sitting Quad Set with Towel Roll Under Heel - 2 x daily - 7 x weekly - 10 reps - 5 hold Active Straight Leg Raise with Quad Set - 2 x daily - 7 x weekly - 2 sets - 10 reps Supine Heel Slide with Strap - 2 x daily - 7 x weekly - 10 reps - 10 seconds hold Supine Hamstring Stretch with Strap - 2 x daily - 7 x weekly - 3 reps - 30 seconds hold Long Sitting Calf Stretch with Strap - 2 x daily - 7 x weekly - 3 reps - 30 seconds hold Sidelying Hip Abduction - 2 x daily - 7 x weekly - 2 sets - 10 reps Prone Terminal Knee Extension - 2 x daily - 7 x weekly - 10 reps - 5 hold Long Sitting Knee Extension with Towel Foot Lift - 2 x daily - 7 x weekly - 10 reps - 5 hold

## 2021-02-06 NOTE — Therapy (Incomplete)
OUTPATIENT PHYSICAL THERAPY TREATMENT NOTE   Patient Name: Jamie Mccarthy MRN: [id number] DOB:[birth year], 46 y.o., female 24 Date: 02/06/2021  PCP: Hali Marry, MD REFERRING PROVIDER: Earlie Server, MD      Past Medical History:  Diagnosis Date   Anemia    Has sickle cell trait   Anxiety    Depression    GERD (gastroesophageal reflux disease)    diet controlled   Gestational diabetes    H/O vaginal delivery 2010   Headache(784.0)    Migraines   Herpes 2007   hx of   Iron deficiency anemia, unspecified 10/20/2012   Neuromuscular disorder ([place])    Obese    PONV (postoperative nausea and vomiting)    has had PONV with previous c/s and also lap band surgery 2004   Raynaud's disease    Seasonal allergies    Past Surgical History:  Procedure Laterality Date   BILATERAL SALPINGECTOMY  07/16/2012   Procedure: BILATERAL DISTAL SALPINGECTOMY;  Surgeon: Logan Bores, MD;  Location: [facility];  Service: Obstetrics;;   CESAREAN SECTION  2012   CESAREAN SECTION N/A 07/16/2012   Procedure: REPEAT CESAREAN SECTION;  Surgeon: Logan Bores, MD;  Location: [facility];  Service: Obstetrics;  Laterality: N/A;   CHOLECYSTECTOMY N/A 11/14/2012   Procedure: LAPAROSCOPIC CHOLECYSTECTOMY;  Surgeon: Gayland Curry, MD;  Location: [facility];  Service: General;  Laterality: N/A;   GASTRIC BANDING PORT REVISION N/A 11/14/2012   Procedure: GASTRIC BANDING PORT REVISION;  Surgeon: Gayland Curry, MD;  Location: [facility];  Service: General;  Laterality: N/A;  PORT PLACEMENT MOVED NEW MESH INSERTED    GASTRIC ROUX-EN-Y N/A 12/21/2019   Procedure: LAPAROSCOPIC ROUX-EN-Y GASTRIC BYPASS;  Surgeon: [name], MD;  Location: [facility];  Service: General;  Laterality: N/A;   LAPAROSCOPIC GASTRIC BANDING  2005   LAPAROSCOPIC VAGINAL HYSTERECTOMY WITH SALPINGECTOMY Bilateral 10/12/2015   Procedure: HYSTERECTOMY TOTAL LAPAROSCOPIC BILATERAL SALPINGECTOMY;  Surgeon: Sherlyn Hay, DO;   Location: [facility];  Service: Gynecology;  Laterality: Bilateral;   reposition gastric band  2006   x2   TUBAL LIGATION  2014   UPPER GI ENDOSCOPY N/A 12/21/2019   Procedure: UPPER GI ENDOSCOPY;  Surgeon: [name], MD;  Location: [facility];  Service: General;  Laterality: N/A;   [place]   Patient Active Problem List   Diagnosis Date Noted   Low libido 06/17/2020   Sleep disturbance 06/17/2020   Controlled type 2 diabetes mellitus with complication, without long-term current use of insulin ([place]) 03/08/2020   S/P total gastrectomy and Roux-en-Y esophagojejunal anastomosis 12/29/2019   Severe obesity (BMI >= 40) ([place]) 12/21/2019   S/P gastric bypass 12/21/2019   Paresthesias 10/08/2019   Migraine headache without aura 09/22/2019   History of diabetes mellitus, type II 07/14/2019   Acute meniscal tear of left knee 01/19/2019   History of gestational diabetes 01/01/2019   Abnormal uterine bleeding 01/01/2019   Genital herpes simplex 11/14/2018   Seborrheic dermatitis of scalp 08/07/2018   Anxiety and depression 05/27/2018   Orthostasis 12/06/2017   Trochanteric bursitis, right hip 03/06/2016   Menorrhagia 10/12/2015   Fibroid 10/12/2015   S/P laparoscopic hysterectomy 10/12/2015   Lumbosacral strain 08/18/2014   Iron deficiency anemia, unspecified 10/20/2012   H/O laparoscopic adjustable gastric banding 02/2003 10/02/2012   Regurgitation 10/02/2012   S/P repeat low transverse C-section 07/17/2012   Sickle cell trait ([place]) 12/10/2011   Allergic rhinitis 06/14/2010   GERD 12/27/2008  Catamenial migraine 10/30/2005   RHINITIS, ALLERGIC 10/30/2005    REFERRING PROVIDER: Earlie Server, MD REFERRING DIAG: Post-op Left Knee Scope Medial Menisectomy Possible Micro Fracture ONSET DATE: 01/12/2021  THERAPY DIAG:  No diagnosis found.  PERTINENT HISTORY: N/A  PRECAUTIONS: None WEIGHT BEARING RESTRICTIONS: WBAT  SUBJECTIVE: ***  PAIN:  Are you having  pain? Yes VAS scale: 2/10 Pain location: Knee Pain orientation: Left  PAIN TYPE: Stiffness, sore Pain description: intermittent  PATIENT GOALS: Get back to working out (pilates and pure barr)   OBJECTIVE: ([place] THIS VISIT) PATIENT SURVEYS:  FOTO 47% functional status   LE AROM/PROM:   A/PROM Right 01/26/2021 Left 01/26/2021 Left 01/31/2021  02/02/2021  Knee flexion 135 83 (94 PROM) 120 (124 PROM, 133 following MET) 125  Knee extension 5 hyper 5 hyper      LE MMT:   MMT Right 01/26/2021 Left 01/26/2021  Hip flexion 4 4-  Hip extension 4 4-  Hip abduction 4 4-  Knee flexion 5 4-  Knee extension 5 Mod. quad activation    Slight patellar superior/inferior hypomobility   GAIT: Assistive device utilized: Crutches (single crutch) Level of assistance: Modified independence Comments: patient instructed on proper technique with single axillary crutch on right side    TODAY'S TREATMENT:  02/07/2021: Recumbent bike x 5 min - focus on knee flexion motion Supine quad set with towel roll under knee 2 x 10 with 5 sec hold Prone TKE 2 x 10 SAQ with leg lift 2 x 5 Quad set with hyperextension using towel 2 x 10 - focus on controlling end range knee hyperextension SLR 2 x 5   1/122023 Recumbent bike x 5 min - focus on knee flexion motion Supine quad set with towel roll under knee 2 x 10 with 5 sec hold Prone TKE 2 x 10 SAQ with leg lift 2 x 5 Quad set with hyperextension using towel 2 x 10 - focus on controlling end range knee hyperextension SLR 2 x 5  01/31/2021 Therapeutic Exercise: Seated LAQ 2x10 with 3-sec holds Supine knee flexion contract/ relax MET with strap for ROM x5 with 5-sec holds Supine SLR with abduction 2x10 Sidelying hip abduction 2x10 BIL Bridge with pillow squeeze 2x10 with 3-sec hold Mini-squat with UE support 2x10 Seated active hamstring stretch 2x1 minute on Lt Modalities: Vasopneumatic compression ([employer]) on Lt knee at 34  degrees x10 minutes   PATIENT EDUCATION:  Education details: HEP update Person educated: Patient Education method: Explanation, Demonstration, Tactile cues, Verbal cues, and Handouts Education comprehension: verbalized understanding, returned demonstration, verbal cues required, tactile cues required, and needs further education   HOME EXERCISE PROGRAM: Access Code: [email or web address]     ASSESSMENT: CLINICAL IMPRESSION: Patient tolerated therapy well with no adverse effects. *** Patient would benefit from continued skilled PT to progress her mobility and strength in order to reduce pain and maximize functional ability.  Therapy focused on improving quad activation and active hyperextension. She demonstrates much improved knee flexion range of motion but continues to demonstrate difficulty with quad activation and has a slight knee extension lag with SLR. She is unable to perform active hyperextension at this time.     GOALS: Goals reviewed with patient? Yes   SHORT TERM GOALS:   STG Name Target Date Goal status  1 Patient will be I with initial HEP in order to progress with therapy. Baseline: provided at eval 02/23/2021 INITIAL  2 PT will review FOTO with patient by 3rd visit in order to  understand expected progress and outcome with therapy. Baseline: assessed at eval 02/23/2021 INITIAL  3 Patient will achieve 110 deg active left knee flexion to improve gait and transfers Baseline: 83 deg 02/23/2021 INITIAL  4 Patient will be able to ambulate community level distances without AD to improve mobility Baseline: patient using single axillary crutch 02/23/2021 INITIAL    LONG TERM GOALS:    LTG Name Target Date Goal status  1 Patient will be I with final HEP to maintain progress from PT. Baseline: provided at eval 03/23/2021 INITIAL  2 Patient will report >/= 65% status on FOTO to indicate improved functional ability. Baseline: 47% 03/23/2021 INITIAL  3 Patient will achieve >/= 130 deg active left  knee flexion to return to exercise with limitation Baseline: 03/23/2021 INITIAL  4 Patient will demonstrate 5/5 left knee strength to normalize stair negotiation Baseline: moderate quad activation 03/23/2021 INITIAL  5 Patient will report </= 1/10 pain with exercise to return to active lifestyle Baseline: 4/10 03/23/2021 INITIAL     PLAN: PT FREQUENCY: 2x/week   PT DURATION: 8 weeks   PLANNED INTERVENTIONS: Therapeutic exercises, Therapeutic activity, Neuro Muscular re-education, Balance training, Gait training, Patient/Family education, Joint mobilization, Stair training, Aquatic Therapy, Dry Needling, Electrical stimulation, Cryotherapy, Moist heat, Taping, Vasopneumatic device, and Manual therapy   PLAN FOR NEXT SESSION: Review HEP and progress PRN, manual/stretching for knee flexion, quad activation and strengthening, initiate partial squats/step-ups    Hilda Blades, PT, DPT, LAT, ATC 02/06/21  1:16 PM Phone: [phone] Fax: [phone]

## 2021-02-07 ENCOUNTER — Ambulatory Visit: Payer: BC Managed Care – PPO | Admitting: Physical Therapy

## 2021-02-08 NOTE — Therapy (Signed)
OUTPATIENT PHYSICAL THERAPY TREATMENT NOTE   Patient Name: Jamie Mccarthy MRN: [id number] DOB:12-26-1975, 46 y.o., female 66 Date: 02/09/2021  PCP: Hali Marry, MD REFERRING PROVIDER: Earlie Server, MD   PT End of Session - 02/09/21 1006     Visit Number 4    Number of Visits 16    Date for PT Re-Evaluation 03/23/21    Authorization Type [employer] / [employer]    PT Start Time 1002    PT Stop Time 2355    PT Time Calculation (min) 43 min    Activity Tolerance Patient tolerated treatment well    Behavior During Therapy WFL for tasks assessed/performed               Past Medical History:  Diagnosis Date   Anemia    Has sickle cell trait   Anxiety    Depression    GERD (gastroesophageal reflux disease)    diet controlled   Gestational diabetes    H/O vaginal delivery 2010   Headache(784.0)    Migraines   Herpes 2007   hx of   Iron deficiency anemia, unspecified 10/20/2012   Neuromuscular disorder ([place])    Obese    PONV (postoperative nausea and vomiting)    has had PONV with previous c/s and also lap band surgery 2004   Raynaud's disease    Seasonal allergies    Past Surgical History:  Procedure Laterality Date   BILATERAL SALPINGECTOMY  07/16/2012   Procedure: BILATERAL DISTAL SALPINGECTOMY;  Surgeon: Logan Bores, MD;  Location: [facility];  Service: Obstetrics;;   CESAREAN SECTION  2012   CESAREAN SECTION N/A 07/16/2012   Procedure: REPEAT CESAREAN SECTION;  Surgeon: Logan Bores, MD;  Location: [facility];  Service: Obstetrics;  Laterality: N/A;   CHOLECYSTECTOMY N/A 11/14/2012   Procedure: LAPAROSCOPIC CHOLECYSTECTOMY;  Surgeon: Gayland Curry, MD;  Location: [facility];  Service: General;  Laterality: N/A;   GASTRIC BANDING PORT REVISION N/A 11/14/2012   Procedure: GASTRIC BANDING PORT REVISION;  Surgeon: Gayland Curry, MD;  Location: [facility];  Service: General;  Laterality: N/A;  PORT PLACEMENT MOVED NEW MESH INSERTED    GASTRIC  ROUX-EN-Y N/A 12/21/2019   Procedure: LAPAROSCOPIC ROUX-EN-Y GASTRIC BYPASS;  Surgeon: [name], MD;  Location: [facility];  Service: General;  Laterality: N/A;   LAPAROSCOPIC GASTRIC BANDING  2005   LAPAROSCOPIC VAGINAL HYSTERECTOMY WITH SALPINGECTOMY Bilateral 10/12/2015   Procedure: HYSTERECTOMY TOTAL LAPAROSCOPIC BILATERAL SALPINGECTOMY;  Surgeon: Sherlyn Hay, DO;  Location: [facility];  Service: Gynecology;  Laterality: Bilateral;   reposition gastric band  2006   x2   TUBAL LIGATION  2014   UPPER GI ENDOSCOPY N/A 12/21/2019   Procedure: UPPER GI ENDOSCOPY;  Surgeon: [name], MD;  Location: [facility];  Service: General;  Laterality: N/A;   [place]   Patient Active Problem List   Diagnosis Date Noted   Low libido 06/17/2020   Sleep disturbance 06/17/2020   Controlled type 2 diabetes mellitus with complication, without long-term current use of insulin ([place]) 03/08/2020   S/P total gastrectomy and Roux-en-Y esophagojejunal anastomosis 12/29/2019   Severe obesity (BMI >= 40) ([place]) 12/21/2019   S/P gastric bypass 12/21/2019   Paresthesias 10/08/2019   Migraine headache without aura 09/22/2019   History of diabetes mellitus, type II 07/14/2019   Acute meniscal tear of left knee 01/19/2019   History of gestational diabetes 01/01/2019   Abnormal uterine bleeding 01/01/2019   Genital  herpes simplex 11/14/2018   Seborrheic dermatitis of scalp 08/07/2018   Anxiety and depression 05/27/2018   Orthostasis 12/06/2017   Trochanteric bursitis, right hip 03/06/2016   Menorrhagia 10/12/2015   Fibroid 10/12/2015   S/P laparoscopic hysterectomy 10/12/2015   Lumbosacral strain 08/18/2014   Iron deficiency anemia, unspecified 10/20/2012   H/O laparoscopic adjustable gastric banding 02/2003 10/02/2012   Regurgitation 10/02/2012   S/P repeat low transverse C-section 07/17/2012   Sickle cell trait (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 She does note continued hip and lateral thigh tightness.  PAIN:  Are you having pain? Yes VAS scale: 2/10 Pain location: Knee Pain orientation: Left  PAIN TYPE: Stiffness, sore Pain description: intermittent  PATIENT GOALS: Get back to working out (pilates and pure barr)   OBJECTIVE: ([place] THIS VISIT) PATIENT SURVEYS:  FOTO 47% functional status   LE AROM/PROM:   A/PROM Right 01/26/2021 Left 01/26/2021 Left 01/31/2021  02/02/2021  Knee flexion 135 83 (94 PROM) 120 (124 PROM, 133 following MET) 125  Knee extension 5 hyper 5 hyper      LE MMT:   MMT Right 01/26/2021 Left 01/26/2021 Left 02/09/2021  Hip flexion 4 4-   Hip extension 4 4-   Hip abduction 4 4-   Knee flexion 5 4-   Knee extension 5 Mod. quad activation 4-; patient able to perform SLR without quad leag    Slight patellar superior/inferior hypomobility   GAIT: Assistive device utilized: Crutches (single crutch) Level of assistance: Modified independence Comments: patient instructed on proper technique with single axillary crutch on right side    TODAY'S TREATMENT: 02/09/2021: Recumbent bike x 5 min - focus on knee flexion motion Supine quad set with towel roll under knee 2 x 10 with 5 sec hold Piriformis stretch 2 x 30 sec SLR 2 x 10 Standing TKE with green 2 x 10 with 3 sec hold Step-up 4" box 2 x 10 - focus on slow and controlled lowering, allowing knee to bend and knee to translate forward to  encourage quad control Mini-squat at counter 2 x 10   1/122023 Recumbent bike x 5 min - focus on knee flexion motion Supine quad set with towel roll under knee 2 x 10 with 5 sec hold Prone TKE 2 x 10 SAQ with leg lift 2 x 5 Quad set with hyperextension using towel 2 x 10 - focus on controlling end range knee hyperextension SLR 2 x 5    PATIENT EDUCATION:  Education details: HEP update, continued focus on quad control, weaning from single crutch as she feels comfortable Person educated: Patient Education method: Explanation, Demonstration, Tactile cues, Verbal cues, and Handouts Education comprehension: verbalized understanding, returned demonstration, verbal cues required, tactile cues required, and needs further education   HOME EXERCISE PROGRAM: Access Code: [email or web address]     ASSESSMENT: CLINICAL IMPRESSION: Patient tolerated therapy well with no adverse effects. Patient demonstrates much improved quad control this visit, able to perform SLR without lag and ability to progress to standing closed  chain strength exercises. She does continue to report hesitancy with allowing her knee to bend in a weight bearing position, but able to improve with practice and cueing. Patient instructed to continue weaning from using the single crutch since is able to demonstrate better quad control. Updated HEP to include standing strengthening exercises with good tolerance. Patient would benefit from continued skilled PT to progress her mobility and strength in order to reduce pain and maximize functional ability.     GOALS: Goals reviewed with patient? Yes   SHORT TERM GOALS:   STG Name Target Date Goal status  1 Patient will be I with initial HEP in order to progress with therapy. Baseline: progressing with HEP 02/23/2021 ONGOING  2 PT will review FOTO with patient by 3rd visit in order to understand expected progress and outcome with therapy. Baseline: reviewed 02/23/2021 ACHIEVED  3 Patient will  achieve 110 deg active left knee flexion to improve gait and transfers Baseline: 125 deg on 02/02/21 02/23/2021 ACHIEVED  4 Patient will be able to ambulate community level distances without AD to improve mobility Baseline: patient weaning from single axillary crutch 02/23/2021 ONGOING    LONG TERM GOALS:    LTG Name Target Date Goal status  1 Patient will be I with final HEP to maintain progress from PT. Baseline: provided at eval 03/23/2021 INITIAL  2 Patient will report >/= 65% status on FOTO to indicate improved functional ability. Baseline: 47% 03/23/2021 INITIAL  3 Patient will achieve >/= 130 deg active left knee flexion to return to exercise with limitation Baseline: 03/23/2021 INITIAL  4 Patient will demonstrate 5/5 left knee strength to normalize stair negotiation Baseline: moderate quad activation 03/23/2021 INITIAL  5 Patient will report </= 1/10 pain with exercise to return to active lifestyle Baseline: 4/10 03/23/2021 INITIAL     PLAN: PT FREQUENCY: 2x/week   PT DURATION: 8 weeks   PLANNED INTERVENTIONS: Therapeutic exercises, Therapeutic activity, Neuro Muscular re-education, Balance training, Gait training, Patient/Family education, Joint mobilization, Stair training, Aquatic Therapy, Dry Needling, Electrical stimulation, Cryotherapy, Moist heat, Taping, Vasopneumatic device, and Manual therapy   PLAN FOR NEXT SESSION: Review HEP and progress PRN, Research officer, political party for continued strengthening, manual/stretching for knee flexion if needed, progress closed chain strengthening   Hilda Blades, PT, DPT, LAT, ATC 02/09/21  10:57 AM Phone: [phone] Fax: [phone]

## 2021-02-09 ENCOUNTER — Ambulatory Visit: Payer: BC Managed Care – PPO | Admitting: Physical Therapy

## 2021-02-09 ENCOUNTER — Other Ambulatory Visit: Payer: Self-pay

## 2021-02-09 ENCOUNTER — Encounter: Payer: Self-pay | Admitting: Physical Therapy

## 2021-02-09 DIAGNOSIS — M6281 Muscle weakness (generalized): Secondary | ICD-10-CM | POA: Diagnosis not present

## 2021-02-09 DIAGNOSIS — G8929 Other chronic pain: Secondary | ICD-10-CM

## 2021-02-09 DIAGNOSIS — M25562 Pain in left knee: Secondary | ICD-10-CM

## 2021-02-09 NOTE — Patient Instructions (Signed)
Access Code: [email or web address] URL: [email or web address] Date: 02/09/2021 Prepared by: Hilda Blades  Exercises Supine Quad Set - 5 x daily - 7 x weekly - 10 reps - 5 hold Long Sitting Quad Set with Towel Roll Under Heel - 2 x daily - 7 x weekly - 10 reps - 5 hold Active Straight Leg Raise with Quad Set - 2 x daily - 7 x weekly - 2 sets - 10 reps Supine Heel Slide with Strap - 2 x daily - 7 x weekly - 10 reps - 10 seconds hold Supine Hamstring Stretch with Strap - 2 x daily - 7 x weekly - 3 reps - 30 seconds hold Long Sitting Calf Stretch with Strap - 2 x daily - 7 x weekly - 3 reps - 30 seconds hold Sidelying Hip Abduction - 2 x daily - 7 x weekly - 2 sets - 10 reps Long Sitting Knee Extension with Towel Foot Lift - 2 x daily - 7 x weekly - 10 reps - 5 hold Standing Terminal Knee Extension with Resistance - 1-2 x daily - 7 x weekly - 2 sets - 10 reps - 3 seconds hold Mini Squat with Counter Support - 1-2 x daily - 7 x weekly - 2 sets - 10 reps

## 2021-02-14 ENCOUNTER — Ambulatory Visit: Payer: BC Managed Care – PPO | Admitting: Physical Therapy

## 2021-02-16 ENCOUNTER — Other Ambulatory Visit: Payer: Self-pay

## 2021-02-16 ENCOUNTER — Encounter: Payer: Self-pay | Admitting: Physical Therapy

## 2021-02-16 ENCOUNTER — Ambulatory Visit: Payer: BC Managed Care – PPO | Admitting: Physical Therapy

## 2021-02-16 DIAGNOSIS — M6281 Muscle weakness (generalized): Secondary | ICD-10-CM

## 2021-02-16 DIAGNOSIS — G8929 Other chronic pain: Secondary | ICD-10-CM

## 2021-02-16 DIAGNOSIS — M25562 Pain in left knee: Secondary | ICD-10-CM | POA: Diagnosis not present

## 2021-02-16 NOTE — Therapy (Signed)
OUTPATIENT PHYSICAL THERAPY TREATMENT NOTE   Patient Name: Jamie Mccarthy MRN: [id number] DOB:[birth year], 46 y.o., female 70 Date: 02/16/2021  PCP: Hali Marry, MD REFERRING PROVIDER: Earlie Server, MD   PT End of Session - 02/16/21 1016     Visit Number 5    Number of Visits 16    Date for PT Re-Evaluation 03/23/21    Authorization Type [employer] / [employer]    PT Start Time 1013    PT Stop Time 1100    PT Time Calculation (min) 47 min    Activity Tolerance Patient tolerated treatment well    Behavior During Therapy WFL for tasks assessed/performed                Past Medical History:  Diagnosis Date   Anemia    Has sickle cell trait   Anxiety    Depression    GERD (gastroesophageal reflux disease)    diet controlled   Gestational diabetes    H/O vaginal delivery 2010   Headache(784.0)    Migraines   Herpes 2007   hx of   Iron deficiency anemia, unspecified 10/20/2012   Neuromuscular disorder ([place])    Obese    PONV (postoperative nausea and vomiting)    has had PONV with previous c/s and also lap band surgery 2004   Raynaud's disease    Seasonal allergies    Past Surgical History:  Procedure Laterality Date   BILATERAL SALPINGECTOMY  07/16/2012   Procedure: BILATERAL DISTAL SALPINGECTOMY;  Surgeon: Logan Bores, MD;  Location: [facility];  Service: Obstetrics;;   CESAREAN SECTION  2012   CESAREAN SECTION N/A 07/16/2012   Procedure: REPEAT CESAREAN SECTION;  Surgeon: Logan Bores, MD;  Location: [facility];  Service: Obstetrics;  Laterality: N/A;   CHOLECYSTECTOMY N/A 11/14/2012   Procedure: LAPAROSCOPIC CHOLECYSTECTOMY;  Surgeon: Gayland Curry, MD;  Location: [facility];  Service: General;  Laterality: N/A;   GASTRIC BANDING PORT REVISION N/A 11/14/2012   Procedure: GASTRIC BANDING PORT REVISION;  Surgeon: Gayland Curry, MD;  Location: [facility];  Service: General;  Laterality: N/A;  PORT PLACEMENT MOVED NEW MESH INSERTED    GASTRIC  ROUX-EN-Y N/A 12/21/2019   Procedure: LAPAROSCOPIC ROUX-EN-Y GASTRIC BYPASS;  Surgeon: [name], MD;  Location: [facility];  Service: General;  Laterality: N/A;   LAPAROSCOPIC GASTRIC BANDING  2005   LAPAROSCOPIC VAGINAL HYSTERECTOMY WITH SALPINGECTOMY Bilateral 10/12/2015   Procedure: HYSTERECTOMY TOTAL LAPAROSCOPIC BILATERAL SALPINGECTOMY;  Surgeon: Sherlyn Hay, DO;  Location: [facility];  Service: Gynecology;  Laterality: Bilateral;   reposition gastric band  2006   x2   TUBAL LIGATION  2014   UPPER GI ENDOSCOPY N/A 12/21/2019   Procedure: UPPER GI ENDOSCOPY;  Surgeon: [name], MD;  Location: [facility];  Service: General;  Laterality: N/A;   [place]   Patient Active Problem List   Diagnosis Date Noted   Low libido 06/17/2020   Sleep disturbance 06/17/2020   Controlled type 2 diabetes mellitus with complication, without long-term current use of insulin ([place]) 03/08/2020   S/P total gastrectomy and Roux-en-Y esophagojejunal anastomosis 12/29/2019   Severe obesity (BMI >= 40) ([place]) 12/21/2019   S/P gastric bypass 12/21/2019   Paresthesias 10/08/2019   Migraine headache without aura 09/22/2019   History of diabetes mellitus, type II 07/14/2019   Acute meniscal tear of left knee 01/19/2019   History of gestational diabetes 01/01/2019   Abnormal uterine bleeding 01/01/2019  Genital herpes simplex 11/14/2018   Seborrheic dermatitis of scalp 08/07/2018   Anxiety and depression 05/27/2018   Orthostasis 12/06/2017   Trochanteric bursitis, right hip 03/06/2016   Menorrhagia 10/12/2015   Fibroid 10/12/2015   S/P laparoscopic hysterectomy 10/12/2015   Lumbosacral strain 08/18/2014   Iron deficiency anemia, unspecified 10/20/2012   H/O laparoscopic adjustable gastric banding 02/2003 10/02/2012   Regurgitation 10/02/2012   S/P repeat low transverse C-section 07/17/2012   Sickle cell trait ([place]) 12/10/2011   Allergic rhinitis 06/14/2010   GERD 12/27/2008    Catamenial migraine 10/30/2005   RHINITIS, ALLERGIC 10/30/2005    REFERRING PROVIDER: Earlie Server, MD REFERRING DIAG: Post-op Left Knee Scope Medial Menisectomy Possible Micro Fracture ONSET DATE: 01/12/2021  THERAPY DIAG:  Chronic pain of left knee  Muscle weakness (generalized)  PERTINENT HISTORY: N/A  PRECAUTIONS: None WEIGHT BEARING RESTRICTIONS: WBAT  SUBJECTIVE: walks in without crutch, still brings it with her just in case but only since earlier this week.   She just came from the MD/tech.  Gave her some Meloxicam.  Wants to get back to working out. Still working full time at [employer]. He released me unless I need him for something in the future.    PAIN:  Are you having pain? Yes VAS scale: 1/10 Pain location: Knee Pain orientation: Left  PAIN TYPE: Stiffness, sore Pain description: intermittent  PATIENT GOALS: Get back to working out (pilates and pure barre)   OBJECTIVE: ([place] THIS VISIT) PATIENT SURVEYS:  FOTO 47% functional status   LE AROM/PROM:   A/PROM Right 01/26/2021 Left 01/26/2021 Left 01/31/2021  02/02/2021  Knee flexion 135 83 (94 PROM) 120 (124 PROM, 133 following MET) 125  Knee extension 5 hyper 5 hyper      LE MMT:   MMT Right 01/26/2021 Left 01/26/2021 Left 02/09/2021  Hip flexion 4 4-   Hip extension 4 4-   Hip abduction 4 4-   Knee flexion 5 4-   Knee extension 5 Mod. quad activation 4-; patient able to perform SLR without quad leag    Slight patellar superior/inferior hypomobility   GAIT: Assistive device utilized: Crutches (single crutch) Level of assistance: Modified independence Comments: patient instructed on proper technique with single axillary crutch on right side   [facility] Adult PT Treatment:                                                DATE: 02/16/21 Therapeutic Exercise: Recumbent bike L 2 for 6 min  Pilates Reformer used for LE/core strength, postural strength, lumbopelvic disassociation and core  control.  Exercises included: Footwork 2 red 1 blue parallel heels, toes in narrow and parallel  Double and single leg press out, min cues for knee control and avoiding hyperextension Knee extension x 10, SLR x 10 with hip ER for VMO  Bridging 2 red 1 blue  Green band x 10 then added clam x 10 small ROM  Feet in Straps 1 red 1 yellow  Arcs, Squats  and circles , min cues for pelvic control, quad fatigue      TODAY'S TREATMENT: 02/09/2021: Recumbent bike x 5 min - focus on knee flexion motion Supine quad set with towel roll under knee 2 x 10 with 5 sec hold Piriformis stretch 2 x 30 sec SLR 2 x 10 Standing TKE with green 2 x 10  with 3 sec hold Step-up 4" box 2 x 10 - focus on slow and controlled lowering, allowing knee to bend and knee to translate forward to encourage quad control Mini-squat at counter 2 x 10   1/122023 Recumbent bike x 5 min - focus on knee flexion motion Supine quad set with towel roll under knee 2 x 10 with 5 sec hold Prone TKE 2 x 10 SAQ with leg lift 2 x 5 Quad set with hyperextension using towel 2 x 10 - focus on controlling end range knee hyperextension SLR 2 x 5    PATIENT EDUCATION:  Education details: HEP update, continued focus on quad control, weaning from single crutch as she feels comfortable Person educated: Patient Education method: Explanation, Demonstration, Tactile cues, Verbal cues, and Handouts Education comprehension: verbalized understanding, returned demonstration, verbal cues required, tactile cues required, and needs further education   HOME EXERCISE PROGRAM: Access Code: [email or web address]     ASSESSMENT: CLINICAL IMPRESSION: Patient was able to use the Pilates Reformer for basic LE work, focusing on quad strength, hamstring flexibility.  She had less stiffness and pain in L knee and showed some quad fatigue.  Was pleased to be back on the equipment. Cont POC, scheduled more visits thru 2/23.    GOALS: Goals reviewed with patient? Yes    SHORT TERM GOALS:   STG Name Target Date Goal status  1 Patient will be I with initial HEP in order to progress with therapy. Baseline: progressing with HEP 02/23/2021 ONGOING  2 PT will review FOTO with patient by 3rd visit in order to understand expected progress and outcome with therapy. Baseline: reviewed 02/23/2021 ACHIEVED  3 Patient will achieve 110 deg active left knee flexion to improve gait and transfers Baseline: 125 deg on 02/02/21 02/23/2021 ACHIEVED  4 Patient will be able to ambulate community level distances without AD to improve mobility Baseline: patient weaning from single axillary crutch 02/23/2021 ONGOING    LONG TERM GOALS:    LTG Name Target Date Goal status  1 Patient will be I with final HEP to maintain progress from PT. Baseline: provided at eval 03/23/2021 INITIAL  2 Patient will report >/= 65% status on FOTO to indicate improved functional ability. Baseline: 47% 03/23/2021 INITIAL  3 Patient will achieve >/= 130 deg active left knee flexion to return to exercise with limitation Baseline: 03/23/2021 INITIAL  4 Patient will demonstrate 5/5 left knee strength to normalize stair negotiation Baseline: moderate quad activation 03/23/2021 INITIAL  5 Patient will report </= 1/10 pain with exercise to return to active lifestyle Baseline: 4/10 03/23/2021 INITIAL     PLAN: PT FREQUENCY: 2x/week   PT DURATION: 8 weeks   PLANNED INTERVENTIONS: Therapeutic exercises, Therapeutic activity, Neuro Muscular re-education, Balance training, Gait training, Patient/Family education, Joint mobilization, Stair training, Aquatic Therapy, Dry Needling, Electrical stimulation, Cryotherapy, Moist heat, Taping, Vasopneumatic device, and Manual therapy   PLAN FOR NEXT SESSION: Review HEP and progress PRN, Research officer, political party for continued strengthening, manual/stretching for knee flexion if needed, progress closed chain strengthening Gait with AD.  Raeford Razor, PT 02/16/21 6:47 PM Phone:  [phone] Fax: [phone]

## 2021-02-20 NOTE — Therapy (Incomplete)
OUTPATIENT PHYSICAL THERAPY TREATMENT NOTE   Patient Name: Jamie Mccarthy MRN: [id number] DOB:03/16/1975, 46 y.o., female Today's Date: 02/20/2021  PCP: Hali Marry, MD REFERRING PROVIDER: Hali Marry, *        Past Medical History:  Diagnosis Date   Anemia    Has sickle cell trait   Anxiety    Depression    GERD (gastroesophageal reflux disease)    diet controlled   Gestational diabetes    H/O vaginal delivery 2010   Headache(784.0)    Migraines   Herpes 2007   hx of   Iron deficiency anemia, unspecified 10/20/2012   Neuromuscular disorder ([place])    Obese    PONV (postoperative nausea and vomiting)    has had PONV with previous c/s and also lap band surgery 2004   Raynaud's disease    Seasonal allergies    Past Surgical History:  Procedure Laterality Date   BILATERAL SALPINGECTOMY  07/16/2012   Procedure: BILATERAL DISTAL SALPINGECTOMY;  Surgeon: Logan Bores, MD;  Location: [facility];  Service: Obstetrics;;   CESAREAN SECTION  2012   CESAREAN SECTION N/A 07/16/2012   Procedure: REPEAT CESAREAN SECTION;  Surgeon: Logan Bores, MD;  Location: [facility];  Service: Obstetrics;  Laterality: N/A;   CHOLECYSTECTOMY N/A 11/14/2012   Procedure: LAPAROSCOPIC CHOLECYSTECTOMY;  Surgeon: Gayland Curry, MD;  Location: [facility];  Service: General;  Laterality: N/A;   GASTRIC BANDING PORT REVISION N/A 11/14/2012   Procedure: GASTRIC BANDING PORT REVISION;  Surgeon: Gayland Curry, MD;  Location: [facility];  Service: General;  Laterality: N/A;  PORT PLACEMENT MOVED NEW MESH INSERTED    GASTRIC ROUX-EN-Y N/A 12/21/2019   Procedure: LAPAROSCOPIC ROUX-EN-Y GASTRIC BYPASS;  Surgeon: [name], MD;  Location: [facility];  Service: General;  Laterality: N/A;   LAPAROSCOPIC GASTRIC BANDING  2005   LAPAROSCOPIC VAGINAL HYSTERECTOMY WITH SALPINGECTOMY Bilateral 10/12/2015   Procedure: HYSTERECTOMY TOTAL LAPAROSCOPIC BILATERAL SALPINGECTOMY;  Surgeon: Sherlyn Hay,  DO;  Location: [facility];  Service: Gynecology;  Laterality: Bilateral;   reposition gastric band  2006   x2   TUBAL LIGATION  2014   UPPER GI ENDOSCOPY N/A 12/21/2019   Procedure: UPPER GI ENDOSCOPY;  Surgeon: [name], MD;  Location: [facility];  Service: General;  Laterality: N/A;   [place]   Patient Active Problem List   Diagnosis Date Noted   Low libido 06/17/2020   Sleep disturbance 06/17/2020   Controlled type 2 diabetes mellitus with complication, without long-term current use of insulin ([place]) 03/08/2020   S/P total gastrectomy and Roux-en-Y esophagojejunal anastomosis 12/29/2019   Severe obesity (BMI >= 40) ([place]) 12/21/2019   S/P gastric bypass 12/21/2019   Paresthesias 10/08/2019   Migraine headache without aura 09/22/2019   History of diabetes mellitus, type II 07/14/2019   Acute meniscal tear of left knee 01/19/2019   History of gestational diabetes 01/01/2019   Abnormal uterine bleeding 01/01/2019   Genital herpes simplex 11/14/2018   Seborrheic dermatitis of scalp 08/07/2018   Anxiety and depression 05/27/2018   Orthostasis 12/06/2017   Trochanteric bursitis, right hip 03/06/2016   Menorrhagia 10/12/2015   Fibroid 10/12/2015   S/P laparoscopic hysterectomy 10/12/2015   Lumbosacral strain 08/18/2014   Iron deficiency anemia, unspecified 10/20/2012   H/O laparoscopic adjustable gastric banding 02/2003 10/02/2012   Regurgitation 10/02/2012   S/P repeat low transverse C-section 07/17/2012   Sickle cell trait ([place]) 12/10/2011   Allergic rhinitis 06/14/2010   GERD  12/27/2008   Catamenial migraine 10/30/2005   RHINITIS, ALLERGIC 10/30/2005    REFERRING PROVIDER: Earlie Server, MD REFERRING DIAG: Post-op Left Knee Scope Medial Menisectomy Possible Micro Fracture ONSET DATE: 01/12/2021  THERAPY DIAG:  No diagnosis found.  PERTINENT HISTORY: N/A  PRECAUTIONS: None WEIGHT BEARING RESTRICTIONS: WBAT  SUBJECTIVE: walks in without crutch,  still brings it with her just in case but only since earlier this week.   She just came from the MD/tech.  Gave her some Meloxicam.  Wants to get back to working out. Still working full time at [employer]. He released me unless I need him for something in the future.    PAIN:  Are you having pain? Yes VAS scale: 1/10 Pain location: Knee Pain orientation: Left  PAIN TYPE: Stiffness, sore Pain description: intermittent  PATIENT GOALS: Get back to working out (pilates and pure barre)   OBJECTIVE: ([place] THIS VISIT) PATIENT SURVEYS:  FOTO 47% functional status   LE AROM/PROM:   A/PROM Right 01/26/2021 Left 01/26/2021 Left 01/31/2021  02/02/2021  Knee flexion 135 83 (94 PROM) 120 (124 PROM, 133 following MET) 125  Knee extension 5 hyper 5 hyper      LE MMT:   MMT Right 01/26/2021 Left 01/26/2021 Left 02/09/2021  Hip flexion 4 4-   Hip extension 4 4-   Hip abduction 4 4-   Knee flexion 5 4-   Knee extension 5 Mod. quad activation 4-; patient able to perform SLR without quad leag    Slight patellar superior/inferior hypomobility   GAIT: Assistive device utilized: Crutches (single crutch) Level of assistance: Modified independence Comments: patient instructed on proper technique with single axillary crutch on right side   [facility] Adult PT Treatment:                                                DATE: 02/16/21 Therapeutic Exercise: Recumbent bike L 2 for 6 min  Pilates Reformer used for LE/core strength, postural strength, lumbopelvic disassociation and core control.  Exercises included: Footwork 2 red 1 blue parallel heels, toes in narrow and parallel  Double and single leg press out, min cues for knee control and avoiding hyperextension Knee extension x 10, SLR x 10 with hip ER for VMO  Bridging 2 red 1 blue  Green band x 10 then added clam x 10 small ROM  Feet in Straps 1 red 1 yellow  Arcs, Squats  and circles , min cues for pelvic control, quad fatigue       TODAY'S TREATMENT: 02/09/2021: Recumbent bike x 5 min - focus on knee flexion motion Supine quad set with towel roll under knee 2 x 10 with 5 sec hold Piriformis stretch 2 x 30 sec SLR 2 x 10 Standing TKE with green 2 x 10 with 3 sec hold Step-up 4" box 2 x 10 - focus on slow and controlled lowering, allowing knee to bend and knee to translate forward to encourage quad control Mini-squat at counter 2 x 10   1/122023 Recumbent bike x 5 min - focus on knee flexion motion Supine quad set with towel roll under knee 2 x 10 with 5 sec hold Prone TKE 2 x 10 SAQ with leg lift 2 x 5 Quad set with hyperextension using towel 2 x 10 - focus on controlling end range knee hyperextension SLR 2  x 5    PATIENT EDUCATION:  Education details: HEP update, continued focus on quad control, weaning from single crutch as she feels comfortable Person educated: Patient Education method: Explanation, Demonstration, Tactile cues, Verbal cues, and Handouts Education comprehension: verbalized understanding, returned demonstration, verbal cues required, tactile cues required, and needs further education   HOME EXERCISE PROGRAM: Access Code: [email or web address]     ASSESSMENT: CLINICAL IMPRESSION: Patient was able to use the Pilates Reformer for basic LE work, focusing on quad strength, hamstring flexibility.  She had less stiffness and pain in L knee and showed some quad fatigue.  Was pleased to be back on the equipment. Cont POC, scheduled more visits thru 2/23.    GOALS: Goals reviewed with patient? Yes   SHORT TERM GOALS:   STG Name Target Date Goal status  1 Patient will be I with initial HEP in order to progress with therapy. Baseline: progressing with HEP 02/23/2021 ONGOING  2 PT will review FOTO with patient by 3rd visit in order to understand expected progress and outcome with therapy. Baseline: reviewed 02/23/2021 ACHIEVED  3 Patient will achieve 110 deg active left knee flexion to improve gait and  transfers Baseline: 125 deg on 02/02/21 02/23/2021 ACHIEVED  4 Patient will be able to ambulate community level distances without AD to improve mobility Baseline: patient weaning from single axillary crutch 02/23/2021 ONGOING    LONG TERM GOALS:    LTG Name Target Date Goal status  1 Patient will be I with final HEP to maintain progress from PT. Baseline: provided at eval 03/23/2021 INITIAL  2 Patient will report >/= 65% status on FOTO to indicate improved functional ability. Baseline: 47% 03/23/2021 INITIAL  3 Patient will achieve >/= 130 deg active left knee flexion to return to exercise with limitation Baseline: 03/23/2021 INITIAL  4 Patient will demonstrate 5/5 left knee strength to normalize stair negotiation Baseline: moderate quad activation 03/23/2021 INITIAL  5 Patient will report </= 1/10 pain with exercise to return to active lifestyle Baseline: 4/10 03/23/2021 INITIAL     PLAN: PT FREQUENCY: 2x/week   PT DURATION: 8 weeks   PLANNED INTERVENTIONS: Therapeutic exercises, Therapeutic activity, Neuro Muscular re-education, Balance training, Gait training, Patient/Family education, Joint mobilization, Stair training, Aquatic Therapy, Dry Needling, Electrical stimulation, Cryotherapy, Moist heat, Taping, Vasopneumatic device, and Manual therapy   PLAN FOR NEXT SESSION: Review HEP and progress PRN, Research officer, political party for continued strengthening, manual/stretching for knee flexion if needed, progress closed chain strengthening Gait with AD.  Raeford Razor, PT 02/20/21 12:40 PM Phone: [phone] Fax: [phone]

## 2021-02-21 ENCOUNTER — Ambulatory Visit: Payer: BC Managed Care – PPO | Admitting: Physical Therapy

## 2021-02-22 NOTE — Therapy (Signed)
OUTPATIENT PHYSICAL THERAPY TREATMENT NOTE   Patient Name: Jamie Mccarthy MRN: [id number] DOB:Nov 29, 1975, 46 y.o., female 64 Date: 02/23/2021  PCP: Hali Marry, MD REFERRING PROVIDER: Earlie Server, MD   PT End of Session - 02/23/21 1021     Visit Number 6    Number of Visits 16    Date for PT Re-Evaluation 03/23/21    Authorization Type BCBS / [employer]    PT Start Time 1018    PT Stop Time 1110    PT Time Calculation (min) 52 min    Activity Tolerance Patient tolerated treatment well    Behavior During Therapy WFL for tasks assessed/performed                 Past Medical History:  Diagnosis Date   Anemia    Has sickle cell trait   Anxiety    Depression    GERD (gastroesophageal reflux disease)    diet controlled   Gestational diabetes    H/O vaginal delivery 2010   Headache(784.0)    Migraines   Herpes 2007   hx of   Iron deficiency anemia, unspecified 10/20/2012   Neuromuscular disorder ([place])    Obese    PONV (postoperative nausea and vomiting)    has had PONV with previous c/s and also lap band surgery 2004   Raynaud's disease    Seasonal allergies    Past Surgical History:  Procedure Laterality Date   BILATERAL SALPINGECTOMY  07/16/2012   Procedure: BILATERAL DISTAL SALPINGECTOMY;  Surgeon: Logan Bores, MD;  Location: [facility];  Service: Obstetrics;;   CESAREAN SECTION  2012   CESAREAN SECTION N/A 07/16/2012   Procedure: REPEAT CESAREAN SECTION;  Surgeon: Logan Bores, MD;  Location: [facility];  Service: Obstetrics;  Laterality: N/A;   CHOLECYSTECTOMY N/A 11/14/2012   Procedure: LAPAROSCOPIC CHOLECYSTECTOMY;  Surgeon: Gayland Curry, MD;  Location: [facility];  Service: General;  Laterality: N/A;   GASTRIC BANDING PORT REVISION N/A 11/14/2012   Procedure: GASTRIC BANDING PORT REVISION;  Surgeon: Gayland Curry, MD;  Location: [facility];  Service: General;  Laterality: N/A;  PORT PLACEMENT MOVED NEW MESH INSERTED    GASTRIC  ROUX-EN-Y N/A 12/21/2019   Procedure: LAPAROSCOPIC ROUX-EN-Y GASTRIC BYPASS;  Surgeon: [name], MD;  Location: [facility];  Service: General;  Laterality: N/A;   LAPAROSCOPIC GASTRIC BANDING  2005   LAPAROSCOPIC VAGINAL HYSTERECTOMY WITH SALPINGECTOMY Bilateral 10/12/2015   Procedure: HYSTERECTOMY TOTAL LAPAROSCOPIC BILATERAL SALPINGECTOMY;  Surgeon: Sherlyn Hay, DO;  Location: [facility];  Service: Gynecology;  Laterality: Bilateral;   reposition gastric band  2006   x2   TUBAL LIGATION  2014   UPPER GI ENDOSCOPY N/A 12/21/2019   Procedure: UPPER GI ENDOSCOPY;  Surgeon: [name], MD;  Location: [facility];  Service: General;  Laterality: N/A;   [place]   Patient Active Problem List   Diagnosis Date Noted   Low libido 06/17/2020   Sleep disturbance 06/17/2020   Controlled type 2 diabetes mellitus with complication, without long-term current use of insulin ([place]) 03/08/2020   S/P total gastrectomy and Roux-en-Y esophagojejunal anastomosis 12/29/2019   Severe obesity (BMI >= 40) ([place]) 12/21/2019   S/P gastric bypass 12/21/2019   Paresthesias 10/08/2019   Migraine headache without aura 09/22/2019   History of diabetes mellitus, type II 07/14/2019   Acute meniscal tear of left knee 01/19/2019   History of gestational diabetes 01/01/2019   Abnormal uterine bleeding 01/01/2019  Genital herpes simplex 11/14/2018   Seborrheic dermatitis of scalp 08/07/2018   Anxiety and depression 05/27/2018   Orthostasis 12/06/2017   Trochanteric bursitis, right hip 03/06/2016   Menorrhagia 10/12/2015   Fibroid 10/12/2015   S/P laparoscopic hysterectomy 10/12/2015   Lumbosacral strain 08/18/2014   Iron deficiency anemia, unspecified 10/20/2012   H/O laparoscopic adjustable gastric banding 02/2003 10/02/2012   Regurgitation 10/02/2012   S/P repeat low transverse C-section 07/17/2012   Sickle cell trait ([place]) 12/10/2011   Allergic rhinitis 06/14/2010   GERD 12/27/2008    Catamenial migraine 10/30/2005   RHINITIS, ALLERGIC 10/30/2005    REFERRING PROVIDER: Earlie Server, MD REFERRING DIAG: Post-op Left Knee Scope Medial Menisectomy Possible Micro Fracture ONSET DATE: 01/12/2021  THERAPY DIAG:  Chronic pain of left knee  Muscle weakness (generalized)  PERTINENT HISTORY: N/A  PRECAUTIONS: None WEIGHT BEARING RESTRICTIONS: WBAT  SUBJECTIVE:  It is just so stiff and hard to get warm.  It swells if Im up on it too long. (2 hours or more)   PAIN:  Are you having pain? no VAS scale: 0/10 knee stiffness  Pain location: Knee Pain orientation: Left  PAIN TYPE: Stiffness, sore Pain description: intermittent  PATIENT GOALS: Get back to working out (pilates and pure barre)   OBJECTIVE: (BOLDED MEASURES ASSESSED THIS VISIT) PATIENT SURVEYS:  FOTO 47% functional status   LE AROM/PROM:   A/PROM Right 01/26/2021 Left 01/26/2021 Left 01/31/2021 Left 02/02/2021  Knee flexion 135 83 (94 PROM) 120 (124 PROM, 133 following MET) 125  Knee extension 5 hyper 5 hyper      LE MMT:   MMT Right 01/26/2021 Left 01/26/2021 Left 02/09/2021  Hip flexion 4 4-   Hip extension 4 4-   Hip abduction 4 4-   Knee flexion 5 4-   Knee extension 5 Mod. quad activation 4-; patient able to perform SLR without quad leag    Slight patellar superior/inferior hypomobility      [facility] Adult PT Treatment:                                                DATE: 02/23/21 Therapeutic Exercise: Recumbent bike L 2 for 6 min  Wall squats with toes pressing x 10 then heels pressing  Wall sit hold for 15 sec x 3 partial range Seated ball hold with Lt LAQ , 5 sec hold 4 lbs  SLR hip flexion x 10 SLR hip abduction and extension x 10 each Sit to stand x 10 with 10 lbs  Single leg stance on foam pad L knee multiple trials  Added hip abduction with light hand assist at countertop standing on foam   Modalities: Cold pack L knee 8 min    [facility] Adult PT Treatment:                                                 DATE: 02/16/21 Therapeutic Exercise: Recumbent bike L 2 for 6 min  Pilates Reformer used for LE/core strength, postural strength, lumbopelvic disassociation and core control.  Exercises included: Footwork 2 red 1 blue parallel heels, toes in narrow and parallel  Double and single leg press out, min cues for knee control and avoiding hyperextension Knee extension x 10,  SLR x 10 with hip ER for VMO  Bridging 2 red 1 blue  Green band x 10 then added clam x 10 small ROM  Feet in Straps 1 red 1 yellow  Arcs, Squats  and circles , min cues for pelvic control, quad fatigue      TODAY'S TREATMENT: 02/09/2021: Recumbent bike x 5 min - focus on knee flexion motion Supine quad set with towel roll under knee 2 x 10 with 5 sec hold Piriformis stretch 2 x 30 sec SLR 2 x 10 Standing TKE with green 2 x 10 with 3 sec hold Step-up 4" box 2 x 10 - focus on slow and controlled lowering, allowing knee to bend and knee to translate forward to encourage quad control Mini-squat at counter 2 x 10  PATIENT EDUCATION:  Education details: knee anatomy  Person educated: Patient Education method: Explanation, Demonstration, Tactile cues, Verbal cues, and Handouts Education comprehension: verbalized understanding, returned demonstration, verbal cues required, tactile cues required, and needs further education   HOME EXERCISE PROGRAM: Access Code: [email or web address]     ASSESSMENT: CLINICAL IMPRESSION: Patient with continued stiffness with ADLs and walking.  She continues to have swelling if sitting too long.  Able to tolerate strengthening and begin stability in single leg stance without increasing pain.     GOALS: Goals reviewed with patient? Yes   SHORT TERM GOALS:   STG Name Target Date Goal status  1 Patient will be I with initial HEP in order to progress with therapy. Baseline: progressing with HEP 02/23/2021 ACHEIVED  2 PT will review FOTO with patient by 3rd visit in order to understand  expected progress and outcome with therapy. Baseline: reviewed 02/23/2021 ACHIEVED  3 Patient will achieve 110 deg active left knee flexion to improve gait and transfers Baseline: 125 deg on 02/02/21 02/23/2021 ACHIEVED  4 Patient will be able to ambulate community level distances without AD to improve mobility Baseline: patient weaning from single axillary crutch 02/23/2021 ACHIEVED     LONG TERM GOALS:    LTG Name Target Date Goal status  1 Patient will be I with final HEP to maintain progress from PT. Baseline: provided at eval 03/23/2021 INITIAL  2 Patient will report >/= 65% status on FOTO to indicate improved functional ability. Baseline: 47% 03/23/2021 INITIAL  3 Patient will achieve >/= 130 deg active left knee flexion to return to exercise with limitation Baseline: 03/23/2021 INITIAL  4 Patient will demonstrate 5/5 left knee strength to normalize stair negotiation Baseline: moderate quad activation 03/23/2021 INITIAL  5 Patient will report </= 1/10 pain with exercise to return to active lifestyle Baseline: 4/10 03/23/2021 INITIAL     PLAN: PT FREQUENCY: 2x/week   PT DURATION: 8 weeks   PLANNED INTERVENTIONS: Therapeutic exercises, Therapeutic activity, Neuro Muscular re-education, Balance training, Gait training, Patient/Family education, Joint mobilization, Stair training, Aquatic Therapy, Dry Needling, Electrical stimulation, Cryotherapy, Moist heat, Taping, Vasopneumatic device, and Manual therapy   PLAN FOR NEXT SESSION: Review HEP and progress balance and stability PRN, Saralyn Pilar, PT 02/23/21 2:12 PM Phone: [phone] Fax: [phone]     Raeford Razor, PT 02/23/21 2:17 PM Phone: [phone] Fax: [phone]

## 2021-02-23 ENCOUNTER — Other Ambulatory Visit: Payer: Self-pay

## 2021-02-23 ENCOUNTER — Ambulatory Visit: Payer: BC Managed Care – PPO | Attending: Orthopedic Surgery | Admitting: Physical Therapy

## 2021-02-23 ENCOUNTER — Encounter: Payer: Self-pay | Admitting: Physical Therapy

## 2021-02-23 DIAGNOSIS — M25562 Pain in left knee: Secondary | ICD-10-CM | POA: Insufficient documentation

## 2021-02-23 DIAGNOSIS — M6281 Muscle weakness (generalized): Secondary | ICD-10-CM | POA: Insufficient documentation

## 2021-02-23 DIAGNOSIS — G8929 Other chronic pain: Secondary | ICD-10-CM | POA: Diagnosis not present

## 2021-02-28 ENCOUNTER — Encounter: Payer: Self-pay | Admitting: Physical Therapy

## 2021-02-28 ENCOUNTER — Ambulatory Visit: Payer: BC Managed Care – PPO | Admitting: Physical Therapy

## 2021-02-28 ENCOUNTER — Other Ambulatory Visit: Payer: Self-pay

## 2021-02-28 DIAGNOSIS — G8929 Other chronic pain: Secondary | ICD-10-CM

## 2021-02-28 DIAGNOSIS — M6281 Muscle weakness (generalized): Secondary | ICD-10-CM

## 2021-02-28 DIAGNOSIS — M25562 Pain in left knee: Secondary | ICD-10-CM | POA: Diagnosis not present

## 2021-02-28 NOTE — Therapy (Signed)
OUTPATIENT PHYSICAL THERAPY TREATMENT NOTE   Patient Name: Jamie Mccarthy MRN: [id number] DOB:1975-11-07, 46 y.o., female 29 Date: 02/28/2021  PCP: Hali Marry, MD REFERRING PROVIDER: Hali Marry, *   PT End of Session - 02/28/21 1150     Visit Number 7    Number of Visits 16    Date for PT Re-Evaluation 03/23/21    Authorization Type [employer]BS / [employer]    PT Start Time 1150    PT Stop Time 1239    PT Time Calculation (min) 49 min    Activity Tolerance Patient tolerated treatment well    Behavior During Therapy WFL for tasks assessed/performed                  Past Medical History:  Diagnosis Date   Anemia    Has sickle cell trait   Anxiety    Depression    GERD (gastroesophageal reflux disease)    diet controlled   Gestational diabetes    H/O vaginal delivery 2010   Headache(784.0)    Migraines   Herpes 2007   hx of   Iron deficiency anemia, unspecified 10/20/2012   Neuromuscular disorder ([place])    Obese    PONV (postoperative nausea and vomiting)    has had PONV with previous c/s and also lap band surgery 2004   Raynaud's disease    Seasonal allergies    Past Surgical History:  Procedure Laterality Date   BILATERAL SALPINGECTOMY  07/16/2012   Procedure: BILATERAL DISTAL SALPINGECTOMY;  Surgeon: Logan Bores, MD;  Location: [facility];  Service: Obstetrics;;   CESAREAN SECTION  2012   CESAREAN SECTION N/A 07/16/2012   Procedure: REPEAT CESAREAN SECTION;  Surgeon: Logan Bores, MD;  Location: [facility];  Service: Obstetrics;  Laterality: N/A;   CHOLECYSTECTOMY N/A 11/14/2012   Procedure: LAPAROSCOPIC CHOLECYSTECTOMY;  Surgeon: Gayland Curry, MD;  Location: [facility];  Service: General;  Laterality: N/A;   GASTRIC BANDING PORT REVISION N/A 11/14/2012   Procedure: GASTRIC BANDING PORT REVISION;  Surgeon: Gayland Curry, MD;  Location: [facility];  Service: General;  Laterality: N/A;  PORT PLACEMENT MOVED NEW MESH INSERTED     GASTRIC ROUX-EN-Y N/A 12/21/2019   Procedure: LAPAROSCOPIC ROUX-EN-Y GASTRIC BYPASS;  Surgeon: [name], MD;  Location: [facility];  Service: General;  Laterality: N/A;   LAPAROSCOPIC GASTRIC BANDING  2005   LAPAROSCOPIC VAGINAL HYSTERECTOMY WITH SALPINGECTOMY Bilateral 10/12/2015   Procedure: HYSTERECTOMY TOTAL LAPAROSCOPIC BILATERAL SALPINGECTOMY;  Surgeon: Sherlyn Hay, DO;  Location: [facility];  Service: Gynecology;  Laterality: Bilateral;   reposition gastric band  2006   x2   TUBAL LIGATION  2014   UPPER GI ENDOSCOPY N/A 12/21/2019   Procedure: UPPER GI ENDOSCOPY;  Surgeon: [name], MD;  Location: [facility];  Service: General;  Laterality: N/A;   [place]   Patient Active Problem List   Diagnosis Date Noted   Low libido 06/17/2020   Sleep disturbance 06/17/2020   Controlled type 2 diabetes mellitus with complication, without long-term current use of insulin ([place]) 03/08/2020   S/P total gastrectomy and Roux-en-Y esophagojejunal anastomosis 12/29/2019   Severe obesity (BMI >= 40) ([place]) 12/21/2019   S/P gastric bypass 12/21/2019   Paresthesias 10/08/2019   Migraine headache without aura 09/22/2019   History of diabetes mellitus, type II 07/14/2019   Acute meniscal tear of left knee 01/19/2019   History of gestational diabetes 01/01/2019   Abnormal uterine bleeding  01/01/2019   Genital herpes simplex 11/14/2018   Seborrheic dermatitis of scalp 08/07/2018   Anxiety and depression 05/27/2018   Orthostasis 12/06/2017   Trochanteric bursitis, right hip 03/06/2016   Menorrhagia 10/12/2015   Fibroid 10/12/2015   S/P laparoscopic hysterectomy 10/12/2015   Lumbosacral strain 08/18/2014   Iron deficiency anemia, unspecified 10/20/2012   H/O laparoscopic adjustable gastric banding 02/2003 10/02/2012   Regurgitation 10/02/2012   S/P repeat low transverse C-section 07/17/2012   Sickle cell trait ([place]) 12/10/2011   Allergic rhinitis 06/14/2010   GERD 12/27/2008    Catamenial migraine 10/30/2005   RHINITIS, ALLERGIC 10/30/2005    REFERRING PROVIDER: Earlie Server, MD REFERRING DIAG: Post-op Left Knee Scope Medial Menisectomy Possible Micro Fracture ONSET DATE: 01/12/2021  THERAPY DIAG:  Chronic pain of left knee  Muscle weakness (generalized)  PERTINENT HISTORY: N/A  PRECAUTIONS: None WEIGHT BEARING RESTRICTIONS: WBAT  SUBJECTIVE:  Knee is stiff.  I feel like my knees don't look the same.  PAIN:  Are you having pain? no VAS scale: 0/10 knee stiffness  Pain location: Knee Pain orientation: Left  PAIN TYPE: Stiffness, sore Pain description: intermittent  PATIENT GOALS: Get back to working out (pilates and pure barre)   OBJECTIVE: (BOLDED MEASURES ASSESSED THIS VISIT) PATIENT SURVEYS:  FOTO 47% functional status   LE AROM/PROM:   A/PROM Right 01/26/2021 Left 01/26/2021 Left 01/31/2021 Left 02/02/2021  Knee flexion 135 83 (94 PROM) 120 (124 PROM, 133 following MET) 125  Knee extension 5 hyper 5 hyper      LE MMT:   MMT Right 01/26/2021 Left 01/26/2021 Left 02/09/2021  Hip flexion 4 4-   Hip extension 4 4-   Hip abduction 4 4-   Knee flexion 5 4-   Knee extension 5 Mod. quad activation 4-; patient able to perform SLR without quad leag    Slight patellar superior/inferior hypomobility    [facility] Adult PT Treatment:                                                DATE: 02/28/21 Therapeutic Exercise: Recumbent bike 6 min L 2  Standing knee mobility, knee flexion and then extension x 10 sec x 6  Squat on Airex pad x 10, added knee lift  x 10 each LE , light hand hold needed  Slow march with 5 sec hold x 10  Tandem stance on Airex with static then head turns  Narrow stance  EC 10 sec x 2, EO head turns and nods  SLS with hip semi circles Slant board 30 sec x 3  SLS with cone taps, knee hgt. Lt LE  Self Care: Review HEP for single limb stance   Modalities: Cold pack L knee 8 min   [facility] Adult PT Treatment:                                                 DATE: 02/23/21 Therapeutic Exercise: Recumbent bike L 2 for 6 min  Wall squats with toes pressing x 10 then heels pressing  Wall sit hold for 15 sec x 3 partial range Seated ball hold with Lt LAQ , 5 sec hold 4 lbs  SLR hip flexion x 10 SLR hip abduction and  extension x 10 each Sit to stand x 10 with 10 lbs  Single leg stance on foam pad L knee multiple trials  Added hip abduction with light hand assist at countertop standing on foam   Modalities: Cold pack L knee 8 min    [facility] Adult PT Treatment:                                                DATE: 02/16/21 Therapeutic Exercise: Recumbent bike L 2 for 6 min  Pilates Reformer used for LE/core strength, postural strength, lumbopelvic disassociation and core control.  Exercises included: Footwork 2 red 1 blue parallel heels, toes in narrow and parallel  Double and single leg press out, min cues for knee control and avoiding hyperextension Knee extension x 10, SLR x 10 with hip ER for VMO  Bridging 2 red 1 blue  Green band x 10 then added clam x 10 small ROM  Feet in Straps 1 red 1 yellow  Arcs, Squats  and circles , min cues for pelvic control, quad fatigue      TODAY'S TREATMENT: 02/09/2021: Recumbent bike x 5 min - focus on knee flexion motion Supine quad set with towel roll under knee 2 x 10 with 5 sec hold Piriformis stretch 2 x 30 sec SLR 2 x 10 Standing TKE with green 2 x 10 with 3 sec hold Step-up 4" box 2 x 10 - focus on slow and controlled lowering, allowing knee to bend and knee to translate forward to encourage quad control Mini-squat at counter 2 x 10  PATIENT EDUCATION:  Education details: single leg stance, HEP  Person educated: Patient Education method: Explanation, Demonstration, Tactile cues, Verbal cues, and Handouts Education comprehension: verbalized understanding, returned demonstration, verbal cues required, tactile cues required, and needs further education   HOME EXERCISE  PROGRAM: Access Code: [email or web address]  Access Code: [email or web address] URL: [email or web address] Date: 02/28/2021 Prepared by: Raeford Razor  Exercises Supine Quad Set - 5 x daily - 7 x weekly - 10 reps - 5 hold Long Sitting Quad Set with Towel Roll Under Heel - 2 x daily - 7 x weekly - 10 reps - 5 hold Active Straight Leg Raise with Quad Set - 2 x daily - 7 x weekly - 2 sets - 10 reps Supine Heel Slide with Strap - 2 x daily - 7 x weekly - 10 reps - 10 seconds hold Supine Hamstring Stretch with Strap - 2 x daily - 7 x weekly - 3 reps - 30 seconds hold Long Sitting Calf Stretch with Strap - 2 x daily - 7 x weekly - 3 reps - 30 seconds hold Sidelying Hip Abduction - 2 x daily - 7 x weekly - 2 sets - 10 reps Long Sitting Knee Extension with Towel Foot Lift - 2 x daily - 7 x weekly - 10 reps - 5 hold Standing Terminal Knee Extension with Resistance - 1-2 x daily - 7 x weekly - 2 sets - 10 reps - 3 seconds hold Mini Squat with Counter Support - 1-2 x daily - 7 x weekly - 2 sets - 10 reps Single Leg Stance - 1 x daily - 7 x weekly - 1 sets - 5 reps - 30 hold Single Leg Balance with Clock Reach - 1 x daily - 7 x weekly - 1 sets - 2-3 reps -  30-60 hold Single Leg Balance with Forward Lean - 1 x daily - 7 x weekly - 2 sets - 10 reps      ASSESSMENT: CLINICAL IMPRESSION: Patient needs cues in standing to avoid hyperextension and use surrounding muscles to support knee.  She did well with challenging balance and stability exercises using intermittent UE assist. She continues to benefit from PT and is progressing towards goals.     GOALS: Goals reviewed with patient? Yes   SHORT TERM GOALS:   STG Name Target Date Goal status  1 Patient will be I with initial HEP in order to progress with therapy. Baseline: progressing with HEP 02/23/2021 ACHEIVED  2 PT will review FOTO with patient by 3rd visit in order to understand expected progress and outcome with therapy. Baseline: reviewed 02/23/2021  ACHIEVED  3 Patient will achieve 110 deg active left knee flexion to improve gait and transfers Baseline: 125 deg on 02/02/21 02/23/2021 ACHIEVED  4 Patient will be able to ambulate community level distances without AD to improve mobility Baseline: patient weaning from single axillary crutch 02/23/2021 ACHIEVED     LONG TERM GOALS:    LTG Name Target Date Goal status  1 Patient will be I with final HEP to maintain progress from PT. Baseline: provided at eval 03/23/2021 INITIAL  2 Patient will report >/= 65% status on FOTO to indicate improved functional ability. Baseline: 47% 03/23/2021 INITIAL  3 Patient will achieve >/= 130 deg active left knee flexion to return to exercise with limitation Baseline: 03/23/2021 INITIAL  4 Patient will demonstrate 5/5 left knee strength to normalize stair negotiation Baseline: moderate quad activation 03/23/2021 INITIAL  5 Patient will report </= 1/10 pain with exercise to return to active lifestyle Baseline: 4/10 03/23/2021 INITIAL     PLAN: PT FREQUENCY: 2x/week   PT DURATION: 8 weeks   PLANNED INTERVENTIONS: Therapeutic exercises, Therapeutic activity, Neuro Muscular re-education, Balance training, Gait training, Patient/Family education, Joint mobilization, Stair training, Aquatic Therapy, Dry Needling, Electrical stimulation, Cryotherapy, Moist heat, Taping, Vasopneumatic device, and Manual therapy   PLAN FOR NEXT SESSION: Review HEP and progress balance and stability PRN, Saralyn Pilar, PT 02/28/21 5:40 PM Phone: [phone] Fax: [phone]     Raeford Razor, PT 02/28/21 5:40 PM Phone: [phone] Fax: [phone]

## 2021-03-02 ENCOUNTER — Other Ambulatory Visit: Payer: Self-pay

## 2021-03-02 ENCOUNTER — Encounter: Payer: Self-pay | Admitting: Physical Therapy

## 2021-03-02 ENCOUNTER — Ambulatory Visit: Payer: BC Managed Care – PPO | Admitting: Physical Therapy

## 2021-03-02 DIAGNOSIS — M25562 Pain in left knee: Secondary | ICD-10-CM | POA: Diagnosis not present

## 2021-03-02 DIAGNOSIS — M6281 Muscle weakness (generalized): Secondary | ICD-10-CM

## 2021-03-02 DIAGNOSIS — G8929 Other chronic pain: Secondary | ICD-10-CM

## 2021-03-02 NOTE — Therapy (Signed)
OUTPATIENT PHYSICAL THERAPY TREATMENT NOTE   Patient Name: Jamie Mccarthy MRN: [id number] DOB:14-Apr-1975, 46 y.o., female 89 Date: 03/02/2021  PCP: Hali Marry, MD REFERRING PROVIDER: Earlie Server, MD   PT End of Session - 03/02/21 0919     Visit Number 8    Number of Visits 16    Date for PT Re-Evaluation 03/23/21    Authorization Type [employer] / [employer]    PT Start Time 0915    PT Stop Time 1000    PT Time Calculation (min) 45 min    Activity Tolerance Patient tolerated treatment well    Behavior During Therapy [facility] for tasks assessed/performed                   Past Medical History:  Diagnosis Date   Anemia    Has sickle cell trait   Anxiety    Depression    GERD (gastroesophageal reflux disease)    diet controlled   Gestational diabetes    H/O vaginal delivery 2010   Headache(784.0)    Migraines   Herpes 2007   hx of   Iron deficiency anemia, unspecified 10/20/2012   Neuromuscular disorder ([place])    Obese    PONV (postoperative nausea and vomiting)    has had PONV with previous c/s and also lap band surgery 2004   Raynaud's disease    Seasonal allergies    Past Surgical History:  Procedure Laterality Date   BILATERAL SALPINGECTOMY  07/16/2012   Procedure: BILATERAL DISTAL SALPINGECTOMY;  Surgeon: Logan Bores, MD;  Location: [place] [facility];  Service: Obstetrics;;   CESAREAN SECTION  2012   CESAREAN SECTION N/A 07/16/2012   Procedure: REPEAT CESAREAN SECTION;  Surgeon: Logan Bores, MD;  Location: [place] [facility];  Service: Obstetrics;  Laterality: N/A;   CHOLECYSTECTOMY N/A 11/14/2012   Procedure: LAPAROSCOPIC CHOLECYSTECTOMY;  Surgeon: Gayland Curry, MD;  Location: [facility];  Service: General;  Laterality: N/A;   GASTRIC BANDING PORT REVISION N/A 11/14/2012   Procedure: GASTRIC BANDING PORT REVISION;  Surgeon: Gayland Curry, MD;  Location: [facility];  Service: General;  Laterality: N/A;  PORT PLACEMENT MOVED NEW MESH INSERTED    GASTRIC  ROUX-EN-Y N/A 12/21/2019   Procedure: LAPAROSCOPIC ROUX-EN-Y GASTRIC BYPASS;  Surgeon: [name], MD;  Location: [facility];  Service: General;  Laterality: N/A;   LAPAROSCOPIC GASTRIC BANDING  2005   LAPAROSCOPIC VAGINAL HYSTERECTOMY WITH SALPINGECTOMY Bilateral 10/12/2015   Procedure: HYSTERECTOMY TOTAL LAPAROSCOPIC BILATERAL SALPINGECTOMY;  Surgeon: Sherlyn Hay, DO;  Location: [place] [facility];  Service: Gynecology;  Laterality: Bilateral;   reposition gastric band  2006   x2   TUBAL LIGATION  2014   UPPER GI ENDOSCOPY N/A 12/21/2019   Procedure: UPPER GI ENDOSCOPY;  Surgeon: [name], MD;  Location: [facility];  Service: General;  Laterality: N/A;   [place]   Patient Active Problem List   Diagnosis Date Noted   Low libido 06/17/2020   Sleep disturbance 06/17/2020   Controlled type 2 diabetes mellitus with complication, without long-term current use of insulin ([place]) 03/08/2020   S/P total gastrectomy and Roux-en-Y esophagojejunal anastomosis 12/29/2019   Severe obesity (BMI >= 40) ([place]) 12/21/2019   S/P gastric bypass 12/21/2019   Paresthesias 10/08/2019   Migraine headache without aura 09/22/2019   History of diabetes mellitus, type II 07/14/2019   Acute meniscal tear of left knee 01/19/2019   History of gestational diabetes 01/01/2019   Abnormal uterine bleeding  01/01/2019   Genital herpes simplex 11/14/2018   Seborrheic dermatitis of scalp 08/07/2018   Anxiety and depression 05/27/2018   Orthostasis 12/06/2017   Trochanteric bursitis, right hip 03/06/2016   Menorrhagia 10/12/2015   Fibroid 10/12/2015   S/P laparoscopic hysterectomy 10/12/2015   Lumbosacral strain 08/18/2014   Iron deficiency anemia, unspecified 10/20/2012   H/O laparoscopic adjustable gastric banding 02/2003 10/02/2012   Regurgitation 10/02/2012   S/P repeat low transverse C-section 07/17/2012   Sickle cell trait ([place]) 12/10/2011   Allergic rhinitis 06/14/2010   GERD 12/27/2008    Catamenial migraine 10/30/2005   RHINITIS, ALLERGIC 10/30/2005    REFERRING PROVIDER: Earlie Server, MD REFERRING DIAG: Post-op Left Knee Scope Medial Menisectomy Possible Micro Fracture ONSET DATE: 01/12/2021  THERAPY DIAG:  Chronic pain of left knee  Muscle weakness (generalized)  PERTINENT HISTORY: N/A  PRECAUTIONS: None WEIGHT BEARING RESTRICTIONS: WBAT  SUBJECTIVE: patient reports she is a little tight this morning, she feels like her knee is still weak and will get tight with exercise  PAIN:  Are you having pain? no VAS scale: 0/10 knee stiffness  Pain location: Knee Pain orientation: Left  PAIN TYPE: Stiffness, sore Pain description: intermittent  PATIENT GOALS: Get back to working out (pilates and pure barre)   OBJECTIVE: (BOLDED MEASURES ASSESSED THIS VISIT) PATIENT SURVEYS:  FOTO 47% functional status   LE AROM/PROM:   A/PROM Right 01/26/2021 Left 01/26/2021 Left 01/31/2021 Left 02/02/2021  Knee flexion 135 83 (94 PROM) 120 (124 PROM, 133 following MET) 125  Knee extension 5 hyper 5 hyper      LE MMT:   MMT Right 01/26/2021 Left 01/26/2021 Left 02/09/2021  Hip flexion 4 4-   Hip extension 4 4-   Hip abduction 4 4-   Knee flexion 5 4-   Knee extension 5 Mod. quad activation 4-; patient able to perform SLR without quad lag      TODAY'S TREATMENT:  03/02/2021:  Therapeutic exercise: Recumbent bike L2 x 5 min while taking subjective Quad set x 10, with towel placed under heel to emphasize active hyperextension x 10 Prone TKE with foot on bolster x 10 Longsitting quad set focused on active hyperextension x 10 Standing TKE with green x 10 Forward heel tap on 2" box 2 x 10 - contralateral UE support for balance SL leg press (omega) 15# 3 x 10 Hip abduction and extension 2 x 15 - left only with right stance on foam pad Blood flow restriction Position and location of cuff: Seated and proximal thigh Limb occlusion pressure (mmHg): 260 Exercise pressure  (mmHg): 208 (80%) Exercise prescription: 16,07,37,10, reps with 30-60 sec rest Exercise comment:  LAQ with 1#   02/28/2021: Therapeutic Exercise: Recumbent bike 6 min L 2  Standing knee mobility, knee flexion and then extension x 10 sec x 6  Squat on Airex pad x 10, added knee lift  x 10 each LE , light hand hold needed  Slow march with 5 sec hold x 10  Tandem stance on Airex with static then head turns  Narrow stance  EC 10 sec x 2, EO head turns and nods  SLS with hip semi circles Slant board 30 sec x 3  SLS with cone taps, knee hgt. Lt LE Self Care: Review HEP for single limb stance   Modalities: Cold pack L knee 8 min   02/23/2021: Therapeutic Exercise: Recumbent bike L 2 for 6 min  Wall squats with toes pressing x 10 then heels pressing  Wall sit  hold for 15 sec x 3 partial range Seated ball hold with Lt LAQ , 5 sec hold 4 lbs  SLR hip flexion x 10 SLR hip abduction and extension x 10 each Sit to stand x 10 with 10 lbs  Single leg stance on foam pad L knee multiple trials  Added hip abduction with light hand assist at countertop standing on foam  Modalities: Cold pack L knee 8 min   02/16/2021: Therapeutic Exercise: Recumbent bike L 2 for 6 min  Pilates Reformer used for LE/core strength, postural strength, lumbopelvic disassociation and core control.  Exercises included: Footwork 2 red 1 blue parallel heels, toes in narrow and parallel  Double and single leg press out, min cues for knee control and avoiding hyperextension Knee extension x 10, SLR x 10 with hip ER for VMO  Bridging 2 red 1 blue  Green band x 10 then added clam x 10 small ROM  Feet in Straps 1 red 1 yellow  Arcs, Squats  and circles , min cues for pelvic control, quad fatigue   PATIENT EDUCATION:  Education details: HEP, patient instructed she could return to pilates exercise classes as long as she starts light and gradually progresses Person educated: Patient Education method: Explanation,  Demonstration, Tactile cues, Verbal cues Education comprehension: verbalized understanding, returned demonstration, verbal cues required, tactile cues required, and needs further education   HOME EXERCISE PROGRAM: Access Code: [email or web address]    ASSESSMENT: CLINICAL IMPRESSION: Patient tolerated well with no adverse effects. Therapy focused on continue quad control training and strengthening exercises. She exhibits good passive motion but has trouble with controlling active hyperextension of the knee which causes her difficulty with standing tasks. She did demonstrate improvement in session and encouraged to work on this at home. Progressed strengthening exercises with good tolerance, but she did demonstrate fatigue of quad. Finished therapy with using BFR to reduce load and maintain strength stimulus. Patient would benefit from continued skilled PT to progress her strength and dynamic control in order to maximize functional ability and return to prior level of exercise.     GOALS: Goals reviewed with patient? Yes   SHORT TERM GOALS:   STG Name Target Date Goal status  1 Patient will be I with initial HEP in order to progress with therapy. Baseline: progressing with HEP 02/23/2021 ACHEIVED  2 PT will review FOTO with patient by 3rd visit in order to understand expected progress and outcome with therapy. Baseline: reviewed 02/23/2021 ACHIEVED  3 Patient will achieve 110 deg active left knee flexion to improve gait and transfers Baseline: 125 deg on 02/02/21 02/23/2021 ACHIEVED  4 Patient will be able to ambulate community level distances without AD to improve mobility Baseline: patient weaning from single axillary crutch 02/23/2021 ACHIEVED     LONG TERM GOALS:    LTG Name Target Date Goal status  1 Patient will be I with final HEP to maintain progress from PT. Baseline: progressing 03/23/2021 ONGOING  2 Patient will report >/= 65% status on FOTO to indicate improved functional ability. Baseline: 65%  03/23/2021 ACHIEVED  3 Patient will achieve >/= 130 deg active left knee flexion to return to exercise with limitation Baseline: 125 deg at last assessment  03/23/2021 ONGOING  4 Patient will demonstrate 5/5 left knee strength to normalize stair negotiation Baseline: 4+/5 MMT 03/23/2021 ONGOING  5 Patient will report </= 1/10 pain with exercise to return to active lifestyle Baseline: patient reports tightness and soreness with extended activity 03/23/2021 ONGOING  PLAN: PT FREQUENCY: 2x/week   PT DURATION: 8 weeks   PLANNED INTERVENTIONS: Therapeutic exercises, Therapeutic activity, Neuro Muscular re-education, Balance training, Gait training, Patient/Family education, Joint mobilization, Stair training, Aquatic Therapy, Dry Needling, Electrical stimulation, Cryotherapy, Moist heat, Taping, Vasopneumatic device, and Manual therapy   PLAN FOR NEXT SESSION: Review HEP and progress balance and stability PRN, progress strength   Hilda Blades, PT, DPT, LAT, ATC 03/02/21  10:57 AM Phone: [phone] Fax: [phone]

## 2021-03-07 ENCOUNTER — Encounter: Payer: Self-pay | Admitting: Physical Therapy

## 2021-03-07 ENCOUNTER — Other Ambulatory Visit: Payer: Self-pay

## 2021-03-07 ENCOUNTER — Ambulatory Visit: Payer: BC Managed Care – PPO | Admitting: Physical Therapy

## 2021-03-07 DIAGNOSIS — M6281 Muscle weakness (generalized): Secondary | ICD-10-CM | POA: Diagnosis not present

## 2021-03-07 DIAGNOSIS — G8929 Other chronic pain: Secondary | ICD-10-CM | POA: Diagnosis not present

## 2021-03-07 DIAGNOSIS — M25562 Pain in left knee: Secondary | ICD-10-CM

## 2021-03-07 NOTE — Therapy (Signed)
OUTPATIENT PHYSICAL THERAPY TREATMENT NOTE   Patient Name: Jamie Mccarthy MRN: [id number] DOB:Jul 16, 1975, 46 y.o., female Today's Date: 03/07/2021  PCP: Hali Marry, MD REFERRING PROVIDER: Hali Marry, *   PT End of Session - 03/07/21 0934     Visit Number 9    Number of Visits 16    PT Start Time 0931    PT Stop Time 1015    PT Time Calculation (min) 44 min    Activity Tolerance Patient tolerated treatment well    Behavior During Therapy [facility] for tasks assessed/performed                    Past Medical History:  Diagnosis Date   Anemia    Has sickle cell trait   Anxiety    Depression    GERD (gastroesophageal reflux disease)    diet controlled   Gestational diabetes    H/O vaginal delivery 2010   Headache(784.0)    Migraines   Herpes 2007   hx of   Iron deficiency anemia, unspecified 10/20/2012   Neuromuscular disorder ([place])    Obese    PONV (postoperative nausea and vomiting)    has had PONV with previous c/s and also lap band surgery 2004   Raynaud's disease    Seasonal allergies    Past Surgical History:  Procedure Laterality Date   BILATERAL SALPINGECTOMY  07/16/2012   Procedure: BILATERAL DISTAL SALPINGECTOMY;  Surgeon: Logan Bores, MD;  Location: [place] [facility];  Service: Obstetrics;;   CESAREAN SECTION  2012   CESAREAN SECTION N/A 07/16/2012   Procedure: REPEAT CESAREAN SECTION;  Surgeon: Logan Bores, MD;  Location: [place] [facility];  Service: Obstetrics;  Laterality: N/A;   CHOLECYSTECTOMY N/A 11/14/2012   Procedure: LAPAROSCOPIC CHOLECYSTECTOMY;  Surgeon: Gayland Curry, MD;  Location[facility];  Service: General;  Laterality: N/A;   GASTRIC BANDING PORT REVISION N/A 11/14/2012   Procedure: GASTRIC BANDING PORT REVISION;  Surgeon: Gayland Curry, MD;  [facility];  Service: General;  Laterality: N/A;  PORT PLACEMENT MOVED NEW MESH INSERTED    GASTRIC ROUX-EN-Y N/A 12/21/2019   Procedure: LAPAROSCOPIC ROUX-EN-Y GASTRIC BYPASS;   Surgeon: [name], MD;  Location[facility];  Service: General;  Laterality: N/A;   LAPAROSCOPIC GASTRIC BANDING  2005   LAPAROSCOPIC VAGINAL HYSTERECTOMY WITH SALPINGECTOMY Bilateral 10/12/2015   Procedure: HYSTERECTOMY TOTAL LAPAROSCOPIC BILATERAL SALPINGECTOMY;  Surgeon: Sherlyn Hay, DO;  Location: [place] [facility];  Service: Gynecology;  Laterality: Bilateral;   reposition gastric band  2006   x2   TUBAL LIGATION  2014   UPPER GI ENDOSCOPY N/A 12/21/2019   Procedure: UPPER GI ENDOSCOPY;  Surgeon: [name], MD;  Location: [facility];  Service: General;  Laterality: N/A;   [place]   Patient Active Problem List   Diagnosis Date Noted   Low libido 06/17/2020   Sleep disturbance 06/17/2020   Controlled type 2 diabetes mellitus with complication, without long-term current use of insulin ([place]) 03/08/2020   S/P total gastrectomy and Roux-en-Y esophagojejunal anastomosis 12/29/2019   Severe obesity (BMI >= 40) ([place]) 12/21/2019   S/P gastric bypass 12/21/2019   Paresthesias 10/08/2019   Migraine headache without aura 09/22/2019   History of diabetes mellitus, type II 07/14/2019   Acute meniscal tear of left knee 01/19/2019   History of gestational diabetes 01/01/2019   Abnormal uterine bleeding 01/01/2019   Genital herpes simplex 11/14/2018   Seborrheic dermatitis of scalp 08/07/2018  Anxiety and depression 05/27/2018   Orthostasis 12/06/2017   Trochanteric bursitis, right hip 03/06/2016   Menorrhagia 10/12/2015   Fibroid 10/12/2015   S/P laparoscopic hysterectomy 10/12/2015   Lumbosacral strain 08/18/2014   Iron deficiency anemia, unspecified 10/20/2012   H/O laparoscopic adjustable gastric banding 02/2003 10/02/2012   Regurgitation 10/02/2012   S/P repeat low transverse C-section 07/17/2012   Sickle cell trait ([place]) 12/10/2011   Allergic rhinitis 06/14/2010   GERD 12/27/2008   Catamenial migraine 10/30/2005   RHINITIS, ALLERGIC 10/30/2005    REFERRING  PROVIDER: Earlie Server, MD REFERRING DIAG: Post-op Left Knee Scope Medial Menisectomy Possible Micro Fracture ONSET DATE: 01/12/2021  THERAPY DIAG:  Chronic pain of left knee  Muscle weakness (generalized)  PERTINENT HISTORY: N/A  PRECAUTIONS: None WEIGHT BEARING RESTRICTIONS: WBAT  SUBJECTIVE:   Pt limping more today.  The stiffness is "lingering" more now, like it loosens up and little but it gets tight again.  She is working more (7-8 hours, 5-6 days per), and maybe its that.  No pain though.   PAIN:  Are you having pain? no VAS scale: 0/10 knee stiffness  Pain location: Knee Pain orientation: Left  PAIN TYPE: Stiffness, sore Pain description: intermittent  PATIENT GOALS: Get back to working out (pilates and pure barre)   OBJECTIVE: (BOLDED MEASURES ASSESSED THIS VISIT) PATIENT SURVEYS:  FOTO 47% functional status   LE AROM/PROM:   A/PROM Right 01/26/2021 Left 01/26/2021 Left 01/31/2021 Left 02/02/2021  Knee flexion 135 83 (94 PROM) 120 (124 PROM, 133 following MET) 125  Knee extension 5 hyper 5 hyper      LE MMT:   MMT Right 01/26/2021 Left 01/26/2021 Left 02/09/2021  Hip flexion 4 4-   Hip extension 4 4-   Hip abduction 4 4-   Knee flexion 5 4-   Knee extension 5 Mod. quad activation 4-; patient able to perform SLR without quad lag       [facility] Adult PT Treatment:                                                DATE: 03/07/21 Therapeutic Exercise: Recumbent bike, L2 for 5 min  Quad stretch standing x 3, 30 sec  Hamstring and ITB x 2 each , 30 sec  Supine knee extension mid range loading with red  band on opposite foot x 20    Pilates Reformer used for LE/core strength, postural strength, lumbopelvic disassociation and core control.  Exercises included: Footwork 2 red 1 blue 1 Yellow   Parallel on heels , turnout on heels  Toes in parallel , added heel stretch   Feet in Straps 1 Red 1 Yellow Circles, each direction x 8  Scooter 1 red     TODAY'S  TREATMENT:  03/02/2021:  Therapeutic exercise: Recumbent bike L2 x 5 min while taking subjective Quad set x 10, with towel placed under heel to emphasize active hyperextension x 10 Prone TKE with foot on bolster x 10 Longsitting quad set focused on active hyperextension x 10 Standing TKE with green x 10 Forward heel tap on 2" box 2 x 10 - contralateral UE support for balance SL leg press (omega) 15# 3 x 10 Hip abduction and extension 2 x 15 - left only with right stance on foam pad Blood flow restriction Position and location of cuff: Seated and proximal thigh Limb  occlusion pressure (mmHg): 260 Exercise pressure (mmHg): 208 (80%) Exercise prescription: 60,63,01,60, reps with 30-60 sec rest Exercise comment:  LAQ with 1#   02/28/2021: Therapeutic Exercise: Recumbent bike 6 min L 2  Standing knee mobility, knee flexion and then extension x 10 sec x 6  Squat on Airex pad x 10, added knee lift  x 10 each LE , light hand hold needed  Slow march with 5 sec hold x 10  Tandem stance on Airex with static then head turns  Narrow stance  EC 10 sec x 2, EO head turns and nods  SLS with hip semi circles Slant board 30 sec x 3  SLS with cone taps, knee hgt. Lt LE Self Care: Review HEP for single limb stance   Modalities: Cold pack L knee 8 min   PATIENT EDUCATION:  Education details: HEP, patient instructed she could return to pilates exercise classes as long as she starts light and gradually progresses Person educated: Patient Education method: Explanation, Demonstration, Tactile cues, Verbal cues Education comprehension: verbalized understanding, returned demonstration, verbal cues required, tactile cues required, and needs further education   HOME EXERCISE PROGRAM: Access Code: [email or web address]    ASSESSMENT: CLINICAL IMPRESSION:  After warm up she was able to perform Pilates Reformer exercises with no pain .  She has some difficulty standing and maintaining level pelvis due to glute med  weakness.  She had not paid attention to this prior when in Pilates class.  She is going to try a level 1 class soon.  She is noticing more stiffness recently but she attributes it to more work days and hours.  Patient would benefit from continued skilled PT to progress her strength and dynamic control in order to maximize functional ability and return to prior level of exercise.     GOALS: Goals reviewed with patient? Yes   SHORT TERM GOALS:   STG Name Target Date Goal status  1 Patient will be I with initial HEP in order to progress with therapy. Baseline: progressing with HEP 02/23/2021 ACHEIVED  2 PT will review FOTO with patient by 3rd visit in order to understand expected progress and outcome with therapy. Baseline: reviewed 02/23/2021 ACHIEVED  3 Patient will achieve 110 deg active left knee flexion to improve gait and transfers Baseline: 125 deg on 02/02/21 02/23/2021 ACHIEVED  4 Patient will be able to ambulate community level distances without AD to improve mobility Baseline: patient weaning from single axillary crutch 02/23/2021 ACHIEVED     LONG TERM GOALS:    LTG Name Target Date Goal status  1 Patient will be I with final HEP to maintain progress from PT. Baseline: progressing 03/23/2021 ONGOING  2 Patient will report >/= 65% status on FOTO to indicate improved functional ability. Baseline: 65% 03/23/2021 ACHIEVED  3 Patient will achieve >/= 130 deg active left knee flexion to return to exercise with limitation Baseline: 125 deg at last assessment  03/23/2021 ONGOING  4 Patient will demonstrate 5/5 left knee strength to normalize stair negotiation Baseline: 4+/5 MMT 03/23/2021 ONGOING  5 Patient will report </= 1/10 pain with exercise to return to active lifestyle Baseline: patient reports tightness and soreness with extended activity 03/23/2021 ONGOING     PLAN: PT FREQUENCY: 2x/week   PT DURATION: 8 weeks   PLANNED INTERVENTIONS: Therapeutic exercises, Therapeutic activity, Neuro  Muscular re-education, Balance training, Gait training, Patient/Family education, Joint mobilization, Stair training, Aquatic Therapy, Dry Needling, Electrical stimulation, Cryotherapy, Moist heat, Taping, Vasopneumatic device, and Manual therapy  PLAN FOR NEXT SESSION:  Cont standing strength, knee control and stability .    Raeford Razor, PT 03/07/21 10:24 AM Phone: [phone] Fax: [phone]

## 2021-03-08 NOTE — Therapy (Signed)
OUTPATIENT PHYSICAL THERAPY TREATMENT NOTE   Patient Name: Jamie Mccarthy MRN: [id number] DOB:01/11/1976, 46 y.o., female 55 Date: 03/09/2021  PCP: Hali Marry, MD REFERRING PROVIDER: Earlie Server, MD   PT End of Session - 03/09/21 [phone]     Visit Number 10    Number of Visits 16    Date for PT Re-Evaluation 03/23/21    Authorization Type [employer] / [employer]    PT Start Time 0919    PT Stop Time 1000    PT Time Calculation (min) 41 min    Activity Tolerance Patient tolerated treatment well    Behavior During Therapy [facility] for tasks assessed/performed                     Past Medical History:  Diagnosis Date   Anemia    Has sickle cell trait   Anxiety    Depression    GERD (gastroesophageal reflux disease)    diet controlled   Gestational diabetes    H/O vaginal delivery 2010   Headache(784.0)    Migraines   Herpes 2007   hx of   Iron deficiency anemia, unspecified 10/20/2012   Neuromuscular disorder ([place])    Obese    PONV (postoperative nausea and vomiting)    has had PONV with previous c/s and also lap band surgery 2004   Raynaud's disease    Seasonal allergies    Past Surgical History:  Procedure Laterality Date   BILATERAL SALPINGECTOMY  07/16/2012   Procedure: BILATERAL DISTAL SALPINGECTOMY;  Surgeon: Logan Bores, MD;  Location: [facility];  Service: Obstetrics;;   CESAREAN SECTION  2012   CESAREAN SECTION N/A 07/16/2012   Procedure: REPEAT CESAREAN SECTION;  Surgeon: Logan Bores, MD;  Location: [facility];  Service: Obstetrics;  Laterality: N/A;   CHOLECYSTECTOMY N/A 11/14/2012   Procedure: LAPAROSCOPIC CHOLECYSTECTOMY;  Surgeon: Gayland Curry, MD;  Location: [facility];  Service: General;  Laterality: N/A;   GASTRIC BANDING PORT REVISION N/A 11/14/2012   Procedure: GASTRIC BANDING PORT REVISION;  Surgeon: Gayland Curry, MD;  Location: [facility];  Service: General;  Laterality: N/A;  PORT PLACEMENT MOVED NEW MESH INSERTED     GASTRIC ROUX-EN-Y N/A 12/21/2019   Procedure: LAPAROSCOPIC ROUX-EN-Y GASTRIC BYPASS;  Surgeon: [name], MD;  Location: [facility];  Service: General;  Laterality: N/A;   LAPAROSCOPIC GASTRIC BANDING  2005   LAPAROSCOPIC VAGINAL HYSTERECTOMY WITH SALPINGECTOMY Bilateral 10/12/2015   Procedure: HYSTERECTOMY TOTAL LAPAROSCOPIC BILATERAL SALPINGECTOMY;  Surgeon: Sherlyn Hay, DO;  Location: [facility];  Service: Gynecology;  Laterality: Bilateral;   reposition gastric band  2006   x2   TUBAL LIGATION  2014   UPPER GI ENDOSCOPY N/A 12/21/2019   Procedure: UPPER GI ENDOSCOPY;  Surgeon: [name], MD;  Location: [facility];  Service: General;  Laterality: N/A;   [place]   Patient Active Problem List   Diagnosis Date Noted   Low libido 06/17/2020   Sleep disturbance 06/17/2020   Controlled type 2 diabetes mellitus with complication, without long-term current use of insulin ([place]) 03/08/2020   S/P total gastrectomy and Roux-en-Y esophagojejunal anastomosis 12/29/2019   Severe obesity (BMI >= 40) ([place]) 12/21/2019   S/P gastric bypass 12/21/2019   Paresthesias 10/08/2019   Migraine headache without aura 09/22/2019   History of diabetes mellitus, type II 07/14/2019   Acute meniscal tear of left knee 01/19/2019   History of gestational diabetes 01/01/2019   Abnormal  uterine bleeding 01/01/2019   Genital herpes simplex 11/14/2018   Seborrheic dermatitis of scalp 08/07/2018   Anxiety and depression 05/27/2018   Orthostasis 12/06/2017   Trochanteric bursitis, right hip 03/06/2016   Menorrhagia 10/12/2015   Fibroid 10/12/2015   S/P laparoscopic hysterectomy 10/12/2015   Lumbosacral strain 08/18/2014   Iron deficiency anemia, unspecified 10/20/2012   H/O laparoscopic adjustable gastric banding 02/2003 10/02/2012   Regurgitation 10/02/2012   S/P repeat low transverse C-section 07/17/2012   Sickle cell trait ([place]) 12/10/2011   Allergic rhinitis 06/14/2010   GERD 12/27/2008    Catamenial migraine 10/30/2005   RHINITIS, ALLERGIC 10/30/2005    REFERRING PROVIDER: Earlie Server, MD REFERRING DIAG: Post-op Left Knee Scope Medial Menisectomy Possible Micro Fracture ONSET DATE: 01/12/2021  THERAPY DIAG:  Chronic pain of left knee  Muscle weakness (generalized)  PERTINENT HISTORY: N/A  PRECAUTIONS: None WEIGHT BEARING RESTRICTIONS: WBAT  SUBJECTIVE: Patient reports knee stiffness, states it is always stiff.   PAIN:  Are you having pain? no VAS scale: 3/10 stiffness  Pain location: Knee Pain orientation: Left  PAIN TYPE: Surgical, chronic Pain description: intermittent  PATIENT GOALS: Get back to working out (pilates and pure barre)   OBJECTIVE: (BOLDED MEASURES ASSESSED THIS VISIT) PATIENT SURVEYS:  FOTO 47% functional status   LE AROM/PROM:   A/PROM Right 01/26/2021 Left 01/26/2021 Left 01/31/2021 Left 02/02/2021  Knee flexion 135 83 (94 PROM) 120 (124 PROM, 133 following MET) 125  Knee extension 5 hyper 5 hyper      LE MMT:   MMT Right 01/26/2021 Left 01/26/2021 Left 02/09/2021 Left 03/09/2021  Hip flexion 4 4-    Hip extension 4 4-    Hip abduction 4 4-    Knee flexion 5 4-    Knee extension 5 Mod. quad activation 4-; patient able to perform SLR without quad lag 4     SLS: patient able to maintain left SLS 30+ seconds    TODAY'S TREATMENT:  03/09/2021:  Therapeutic Exercise: Recumbent bike L2 x 5 min while taking subjective Longsitting quad set 5 x 5 sec - focus on popping heel off table  Seated quad set kicking heel into physioball and resistance at knee with red band 2 x 10 with 5 sec hold LAQ with 2# 2 x 10 with 2 sec hold Forward heel tap on 4" box with TRX support 2 x 10 (left only) Sidelying hip abduction with 2# at ankle 2 x 15 each Split squat at counter with BOSU knee tap 2 x 15 (left only) SLS on Airex 2 x 30 sec each   03/07/21 Therapeutic Exercise: Recumbent bike, L2 for 5 min  Quad stretch standing x 3, 30 sec   Hamstring and ITB x 2 each , 30 sec  Supine knee extension mid range loading with red  band on opposite foot x 20  Pilates Reformer used for LE/core strength, postural strength, lumbopelvic disassociation and core control.  Exercises included: Footwork 2 red 1 blue 1 Yellow   Parallel on heels , turnout on heels  Toes in parallel , added heel stretch  Feet in Straps 1 Red 1 Yellow Circles, each direction x 8 Scooter 1 red   03/02/2021:  Therapeutic exercise: Recumbent bike L2 x 5 min while taking subjective Quad set x 10, with towel placed under heel to emphasize active hyperextension x 10 Prone TKE with foot on bolster x 10 Longsitting quad set focused on active hyperextension x 10 Standing TKE with green x 10 Forward heel  tap on 2" box 2 x 10 - contralateral UE support for balance SL leg press (omega) 15# 3 x 10 Hip abduction and extension 2 x 15 - left only with right stance on foam pad Blood flow restriction Position and location of cuff: Seated and proximal thigh Limb occlusion pressure (mmHg): 260 Exercise pressure (mmHg): 208 (80%) Exercise prescription: 38,10,17,51, reps with 30-60 sec rest Exercise comment:  LAQ with 1#  PATIENT EDUCATION:  Education details: HEP Person educated: Patient Education method: Explanation, Demonstration, Tactile cues, Verbal cues Education comprehension: verbalized understanding, returned demonstration, verbal cues required, tactile cues required, and needs further education   HOME EXERCISE PROGRAM: Access Code: [email or web address]    ASSESSMENT: CLINICAL IMPRESSION: Patient tolerated therapy well with no adverse effects. Therapy focused primarily on progressing quad strength and control. She does demonstrate improved quad control this visit, she was able to perform active knee hyper extension and progress to forward heel tap and 4" box with proper control. Overall, patient does continue to demonstrate gross quad weakness and endurance deficit of the  left quad likely contributing to her feeling of stiffness. Patient would benefit from continued skilled PT to progress her strength and dynamic control in order to maximize functional ability and return to prior level of exercise.   Objective impairments include Abnormal gait, decreased activity tolerance, decreased balance, difficulty walking, decreased ROM, decreased strength, impaired flexibility, and pain.   GOALS: Goals reviewed with patient? Yes   SHORT TERM GOALS:   STG Name Target Date Goal status  1 Patient will be I with initial HEP in order to progress with therapy. Baseline: progressing with HEP 02/23/2021 ACHEIVED  2 PT will review FOTO with patient by 3rd visit in order to understand expected progress and outcome with therapy. Baseline: reviewed 02/23/2021 ACHIEVED  3 Patient will achieve 110 deg active left knee flexion to improve gait and transfers Baseline: 125 deg on 02/02/21 02/23/2021 ACHIEVED  4 Patient will be able to ambulate community level distances without AD to improve mobility Baseline: patient weaning from single axillary crutch 02/23/2021 ACHIEVED     LONG TERM GOALS:    LTG Name Target Date Goal status  1 Patient will be I with final HEP to maintain progress from PT. Baseline: progressing 03/23/2021 ONGOING  2 Patient will report >/= 65% status on FOTO to indicate improved functional ability. Baseline: 65% 03/23/2021 ACHIEVED  3 Patient will achieve >/= 130 deg active left knee flexion to return to exercise with limitation Baseline: 125 deg at last assessment  03/23/2021 ONGOING  4 Patient will demonstrate 5/5 left knee strength to normalize stair negotiation Baseline: 4+/5 MMT 03/23/2021 ONGOING  5 Patient will report </= 1/10 pain with exercise to return to active lifestyle Baseline: patient reports tightness and soreness with extended activity 03/23/2021 ONGOING     PLAN: PT FREQUENCY: 2x/week   PT DURATION: 8 weeks   PLANNED INTERVENTIONS: Therapeutic  exercises, Therapeutic activity, Neuro Muscular re-education, Balance training, Gait training, Patient/Family education, Joint mobilization, Stair training, Aquatic Therapy, Dry Needling, Electrical stimulation, Cryotherapy, Moist heat, Taping, Vasopneumatic device, and Manual therapy   PLAN FOR NEXT SESSION:  Cont standing strength, knee control and stability .    Hilda Blades, PT, DPT, LAT, ATC 03/09/21  10:03 AM Phone: [phone] Fax: [phone]

## 2021-03-09 ENCOUNTER — Encounter: Payer: Self-pay | Admitting: Physical Therapy

## 2021-03-09 ENCOUNTER — Ambulatory Visit: Payer: BC Managed Care – PPO | Admitting: Physical Therapy

## 2021-03-09 ENCOUNTER — Other Ambulatory Visit: Payer: Self-pay

## 2021-03-09 DIAGNOSIS — M25562 Pain in left knee: Secondary | ICD-10-CM | POA: Diagnosis not present

## 2021-03-09 DIAGNOSIS — M6281 Muscle weakness (generalized): Secondary | ICD-10-CM

## 2021-03-09 DIAGNOSIS — G8929 Other chronic pain: Secondary | ICD-10-CM | POA: Diagnosis not present

## 2021-03-14 ENCOUNTER — Ambulatory Visit: Payer: BC Managed Care – PPO | Admitting: Physical Therapy

## 2021-03-14 NOTE — Therapy (Incomplete)
OUTPATIENT PHYSICAL THERAPY TREATMENT NOTE   Patient Name: Jamie Mccarthy MRN: [id number] DOB:[birth year], 46 y.o., female 38 Date: 03/14/2021  PCP: Hali Marry, MD REFERRING PROVIDER: Hali Marry, *             Past Medical History:  Diagnosis Date   Anemia    Has sickle cell trait   Anxiety    Depression    GERD (gastroesophageal reflux disease)    diet controlled   Gestational diabetes    H/O vaginal delivery 2010   Headache(784.0)    Migraines   Herpes 2007   hx of   Iron deficiency anemia, unspecified 10/20/2012   Neuromuscular disorder ([place])    Obese    PONV (postoperative nausea and vomiting)    has had PONV with previous c/s and also lap band surgery 2004   Raynaud's disease    Seasonal allergies    Past Surgical History:  Procedure Laterality Date   BILATERAL SALPINGECTOMY  07/16/2012   Procedure: BILATERAL DISTAL SALPINGECTOMY;  Surgeon: Logan Bores, MD;  Location: [facility];  Service: Obstetrics;;   CESAREAN SECTION  2012   CESAREAN SECTION N/A 07/16/2012   Procedure: REPEAT CESAREAN SECTION;  Surgeon: Logan Bores, MD;  Location: [facility];  Service: Obstetrics;  Laterality: N/A;   CHOLECYSTECTOMY N/A 11/14/2012   Procedure: LAPAROSCOPIC CHOLECYSTECTOMY;  Surgeon: Gayland Curry, MD;  Location: [facility];  Service: General;  Laterality: N/A;   GASTRIC BANDING PORT REVISION N/A 11/14/2012   Procedure: GASTRIC BANDING PORT REVISION;  Surgeon: Gayland Curry, MD;  Location: [facility];  Service: General;  Laterality: N/A;  PORT PLACEMENT MOVED NEW MESH INSERTED    GASTRIC ROUX-EN-Y N/A 12/21/2019   Procedure: LAPAROSCOPIC ROUX-EN-Y GASTRIC BYPASS;  Surgeon: [name], MD;  Location: [facility];  Service: General;  Laterality: N/A;   LAPAROSCOPIC GASTRIC BANDING  2005   LAPAROSCOPIC VAGINAL HYSTERECTOMY WITH SALPINGECTOMY Bilateral 10/12/2015   Procedure: HYSTERECTOMY TOTAL LAPAROSCOPIC BILATERAL SALPINGECTOMY;  Surgeon: Sherlyn Hay, DO;  Location: [facility];  Service: Gynecology;  Laterality: Bilateral;   reposition gastric band  2006   x2   TUBAL LIGATION  2014   UPPER GI ENDOSCOPY N/A 12/21/2019   Procedure: UPPER GI ENDOSCOPY;  Surgeon: [name], MD;  Location: [facility];  Service: General;  Laterality: N/A;   [place]   Patient Active Problem List   Diagnosis Date Noted   Low libido 06/17/2020   Sleep disturbance 06/17/2020   Controlled type 2 diabetes mellitus with complication, without long-term current use of insulin ([place]) 03/08/2020   S/P total gastrectomy and Roux-en-Y esophagojejunal anastomosis 12/29/2019   Severe obesity (BMI >= 40) ([place]) 12/21/2019   S/P gastric bypass 12/21/2019   Paresthesias 10/08/2019   Migraine headache without aura 09/22/2019   History of diabetes mellitus, type II 07/14/2019   Acute meniscal tear of left knee 01/19/2019   History of gestational diabetes 01/01/2019   Abnormal uterine bleeding 01/01/2019   Genital herpes simplex 11/14/2018   Seborrheic dermatitis of scalp 08/07/2018   Anxiety and depression 05/27/2018   Orthostasis 12/06/2017   Trochanteric bursitis, right hip 03/06/2016   Menorrhagia 10/12/2015   Fibroid 10/12/2015   S/P laparoscopic hysterectomy 10/12/2015   Lumbosacral strain 08/18/2014   Iron deficiency anemia, unspecified 10/20/2012   H/O laparoscopic adjustable gastric banding 02/2003 10/02/2012   Regurgitation 10/02/2012   S/P repeat low transverse C-section 07/17/2012   Sickle cell trait ([place]) 12/10/2011   Allergic  rhinitis 06/14/2010   GERD 12/27/2008   Catamenial migraine 10/30/2005   RHINITIS, ALLERGIC 10/30/2005    REFERRING PROVIDER: Earlie Server, MD REFERRING DIAG: Post-op Left Knee Scope Medial Menisectomy Possible Micro Fracture ONSET DATE: 01/12/2021  THERAPY DIAG:  No diagnosis found.  PERTINENT HISTORY: N/A  PRECAUTIONS: None WEIGHT BEARING RESTRICTIONS: WBAT  SUBJECTIVE:  *** Patient  reports knee stiffness, states it is always stiff.   PAIN:  Are you having pain? no VAS scale: 3/10 stiffness  Pain location: Knee Pain orientation: Left  PAIN TYPE: Surgical, chronic Pain description: intermittent  PATIENT GOALS: Get back to working out (pilates and pure barre)   OBJECTIVE: (BOLDED MEASURES ASSESSED THIS VISIT) PATIENT SURVEYS:  FOTO 47% functional status   LE AROM/PROM:   A/PROM Right 01/26/2021 Left 01/26/2021 Left 01/31/2021 Left 02/02/2021  Knee flexion 135 83 (94 PROM) 120 (124 PROM, 133 following MET) 125  Knee extension 5 hyper 5 hyper      LE MMT:   MMT Right 01/26/2021 Left 01/26/2021 Left 02/09/2021 Left 03/09/2021  Hip flexion 4 4-    Hip extension 4 4-    Hip abduction 4 4-    Knee flexion 5 4-    Knee extension 5 Mod. quad activation 4-; patient able to perform SLR without quad lag 4     SLS: patient able to maintain left SLS 30+ seconds    TODAY'S TREATMENT:   03/14/21:  Therapeutic Exercise:       03/09/2021:  Therapeutic Exercise: Recumbent bike L2 x 5 min while taking subjective Longsitting quad set 5 x 5 sec - focus on popping heel off table  Seated quad set kicking heel into physioball and resistance at knee with red band 2 x 10 with 5 sec hold LAQ with 2# 2 x 10 with 2 sec hold Forward heel tap on 4" box with TRX support 2 x 10 (left only) Sidelying hip abduction with 2# at ankle 2 x 15 each Split squat at counter with BOSU knee tap 2 x 15 (left only) SLS on Airex 2 x 30 sec each   03/07/21 Therapeutic Exercise: Recumbent bike, L2 for 5 min  Quad stretch standing x 3, 30 sec  Hamstring and ITB x 2 each , 30 sec  Supine knee extension mid range loading with red  band on opposite foot x 20  Pilates Reformer used for LE/core strength, postural strength, lumbopelvic disassociation and core control.  Exercises included: Footwork 2 red 1 blue 1 Yellow   Parallel on heels , turnout on heels  Toes in parallel , added heel stretch   Feet in Straps 1 Red 1 Yellow Circles, each direction x 8 Scooter 1 red   2PATIENT EDUCATION:  Education details: HEP Person educated: Patient Education method: Consulting civil engineer, Media planner, Corporate treasurer cues, Verbal cues Education comprehension: verbalized understanding, returned demonstration, verbal cues required, tactile cues required, and needs further education   HOME EXERCISE PROGRAM: Access Code: [email or web address]    ASSESSMENT: CLINICAL IMPRESSION:   ***  Patient tolerated therapy well with no adverse effects. Therapy focused primarily on progressing quad strength and control. She does demonstrate improved quad control this visit, she was able to perform active knee hyper extension and progress to forward heel tap and 4" box with proper control. Overall, patient does continue to demonstrate gross quad weakness and endurance deficit of the left quad likely contributing to her feeling of stiffness. Patient would benefit from continued skilled PT to progress her strength and dynamic control in order  to maximize functional ability and return to prior level of exercise.   Objective impairments include Abnormal gait, decreased activity tolerance, decreased balance, difficulty walking, decreased ROM, decreased strength, impaired flexibility, and pain.   GOALS: Goals reviewed with patient? Yes   SHORT TERM GOALS:   STG Name Target Date Goal status  1 Patient will be I with initial HEP in order to progress with therapy. Baseline: progressing with HEP 02/23/2021 ACHEIVED  2 PT will review FOTO with patient by 3rd visit in order to understand expected progress and outcome with therapy. Baseline: reviewed 02/23/2021 ACHIEVED  3 Patient will achieve 110 deg active left knee flexion to improve gait and transfers Baseline: 125 deg on 02/02/21 02/23/2021 ACHIEVED  4 Patient will be able to ambulate community level distances without AD to improve mobility Baseline: patient weaning from single axillary  crutch 02/23/2021 ACHIEVED     LONG TERM GOALS:    LTG Name Target Date Goal status  1 Patient will be I with final HEP to maintain progress from PT. Baseline: progressing 03/23/2021 ONGOING  2 Patient will report >/= 65% status on FOTO to indicate improved functional ability. Baseline: 65% 03/23/2021 ACHIEVED  3 Patient will achieve >/= 130 deg active left knee flexion to return to exercise with limitation Baseline: 125 deg at last assessment  03/23/2021 ONGOING  4 Patient will demonstrate 5/5 left knee strength to normalize stair negotiation Baseline: 4+/5 MMT 03/23/2021 ONGOING  5 Patient will report </= 1/10 pain with exercise to return to active lifestyle Baseline: patient reports tightness and soreness with extended activity 03/23/2021 ONGOING     PLAN: PT FREQUENCY: 2x/week   PT DURATION: 8 weeks   PLANNED INTERVENTIONS: Therapeutic exercises, Therapeutic activity, Neuro Muscular re-education, Balance training, Gait training, Patient/Family education, Joint mobilization, Stair training, Aquatic Therapy, Dry Needling, Electrical stimulation, Cryotherapy, Moist heat, Taping, Vasopneumatic device, and Manual therapy   PLAN FOR NEXT SESSION:   *** Cont standing strength, knee control and stability .

## 2021-03-14 NOTE — Therapy (Signed)
OUTPATIENT PHYSICAL THERAPY TREATMENT NOTE   Patient Name: Jamie Mccarthy MRN: [id number] DOB:1976/01/11, 46 y.o., female 40 Date: 03/15/2021  PCP: Hali Marry, MD REFERRING PROVIDER: Earlie Server, MD   PT End of Session - 03/15/21 1508     Visit Number 11    Number of Visits 16    Date for PT Re-Evaluation 03/23/21    Authorization Type [employer] / [employer]    PT Start Time 2836    PT Stop Time 6294    PT Time Calculation (min) 52 min    Activity Tolerance Patient tolerated treatment well    Behavior During Therapy WFL for tasks assessed/performed                      Past Medical History:  Diagnosis Date   Anemia    Has sickle cell trait   Anxiety    Depression    GERD (gastroesophageal reflux disease)    diet controlled   Gestational diabetes    H/O vaginal delivery 2010   Headache(784.0)    Migraines   Herpes 2007   hx of   Iron deficiency anemia, unspecified 10/20/2012   Neuromuscular disorder ([place])    Obese    PONV (postoperative nausea and vomiting)    has had PONV with previous c/s and also lap band surgery 2004   Raynaud's disease    Seasonal allergies    Past Surgical History:  Procedure Laterality Date   BILATERAL SALPINGECTOMY  07/16/2012   Procedure: BILATERAL DISTAL SALPINGECTOMY;  Surgeon: Logan Bores, MD;  Location: [facility];  Service: Obstetrics;;   CESAREAN SECTION  2012   CESAREAN SECTION N/A 07/16/2012   Procedure: REPEAT CESAREAN SECTION;  Surgeon: Logan Bores, MD;  Location: [facility];  Service: Obstetrics;  Laterality: N/A;   CHOLECYSTECTOMY N/A 11/14/2012   Procedure: LAPAROSCOPIC CHOLECYSTECTOMY;  Surgeon: Gayland Curry, MD;  Location: [facility];  Service: General;  Laterality: N/A;   GASTRIC BANDING PORT REVISION N/A 11/14/2012   Procedure: GASTRIC BANDING PORT REVISION;  Surgeon: Gayland Curry, MD;  Location: [facility];  Service: General;  Laterality: N/A;  PORT PLACEMENT MOVED NEW MESH INSERTED     GASTRIC ROUX-EN-Y N/A 12/21/2019   Procedure: LAPAROSCOPIC ROUX-EN-Y GASTRIC BYPASS;  Surgeon: [name], MD;  Location: [facility];  Service: General;  Laterality: N/A;   LAPAROSCOPIC GASTRIC BANDING  2005   LAPAROSCOPIC VAGINAL HYSTERECTOMY WITH SALPINGECTOMY Bilateral 10/12/2015   Procedure: HYSTERECTOMY TOTAL LAPAROSCOPIC BILATERAL SALPINGECTOMY;  Surgeon: Sherlyn Hay, DO;  Location: [facility];  Service: Gynecology;  Laterality: Bilateral;   reposition gastric band  2006   x2   TUBAL LIGATION  2014   UPPER GI ENDOSCOPY N/A 12/21/2019   Procedure: UPPER GI ENDOSCOPY;  Surgeon: [name], MD;  Location: [facility];  Service: General;  Laterality: N/A;   [place]   Patient Active Problem List   Diagnosis Date Noted   Low libido 06/17/2020   Sleep disturbance 06/17/2020   Controlled type 2 diabetes mellitus with complication, without long-term current use of insulin ([place]) 03/08/2020   S/P total gastrectomy and Roux-en-Y esophagojejunal anastomosis 12/29/2019   Severe obesity (BMI >= 40) ([place]) 12/21/2019   S/P gastric bypass 12/21/2019   Paresthesias 10/08/2019   Migraine headache without aura 09/22/2019   History of diabetes mellitus, type II 07/14/2019   Acute meniscal tear of left knee 01/19/2019   History of gestational diabetes 01/01/2019  Abnormal uterine bleeding 01/01/2019   Genital herpes simplex 11/14/2018   Seborrheic dermatitis of scalp 08/07/2018   Anxiety and depression 05/27/2018   Orthostasis 12/06/2017   Trochanteric bursitis, right hip 03/06/2016   Menorrhagia 10/12/2015   Fibroid 10/12/2015   S/P laparoscopic hysterectomy 10/12/2015   Lumbosacral strain 08/18/2014   Iron deficiency anemia, unspecified 10/20/2012   H/O laparoscopic adjustable gastric banding 02/2003 10/02/2012   Regurgitation 10/02/2012   S/P repeat low transverse C-section 07/17/2012   Sickle cell trait ([place]) 12/10/2011   Allergic rhinitis 06/14/2010   GERD 12/27/2008    Catamenial migraine 10/30/2005   RHINITIS, ALLERGIC 10/30/2005    REFERRING PROVIDER: Earlie Server, MD REFERRING DIAG: Post-op Left Knee Scope Medial Menisectomy Possible Micro Fracture ONSET DATE: 01/12/2021  THERAPY DIAG:  Chronic pain of left knee  Muscle weakness (generalized)  PERTINENT HISTORY: N/A  PRECAUTIONS: None WEIGHT BEARING RESTRICTIONS: WBAT  SUBJECTIVE: Patient reports knee stiffness, states it is always stiff.  I haven't been to the Pilates class yet.   I also am [facility]  [facility] of [facility] [employer]. Can [facility] go?   PAIN:  Are you having pain? Yes  VAS scale: 4/10 stiffness  Pain location: Knee Pain orientation: Left  PAIN TYPE: Surgical, chronic Pain description: intermittent  PATIENT GOALS: Get back to working out (pilates and pure barre)   OBJECTIVE: (BOLDED MEASURES ASSESSED THIS VISIT) PATIENT SURVEYS:  FOTO 47% functional status   LE AROM/PROM:   A/PROM Right 01/26/2021 Left 01/26/2021 Left 01/31/2021 Left 02/02/2021 Left  03/15/21  Knee flexion 135 83 (94 PROM) 120 (124 PROM, 133 following MET) 125 136  Knee extension 5 hyper 5 hyper       LE MMT:   MMT Right 01/26/2021 Left 01/26/2021 Left 02/09/2021 Left 03/09/2021  Hip flexion 4 4-    Hip extension 4 4-    Hip abduction 4 4-    Knee flexion 5 4-    Knee extension 5 Mod. quad activation 4-; patient able to perform SLR without quad lag 4     SLS: patient able to maintain left SLS 30+ seconds    TODAY'S TREATMENT:   03/15/21:  Therapeutic exercise: Recumbent bike L2 hills for 6 min Knee flexion to 137 deg with AAROM SLR up and out x 10 each "L", propped on elbows Bridging x 15 reps  Bridge with march x 10 each  Knee extension 15 lbs x 10 x 3 (double, single, then LLE eccentric)  Hamstring curl 25 lbs double then 20 lbs single leg.  Leg press double leg 2 plates, then single leg 1 plates Sissy squat (heels up) no wgt x 10 then 15 lbs KB x 10  Step up 8 inch x 10 x 2 on LLE  Quick heel raises x  15 x 15 lbs Hip abduction x 10 (standing LLE)    Cold pack 10 min L knee    03/09/2021:  Therapeutic Exercise: Recumbent bike L2 x 5 min while taking subjective Longsitting quad set 5 x 5 sec - focus on popping heel off table  Seated quad set kicking heel into physioball and resistance at knee with red band 2 x 10 with 5 sec hold LAQ with 2# 2 x 10 with 2 sec hold Forward heel tap on 4" box with TRX support 2 x 10 (left only) Sidelying hip abduction with 2# at ankle 2 x 15 each Split squat at counter with BOSU knee tap 2 x 15 (left only) SLS on Airex 2 x  30 sec each   03/07/21 PATIENT EDUCATION:  Education details: HEP Person educated: Patient Education method: Education officer, environmental, Corporate treasurer cues, Verbal cues Education comprehension: verbalized understanding, returned demonstration, verbal cues required, tactile cues required, and needs further education   HOME EXERCISE PROGRAM: Access Code: [email or web address]    ASSESSMENT: CLINICAL IMPRESSION: Patient continues to show significant fatigue with quad exercises.  Able to bend knee to 137 deg today with AAROM. [facility] or at [facility] studio. She has been afraid to push it and I told her that at this point she is safe to do so progressively with lighter weights and gradually increasing. She feels stiffness which may last beyond her time at PT gradually lessening as she gets stronger.    Objective impairments include Abnormal gait, decreased activity tolerance, decreased balance, difficulty walking, decreased ROM, decreased strength, impaired flexibility, and pain.   GOALS: Goals reviewed with patient? Yes   SHORT TERM GOALS:   STG Name Target Date Goal status  1 Patient will be I with initial HEP in order to progress with therapy. Baseline: progressing with HEP 02/23/2021 ACHEIVED  2 PT will review FOTO with patient by 3rd visit in order to understand expected progress and outcome with  therapy. Baseline: reviewed 02/23/2021 ACHIEVED  3 Patient will achieve 110 deg active left knee flexion to improve gait and transfers Baseline: 125 deg on 02/02/21 02/23/2021 ACHIEVED  4 Patient will be able to ambulate community level distances without AD to improve mobility Baseline: patient weaning from single axillary crutch 02/23/2021 ACHIEVED     LONG TERM GOALS:    LTG Name Target Date Goal status  1 Patient will be I with final HEP to maintain progress from PT. Baseline: progressing 03/23/2021 ONGOING  2 Patient will report >/= 65% status on FOTO to indicate improved functional ability. Baseline: 65% 03/23/2021 ACHIEVED  3 Patient will achieve >/= 130 deg active left knee flexion to return to exercise with limitation Baseline: 125 deg at last assessment  03/23/2021 ACHIEVED  4 Patient will demonstrate 5/5 left knee strength to normalize stair negotiation Baseline: 4+/5 MMT 03/23/2021 ONGOING  5 Patient will report </= 1/10 pain with exercise to return to active lifestyle Baseline: patient reports tightness and soreness with extended activity 03/23/2021 ONGOING     PLAN: PT FREQUENCY: 2x/week   PT DURATION: 8 weeks   PLANNED INTERVENTIONS: Therapeutic exercises, Therapeutic activity, Neuro Muscular re-education, Balance training, Gait training, Patient/Family education, Joint mobilization, Stair training, Aquatic Therapy, Dry Needling, Electrical stimulation, Cryotherapy, Moist heat, Taping, Vasopneumatic device, and Manual therapy   PLAN FOR NEXT SESSION:  Cont standing strength, knee control and stability .   Raeford Razor, PT 03/15/21 3:49 PM Phone: [phone] Fax: [phone]

## 2021-03-15 ENCOUNTER — Encounter: Payer: Self-pay | Admitting: Physical Therapy

## 2021-03-15 ENCOUNTER — Other Ambulatory Visit: Payer: Self-pay

## 2021-03-15 ENCOUNTER — Ambulatory Visit: Payer: BC Managed Care – PPO | Admitting: Physical Therapy

## 2021-03-15 DIAGNOSIS — M25562 Pain in left knee: Secondary | ICD-10-CM | POA: Diagnosis not present

## 2021-03-15 DIAGNOSIS — M6281 Muscle weakness (generalized): Secondary | ICD-10-CM

## 2021-03-15 DIAGNOSIS — G8929 Other chronic pain: Secondary | ICD-10-CM

## 2021-03-16 ENCOUNTER — Ambulatory Visit: Payer: BC Managed Care – PPO | Admitting: Physical Therapy

## 2021-03-21 ENCOUNTER — Ambulatory Visit: Payer: BC Managed Care – PPO | Admitting: Physical Therapy

## 2021-03-21 NOTE — Therapy (Incomplete)
OUTPATIENT PHYSICAL THERAPY TREATMENT NOTE   Patient Name: Jamie Mccarthy MRN: [id number] DOB:[birth year], 46 y.o., female 7 Date: 03/21/2021  PCP: Hali Marry, MD REFERRING PROVIDER: Earlie Server, MD              Past Medical History:  Diagnosis Date   Anemia    Has sickle cell trait   Anxiety    Depression    GERD (gastroesophageal reflux disease)    diet controlled   Gestational diabetes    H/O vaginal delivery 2010   Headache(784.0)    Migraines   Herpes 2007   hx of   Iron deficiency anemia, unspecified 10/20/2012   Neuromuscular disorder ([place])    Obese    PONV (postoperative nausea and vomiting)    has had PONV with previous c/s and also lap band surgery 2004   Raynaud's disease    Seasonal allergies    Past Surgical History:  Procedure Laterality Date   BILATERAL SALPINGECTOMY  07/16/2012   Procedure: BILATERAL DISTAL SALPINGECTOMY;  Surgeon: Logan Bores, MD;  Location: [facility];  Service: Obstetrics;;   CESAREAN SECTION  2012   CESAREAN SECTION N/A 07/16/2012   Procedure: REPEAT CESAREAN SECTION;  Surgeon: Logan Bores, MD;  Location: [facility];  Service: Obstetrics;  Laterality: N/A;   CHOLECYSTECTOMY N/A 11/14/2012   Procedure: LAPAROSCOPIC CHOLECYSTECTOMY;  Surgeon: Gayland Curry, MD;  Location: [facility];  Service: General;  Laterality: N/A;   GASTRIC BANDING PORT REVISION N/A 11/14/2012   Procedure: GASTRIC BANDING PORT REVISION;  Surgeon: Gayland Curry, MD;  Location: [facility];  Service: General;  Laterality: N/A;  PORT PLACEMENT MOVED NEW MESH INSERTED    GASTRIC ROUX-EN-Y N/A 12/21/2019   Procedure: LAPAROSCOPIC ROUX-EN-Y GASTRIC BYPASS;  Surgeon: [name], MD;  Location: [facility];  Service: General;  Laterality: N/A;   LAPAROSCOPIC GASTRIC BANDING  2005   LAPAROSCOPIC VAGINAL HYSTERECTOMY WITH SALPINGECTOMY Bilateral 10/12/2015   Procedure: HYSTERECTOMY TOTAL LAPAROSCOPIC BILATERAL SALPINGECTOMY;  Surgeon: Sherlyn Hay, DO;  Location: [facility];  Service: Gynecology;  Laterality: Bilateral;   reposition gastric band  2006   x2   TUBAL LIGATION  2014   UPPER GI ENDOSCOPY N/A 12/21/2019   Procedure: UPPER GI ENDOSCOPY;  Surgeon: [name], MD;  Location: [facility];  Service: General;  Laterality: N/A;   [place]   Patient Active Problem List   Diagnosis Date Noted   Low libido 06/17/2020   Sleep disturbance 06/17/2020   Controlled type 2 diabetes mellitus with complication, without long-term current use of insulin ([place]) 03/08/2020   S/P total gastrectomy and Roux-en-Y esophagojejunal anastomosis 12/29/2019   Severe obesity (BMI >= 40) ([place]) 12/21/2019   S/P gastric bypass 12/21/2019   Paresthesias 10/08/2019   Migraine headache without aura 09/22/2019   History of diabetes mellitus, type II 07/14/2019   Acute meniscal tear of left knee 01/19/2019   History of gestational diabetes 01/01/2019   Abnormal uterine bleeding 01/01/2019   Genital herpes simplex 11/14/2018   Seborrheic dermatitis of scalp 08/07/2018   Anxiety and depression 05/27/2018   Orthostasis 12/06/2017   Trochanteric bursitis, right hip 03/06/2016   Menorrhagia 10/12/2015   Fibroid 10/12/2015   S/P laparoscopic hysterectomy 10/12/2015   Lumbosacral strain 08/18/2014   Iron deficiency anemia, unspecified 10/20/2012   H/O laparoscopic adjustable gastric banding 02/2003 10/02/2012   Regurgitation 10/02/2012   S/P repeat low transverse C-section 07/17/2012   Sickle cell trait ([place]) 12/10/2011   Allergic  rhinitis 06/14/2010   GERD 12/27/2008   Catamenial migraine 10/30/2005   RHINITIS, ALLERGIC 10/30/2005    REFERRING PROVIDER: Earlie Server, MD REFERRING DIAG: Post-op Left Knee Scope Medial Menisectomy Possible Micro Fracture ONSET DATE: 01/12/2021  THERAPY DIAG:  No diagnosis found.  PERTINENT HISTORY: N/A  PRECAUTIONS: None WEIGHT BEARING RESTRICTIONS: WBAT  SUBJECTIVE: ***  PAIN:  Are you  having pain? Yes  VAS scale: 4/10 stiffness  Pain location: Knee Pain orientation: Left  PAIN TYPE: Surgical, chronic Pain description: intermittent  PATIENT GOALS: Get back to working out (pilates and pure barre)   OBJECTIVE: (BOLDED MEASURES ASSESSED THIS VISIT) PATIENT SURVEYS:  FOTO 47% functional status   LE AROM/PROM:   A/PROM Right 01/26/2021 Left 01/26/2021 Left 01/31/2021 Left 02/02/2021 Left  03/15/21  Knee flexion 135 83 (94 PROM) 120 (124 PROM, 133 following MET) 125 136  Knee extension 5 hyper 5 hyper       LE MMT:   MMT Right 01/26/2021 Left 01/26/2021 Left 02/09/2021 Left 03/09/2021  Hip flexion 4 4-    Hip extension 4 4-    Hip abduction 4 4-    Knee flexion 5 4-    Knee extension 5 Mod. quad activation 4-; patient able to perform SLR without quad lag 4     SLS: patient able to maintain left SLS 30+ seconds    TODAY'S TREATMENT:  03/23/2021:  Therapeutic Exercise: Recumbent bike L2 x 5 min while taking subjective    03/15/21:  Therapeutic exercise: Recumbent bike L2 hills for 6 min Knee flexion to 137 deg with AAROM SLR up and out x 10 each "L", propped on elbows Bridging x 15 reps  Bridge with march x 10 each  Knee extension 15 lbs x 10 x 3 (double, single, then LLE eccentric)  Hamstring curl 25 lbs double then 20 lbs single leg.  Leg press double leg 2 plates, then single leg 1 plates Sissy squat (heels up) no wgt x 10 then 15 lbs KB x 10  Step up 8 inch x 10 x 2 on LLE  Quick heel raises x 15 x 15 lbs Hip abduction x 10 (standing LLE)    Cold pack 10 min L knee    03/09/2021:  Therapeutic Exercise: Recumbent bike L2 x 5 min while taking subjective Longsitting quad set 5 x 5 sec - focus on popping heel off table  Seated quad set kicking heel into physioball and resistance at knee with red band 2 x 10 with 5 sec hold LAQ with 2# 2 x 10 with 2 sec hold Forward heel tap on 4" box with TRX support 2 x 10 (left only) Sidelying hip abduction with 2#  at ankle 2 x 15 each Split squat at counter with BOSU knee tap 2 x 15 (left only) SLS on Airex 2 x 30 sec each  PATIENT EDUCATION:  Education details: HEP Person educated: Patient Education method: Consulting civil engineer, Demonstration, Corporate treasurer cues, Verbal cues Education comprehension: verbalized understanding, returned demonstration, verbal cues required, tactile cues required, and needs further education   HOME EXERCISE PROGRAM: Access Code: [email or web address]    ASSESSMENT: CLINICAL IMPRESSION: Patient continues to show significant fatigue with quad exercises.  Able to bend knee to 137 deg today with AAROM. [facility] or [facility]. She has been afraid to push it and I told her that at this point she is safe to do so progressively with lighter weights and gradually increasing. She feels  stiffness which may last beyond her time at PT gradually lessening as she gets stronger.    Objective impairments include Abnormal gait, decreased activity tolerance, decreased balance, difficulty walking, decreased ROM, decreased strength, impaired flexibility, and pain.   GOALS: Goals reviewed with patient? Yes   SHORT TERM GOALS:   STG Name Target Date Goal status  1 Patient will be I with initial HEP in order to progress with therapy. Baseline: progressing with HEP 02/23/2021 ACHEIVED  2 PT will review FOTO with patient by 3rd visit in order to understand expected progress and outcome with therapy. Baseline: reviewed 02/23/2021 ACHIEVED  3 Patient will achieve 110 deg active left knee flexion to improve gait and transfers Baseline: 125 deg on 02/02/21 02/23/2021 ACHIEVED  4 Patient will be able to ambulate community level distances without AD to improve mobility Baseline: patient weaning from single axillary crutch 02/23/2021 ACHIEVED     LONG TERM GOALS:    LTG Name Target Date Goal status  1 Patient will be I with final HEP to maintain progress from PT. Baseline:  progressing 03/23/2021 ONGOING  2 Patient will report >/= 65% status on FOTO to indicate improved functional ability. Baseline: 65% 03/23/2021 ACHIEVED  3 Patient will achieve >/= 130 deg active left knee flexion to return to exercise with limitation Baseline: 125 deg at last assessment  03/23/2021 ACHIEVED  4 Patient will demonstrate 5/5 left knee strength to normalize stair negotiation Baseline: 4+/5 MMT 03/23/2021 ONGOING  5 Patient will report </= 1/10 pain with exercise to return to active lifestyle Baseline: patient reports tightness and soreness with extended activity 03/23/2021 ONGOING     PLAN: PT FREQUENCY: 2x/week   PT DURATION: 8 weeks   PLANNED INTERVENTIONS: Therapeutic exercises, Therapeutic activity, Neuro Muscular re-education, Balance training, Gait training, Patient/Family education, Joint mobilization, Stair training, Aquatic Therapy, Dry Needling, Electrical stimulation, Cryotherapy, Moist heat, Taping, Vasopneumatic device, and Manual therapy   PLAN FOR NEXT SESSION:  Cont standing strength, knee control and stability .    Hilda Blades, PT, DPT, LAT, ATC 03/21/21  4:42 PM Phone: [phone] Fax: [phone]

## 2021-03-23 ENCOUNTER — Ambulatory Visit: Payer: BC Managed Care – PPO | Admitting: Physical Therapy

## 2021-03-28 ENCOUNTER — Encounter: Payer: BC Managed Care – PPO | Admitting: Physical Therapy

## 2021-03-30 ENCOUNTER — Ambulatory Visit: Payer: BC Managed Care – PPO | Admitting: Physical Therapy

## 2021-05-01 ENCOUNTER — Other Ambulatory Visit (HOSPITAL_BASED_OUTPATIENT_CLINIC_OR_DEPARTMENT_OTHER): Payer: Self-pay

## 2021-05-01 ENCOUNTER — Telehealth: Payer: Self-pay

## 2021-05-01 ENCOUNTER — Encounter: Payer: Self-pay | Admitting: Family Medicine

## 2021-05-01 ENCOUNTER — Ambulatory Visit (INDEPENDENT_AMBULATORY_CARE_PROVIDER_SITE_OTHER): Payer: BC Managed Care – PPO | Admitting: Family Medicine

## 2021-05-01 VITALS — BP 124/68 | HR 75 | Resp 16 | Ht 64.0 in | Wt 223.7 lb

## 2021-05-01 DIAGNOSIS — L299 Pruritus, unspecified: Secondary | ICD-10-CM

## 2021-05-01 DIAGNOSIS — H00012 Hordeolum externum right lower eyelid: Secondary | ICD-10-CM

## 2021-05-01 DIAGNOSIS — G479 Sleep disorder, unspecified: Secondary | ICD-10-CM | POA: Diagnosis not present

## 2021-05-01 DIAGNOSIS — J301 Allergic rhinitis due to pollen: Secondary | ICD-10-CM | POA: Diagnosis not present

## 2021-05-01 DIAGNOSIS — E118 Type 2 diabetes mellitus with unspecified complications: Secondary | ICD-10-CM

## 2021-05-01 DIAGNOSIS — G43009 Migraine without aura, not intractable, without status migrainosus: Secondary | ICD-10-CM

## 2021-05-01 DIAGNOSIS — E1165 Type 2 diabetes mellitus with hyperglycemia: Secondary | ICD-10-CM

## 2021-05-01 DIAGNOSIS — F32A Depression, unspecified: Secondary | ICD-10-CM | POA: Diagnosis not present

## 2021-05-01 DIAGNOSIS — F419 Anxiety disorder, unspecified: Secondary | ICD-10-CM | POA: Diagnosis not present

## 2021-05-01 LAB — POCT GLYCOSYLATED HEMOGLOBIN (HGB A1C): Hemoglobin A1C: 6.1 % — AB (ref 4.0–5.6)

## 2021-05-01 MED ORDER — LEVOCETIRIZINE DIHYDROCHLORIDE 5 MG PO TABS: 5.0000 mg | ORAL_TABLET | Freq: Every evening | ORAL | 3 refills | Status: DC

## 2021-05-01 MED ORDER — ERYTHROMYCIN 5 MG/GM OP OINT: 1.0000 "application " | TOPICAL_OINTMENT | Freq: Two times a day (BID) | OPHTHALMIC | 0 refills | Status: DC

## 2021-05-01 MED ORDER — FLUOCINOLONE ACETONIDE 0.01 % OT OIL: 3.0000 [drp] | TOPICAL_OIL | Freq: Every day | OTIC | 1 refills | Status: DC

## 2021-05-01 MED ORDER — AIMOVIG 70 MG/ML ~~LOC~~ SOAJ
SUBCUTANEOUS | 6 refills | Status: DC
  Filled 2021-08-15 – 2021-09-11 (×5): qty 1, 30d supply, fill #0
  Filled 2021-10-13 – 2021-12-02 (×3): qty 1, 30d supply, fill #1
  Filled 2021-12-25 – 2022-02-21 (×2): qty 1, 30d supply, fill #2

## 2021-05-01 MED ORDER — OZEMPIC (1 MG/DOSE) 4 MG/3ML ~~LOC~~ SOPN
1.0000 mg | PEN_INJECTOR | SUBCUTANEOUS | 5 refills | Status: DC
  Filled 2021-05-01: qty 3, 28d supply, fill #0
  Filled 2021-07-31: qty 3, 28d supply, fill #1
  Filled 2021-08-26 – 2021-09-11 (×2): qty 3, 28d supply, fill #2
  Filled 2021-10-13: qty 3, 28d supply, fill #3

## 2021-05-01 NOTE — Assessment & Plan Note (Signed)
Has tried melatonin and it was not helpful.  She is having awakening in the middle the night and difficulty going back asleep not significant difficulty with falling asleep.  We discussed a trial Unisom since she already takes Xyzal at bedtime. ?

## 2021-05-01 NOTE — Progress Notes (Signed)
? ?Established Patient Office Visit ? ?Subjective:  ?Patient ID: Jamie Mccarthy, female    DOB: August 23, 1975  Age: 46 y.o. MRN: [id number] ? ?CC:  ?Chief Complaint  ?Patient presents with  ? Diabetes  ?  Follow up   ? Stye  ?  Right eye, 2-3 months   ? Diabetes Eye Exam  ?  Patient will schedule appointment.   ? Mammogram  ?  Patient will schedule appointment with [facility]  ? ? ?HPI ?Jamie Mccarthy presents for  ? ?Weight management-she has also been stress eating recently status post history of gastric Roux-en-Y. ? ?Diabetes - no hypoglycemic events. No wounds or sores that are not healing well. No increased thirst or urination. Checking glucose at home. Taking medications as prescribed without any side effects. A1C today is 6.1.  She has had significant difficulty getting the Ozempic consistently.  In fact she does not have any at all right now.  She says sometimes she is gone as much as a month without the medication. ? ?Stye on her right eye for 2-3 months.   ? ?F/U Migraines - she is on Aimovig and uses Imitrex for rescue. ? ?She also needs a refill on her allergy medicines her allergies have really ramped up she started to get the flaking in her ears again and would like a refill on the steroid oil drops. ? ?Also reports that she has not been sleeping well she is often been waking up in the middle the night around 1 AM and then cannot go back asleep until 4 AM its been going on for at least about 6 months.  She does report some increased depression and anxiety symptoms having some concerns and issues with her teenage daughter ? ?Past Medical History:  ?Diagnosis Date  ? Anemia   ? Has sickle cell trait  ? Anxiety   ? Depression   ? GERD (gastroesophageal reflux disease)   ? diet controlled  ? Gestational diabetes   ? H/O vaginal delivery 2010  ? Headache(784.0)   ? Migraines  ? Herpes 2007  ? hx of  ? Iron deficiency anemia, unspecified 10/20/2012  ? Neuromuscular disorder ([place])   ? Obese   ? PONV  (postoperative nausea and vomiting)   ? has had PONV with previous c/s and also lap band surgery 2004  ? Raynaud's disease   ? Seasonal allergies   ? ? ?Past Surgical History:  ?Procedure Laterality Date  ? BILATERAL SALPINGECTOMY  07/16/2012  ? Procedure: BILATERAL DISTAL SALPINGECTOMY;  Surgeon: Logan Bores, MD;  Location: [facility];  Service: Obstetrics;;  ? CESAREAN SECTION  2012  ? CESAREAN SECTION N/A 07/16/2012  ? Procedure: REPEAT CESAREAN SECTION;  Surgeon: Logan Bores, MD;  Location: [facility];  Service: Obstetrics;  Laterality: N/A;  ? CHOLECYSTECTOMY N/A 11/14/2012  ? Procedure: LAPAROSCOPIC CHOLECYSTECTOMY;  Surgeon: Gayland Curry, MD;  Location: [facility];  Service: General;  Laterality: N/A;  ? GASTRIC BANDING PORT REVISION N/A 11/14/2012  ? Procedure: GASTRIC BANDING PORT REVISION;  Surgeon: Gayland Curry, MD;  Location: [facility];  Service: General;  Laterality: N/A;  PORT PLACEMENT MOVED NEW MESH INSERTED   ? GASTRIC ROUX-EN-Y N/A 12/21/2019  ? Procedure: LAPAROSCOPIC ROUX-EN-Y GASTRIC BYPASS;  Surgeon: [name], MD;  Location: [facility];  Service: General;  Laterality: N/A;  ? LAPAROSCOPIC GASTRIC BANDING  2005  ? LAPAROSCOPIC VAGINAL HYSTERECTOMY WITH SALPINGECTOMY Bilateral 10/12/2015  ? Procedure: HYSTERECTOMY TOTAL LAPAROSCOPIC BILATERAL SALPINGECTOMY;  Surgeon:  Cecilia Dionisio David, DO;  Location: [facility];  Service: Gynecology;  Laterality: Bilateral;  ? reposition gastric band  2006  ? x2  ? TUBAL LIGATION  2014  ? UPPER GI ENDOSCOPY N/A 12/21/2019  ? Procedure: UPPER GI ENDOSCOPY;  Surgeon: [name], MD;  Location: [facility];  Service: General;  Laterality: N/A;  ? [place]  ? ? ?Family History  ?Problem Relation Age of Onset  ? Hypertension Mother   ? Diabetes Other   ?     father's family  ? Thyroid cancer Other   ? Breast cancer Maternal Grandmother   ? ? ?Social History  ? ?Socioeconomic History  ? Marital status: Married  ?  Spouse name: Jamie Mccarthy  ? Number of  children: Not on file  ? Years of education: Not on file  ? Highest education level: Not on file  ?Occupational History  ? Occupation: homemaker  ?Tobacco Use  ? Smoking status: Never  ? Smokeless tobacco: Never  ?Vaping Use  ? Vaping Use: Never used  ?Substance and Sexual Activity  ? Alcohol use: Yes  ?  Comment: occ  ? Drug use: No  ? Sexual activity: Yes  ?  Comment: separated, 2 caffeine drinks daily.   ?Other Topics Concern  ? Not on file  ?Social History Narrative  ? 2 caffeine drinks per day. Stay at home mom. Does yoga and pilates.   ? ?Social Determinants of Health  ? ?Financial Resource Strain: Not on file  ?Food Insecurity: Not on file  ?Transportation Needs: Not on file  ?Physical Activity: Not on file  ?Stress: Not on file  ?Social Connections: Not on file  ?Intimate Partner Violence: Not on file  ? ? ?Outpatient Medications Prior to Visit  ?Medication Sig Dispense Refill  ? AMBULATORY NON FORMULARY MEDICATION Medication Name: Ultrasonic cavitation therapy. Cleared medically for use of this therapy. 1 each 0  ? B Complex-C (B-COMPLEX WITH VITAMIN C) tablet Take 1 tablet by mouth daily.    ? Betamethasone Valerate 0.12 % foam Apply 1 application topically 2 (two) times daily. To scalp 100 g 0  ? Clobetasol Propionate 0.05 % shampoo Wash scalp once or twice weekly with shampoo 118 mL 3  ? ketoconazole (NIZORAL) 2 % cream APPLY 1 APPLICATION TOPICALLY DAILY AS NEEDED FOR IRRITATION. 15 g 0  ? Lancets Misc. KIT Contour Next lancets, patient preference. For testing blood sugars up to twice daily. Dx:E11.65 1 kit 0  ? SUMAtriptan (IMITREX) 100 MG tablet TAKE 1 TABLET BY MOUTH EVERY 2HRS AS NEEDED FOR MIGRAINE. (MAY REPEAT IN 2HRS IF HEADACHE PERSIST) (Patient taking differently: Take 100 mg by mouth every 2 (two) hours as needed for migraine. TAKE 1 TABLET BY MOUTH EVERY 2HRS AS NEEDED FOR MIGRAINE. (MAY REPEAT IN 2HRS IF HEADACHE PERSIST)) 10 tablet 2  ? Vitamin D, Ergocalciferol, (DRISDOL) 1.25 MG (50000  UNIT) CAPS capsule TAKE 1 CAPSULE BY MOUTH ONE TIME PER WEEK 12 capsule 6  ? Erenumab-aooe (AIMOVIG) 70 MG/ML SOAJ INJECT 70MG INTO THE SKIN EVERY 30 DAYS 1 mL 6  ? Fluocinolone Acetonide 0.01 % OIL PLACE 3 DROPS INTO BOTH EARS AT BEDTIME. 20 mL 1  ? levocetirizine (XYZAL) 5 MG tablet Take 1 tablet (5 mg total) by mouth every evening. 90 tablet 3  ? OZEMPIC, 1 MG/DOSE, 4 MG/3ML SOPN INJECT 1 MG INTO THE SKIN ONCE A WEEK. 3 mL 5  ? ?No facility-administered medications prior to visit.  ? ? ?Allergies  ?  Allergen Reactions  ? Latex Itching  ?  Pt states she gets severely itchy with latex  ? Metformin And Related Diarrhea  ? Naproxen Rash  ? ? ?ROS ?Review of Systems ? ?  ?Objective:  ?  ?Physical Exam ?Constitutional:   ?   Appearance: Normal appearance. She is well-developed.  ?HENT:  ?   Head: Normocephalic and atraumatic.  ?Cardiovascular:  ?   Rate and Rhythm: Normal rate and regular rhythm.  ?   Heart sounds: Normal heart sounds.  ?Pulmonary:  ?   Effort: Pulmonary effort is normal.  ?   Breath sounds: Normal breath sounds.  ?Skin: ?   General: Skin is warm and dry.  ?Neurological:  ?   Mental Status: She is alert and oriented to person, place, and time.  ?Psychiatric:     ?   Behavior: Behavior normal.  ? ?BP 124/68   Pulse 75   Resp 16   Ht 5' 4"  (1.626 m)   Wt 223 lb 11.2 oz (101.5 kg)   LMP 08/29/2015 (Approximate)   SpO2 94%   BMI 38.40 kg/m?  ?Wt Readings from Last 3 Encounters:  ?05/01/21 223 lb 11.2 oz (101.5 kg)  ?01/30/21 217 lb 12.8 oz (98.8 kg)  ?09/12/20 223 lb (101.2 kg)  ? ? ? ?Health Maintenance Due  ?Topic Date Due  ? OPHTHALMOLOGY EXAM  Never done  ? Hepatitis C Screening  Never done  ? MAMMOGRAM  04/13/2021  ? ? ?There are no preventive care reminders to display for this patient. ? ?Lab Results  ?Component Value Date  ? TSH 1.04 05/19/2020  ? ?Lab Results  ?Component Value Date  ? WBC 7.8 05/19/2020  ? HGB 13.8 05/19/2020  ? HCT 40.8 05/19/2020  ? MCV 83.4 05/19/2020  ? PLT 342 05/19/2020   ? ?Lab Results  ?Component Value Date  ? NA 140 01/08/2020  ? K 3.8 01/08/2020  ? CO2 24 01/08/2020  ? GLUCOSE 109 (H) 01/08/2020  ? BUN 7 01/08/2020  ? CREATININE 0.62 01/08/2020  ? BILITOT 0.7 01/08/2020  ? ALKPHOS 62

## 2021-05-01 NOTE — Assessment & Plan Note (Signed)
Currently having some increased stressors at home I suspect are probably part of what is impacting her sleep quality.  That she has been experiencing some hot flashes recently so certainly perimenopause is a possibility.  ?

## 2021-05-01 NOTE — Assessment & Plan Note (Signed)
We will go ahead and refill xyzal ?

## 2021-05-01 NOTE — Telephone Encounter (Signed)
Initiated Prior authorization DOD:[id number] (1 MG/DOSE) '4MG'$ /3ML pen-injectors ?Via: Covermymeds ?Case/Key: [id number] ?Status: approved as of 05/01/21 ?Reason:Effective from 05/01/2021 through 04/30/2022. ?Notified Pt via: Mychart ?

## 2021-05-01 NOTE — Assessment & Plan Note (Addendum)
A1C is stable today. F/U in 3-4 months.  Continue to work on [employer].  See if we can get the Ozempic covered at [facility]. ?

## 2021-05-01 NOTE — Assessment & Plan Note (Signed)
We will go ahead and refill her Aimovig.  Uses Imitrex for rescue. ?

## 2021-05-09 ENCOUNTER — Encounter: Payer: Self-pay | Admitting: Family Medicine

## 2021-05-09 NOTE — Telephone Encounter (Signed)
I am confused.  We had sent in a prescription for Ozempic which is similar but more powerful than the Saxenda.  And it looks like the prior authorization was approved.  Was she having problems getting it?  Or was it still costly?  I am not sure.  But I am happy to send in the [place] instead just let me know. ?

## 2021-05-13 ENCOUNTER — Other Ambulatory Visit: Payer: Self-pay | Admitting: Family Medicine

## 2021-05-13 DIAGNOSIS — H00012 Hordeolum externum right lower eyelid: Secondary | ICD-10-CM

## 2021-07-12 ENCOUNTER — Encounter (HOSPITAL_COMMUNITY): Payer: Self-pay | Admitting: *Deleted

## 2021-07-31 ENCOUNTER — Telehealth: Payer: Self-pay | Admitting: Family Medicine

## 2021-07-31 ENCOUNTER — Other Ambulatory Visit (HOSPITAL_BASED_OUTPATIENT_CLINIC_OR_DEPARTMENT_OTHER): Payer: Self-pay

## 2021-07-31 ENCOUNTER — Ambulatory Visit: Payer: BC Managed Care – PPO | Admitting: Family Medicine

## 2021-07-31 NOTE — Telephone Encounter (Signed)
Pt called at 9:10 to say she was enroute and would not arrive until 9:20. Appt was rescheduled.

## 2021-08-04 ENCOUNTER — Encounter: Payer: Self-pay | Admitting: Family Medicine

## 2021-08-04 ENCOUNTER — Telehealth (INDEPENDENT_AMBULATORY_CARE_PROVIDER_SITE_OTHER): Payer: BC Managed Care – PPO | Admitting: Family Medicine

## 2021-08-04 VITALS — Wt 211.0 lb

## 2021-08-04 DIAGNOSIS — E118 Type 2 diabetes mellitus with unspecified complications: Secondary | ICD-10-CM

## 2021-08-04 DIAGNOSIS — Z803 Family history of malignant neoplasm of breast: Secondary | ICD-10-CM

## 2021-08-04 DIAGNOSIS — L299 Pruritus, unspecified: Secondary | ICD-10-CM | POA: Diagnosis not present

## 2021-08-04 DIAGNOSIS — G43009 Migraine without aura, not intractable, without status migrainosus: Secondary | ICD-10-CM

## 2021-08-04 MED ORDER — FLUOCINOLONE ACETONIDE 0.01 % OT OIL: 3.0000 [drp] | TOPICAL_OIL | Freq: Every day | OTIC | 1 refills | Status: DC

## 2021-08-04 MED ORDER — SUMATRIPTAN SUCCINATE 100 MG PO TABS: ORAL_TABLET | ORAL | 2 refills | Status: DC

## 2021-08-04 NOTE — Assessment & Plan Note (Signed)
She will plan to come into the lab hopefully next week to have an A1c drawn.  She is doing well and her weight is down.  She knows that the weight loss is slow but feels like she is moving in the right direction which is fantastic.  Continue to work on [employer] and regular exercise.  She would like to consider going up on the semaglutide that would certainly be reasonable but I would like to see what her next A1c is first because if it looks great then we may be able to just stay at 1 mg.

## 2021-08-04 NOTE — Progress Notes (Signed)
BS 90

## 2021-08-04 NOTE — Assessment & Plan Note (Signed)
Mobic.  She has noticed though that if she is delayed in giving her shot that she will tend to get a migraine but is not nearly as severe without it.  She would like an in for refill on Imitrex just to have.

## 2021-08-04 NOTE — Progress Notes (Addendum)
Virtual Visit via Video Note  I connected with Jamie Mccarthy on 08/16/21 at  1:00 PM EDT by a video enabled telemedicine application and verified that I am speaking with the correct person using two identifiers.   I discussed the limitations of evaluation and management by telemedicine and the availability of in person appointments. The patient expressed understanding and agreed to proceed.  Patient location: in car Provider location: in office  Established Patient Office Visit  Subjective   Patient ID: Jamie Mccarthy, female    DOB: 1975/06/20  Age: 46 y.o. MRN: [id number]  Chief Complaint  Patient presents with   Diabetes    HPI  Diabetes - no hypoglycemic events. No wounds or sores that are not healing well. No increased thirst or urination. Checking glucose at home. Taking medications as prescribed without any side effects. Feels vision has changed, but has an eye appointment coming up.  Reports that sometimes she misses her shot for the Ozempic or is delayed in giving it.  She is otherwise doing well on it.  Stye still present on the upper eyelid x 3 months.  Hot compresses.  Appt in August.    Has had some inc stressors as well.  She feels like she is doing okay just pushing through.  Some lefts sided chest discomfort. Noticed it with deep breath, not with activity.  Thinks it could be more musculoskeletal.  Otic dermatitis.  She is using the steroid oil drops and they do help some.  But it does not completely go away.  Still doing pur Bar and piliates.   Fam hx of Br - Mat GM, Mom with thyroid Ca     ROS    Objective:     Wt 211 lb (95.7 kg)   LMP 08/29/2015 (Approximate)   BMI 36.22 kg/m    Physical Exam Vitals and nursing note reviewed.  Constitutional:      Appearance: She is well-developed.  HENT:     Head: Normocephalic and atraumatic.  Cardiovascular:     Rate and Rhythm: Normal rate and regular rhythm.     Heart sounds: Normal heart  sounds.  Pulmonary:     Effort: Pulmonary effort is normal.     Breath sounds: Normal breath sounds.  Skin:    General: Skin is warm and dry.  Neurological:     Mental Status: She is alert and oriented to person, place, and time.  Psychiatric:        Behavior: Behavior normal.      Results for orders placed or performed in visit on 08/04/21  Lipid Panel w/reflex Direct LDL  Result Value Ref Range   Cholesterol 142 <200 mg/dL   HDL 52 > OR = 50 mg/dL   Triglycerides 75 <150 mg/dL   LDL Cholesterol (Calc) 74 mg/dL (calc)   Total CHOL/HDL Ratio 2.7 <5.0 (calc)   Non-HDL Cholesterol (Calc) 90 <130 mg/dL (calc)  COMPLETE METABOLIC PANEL WITH GFR  Result Value Ref Range   Glucose, Bld 81 65 - 99 mg/dL   BUN 6 (L) 7 - 25 mg/dL   Creat 0.67 0.50 - 0.99 mg/dL   eGFR 110 > OR = 60 mL/min/1.98m   BUN/Creatinine Ratio 9 6 - 22 (calc)   Sodium 141 135 - 146 mmol/L   Potassium 4.3 3.5 - 5.3 mmol/L   Chloride 107 98 - 110 mmol/L   CO2 26 20 - 32 mmol/L   Calcium 9.4 8.6 - 10.2  mg/dL   Total Protein 6.3 6.1 - 8.1 g/dL   Albumin 4.1 3.6 - 5.1 g/dL   Globulin 2.2 1.9 - 3.7 g/dL (calc)   AG Ratio 1.9 1.0 - 2.5 (calc)   Total Bilirubin 0.7 0.2 - 1.2 mg/dL   Alkaline phosphatase (APISO) 74 31 - 125 U/L   AST 15 10 - 30 U/L   ALT 13 6 - 29 U/L  CBC  Result Value Ref Range   WBC 5.6 3.8 - 10.8 Thousand/uL   RBC 5.05 3.80 - 5.10 Million/uL   Hemoglobin 13.6 11.7 - 15.5 g/dL   HCT 42.4 35.0 - 45.0 %   MCV 84.0 80.0 - 100.0 fL   MCH 26.9 (L) 27.0 - 33.0 pg   MCHC 32.1 32.0 - 36.0 g/dL   RDW 13.3 11.0 - 15.0 %   Platelets 290 140 - 400 Thousand/uL   MPV 10.7 7.5 - 12.5 fL  Hemoglobin A1c  Result Value Ref Range   Hgb A1c MFr Bld 5.6 <5.7 % of total Hgb   Mean Plasma Glucose 114 mg/dL   eAG (mmol/L) 6.3 mmol/L  Urine Microalbumin w/creat. ratio  Result Value Ref Range   Creatinine, Urine 107 20 - 275 mg/dL   Microalb, Ur <0.2 mg/dL   Microalb Creat Ratio NOTE <30 mcg/mg creat        The 10-year ASCVD risk score (Arnett DK, et al., 2019) is: 1.6%    Assessment & Plan:   Problem List Items Addressed This Visit       Cardiovascular and Mediastinum   Migraine headache without aura    Mobic.  She has noticed though that if she is delayed in giving her shot that she will tend to get a migraine but is not nearly as severe without it.  She would like an in for refill on Imitrex just to have.      Relevant Medications   SUMAtriptan (IMITREX) 100 MG tablet     Endocrine   Controlled type 2 diabetes mellitus with complication, without long-term current use of insulin ([place]) - Primary    She will plan to come into the lab hopefully next week to have an A1c drawn.  She is doing well and her weight is down.  She knows that the weight loss is slow but feels like she is moving in the right direction which is fantastic.  Continue to work on [employer] and regular exercise.  She would like to consider going up on the semaglutide that would certainly be reasonable but I would like to see what her next A1c is first because if it looks great then we may be able to just stay at 1 mg.      Relevant Orders   Lipid Panel w/reflex Direct LDL (Completed)   COMPLETE METABOLIC PANEL WITH GFR (Completed)   CBC (Completed)   Hemoglobin A1c (Completed)   Urine Microalbumin w/creat. ratio (Completed)     Other   Family history of breast cancer    She would like to consider having genetic testing done.  So we did discuss the myriad genetic BRCA testing.  We did discuss that sometimes insurance does not cover it often times they recommend that you have 2 family members with breast cancer call.  Her maternal grandmother had breast cancer and her mother had thyroid cancer.  We could always get the labs and fill the paperwork and submit.  They will verify with insurance whether or not it is covered  before they run the test and I think that would be a reasonable option we can always  schedule that with a nurse visit.      Other Visit Diagnoses     Ear itch       Relevant Medications   Fluocinolone Acetonide 0.01 % OIL       Ear itch with otic dermatitis.  We did discuss that at some point I can always refer her to dermatology if she would like.  For now she would like a refill on the drops.   I discussed the assessment and treatment plan with the patient. The patient was provided an opportunity to ask questions and all were answered. The patient agreed with the plan and demonstrated an understanding of the instructions.   The patient was advised to call back or seek an in-person evaluation if the symptoms worsen or if the condition fails to improve as anticipated.   Return in about 4 months (around 12/05/2021) for Diabetes follow-up.   I spent 35 minutes on the day of the encounter to include pre-visit record review, face-to-face time with the patient and post visit ordering of test.   Beatrice Lecher, MD

## 2021-08-04 NOTE — Assessment & Plan Note (Signed)
She would like to consider having genetic testing done.  So we did discuss the myriad genetic BRCA testing.  We did discuss that sometimes insurance does not cover it often times they recommend that you have 2 family members with breast cancer call.  Her maternal grandmother had breast cancer and her mother had thyroid cancer.  We could always get the labs and fill the paperwork and submit.  They will verify with insurance whether or not it is covered before they run the test and I think that would be a reasonable option we can always schedule that with a nurse visit.

## 2021-08-07 DIAGNOSIS — E118 Type 2 diabetes mellitus with unspecified complications: Secondary | ICD-10-CM | POA: Diagnosis not present

## 2021-08-08 LAB — MICROALBUMIN / CREATININE URINE RATIO
Creatinine, Urine: 107 mg/dL (ref 20–275)
Microalb, Ur: <0.2 mg/dL

## 2021-08-08 LAB — LIPID PANEL W/REFLEX DIRECT LDL
Cholesterol: 142 mg/dL (ref <200)
HDL: 52 mg/dL (ref >=50)
LDL Cholesterol (Calc): 74 mg/dL (calc)
Non-HDL Cholesterol (Calc): 90 mg/dL (calc) (ref <130)
Total CHOL/HDL Ratio: 2.7 (calc) (ref <5.0)
Triglycerides: 75 mg/dL (ref <150)

## 2021-08-08 LAB — COMPLETE METABOLIC PANEL WITH GFR
AG Ratio: 1.9 (calc) (ref 1.0–2.5)
ALT: 13 U/L (ref 6–29)
AST: 15 U/L (ref 10–30)
Albumin: 4.1 g/dL (ref 3.6–5.1)
Alkaline phosphatase (APISO): 74 U/L (ref 31–125)
BUN/Creatinine Ratio: 9 (calc) (ref 6–22)
BUN: 6 mg/dL — ABNORMAL LOW (ref 7–25)
CO2: 26 mmol/L (ref 20–32)
Calcium: 9.4 mg/dL (ref 8.6–10.2)
Chloride: 107 mmol/L (ref 98–110)
Creat: 0.67 mg/dL (ref 0.50–0.99)
Globulin: 2.2 g/dL (calc) (ref 1.9–3.7)
Glucose, Bld: 81 mg/dL (ref 65–99)
Potassium: 4.3 mmol/L (ref 3.5–5.3)
Sodium: 141 mmol/L (ref 135–146)
Total Bilirubin: 0.7 mg/dL (ref 0.2–1.2)
Total Protein: 6.3 g/dL (ref 6.1–8.1)
eGFR: 110 mL/min/{1.73_m2} (ref >=60)

## 2021-08-08 LAB — CBC
HCT: 42.4 % (ref 35.0–45.0)
Hemoglobin: 13.6 g/dL (ref 11.7–15.5)
MCH: 26.9 pg — ABNORMAL LOW (ref 27.0–33.0)
MCHC: 32.1 g/dL (ref 32.0–36.0)
MCV: 84 fL (ref 80.0–100.0)
MPV: 10.7 fL (ref 7.5–12.5)
Platelets: 290 10*3/uL (ref 140–400)
RBC: 5.05 10*6/uL (ref 3.80–5.10)
RDW: 13.3 % (ref 11.0–15.0)
WBC: 5.6 10*3/uL (ref 3.8–10.8)

## 2021-08-08 LAB — HEMOGLOBIN A1C
Hgb A1c MFr Bld: 5.6 % of total Hgb (ref <5.7)
Mean Plasma Glucose: 114 mg/dL
eAG (mmol/L): 6.3 mmol/L

## 2021-08-08 NOTE — Progress Notes (Signed)
Hi Jamie Mccarthy, metabolic panel overall looks good liver enzymes look great too.  Blood count is normal.  Cholesterol looks great.  A1c is also great it is back down to 5.6.

## 2021-08-14 ENCOUNTER — Encounter: Payer: Self-pay | Admitting: Family Medicine

## 2021-08-15 ENCOUNTER — Other Ambulatory Visit (HOSPITAL_BASED_OUTPATIENT_CLINIC_OR_DEPARTMENT_OTHER): Payer: Self-pay

## 2021-08-20 ENCOUNTER — Other Ambulatory Visit (HOSPITAL_BASED_OUTPATIENT_CLINIC_OR_DEPARTMENT_OTHER): Payer: Self-pay

## 2021-08-23 ENCOUNTER — Telehealth: Payer: Self-pay

## 2021-08-23 NOTE — Telephone Encounter (Addendum)
Initiated Prior authorization IXV:[id number] '70MG'$ /ML auto-injectors Via: Covermymeds Case/Key:[id number] Status: approved  as of 08/23/21 Reason:Effective from 11/04/2020 through 11/03/2021. Notified Pt via: Mychart

## 2021-08-28 ENCOUNTER — Other Ambulatory Visit (HOSPITAL_BASED_OUTPATIENT_CLINIC_OR_DEPARTMENT_OTHER): Payer: Self-pay

## 2021-09-04 ENCOUNTER — Ambulatory Visit (INDEPENDENT_AMBULATORY_CARE_PROVIDER_SITE_OTHER): Payer: BC Managed Care – PPO

## 2021-09-04 ENCOUNTER — Ambulatory Visit
Admission: EM | Admit: 2021-09-04 | Discharge: 2021-09-04 | Disposition: A | Discharge status: Home / Self Care | Payer: BC Managed Care – PPO | Attending: Family Medicine | Admitting: Family Medicine

## 2021-09-04 DIAGNOSIS — R0789 Other chest pain: Secondary | ICD-10-CM

## 2021-09-04 DIAGNOSIS — M940 Chondrocostal junction syndrome [Tietze]: Secondary | ICD-10-CM

## 2021-09-04 DIAGNOSIS — Z9049 Acquired absence of other specified parts of digestive tract: Secondary | ICD-10-CM | POA: Diagnosis not present

## 2021-09-04 NOTE — ED Provider Notes (Signed)
[facility]    CSN: [id number] Arrival date & time: 09/04/21  1341      History   Chief Complaint Chief Complaint  Patient presents with   Headache    HPI Mindi A Mathenia is a 46 y.o. female.   Four days ago patient developed fatigue and a headache.  She has a history of migraines, but notes that her present headache is not severe or typical of a migraine.  Concurrently, she developed left upper chest tightness that radiates to her back, worse with inspiration.  The pain is not pleuritic.  She denies shortness of breath and cough  She denies leg pain or swelling.  The history is provided by the patient.  Chest Pain Pain location:  L chest and L lateral chest Pain quality: dull and tightness   Pain radiates to:  Upper back Pain severity:  Mild Onset quality:  Sudden Duration:  4 days Timing:  Constant Progression:  Unchanged Chronicity:  New Context: breathing   Relieved by:  Nothing Exacerbated by: inspiration. Ineffective treatments:  None tried Associated symptoms: fatigue and headache   Associated symptoms: no abdominal pain, no cough, no diaphoresis, no dysphagia, no fever, no heartburn, no lower extremity edema, no nausea, no palpitations, no shortness of breath and no vomiting     Past Medical History:  Diagnosis Date   Anemia    Has sickle cell trait   Anxiety    Depression    GERD (gastroesophageal reflux disease)    diet controlled   Gestational diabetes    H/O vaginal delivery 2010   Headache(784.0)    Migraines   Herpes 2007   hx of   Iron deficiency anemia, unspecified 10/20/2012   Neuromuscular disorder (HCC)    Obese    PONV (postoperative nausea and vomiting)    has had PONV with previous c/s and also lap band surgery 2004   Raynaud's disease    Seasonal allergies     Patient Active Problem List   Diagnosis Date Noted   Family history of breast cancer 08/04/2021   Low libido 06/17/2020   Sleep disturbance 06/17/2020    Controlled type 2 diabetes mellitus with complication, without long-term current use of insulin ([place]) 03/08/2020   S/P total gastrectomy and Roux-en-Y esophagojejunal anastomosis 12/29/2019   Severe obesity (BMI >= 40) ([place]) 12/21/2019   Paresthesias 10/08/2019   Migraine headache without aura 09/22/2019   History of diabetes mellitus, type II 07/14/2019   Acute meniscal tear of left knee 01/19/2019   History of gestational diabetes 01/01/2019   Abnormal uterine bleeding 01/01/2019   Genital herpes simplex 11/14/2018   Seborrheic dermatitis of scalp 08/07/2018   Anxiety and depression 05/27/2018   Trochanteric bursitis, right hip 03/06/2016   Menorrhagia 10/12/2015   Fibroid 10/12/2015   S/P laparoscopic hysterectomy 10/12/2015   Lumbosacral strain 08/18/2014   Iron deficiency anemia, unspecified 10/20/2012   H/O laparoscopic adjustable gastric banding 02/2003 10/02/2012   Regurgitation 10/02/2012   S/P repeat low transverse C-section 07/17/2012   Sickle cell trait ([place]) 12/10/2011   Allergic rhinitis 06/14/2010   GERD 12/27/2008   Catamenial migraine 10/30/2005   RHINITIS, ALLERGIC 10/30/2005    Past Surgical History:  Procedure Laterality Date   BILATERAL SALPINGECTOMY  07/16/2012   Procedure: BILATERAL DISTAL SALPINGECTOMY;  Surgeon: Logan Bores, MD;  Location: [facility];  Service: Obstetrics;;   CESAREAN SECTION  2012   CESAREAN SECTION N/A 07/16/2012   Procedure: REPEAT CESAREAN SECTION;  Surgeon: Juliann Pulse  Gerarda Fraction, MD;  Location: [facility];  Service: Obstetrics;  Laterality: N/A;   CHOLECYSTECTOMY N/A 11/14/2012   Procedure: LAPAROSCOPIC CHOLECYSTECTOMY;  Surgeon: Gayland Curry, MD;  Location: [facility];  Service: General;  Laterality: N/A;   GASTRIC BANDING PORT REVISION N/A 11/14/2012   Procedure: GASTRIC BANDING PORT REVISION;  Surgeon: Gayland Curry, MD;  Location: [facility];  Service: General;  Laterality: N/A;  PORT PLACEMENT MOVED NEW MESH INSERTED    GASTRIC ROUX-EN-Y  N/A 12/21/2019   Procedure: LAPAROSCOPIC ROUX-EN-Y GASTRIC BYPASS;  Surgeon: [name], MD;  Location: [facility];  Service: General;  Laterality: N/A;   LAPAROSCOPIC GASTRIC BANDING  2005   LAPAROSCOPIC VAGINAL HYSTERECTOMY WITH SALPINGECTOMY Bilateral 10/12/2015   Procedure: HYSTERECTOMY TOTAL LAPAROSCOPIC BILATERAL SALPINGECTOMY;  Surgeon: Sherlyn Hay, DO;  Location: [facility];  Service: Gynecology;  Laterality: Bilateral;   reposition gastric band  2006   x2   TUBAL LIGATION  2014   UPPER GI ENDOSCOPY N/A 12/21/2019   Procedure: UPPER GI ENDOSCOPY;  Surgeon: [name], MD;  Location: [facility];  Service: General;  Laterality: N/A;   WISDOM TOOTH EXTRACTION  1999    OB History     Gravida  3   Para  3   Term  2   Preterm  1   AB  0   Living  4      SAB  0   IAB  0   Ectopic  0   Multiple  1   Live Births  4            Home Medications    Prior to Admission medications   Medication Sig Start Date End Date Taking? Authorizing Provider  B Complex-C (B-COMPLEX WITH VITAMIN C) tablet Take 1 tablet by mouth daily.    [provider]  Betamethasone Valerate 0.12 % foam Apply 1 application topically 2 (two) times daily. To scalp 02/08/20   Hali Marry, MD  Clobetasol Propionate 0.05 % shampoo Wash scalp once or twice weekly with shampoo 03/09/20   Hali Marry, MD  Erenumab-aooe (AIMOVIG) 70 MG/ML SOAJ INJECT 70MG INTO THE SKIN EVERY 30 DAYS 05/01/21   Hali Marry, MD  Fluocinolone Acetonide 0.01 % OIL Place 3 drops into both ears at bedtime. 08/04/21   Hali Marry, MD  ketoconazole (NIZORAL) 2 % cream APPLY 1 APPLICATION TOPICALLY DAILY AS NEEDED FOR IRRITATION. 02/03/21   Silverio Decamp, MD  Lancets Misc. KIT Contour Next lancets, patient preference. For testing blood sugars up to twice daily. Dx:E11.65 07/24/19   Orma Render, NP  levocetirizine (XYZAL) 5 MG tablet Take 1 tablet (5 mg total) by mouth every  evening. 05/01/21   Hali Marry, MD  Semaglutide, 1 MG/DOSE, (OZEMPIC, 1 MG/DOSE,) 4 MG/3ML SOPN Inject 1 mg into the skin once a week. 05/01/21   Hali Marry, MD  SUMAtriptan (IMITREX) 100 MG tablet May repeat in 2 hours if headache persists or recurs. 08/04/21   Hali Marry, MD  Vitamin D, Ergocalciferol, (DRISDOL) 1.25 MG (50000 UNIT) CAPS capsule TAKE 1 CAPSULE BY MOUTH ONE TIME PER WEEK 01/30/21   Hali Marry, MD    Family History Family History  Problem Relation Age of Onset   Hypertension Mother    Diabetes Other        father's family   Thyroid cancer Other    Breast cancer Maternal Grandmother     Social History Social History  Tobacco Use   Smoking status: Never   Smokeless tobacco: Never  Vaping Use   Vaping Use: Never used  Substance Use Topics   Alcohol use: Yes    Comment: occ   Drug use: No     Allergies   Latex, Metformin and related, and Naproxen   Review of Systems Review of Systems  Constitutional:  Positive for fatigue. Negative for activity change, appetite change, chills, diaphoresis and fever.  HENT: Negative.  Negative for trouble swallowing.   Eyes: Negative.   Respiratory:  Positive for chest tightness. Negative for cough, shortness of breath, wheezing and stridor.   Cardiovascular:  Positive for chest pain. Negative for palpitations and leg swelling.  Gastrointestinal:  Negative for abdominal pain, heartburn, nausea and vomiting.  Genitourinary: Negative.   Musculoskeletal: Negative.   Skin:  Negative for rash.  Neurological:  Positive for headaches.     Physical Exam Triage Vital Signs ED Triage Vitals  Enc Vitals Group     BP 09/04/21 1407 133/88     Pulse Rate 09/04/21 1407 86     Resp 09/04/21 1407 14     Temp 09/04/21 1407 98.4 F (36.9 C)     Temp Source 09/04/21 1407 Oral     SpO2 09/04/21 1407 100 %     Weight --      Height --      Head Circumference --      Peak Flow --      Pain  Score 09/04/21 1409 4     Pain Loc --      Pain Edu? --      Excl. in [place]? --    No data found.  Updated Vital Signs BP 133/88 (BP Location: Right Arm)   Pulse 86   Temp 98.4 F (36.9 C) (Oral)   Resp 14   LMP 08/29/2015 (Approximate)   SpO2 100%   Visual Acuity Right Eye Distance:   Left Eye Distance:   Bilateral Distance:    Right Eye Near:   Left Eye Near:    Bilateral Near:     Physical Exam Vitals and nursing note reviewed.  Constitutional:      General: She is not in acute distress.    Appearance: She is not ill-appearing.  HENT:     Head: Normocephalic.     Nose: Nose normal.     Mouth/Throat:     Mouth: Mucous membranes are moist.     Pharynx: Oropharynx is clear.  Eyes:     Conjunctiva/sclera: Conjunctivae normal.     Pupils: Pupils are equal, round, and reactive to light.  Cardiovascular:     Rate and Rhythm: Normal rate and regular rhythm.     Heart sounds: Normal heart sounds.  Pulmonary:     Breath sounds: Normal breath sounds.    Chest:       Comments: There is diffuse tenderness to palpation over patient's left chest extending to the left upper back as noted on diagram.  No rash present.   Abdominal:     Palpations: Abdomen is soft.     Tenderness: There is no abdominal tenderness.  Musculoskeletal:        General: No tenderness.     Cervical back: Neck supple.     Right lower leg: No edema.     Left lower leg: No edema.  Lymphadenopathy:     Cervical: No cervical adenopathy.  Skin:    General: Skin is warm and dry.  Findings: No rash.  Neurological:     Mental Status: She is alert.      UC Treatments / Results  Labs (all labs ordered are listed, but only abnormal results are displayed) Labs Reviewed - No data to display  EKG  Rate:   66 BPM PR:   134 msec QT:   440 msec QTcH:   461 msec QRSD:   78 msec QRS axis:   29 degrees Interpretation:  within normal limits; normal sinus rhythm    Radiology DG Ribs  Unilateral W/Chest Left  Result Date: 09/04/2021 CLINICAL DATA:  Left anterior chest pain EXAM: LEFT RIBS AND CHEST - 3 VIEW COMPARISON:  None Available. FINDINGS: No fracture or other bone lesions are seen involving the ribs. There is no evidence of pneumothorax or pleural effusion. Both lungs are clear. Heart size and mediastinal contours are within normal limits. Cholecystectomy clips. IMPRESSION: Negative. Electronically Signed   By: Yetta Glassman M.D.   On: 09/04/2021 15:05    Procedures Procedures (including critical care time)  Medications Ordered in UC Medications - No data to display  Initial Impression / Assessment and Plan / UC Course  I have reviewed the triage vital signs and the nursing notes.  Pertinent labs & imaging results that were available during my care of the patient were reviewed by me and considered in my medical decision making (see chart for details).    Negative chest x-ray and EKG. Followup with Family Doctor if not improved in about 2 weeks.  Final Clinical Impressions(s) / UC Diagnoses   Final diagnoses:  Costochondritis     Discharge Instructions      May take Ibuprofen 280m, 4 tabs every 8 hours with food for about 5 days.  Try applying an ice pack for 20 to 30 minutes, 3 to 4 times daily  Continue until pain decreases.      ED Prescriptions   None       BKandra Nicolas MD 09/07/21 [phone]

## 2021-09-04 NOTE — Discharge Instructions (Addendum)
May take Ibuprofen '200mg'$ , 4 tabs every 8 hours with food for about 5 days.  Try applying an ice pack for 20 to 30 minutes, 3 to 4 times daily  Continue until pain decreases.

## 2021-09-04 NOTE — ED Triage Notes (Signed)
Pt presents with HA, fatigue, and chest tightness when breathing in x 4 days.

## 2021-09-05 ENCOUNTER — Other Ambulatory Visit (HOSPITAL_BASED_OUTPATIENT_CLINIC_OR_DEPARTMENT_OTHER): Payer: Self-pay

## 2021-09-07 ENCOUNTER — Other Ambulatory Visit (HOSPITAL_BASED_OUTPATIENT_CLINIC_OR_DEPARTMENT_OTHER): Payer: Self-pay

## 2021-09-11 ENCOUNTER — Other Ambulatory Visit (HOSPITAL_BASED_OUTPATIENT_CLINIC_OR_DEPARTMENT_OTHER): Payer: Self-pay

## 2021-09-12 ENCOUNTER — Other Ambulatory Visit: Payer: Self-pay | Admitting: Family Medicine

## 2021-09-12 DIAGNOSIS — B3731 Acute candidiasis of vulva and vagina: Secondary | ICD-10-CM

## 2021-09-15 ENCOUNTER — Ambulatory Visit: Payer: BC Managed Care – PPO

## 2021-09-22 ENCOUNTER — Ambulatory Visit (INDEPENDENT_AMBULATORY_CARE_PROVIDER_SITE_OTHER): Payer: BC Managed Care – PPO | Admitting: Family Medicine

## 2021-09-22 DIAGNOSIS — Z23 Encounter for immunization: Secondary | ICD-10-CM | POA: Diagnosis not present

## 2021-10-11 DIAGNOSIS — H0012 Chalazion right lower eyelid: Secondary | ICD-10-CM | POA: Diagnosis not present

## 2021-10-11 DIAGNOSIS — E119 Type 2 diabetes mellitus without complications: Secondary | ICD-10-CM | POA: Diagnosis not present

## 2021-10-11 LAB — HM DIABETES EYE EXAM

## 2021-10-16 ENCOUNTER — Other Ambulatory Visit (HOSPITAL_BASED_OUTPATIENT_CLINIC_OR_DEPARTMENT_OTHER): Payer: Self-pay

## 2021-10-24 ENCOUNTER — Telehealth: Payer: Self-pay

## 2021-10-24 NOTE — Telephone Encounter (Addendum)
Initiated Prior authorization [id number] '70MG'$ /ML auto-injectors Via: Covermymeds Case/Key:[id number] Status: approved as of 10/24/21 Reason:Effective from 10/24/2021 through 10/23/2022.  Notified Pt via: Mychart

## 2021-10-27 ENCOUNTER — Other Ambulatory Visit (HOSPITAL_BASED_OUTPATIENT_CLINIC_OR_DEPARTMENT_OTHER): Payer: Self-pay

## 2021-10-27 ENCOUNTER — Encounter: Payer: Self-pay | Admitting: Family Medicine

## 2021-10-27 MED ORDER — SEMAGLUTIDE (2 MG/DOSE) 8 MG/3ML ~~LOC~~ SOPN
2.0000 mg | PEN_INJECTOR | SUBCUTANEOUS | 1 refills | Status: DC
  Filled 2021-10-27: qty 3, 28d supply, fill #0
  Filled 2021-11-22: qty 3, 28d supply, fill #1

## 2021-10-27 NOTE — Telephone Encounter (Signed)
Meds ordered this encounter  Medications   Semaglutide, 2 MG/DOSE, 8 MG/3ML SOPN    Sig: Inject 2 mg as directed once a week.    Dispense:  3 mL    Refill:  1   Have her schedule f/u in 2 mo

## 2021-10-30 ENCOUNTER — Other Ambulatory Visit (HOSPITAL_BASED_OUTPATIENT_CLINIC_OR_DEPARTMENT_OTHER): Payer: Self-pay

## 2021-12-01 ENCOUNTER — Other Ambulatory Visit (HOSPITAL_COMMUNITY): Payer: Self-pay

## 2021-12-02 ENCOUNTER — Other Ambulatory Visit (HOSPITAL_COMMUNITY): Payer: Self-pay

## 2021-12-12 ENCOUNTER — Encounter: Payer: Self-pay | Admitting: Family Medicine

## 2021-12-25 ENCOUNTER — Other Ambulatory Visit: Payer: Self-pay | Admitting: Family Medicine

## 2021-12-25 ENCOUNTER — Other Ambulatory Visit (HOSPITAL_COMMUNITY): Payer: Self-pay

## 2021-12-25 DIAGNOSIS — L219 Seborrheic dermatitis, unspecified: Secondary | ICD-10-CM

## 2021-12-25 MED ORDER — B COMPLEX-C PO TABS
1.0000 | ORAL_TABLET | Freq: Every day | ORAL | 3 refills | Status: DC
  Filled 2021-12-25 – 2022-01-25 (×4): qty 90, 90d supply, fill #0

## 2021-12-25 MED ORDER — KETOCONAZOLE 2 % EX CREA
1.0000 | TOPICAL_CREAM | Freq: Every day | CUTANEOUS | 0 refills | Status: AC | PRN
  Filled 2021-12-25: qty 15, 15d supply, fill #0

## 2021-12-25 MED ORDER — OZEMPIC (2 MG/DOSE) 8 MG/3ML ~~LOC~~ SOPN
2.0000 mg | PEN_INJECTOR | SUBCUTANEOUS | 1 refills | Status: DC
  Filled 2021-12-25: qty 3, 28d supply, fill #0
  Filled 2022-01-22 – 2022-01-25 (×2): qty 3, 28d supply, fill #1

## 2021-12-26 ENCOUNTER — Ambulatory Visit (INDEPENDENT_AMBULATORY_CARE_PROVIDER_SITE_OTHER): Payer: BC Managed Care – PPO | Admitting: Family Medicine

## 2021-12-26 ENCOUNTER — Encounter: Payer: Self-pay | Admitting: Family Medicine

## 2021-12-26 VITALS — BP 147/96 | HR 76 | Ht 64.0 in | Wt 212.1 lb

## 2021-12-26 DIAGNOSIS — R232 Flushing: Secondary | ICD-10-CM

## 2021-12-26 DIAGNOSIS — E118 Type 2 diabetes mellitus with unspecified complications: Secondary | ICD-10-CM | POA: Diagnosis not present

## 2021-12-26 DIAGNOSIS — L749 Eccrine sweat disorder, unspecified: Secondary | ICD-10-CM

## 2021-12-26 DIAGNOSIS — F4321 Adjustment disorder with depressed mood: Secondary | ICD-10-CM | POA: Diagnosis not present

## 2021-12-26 DIAGNOSIS — L299 Pruritus, unspecified: Secondary | ICD-10-CM | POA: Diagnosis not present

## 2021-12-26 DIAGNOSIS — L219 Seborrheic dermatitis, unspecified: Secondary | ICD-10-CM | POA: Diagnosis not present

## 2021-12-26 MED ORDER — BETAMETHASONE VALERATE 0.12 % EX FOAM: 1.0000 "application " | Freq: Two times a day (BID) | CUTANEOUS | 0 refills | Status: DC

## 2021-12-26 MED ORDER — FLUOCINOLONE ACETONIDE 0.01 % OT OIL: 3.0000 [drp] | TOPICAL_OIL | Freq: Every day | OTIC | 1 refills | Status: DC

## 2021-12-26 MED ORDER — CLOBETASOL PROPIONATE 0.05 % EX SHAM: MEDICATED_SHAMPOO | CUTANEOUS | 3 refills | Status: AC

## 2021-12-26 MED ORDER — VENLAFAXINE HCL ER 37.5 MG PO CP24: 37.5000 mg | ORAL_CAPSULE | Freq: Every day | ORAL | 1 refills | Status: DC

## 2021-12-26 NOTE — Progress Notes (Signed)
Established Patient Office Visit  Subjective   Patient ID: Jamie Mccarthy, female    DOB: Jun 12, 1975  Age: 46 y.o. MRN: [id number]  Chief Complaint  Patient presents with   Night Sweats    Mood swings and hot flashes    HPI  Patient is 46 year old female presenting to clinic for hot flashes, night sweats, and irritability. These symptoms started about a month ago. She had a partial hysterectomy in 2017. Her mother also had a hysterectomy and went into menopause at 1. Her mother also has thyroid cancer.  Her father recently passed away and she is still going through the grieving process.  Her psoriasis has started to become worst over the last year. She says it's usually on her scalp and her ears. However, now it has started to come down to her eyebrows, neck, and right armpit. She has not used any new deodorants, shampoos, lotions, etc.     Review of Systems  Constitutional:        Hot flashes, night sweats.  Psychiatric/Behavioral:         Irritability  All other systems reviewed and are negative.     Objective:     BP (!) 147/96 (BP Location: Left Arm, Patient Position: Sitting, Cuff Size: Large)   Pulse 76   Ht '5\' 4"'$  (1.626 m)   Wt 96.2 kg   LMP 08/29/2015 (Approximate)   SpO2 100%   BMI 36.41 kg/m    Physical Exam Constitutional:      Appearance: Normal appearance.  Cardiovascular:     Rate and Rhythm: Normal rate and regular rhythm.  Pulmonary:     Effort: Pulmonary effort is normal.     Breath sounds: Normal breath sounds.  Skin:    Findings: Rash present.     Comments: Dry patches along scalp, ears, eyebrows, and right armpit  Neurological:     General: No focal deficit present.     Mental Status: She is alert.    ..    12/26/2021    9:37 AM 05/01/2021    9:29 AM 01/30/2021    8:36 AM  Depression screen PHQ 2/9  Decreased Interest 1 1 0  Down, Depressed, Hopeless 1 2 0  PHQ - 2 Score 2 3 0  Altered sleeping 3 3   Tired, decreased energy 1 3    Change in appetite 1 3   Feeling bad or failure about yourself  2 2   Trouble concentrating 0 2   Moving slowly or fidgety/restless 0 0   Suicidal thoughts 0 0   PHQ-9 Score 9 16   Difficult doing work/chores Somewhat difficult Somewhat difficult    .Marland Kitchen    12/26/2021    9:38 AM 05/01/2021    9:30 AM 09/12/2020    8:36 AM 11/03/2019    5:30 PM  GAD 7 : Generalized Anxiety Score  Nervous, Anxious, on Edge '1 2 1 1  '$ Control/stop worrying '1 3 1 1  '$ Worry too much - different things '1 3 1 1  '$ Trouble relaxing 1 2 0 0  Restless 1 0 0 0  Easily annoyed or irritable '1 3 1 '$ 0  Afraid - awful might happen 0 0 0 0  Total GAD 7 Score '6 13 4 3  '$ Anxiety Difficulty Somewhat difficult Somewhat difficult Somewhat difficult Somewhat difficult      The 10-year ASCVD risk score (Arnett DK, et al., 2019) is: 3.6%    Assessment & Plan:  Problem List Items Addressed This Visit       Endocrine   Controlled type 2 diabetes mellitus with complication, without long-term current use of insulin (HCC) - Primary   Relevant Orders   Estradiol   Follicle stimulating hormone   Luteinizing hormone   Progesterone   TSH   CBC     Musculoskeletal and Integument   Seborrheic dermatitis of scalp   Relevant Medications   Clobetasol Propionate 0.05 % shampoo   Other Visit Diagnoses     Ear itch       Relevant Medications   Fluocinolone Acetonide 0.01 % OIL   Grief       Hot flashes       Relevant Medications   venlafaxine XR (EFFEXOR XR) 37.5 MG 24 hr capsule   Other Relevant Orders   Estradiol   Follicle stimulating hormone   Luteinizing hormone   Progesterone   TSH   CBC   Sweating abnormality       Relevant Orders   Estradiol   Follicle stimulating hormone   Luteinizing hormone   Progesterone   TSH   CBC       Hot Flashes, Irritability, Night Sweats Likely perimenopausal. Going to check FSH, LH, estradiol, and progesterone. Due to family history of thyroid cancer we will also  check TSH.  Grief Going to start effexor. Hopefully this will help with her depressed mood as well as the hot flashes and night sweats.  Controlled Diabetes Going to recheck A1C at end of January. Tolerating Ozempic '2mg'$ . Checking CBC today.  Psoriasis Refilled oil and shampoo. Dermatology referral placed.  Return in about 7 weeks (around 02/13/2022) for New start medication.    Dorian Heckle, Student-PA

## 2021-12-26 NOTE — Progress Notes (Signed)
   I personally interviewed and examined the patient and agree with the specimen and plan below.

## 2021-12-27 ENCOUNTER — Telehealth: Payer: Self-pay

## 2021-12-27 ENCOUNTER — Encounter: Payer: Self-pay | Admitting: Family Medicine

## 2021-12-27 LAB — CBC
HCT: 39.8 % (ref 35.0–45.0)
Hemoglobin: 13.3 g/dL (ref 11.7–15.5)
MCH: 27.7 pg (ref 27.0–33.0)
MCHC: 33.4 g/dL (ref 32.0–36.0)
MCV: 82.9 fL (ref 80.0–100.0)
MPV: 10.6 fL (ref 7.5–12.5)
Platelets: 280 10*3/uL (ref 140–400)
RBC: 4.8 10*6/uL (ref 3.80–5.10)
RDW: 14.1 % (ref 11.0–15.0)
WBC: 5.5 10*3/uL (ref 3.8–10.8)

## 2021-12-27 LAB — ESTRADIOL: Estradiol: 61 pg/mL

## 2021-12-27 LAB — PROGESTERONE: Progesterone: 0.8 ng/mL

## 2021-12-27 LAB — FOLLICLE STIMULATING HORMONE: FSH: 33.4 m[IU]/mL

## 2021-12-27 LAB — LUTEINIZING HORMONE: LH: 20.8 m[IU]/mL

## 2021-12-27 LAB — TSH: TSH: 0.86 mIU/L

## 2021-12-27 NOTE — Telephone Encounter (Addendum)
Initiated Prior authorization [id number] Valerate 0.12% foam Via: Covermymeds Case/Key:[id number]  Status: approved  as of 12/27/21 Reason:Approved, Coverage Starts on: 12/27/2021 12:00:00 AM, Coverage Ends on: 12/27/2022 12:00:00 AM. Notified Pt via: Mychart

## 2021-12-28 ENCOUNTER — Other Ambulatory Visit (HOSPITAL_COMMUNITY): Payer: Self-pay

## 2021-12-29 ENCOUNTER — Other Ambulatory Visit (HOSPITAL_COMMUNITY): Payer: Self-pay

## 2021-12-29 NOTE — Progress Notes (Signed)
Hi Jamie Mccarthy, your hormone levels including thyroid actually look pretty good.  No indication that you are full-blown menopause but certainly could be what we call perimenopausal which is when the body is transitioning.  Your blood count looks great no sign of anemia.  Goals are just to make sure that you are eating healthy to reduce the effects of hormone fluctuations, getting plenty of rest, and exercising regularly.  This often helps with those perimenopausal symptoms.

## 2022-01-06 ENCOUNTER — Other Ambulatory Visit (HOSPITAL_COMMUNITY): Payer: Self-pay

## 2022-01-11 DIAGNOSIS — S46812A Strain of other muscles, fascia and tendons at shoulder and upper arm level, left arm, initial encounter: Secondary | ICD-10-CM | POA: Diagnosis not present

## 2022-01-11 DIAGNOSIS — R519 Headache, unspecified: Secondary | ICD-10-CM | POA: Diagnosis not present

## 2022-01-12 ENCOUNTER — Encounter: Payer: Self-pay | Admitting: Family Medicine

## 2022-01-22 ENCOUNTER — Other Ambulatory Visit: Payer: Self-pay | Admitting: Family Medicine

## 2022-01-22 ENCOUNTER — Other Ambulatory Visit (HOSPITAL_COMMUNITY): Payer: Self-pay

## 2022-01-24 ENCOUNTER — Other Ambulatory Visit (HOSPITAL_COMMUNITY): Payer: Self-pay

## 2022-01-24 MED ORDER — BETAMETHASONE VALERATE 0.12 % EX FOAM
1.0000 "application " | Freq: Two times a day (BID) | CUTANEOUS | 0 refills | Status: DC
  Filled 2022-01-24: qty 100, 14d supply, fill #0

## 2022-01-25 ENCOUNTER — Other Ambulatory Visit (HOSPITAL_COMMUNITY): Payer: Self-pay

## 2022-02-04 ENCOUNTER — Other Ambulatory Visit: Payer: Self-pay | Admitting: Family Medicine

## 2022-02-04 DIAGNOSIS — R5383 Other fatigue: Secondary | ICD-10-CM

## 2022-02-06 ENCOUNTER — Encounter: Payer: Self-pay | Admitting: Family Medicine

## 2022-02-06 ENCOUNTER — Ambulatory Visit (INDEPENDENT_AMBULATORY_CARE_PROVIDER_SITE_OTHER): Payer: BC Managed Care – PPO | Admitting: Family Medicine

## 2022-02-06 VITALS — BP 135/92 | HR 73 | Ht 64.0 in | Wt 207.0 lb

## 2022-02-06 DIAGNOSIS — F4321 Adjustment disorder with depressed mood: Secondary | ICD-10-CM

## 2022-02-06 DIAGNOSIS — M542 Cervicalgia: Secondary | ICD-10-CM | POA: Diagnosis not present

## 2022-02-06 DIAGNOSIS — F32A Depression, unspecified: Secondary | ICD-10-CM

## 2022-02-06 DIAGNOSIS — R5383 Other fatigue: Secondary | ICD-10-CM

## 2022-02-06 DIAGNOSIS — R232 Flushing: Secondary | ICD-10-CM

## 2022-02-06 DIAGNOSIS — S46812A Strain of other muscles, fascia and tendons at shoulder and upper arm level, left arm, initial encounter: Secondary | ICD-10-CM

## 2022-02-06 DIAGNOSIS — Z23 Encounter for immunization: Secondary | ICD-10-CM | POA: Diagnosis not present

## 2022-02-06 DIAGNOSIS — F419 Anxiety disorder, unspecified: Secondary | ICD-10-CM | POA: Diagnosis not present

## 2022-02-06 DIAGNOSIS — E118 Type 2 diabetes mellitus with unspecified complications: Secondary | ICD-10-CM | POA: Diagnosis not present

## 2022-02-06 LAB — POCT GLYCOSYLATED HEMOGLOBIN (HGB A1C): Hemoglobin A1C: 5.9 % — AB (ref 4.0–5.6)

## 2022-02-06 MED ORDER — CYCLOBENZAPRINE HCL 10 MG PO TABS: 5.0000 mg | ORAL_TABLET | Freq: Every evening | ORAL | 0 refills | Status: DC | PRN

## 2022-02-06 MED ORDER — VENLAFAXINE HCL ER 75 MG PO CP24: 75.0000 mg | ORAL_CAPSULE | Freq: Every day | ORAL | 1 refills | Status: DC

## 2022-02-06 MED ORDER — VITAMIN D (ERGOCALCIFEROL) 1.25 MG (50000 UNIT) PO CAPS: ORAL_CAPSULE | ORAL | 6 refills | Status: DC

## 2022-02-06 MED ORDER — OZEMPIC (2 MG/DOSE) 8 MG/3ML ~~LOC~~ SOPN
2.0000 mg | PEN_INJECTOR | SUBCUTANEOUS | 0 refills | Status: DC
  Filled 2022-02-23: qty 9, 84d supply, fill #0

## 2022-02-06 NOTE — Patient Instructions (Addendum)
F/u in 2 weeks for bp check with nurse
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
## 2022-02-06 NOTE — Assessment & Plan Note (Signed)
Doing well with the medication overall.  We discussed maybe upping the dose just a little bit to help reduce some of her current symptoms.  Part of it is she is still grieving as well.  Will bump the Effexor to 75 mg and follow-up in 8 weeks.

## 2022-02-06 NOTE — Assessment & Plan Note (Addendum)
1C still looks great today at 5.9 though it is up a little bit from previous.  Will continue to monitor.  Continue to work on [employer] and regular exercise.  Recheck in 6 months.  Lab Results  Component Value Date   HGBA1C 5.9 (A) 02/06/2022

## 2022-02-08 ENCOUNTER — Ambulatory Visit: Payer: BC Managed Care – PPO | Attending: Family Medicine | Admitting: Physical Therapy

## 2022-02-08 ENCOUNTER — Other Ambulatory Visit: Payer: Self-pay

## 2022-02-08 DIAGNOSIS — R252 Cramp and spasm: Secondary | ICD-10-CM | POA: Diagnosis not present

## 2022-02-08 DIAGNOSIS — M542 Cervicalgia: Secondary | ICD-10-CM | POA: Diagnosis not present

## 2022-02-08 DIAGNOSIS — M25512 Pain in left shoulder: Secondary | ICD-10-CM | POA: Diagnosis not present

## 2022-02-08 DIAGNOSIS — S46812A Strain of other muscles, fascia and tendons at shoulder and upper arm level, left arm, initial encounter: Secondary | ICD-10-CM | POA: Diagnosis not present
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
## 2022-02-13 ENCOUNTER — Ambulatory Visit: Payer: BC Managed Care – PPO | Admitting: Physical Therapy

## 2022-02-13 DIAGNOSIS — M542 Cervicalgia: Secondary | ICD-10-CM

## 2022-02-13 DIAGNOSIS — R252 Cramp and spasm: Secondary | ICD-10-CM

## 2022-02-13 DIAGNOSIS — M25512 Pain in left shoulder: Secondary | ICD-10-CM | POA: Diagnosis not present

## 2022-02-13 DIAGNOSIS — S46812A Strain of other muscles, fascia and tendons at shoulder and upper arm level, left arm, initial encounter: Secondary | ICD-10-CM | POA: Diagnosis not present
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
## 2022-02-20 ENCOUNTER — Ambulatory Visit: Payer: BC Managed Care – PPO | Admitting: Physical Therapy

## 2022-02-21 ENCOUNTER — Ambulatory Visit: Payer: BC Managed Care – PPO

## 2022-02-21 ENCOUNTER — Other Ambulatory Visit (HOSPITAL_COMMUNITY): Payer: Self-pay

## 2022-02-23 ENCOUNTER — Other Ambulatory Visit (HOSPITAL_COMMUNITY): Payer: Self-pay

## 2022-02-24 ENCOUNTER — Other Ambulatory Visit (HOSPITAL_COMMUNITY): Payer: Self-pay

## 2022-03-02 ENCOUNTER — Other Ambulatory Visit (HOSPITAL_COMMUNITY): Payer: Self-pay

## 2022-03-07 ENCOUNTER — Encounter: Payer: Self-pay | Admitting: Family Medicine

## 2022-03-10 ENCOUNTER — Other Ambulatory Visit (HOSPITAL_COMMUNITY): Payer: Self-pay

## 2022-03-12 ENCOUNTER — Other Ambulatory Visit (HOSPITAL_BASED_OUTPATIENT_CLINIC_OR_DEPARTMENT_OTHER): Payer: Self-pay

## 2022-04-03 ENCOUNTER — Other Ambulatory Visit: Payer: Self-pay | Admitting: Family Medicine

## 2022-04-03 DIAGNOSIS — B3731 Acute candidiasis of vulva and vagina: Secondary | ICD-10-CM

## 2022-04-09 ENCOUNTER — Ambulatory Visit: Payer: BC Managed Care – PPO | Admitting: Family Medicine

## 2022-04-22 ENCOUNTER — Other Ambulatory Visit: Payer: Self-pay | Admitting: Family Medicine

## 2022-04-22 DIAGNOSIS — B3731 Acute candidiasis of vulva and vagina: Secondary | ICD-10-CM

## 2022-04-26 ENCOUNTER — Other Ambulatory Visit: Payer: Self-pay | Admitting: Family Medicine

## 2022-04-26 ENCOUNTER — Other Ambulatory Visit (HOSPITAL_COMMUNITY): Payer: Self-pay

## 2022-04-26 DIAGNOSIS — E118 Type 2 diabetes mellitus with unspecified complications: Secondary | ICD-10-CM

## 2022-04-26 MED ORDER — OZEMPIC (2 MG/DOSE) 8 MG/3ML ~~LOC~~ SOPN
2.0000 mg | PEN_INJECTOR | SUBCUTANEOUS | 0 refills | Status: DC
  Filled 2022-04-26 – 2022-05-02 (×2): qty 6, 56d supply, fill #0
  Filled 2022-05-12: qty 3, 28d supply, fill #0
  Filled 2022-06-08: qty 3, 28d supply, fill #1

## 2022-04-27 ENCOUNTER — Other Ambulatory Visit (HOSPITAL_COMMUNITY): Payer: Self-pay

## 2022-05-02 ENCOUNTER — Other Ambulatory Visit (HOSPITAL_COMMUNITY): Payer: Self-pay

## 2022-05-02 ENCOUNTER — Encounter: Payer: Self-pay | Admitting: Family Medicine

## 2022-05-02 ENCOUNTER — Ambulatory Visit (INDEPENDENT_AMBULATORY_CARE_PROVIDER_SITE_OTHER): Payer: BC Managed Care – PPO | Admitting: Family Medicine

## 2022-05-02 VITALS — BP 133/80 | HR 84 | Ht 64.0 in | Wt 197.0 lb

## 2022-05-02 DIAGNOSIS — F419 Anxiety disorder, unspecified: Secondary | ICD-10-CM | POA: Diagnosis not present

## 2022-05-02 DIAGNOSIS — E118 Type 2 diabetes mellitus with unspecified complications: Secondary | ICD-10-CM | POA: Diagnosis not present

## 2022-05-02 DIAGNOSIS — J301 Allergic rhinitis due to pollen: Secondary | ICD-10-CM | POA: Diagnosis not present

## 2022-05-02 DIAGNOSIS — B3731 Acute candidiasis of vulva and vagina: Secondary | ICD-10-CM

## 2022-05-02 DIAGNOSIS — F32A Depression, unspecified: Secondary | ICD-10-CM | POA: Diagnosis not present

## 2022-05-02 LAB — POCT UA - MICROALBUMIN
Creatinine, POC: 50 mg/dL
Microalbumin Ur, POC: 30 mg/L

## 2022-05-02 MED ORDER — LEVOCETIRIZINE DIHYDROCHLORIDE 5 MG PO TABS: 5.0000 mg | ORAL_TABLET | Freq: Every evening | ORAL | 3 refills | Status: DC

## 2022-05-02 MED ORDER — FLUCONAZOLE 200 MG PO TABS: ORAL_TABLET | ORAL | 3 refills | Status: DC

## 2022-05-02 NOTE — Assessment & Plan Note (Signed)
Due for foot exam and urine microalbumin.

## 2022-05-02 NOTE — Progress Notes (Signed)
Established Patient Office Visit  Subjective   Patient ID: Jamie Mccarthy, female    DOB: Apr 03, 1975  Age: 47 y.o. MRN: [id number]  No chief complaint on file.   HPI Follow-up anxiety/depression-we increased the Effexor to 75 mg in January.  She is here today for follow-up. Father passed away this fall.  She does feel like the medication has been helpful she really feels like it is helped with her irritability.  PHQ-9 score of 11 and GAD-7 score of 10 today.  She would like to continue with the medication for a little while longer.  She does have another 90-day refill on file.  Diabetes - no hypoglycemic events. No wounds or sores that are not healing well. No increased thirst or urination. Checking glucose at home. Taking medications as prescribed without any side effects.    ROS    Objective:     BP 133/80   Pulse 84   Ht 5\' 4"  (1.626 m)   Wt 197 lb (89.4 kg)   LMP 08/29/2015 (Approximate)   SpO2 98%   BMI 33.81 kg/m    Physical Exam Vitals and nursing note reviewed.  Constitutional:      Appearance: She is well-developed.  HENT:     Head: Normocephalic and atraumatic.  Cardiovascular:     Rate and Rhythm: Normal rate and regular rhythm.     Heart sounds: Normal heart sounds.  Pulmonary:     Effort: Pulmonary effort is normal.     Breath sounds: Normal breath sounds.  Skin:    General: Skin is warm and dry.  Neurological:     Mental Status: She is alert and oriented to person, place, and time.  Psychiatric:        Behavior: Behavior normal.      Results for orders placed or performed in visit on 05/02/22  POCT UA - Microalbumin  Result Value Ref Range   Microalbumin Ur, POC 30 mg/L   Creatinine, POC 50 mg/dL   Albumin/Creatinine Ratio, Urine, POC 30-300       The 10-year ASCVD risk score (Arnett DK, et al., 2019) is: 2.2%    Assessment & Plan:   Problem List Items Addressed This Visit       Endocrine   Controlled type 2 diabetes mellitus  with complication, without long-term current use of insulin - Primary    Due for foot exam and urine microalbumin.       Relevant Orders   POCT UA - Microalbumin (Completed)     Other   Anxiety and depression    Feels like overall she is doing well with her current regimen she still having some significant symptoms but feels happy with her current dose and would like to stick with it.  Will continue Effexor 75 mg and then plan to follow-up in 3 months.      Other Visit Diagnoses     Vaginal candidiasis       Relevant Medications   fluconazole (DIFLUCAN) 200 MG tablet   Seasonal allergic rhinitis due to pollen       Relevant Medications   levocetirizine (XYZAL) 5 MG tablet      Also like a refill on the fluconazole she gets frequent vaginal yeast infections and even though her A1c is well-controlled.  Did refill medications today.   Encouraged her to schedule her mammogram as well she can schedule through MyChart or go downstairs today and go ahead and schedule then.  Return in about  15 weeks (around 08/15/2022) for Mood and A1C .    Nani Gasser, MD

## 2022-05-02 NOTE — Progress Notes (Signed)
Recheck in 3-4 months 

## 2022-05-02 NOTE — Assessment & Plan Note (Signed)
Feels like overall she is doing well with her current regimen she still having some significant symptoms but feels happy with her current dose and would like to stick with it.  Will continue Effexor 75 mg and then plan to follow-up in 3 months.

## 2022-05-07 ENCOUNTER — Encounter: Payer: Self-pay | Admitting: *Deleted

## 2022-05-11 ENCOUNTER — Other Ambulatory Visit (HOSPITAL_COMMUNITY): Payer: Self-pay

## 2022-05-12 ENCOUNTER — Other Ambulatory Visit: Payer: Self-pay | Admitting: Family Medicine

## 2022-05-12 ENCOUNTER — Other Ambulatory Visit (HOSPITAL_COMMUNITY): Payer: Self-pay

## 2022-05-12 DIAGNOSIS — G43009 Migraine without aura, not intractable, without status migrainosus: Secondary | ICD-10-CM

## 2022-05-14 ENCOUNTER — Other Ambulatory Visit (HOSPITAL_COMMUNITY): Payer: Self-pay

## 2022-05-14 ENCOUNTER — Other Ambulatory Visit: Payer: Self-pay

## 2022-05-14 ENCOUNTER — Emergency Department (HOSPITAL_COMMUNITY): Payer: BC Managed Care – PPO

## 2022-05-14 ENCOUNTER — Emergency Department (HOSPITAL_COMMUNITY)
Admission: EM | Admit: 2022-05-14 | Discharge: 2022-05-14 | Disposition: A | Discharge status: Home / Self Care | Payer: BC Managed Care – PPO | Attending: Emergency Medicine | Admitting: Emergency Medicine

## 2022-05-14 ENCOUNTER — Encounter (HOSPITAL_COMMUNITY): Payer: Self-pay

## 2022-05-14 DIAGNOSIS — W01198A Fall on same level from slipping, tripping and stumbling with subsequent striking against other object, initial encounter: Secondary | ICD-10-CM | POA: Insufficient documentation

## 2022-05-14 DIAGNOSIS — R55 Syncope and collapse: Secondary | ICD-10-CM | POA: Diagnosis not present

## 2022-05-14 DIAGNOSIS — S0003XA Contusion of scalp, initial encounter: Secondary | ICD-10-CM | POA: Diagnosis not present

## 2022-05-14 DIAGNOSIS — S0990XA Unspecified injury of head, initial encounter: Secondary | ICD-10-CM

## 2022-05-14 DIAGNOSIS — Z91040 Latex allergy status: Secondary | ICD-10-CM | POA: Insufficient documentation

## 2022-05-14 LAB — CBC WITH DIFFERENTIAL/PLATELET
Abs Immature Granulocytes: 0.02 10*3/uL (ref 0.00–0.07)
Basophils Absolute: 0 10*3/uL (ref 0.0–0.1)
Basophils Relative: 0 %
Eosinophils Absolute: 0.1 10*3/uL (ref 0.0–0.5)
Eosinophils Relative: 1 %
HCT: 41.8 % (ref 36.0–46.0)
Hemoglobin: 13.9 g/dL (ref 12.0–15.0)
Immature Granulocytes: 0 %
Lymphocytes Relative: 27 %
Lymphs Abs: 1.8 10*3/uL (ref 0.7–4.0)
MCH: 27.5 pg (ref 26.0–34.0)
MCHC: 33.3 g/dL (ref 30.0–36.0)
MCV: 82.8 fL (ref 80.0–100.0)
Monocytes Absolute: 0.4 10*3/uL (ref 0.1–1.0)
Monocytes Relative: 6 %
Neutro Abs: 4.2 10*3/uL (ref 1.7–7.7)
Neutrophils Relative %: 66 %
Platelets: 276 10*3/uL (ref 150–400)
RBC: 5.05 MIL/uL (ref 3.87–5.11)
RDW: 13.2 % (ref 11.5–15.5)
WBC: 6.4 10*3/uL (ref 4.0–10.5)
nRBC: 0 % (ref 0.0–0.2)

## 2022-05-14 LAB — BASIC METABOLIC PANEL
Anion gap: 8 (ref 5–15)
BUN: 7 mg/dL (ref 6–20)
CO2: 26 mmol/L (ref 22–32)
Calcium: 9 mg/dL (ref 8.9–10.3)
Chloride: 105 mmol/L (ref 98–111)
Creatinine, Ser: 0.61 mg/dL (ref 0.44–1.00)
GFR, Estimated: >60 mL/min (ref >60)
Glucose, Bld: 96 mg/dL (ref 70–99)
Potassium: 3.7 mmol/L (ref 3.5–5.1)
Sodium: 139 mmol/L (ref 135–145)

## 2022-05-14 LAB — TROPONIN I (HIGH SENSITIVITY)
Troponin I (High Sensitivity): 3 ng/L (ref <18)
Troponin I (High Sensitivity): 4 ng/L (ref <18)

## 2022-05-14 LAB — I-STAT BETA HCG BLOOD, ED (MC, WL, AP ONLY): I-stat hCG, quantitative: <5 m[IU]/mL (ref <5)

## 2022-05-14 LAB — CBG MONITORING, ED: Glucose-Capillary: 96 mg/dL (ref 70–99)

## 2022-05-14 MED ORDER — SODIUM CHLORIDE 0.9 % IV BOLUS
1000.0000 mL | Freq: Once | INTRAVENOUS | Status: AC
  Administered 2022-05-14: 1000 mL via INTRAVENOUS

## 2022-05-14 MED ORDER — ACETAMINOPHEN 500 MG PO TABS
1000.0000 mg | ORAL_TABLET | Freq: Once | ORAL | Status: AC
  Administered 2022-05-14: 1000 mg via ORAL
  Filled 2022-05-14: qty 2

## 2022-05-14 MED FILL — Erenumab-aooe Subcutaneous Soln Auto-Injector 70 MG/ML: SUBCUTANEOUS | 30 days supply | Qty: 1 | Fill #0 | Status: CN

## 2022-05-14 NOTE — ED Provider Notes (Signed)
[place] EMERGENCY DEPARTMENT AT [facility] Provider Note   CSN: [id number] Arrival date & time: 05/14/22  4098     History  Chief Complaint  Patient presents with   Loss of Consciousness    Jamie Mccarthy is a 47 y.o. female.  47 year old female with prior medical history as detailed below presents for evaluation.  Patient reports that this morning she was in her normal state of health.  While having a come conversation with her mother she suddenly felt dizzy.  This was associated with feeling lightheaded.  She attempted to get onto the floor but apparently passed out before this could be done.  She did fall and strike the back of her head.  She has small abrasion to the posterior scalp with minimal bleeding.  She denies associated chest pain, shortness of breath, nausea, vomiting.  She reports feeling improved upon arrival to the ED.  She reports history of mild vertigo.  She denies significant vertiginous symptoms prior to the episode of syncope.  She reports that she probably lost consciousness for several seconds at most.  The history is provided by the patient and medical records.       Home Medications Prior to Admission medications   Medication Sig Start Date End Date Taking? Authorizing Provider  B Complex-C (B-COMPLEX WITH VITAMIN C) tablet Take 1 tablet by mouth daily. 12/25/21   Agapito Games, MD  Betamethasone Valerate 0.12 % foam Apply topically 2 times daily. To scalp 01/24/22   Agapito Games, MD  Clobetasol Propionate 0.05 % shampoo Wash scalp once or twice weekly with shampoo 12/26/21   Agapito Games, MD  cyclobenzaprine (FLEXERIL) 10 MG tablet Take 0.5-1 tablets (5-10 mg total) by mouth at bedtime as needed for muscle spasms. 02/06/22   Agapito Games, MD  Erenumab-aooe (AIMOVIG) 70 MG/ML SOAJ INJECT  INTO THE SKIN EVERY 30 DAYS 05/01/21   Agapito Games, MD  fluconazole (DIFLUCAN) 200 MG tablet TAKE 1 TABLET  EVERY 3 DAYS FOR 3 DOSES ( 6 DAY SPAN) THEN TAKE 1 TABLET BY MOUTH ONCE A WEEK 05/02/22   Agapito Games, MD  Fluocinolone Acetonide 0.01 % OIL Place 3 drops into both ears at bedtime. 12/26/21   Agapito Games, MD  ketoconazole (NIZORAL) 2 % cream Apply topically daily as needed for irritation. 12/25/21   Agapito Games, MD  Lancets Misc. KIT Contour Next lancets, patient preference. For testing blood sugars up to twice daily. Dx:E11.65 07/24/19   Tollie Eth, NP  levocetirizine (XYZAL) 5 MG tablet Take 1 tablet (5 mg total) by mouth every evening. 05/02/22   Agapito Games, MD  Semaglutide, 2 MG/DOSE, (OZEMPIC, 2 MG/DOSE,) 8 MG/3ML SOPN Inject 2 mg as directed once a week. 04/26/22   Agapito Games, MD  SUMAtriptan (IMITREX) 100 MG tablet MAY REPEAT IN 2 HOURS IF HEADACHE PERSISTS OR RECURS. 04/03/22   Agapito Games, MD  venlafaxine XR (EFFEXOR XR) 75 MG 24 hr capsule Take 1 capsule (75 mg total) by mouth daily with breakfast. 02/06/22   Agapito Games, MD  Vitamin D, Ergocalciferol, (DRISDOL) 1.25 MG (50000 UNIT) CAPS capsule TAKE 1 CAPSULE BY MOUTH ONE TIME PER WEEK 02/06/22   Agapito Games, MD      Allergies    Latex, Metformin and related, and Naproxen    Review of Systems   Review of Systems  Cardiovascular:  Positive for syncope.  All other systems reviewed and  are negative.   Physical Exam Updated Vital Signs BP (!) 128/94   Pulse 65   Temp 97.7 F (36.5 C) (Oral)   Resp 17   Ht 5\' 4"  (1.626 m)   Wt 88.5 kg   LMP 08/29/2015 (Approximate)   SpO2 100%   BMI 33.47 kg/m  Physical Exam Vitals and nursing note reviewed.  Constitutional:      General: She is not in acute distress.    Appearance: Normal appearance. She is well-developed.  HENT:     Head: Normocephalic.     Comments: Superficial abrasion and contusion noted to posterior scalp.  Minimal bleeding noted.  No injury appropriate for suturing or staple  placement. Eyes:     Conjunctiva/sclera: Conjunctivae normal.     Pupils: Pupils are equal, round, and reactive to light.  Cardiovascular:     Rate and Rhythm: Normal rate and regular rhythm.     Heart sounds: Normal heart sounds.  Pulmonary:     Effort: Pulmonary effort is normal. No respiratory distress.     Breath sounds: Normal breath sounds.  Abdominal:     General: There is no distension.     Palpations: Abdomen is soft.     Tenderness: There is no abdominal tenderness.  Musculoskeletal:        General: No deformity. Normal range of motion.     Cervical back: Normal range of motion and neck supple.  Skin:    General: Skin is warm and dry.  Neurological:     General: No focal deficit present.     Mental Status: She is alert and oriented to person, place, and time. Mental status is at baseline.     Cranial Nerves: No cranial nerve deficit.     Sensory: No sensory deficit.     Motor: No weakness.     Coordination: Coordination normal.     ED Results / Procedures / Treatments   Labs (all labs ordered are listed, but only abnormal results are displayed) Labs Reviewed  CBC WITH DIFFERENTIAL/PLATELET  BASIC METABOLIC PANEL  CBG MONITORING, ED  I-STAT BETA HCG BLOOD, ED ([facility], [facility], [facility] ONLY)  TROPONIN I (HIGH SENSITIVITY)  TROPONIN I (HIGH SENSITIVITY)    EKG EKG Interpretation  Date/Time:  Monday May 14 2022 08:44:25 EDT Ventricular Rate:  88 PR Interval:  144 QRS Duration: 73 QT Interval:  356 QTC Calculation: 431 R Axis:   54 Text Interpretation: Sinus rhythm Atrial premature complex Low voltage, precordial leads Borderline T abnormalities, anterior leads Confirmed by Kristine Royal [phone]) on 05/14/2022 9:40:25 AM  Radiology CT Head Wo Contrast  Result Date: 05/14/2022 CLINICAL DATA:  Syncope/presyncope, cerebrovascular cause suspected. Fall with head strike. EXAM: CT HEAD WITHOUT CONTRAST TECHNIQUE: Contiguous axial images were obtained from the base of the  skull through the vertex without intravenous contrast. RADIATION DOSE REDUCTION: This exam was performed according to the departmental dose-optimization program which includes automated exposure control, adjustment of the mA and/or kV according to patient size and/or use of iterative reconstruction technique. COMPARISON:  MRI brain 08/31/2019. FINDINGS: Brain: No acute intracranial hemorrhage. Gray-white differentiation is preserved. No hydrocephalus or extra-axial collection. No mass effect or midline shift. Vascular: No hyperdense vessel or unexpected calcification. Skull: No calvarial fracture or suspicious bone lesion. Skull base is unremarkable. Sinuses/Orbits: Unremarkable. Other: Left posterior scalp hematoma. IMPRESSION: 1. No evidence of acute intracranial injury. 2. Left posterior scalp hematoma without underlying calvarial fracture. Electronically Signed   By: Elwyn Reach.D.  On: 05/14/2022 11:46    Procedures Procedures    Medications Ordered in ED Medications  acetaminophen (TYLENOL) tablet 1,000 mg (1,000 mg Oral Given 05/14/22 1021)  sodium chloride 0.9 % bolus 1,000 mL (1,000 mLs Intravenous New Bag/Given 05/14/22 1222)    ED Course/ Medical Decision Making/ A&P                             Medical Decision Making Amount and/or Complexity of Data Reviewed Labs: ordered. Radiology: ordered.  Risk OTC drugs.    Medical Screen Complete  This patient presented to the ED with complaint of syncope, head injury.  This complaint involves an extensive number of treatment options. The initial differential diagnosis includes, but is not limited to, trauma related to head injury, metabolic abnormality, etc.  This presentation is: Acute, Self-Limited, Previously Undiagnosed, Uncertain Prognosis, Complicated, Systemic Symptoms, and Threat to Life/Bodily Function  Patient is presenting for evaluation after a brief syncopal event that happened at home.  Patient did suffer a  superficial scalp injury related to fall.  Injury did not require suturing or stapling.  Imaging is without significant abnormality.  Screening labs obtained are without significant abnormality.  Patient feels significantly improved after ED evaluation.  She desires discharge home.  Importance of close follow-up is stressed.  Strict return precautions given and understood.  Additional history obtained:  External records from outside sources obtained and reviewed including prior ED visits and prior Inpatient records.    Lab Tests:  I ordered and personally interpreted labs.  The pertinent results include: CBC, BMP, hCG, troponin x 2, CBG   Imaging Studies ordered:  I ordered imaging studies including CT head I independently visualized and interpreted obtained imaging which showed NAD I agree with the radiologist interpretation.   Cardiac Monitoring:  The patient was maintained on a cardiac monitor.  I personally viewed and interpreted the cardiac monitor which showed an underlying rhythm of: NSR   Medicines ordered:  I ordered medication including IV fluids, Tylenol for suspected dehydration, pain Reevaluation of the patient after these medicines showed that the patient: improved   Problem List / ED Course:  Syncope, head injury   Reevaluation:  After the interventions noted above, I reevaluated the patient and found that they have: improved  Disposition:  After consideration of the diagnostic results and the patients response to treatment, I feel that the patent would benefit from close outpatient follow-up.          Final Clinical Impression(s) / ED Diagnoses Final diagnoses:  Syncope, unspecified syncope type  Injury of head, initial encounter    Rx / DC Orders ED Discharge Orders     None         Wynetta Fines, MD 05/14/22 1534

## 2022-05-14 NOTE — Discharge Instructions (Signed)
Return for any problem.  ?

## 2022-05-14 NOTE — ED Triage Notes (Signed)
Patient said she was on the phone with her mom at 7:45am, felt dizzy and woke up on the floor with blood on the back of her head. Not taking a blood thinner.

## 2022-05-15 ENCOUNTER — Ambulatory Visit (INDEPENDENT_AMBULATORY_CARE_PROVIDER_SITE_OTHER): Payer: BC Managed Care – PPO | Admitting: Family Medicine

## 2022-05-15 VITALS — BP 129/83 | HR 88 | Ht 64.0 in | Wt 197.0 lb

## 2022-05-15 DIAGNOSIS — S0001XA Abrasion of scalp, initial encounter: Secondary | ICD-10-CM

## 2022-05-15 DIAGNOSIS — R55 Syncope and collapse: Secondary | ICD-10-CM | POA: Diagnosis not present

## 2022-05-15 DIAGNOSIS — S0990XA Unspecified injury of head, initial encounter: Secondary | ICD-10-CM

## 2022-05-15 NOTE — Progress Notes (Signed)
Acute Office Visit  Subjective:     Patient ID: Jamie Mccarthy, female    DOB: 06/04/1975, 47 y.o.   MRN: [id number]  Chief Complaint  Patient presents with   Follow-up    HPI Patient is in today for syncope that occurred yesterday morning while she was talking to her mom on the phone.  She was up cooking breakfast she had not eaten yet.  She did not feel dehydrated.  She had taken a cold remedy medication several days prior but not within 24 hours before this happened.  She had been having some loose stools over the weekend as her last Ozempic shot was on Friday.  When she fell she fell backwards and hit her head on a knob and then bruised her elbow.  They were unable to suture anything on her scalp.  He has had a headache since  Did feel a little lightheaded this morning and was able to sit down and she did not pass out.  She tries to do really well with keeping her fluids up.  ROS      Objective:    BP 129/83   Pulse 88   Ht  (1.626 m)   Wt 197 lb (89.4 kg)   LMP 08/29/2015 (Approximate)   SpO2 100%   BMI 33.81 kg/m    Physical Exam Vitals and nursing note reviewed.  Constitutional:      Appearance: She is well-developed.  HENT:     Head: Normocephalic and atraumatic.  Cardiovascular:     Rate and Rhythm: Normal rate and regular rhythm.     Heart sounds: Normal heart sounds.  Pulmonary:     Effort: Pulmonary effort is normal.     Breath sounds: Normal breath sounds.  Skin:    General: Skin is warm and dry.     Comments: Scalp lacerations posteriorly.  It is mostly an abrasion over the top layer of the skin is denuded.  So nothing that could be specifically sutured back together.  Neurological:     Mental Status: She is alert and oriented to person, place, and time.  Psychiatric:        Behavior: Behavior normal.     No results found for any visits on 05/15/22.      Assessment & Plan:   Problem List Items Addressed This Visit   None Visit  Diagnoses     Syncope, unspecified syncope type    -  Primary   Relevant Orders   ECHOCARDIOGRAM COMPLETE   Abrasion of scalp, initial encounter       Traumatic injury of head with severe headache         Syncope-unclear etiology.  Labs and EKG in the ED were normal normal.  She felt a little dizzy right before she passed out but does not member having any chest pain or palpitations yesterday morning.  We did discuss checking her blood pressure at home she does have a blood pressure cuff and she can do it daily for the next week and then if everything looks good she can do it every other day for the next week.  We also discussed possibly getting an echocardiogram as she is never had syncope before.  It may be that her volume status was down especially since she had some more frequent loose stools over the weekend.  Make sure staying well-hydrated and make sure you are eating regularly and not skipping meals.  Wound care-discussed okay to wash scalp  to remove some of the crusted blood at this point.  Just do not scrub at the wounds apply Vaseline once to twice daily for the next 10 days.  No orders of the defined types were placed in this encounter. ED note, labs, etc reviewed.    No follow-ups on file.  Nani Gasser, MD

## 2022-05-15 NOTE — Patient Instructions (Signed)
Check BP at home daily for one week and then every other day for one week. Your are welcome to send those via MyChart.

## 2022-05-17 ENCOUNTER — Other Ambulatory Visit: Payer: Self-pay | Admitting: Family Medicine

## 2022-05-17 DIAGNOSIS — G43009 Migraine without aura, not intractable, without status migrainosus: Secondary | ICD-10-CM

## 2022-05-18 ENCOUNTER — Other Ambulatory Visit (HOSPITAL_BASED_OUTPATIENT_CLINIC_OR_DEPARTMENT_OTHER): Payer: Self-pay

## 2022-05-22 ENCOUNTER — Ambulatory Visit: Payer: BC Managed Care – PPO | Admitting: Family Medicine

## 2022-05-23 ENCOUNTER — Other Ambulatory Visit (HOSPITAL_COMMUNITY): Payer: Self-pay

## 2022-05-24 ENCOUNTER — Encounter: Payer: Self-pay | Admitting: Family Medicine

## 2022-05-30 ENCOUNTER — Encounter: Payer: Self-pay | Admitting: Family Medicine

## 2022-05-30 DIAGNOSIS — R200 Anesthesia of skin: Secondary | ICD-10-CM

## 2022-05-30 DIAGNOSIS — R42 Dizziness and giddiness: Secondary | ICD-10-CM

## 2022-05-31 MED ORDER — PREDNISONE 20 MG PO TABS
40.0000 mg | ORAL_TABLET | Freq: Every day | ORAL | 0 refills | Status: DC
Start: 2022-05-31 — End: 2022-08-31

## 2022-05-31 NOTE — Telephone Encounter (Signed)
Meds ordered this encounter  Medications   predniSONE (DELTASONE) 20 MG tablet    Sig: Take 2 tablets (40 mg total) by mouth daily with breakfast.    Dispense:  10 tablet    Refill:  0    

## 2022-06-08 ENCOUNTER — Other Ambulatory Visit (HOSPITAL_COMMUNITY): Payer: Self-pay

## 2022-06-08 MED FILL — Erenumab-aooe Subcutaneous Soln Auto-Injector 70 MG/ML: SUBCUTANEOUS | 30 days supply | Qty: 1 | Fill #0 | Status: CN

## 2022-06-12 ENCOUNTER — Other Ambulatory Visit (HOSPITAL_COMMUNITY): Payer: Self-pay

## 2022-06-14 ENCOUNTER — Other Ambulatory Visit (HOSPITAL_COMMUNITY): Payer: Self-pay

## 2022-06-15 ENCOUNTER — Other Ambulatory Visit (HOSPITAL_COMMUNITY): Payer: Self-pay

## 2022-06-15 ENCOUNTER — Ambulatory Visit (HOSPITAL_COMMUNITY): Payer: BC Managed Care – PPO | Attending: Family Medicine

## 2022-06-15 DIAGNOSIS — R55 Syncope and collapse: Secondary | ICD-10-CM | POA: Insufficient documentation

## 2022-06-15 LAB — ECHOCARDIOGRAM COMPLETE
Area-P 1/2: 3.27 cm2
S' Lateral: 2.8 cm

## 2022-06-19 NOTE — Progress Notes (Signed)
Hi Kennya, pumping function of the heart looks good at 55 to 60%.  No abnormal wall motions.  The mitral valve looks good the aortic valve also looks good and is working well.  Overall the heart function looks great.

## 2022-06-20 ENCOUNTER — Other Ambulatory Visit (HOSPITAL_COMMUNITY): Payer: Self-pay

## 2022-06-29 NOTE — Telephone Encounter (Signed)
Orders Placed This Encounter  Procedures   MR Brain W Wo Contrast    Standing Status:   Future    Standing Expiration Date:   06/29/2023    Order Specific Question:   If indicated for the ordered procedure, I authorize the administration of contrast media per Radiology protocol    Answer:   Yes    Order Specific Question:   What is the patient's sedation requirement?    Answer:   No Sedation    Order Specific Question:   Does the patient have a pacemaker or implanted devices?    Answer:   No    Order Specific Question:   Preferred imaging location?    Answer:   [facility] (table limit-350lbs)

## 2022-06-29 NOTE — Addendum Note (Signed)
Addended by: Nani Gasser D on: 06/29/2022 05:10 PM   Modules accepted: Orders

## 2022-06-29 NOTE — Addendum Note (Signed)
Addended by: Nani Gasser D on: 06/29/2022 12:29 PM   Modules accepted: Orders

## 2022-06-30 ENCOUNTER — Encounter: Payer: Self-pay | Admitting: Family Medicine

## 2022-06-30 ENCOUNTER — Other Ambulatory Visit (HOSPITAL_COMMUNITY): Payer: Self-pay

## 2022-07-06 ENCOUNTER — Other Ambulatory Visit: Payer: Self-pay | Admitting: Family Medicine

## 2022-07-06 ENCOUNTER — Other Ambulatory Visit (HOSPITAL_COMMUNITY): Payer: Self-pay

## 2022-07-06 DIAGNOSIS — E118 Type 2 diabetes mellitus with unspecified complications: Secondary | ICD-10-CM

## 2022-07-06 MED ORDER — OZEMPIC (2 MG/DOSE) 8 MG/3ML ~~LOC~~ SOPN
2.0000 mg | PEN_INJECTOR | SUBCUTANEOUS | 0 refills | Status: DC
Start: 2022-07-06 — End: 2022-08-31
  Filled 2022-07-06: qty 3, 28d supply, fill #0
  Filled 2022-08-04: qty 3, 28d supply, fill #1

## 2022-07-06 MED FILL — Erenumab-aooe Subcutaneous Soln Auto-Injector 70 MG/ML: SUBCUTANEOUS | 30 days supply | Qty: 1 | Fill #0 | Status: CN

## 2022-07-09 ENCOUNTER — Other Ambulatory Visit (HOSPITAL_COMMUNITY): Payer: Self-pay

## 2022-07-12 ENCOUNTER — Other Ambulatory Visit (HOSPITAL_COMMUNITY): Payer: Self-pay

## 2022-07-23 ENCOUNTER — Other Ambulatory Visit: Payer: BC Managed Care – PPO

## 2022-07-24 ENCOUNTER — Encounter: Payer: Self-pay | Admitting: Family Medicine

## 2022-07-24 DIAGNOSIS — R42 Dizziness and giddiness: Secondary | ICD-10-CM

## 2022-07-24 NOTE — Telephone Encounter (Signed)
Ok for work note for this week 

## 2022-07-28 ENCOUNTER — Other Ambulatory Visit: Payer: BC Managed Care – PPO

## 2022-07-30 ENCOUNTER — Ambulatory Visit: Payer: BC Managed Care – PPO

## 2022-07-30 DIAGNOSIS — R200 Anesthesia of skin: Secondary | ICD-10-CM | POA: Diagnosis not present

## 2022-07-30 DIAGNOSIS — R42 Dizziness and giddiness: Secondary | ICD-10-CM

## 2022-07-30 DIAGNOSIS — R202 Paresthesia of skin: Secondary | ICD-10-CM | POA: Diagnosis not present

## 2022-07-30 MED ORDER — GADOBUTROL 1 MMOL/ML IV SOLN
9.0000 mL | Freq: Once | INTRAVENOUS | Status: AC | PRN
Start: 2022-07-30 — End: 2022-07-30
  Administered 2022-07-30: 9 mL via INTRAVENOUS

## 2022-07-31 ENCOUNTER — Encounter: Payer: Self-pay | Admitting: Family Medicine

## 2022-07-31 NOTE — Telephone Encounter (Signed)
Okay to go ahead and write her out for this week.  It looks like she did go for her MRI but it has not been read yet so I am hoping those results will be back in the next couple of days.  If she still having vertigo we can also consider vestibular treatment and rehab down the hall for that.

## 2022-08-03 ENCOUNTER — Other Ambulatory Visit (HOSPITAL_COMMUNITY): Payer: Self-pay

## 2022-08-06 ENCOUNTER — Encounter: Payer: Self-pay | Admitting: Family Medicine

## 2022-08-06 ENCOUNTER — Other Ambulatory Visit (HOSPITAL_COMMUNITY): Payer: Self-pay

## 2022-08-06 DIAGNOSIS — R42 Dizziness and giddiness: Secondary | ICD-10-CM

## 2022-08-06 DIAGNOSIS — R200 Anesthesia of skin: Secondary | ICD-10-CM

## 2022-08-06 NOTE — Progress Notes (Signed)
Hi Jamie Mccarthy, the MRI looks normal no abnormal findings very reassuring.  So I think neck step if you are still having your symptoms would be to refer you to neurology.  If you are okay with [place] placing that referral please let us know.  Also let us know if you have a preference for provider or location.

## 2022-08-07 ENCOUNTER — Other Ambulatory Visit: Payer: Self-pay | Admitting: Family Medicine

## 2022-08-07 ENCOUNTER — Encounter (HOSPITAL_COMMUNITY): Payer: Self-pay | Admitting: *Deleted

## 2022-08-07 DIAGNOSIS — F419 Anxiety disorder, unspecified: Secondary | ICD-10-CM

## 2022-08-07 DIAGNOSIS — R232 Flushing: Secondary | ICD-10-CM

## 2022-08-07 DIAGNOSIS — F4321 Adjustment disorder with depressed mood: Secondary | ICD-10-CM

## 2022-08-10 ENCOUNTER — Ambulatory Visit: Payer: BC Managed Care – PPO | Admitting: Physical Therapy

## 2022-08-12 MED FILL — Erenumab-aooe Subcutaneous Soln Auto-Injector 70 MG/ML: SUBCUTANEOUS | 30 days supply | Qty: 1 | Fill #0 | Status: CN

## 2022-08-13 ENCOUNTER — Other Ambulatory Visit (HOSPITAL_COMMUNITY): Payer: Self-pay

## 2022-08-13 ENCOUNTER — Other Ambulatory Visit: Payer: Self-pay

## 2022-08-17 ENCOUNTER — Ambulatory Visit: Payer: BC Managed Care – PPO | Admitting: Family Medicine

## 2022-08-24 ENCOUNTER — Other Ambulatory Visit: Payer: Self-pay

## 2022-08-24 DIAGNOSIS — E569 Vitamin deficiency, unspecified: Secondary | ICD-10-CM | POA: Diagnosis not present

## 2022-08-24 DIAGNOSIS — Z1321 Encounter for screening for nutritional disorder: Secondary | ICD-10-CM | POA: Diagnosis not present

## 2022-08-24 DIAGNOSIS — R5383 Other fatigue: Secondary | ICD-10-CM | POA: Diagnosis not present

## 2022-08-24 DIAGNOSIS — Z9884 Bariatric surgery status: Secondary | ICD-10-CM | POA: Diagnosis not present

## 2022-08-31 ENCOUNTER — Ambulatory Visit (INDEPENDENT_AMBULATORY_CARE_PROVIDER_SITE_OTHER): Payer: BC Managed Care – PPO | Admitting: Family Medicine

## 2022-08-31 ENCOUNTER — Other Ambulatory Visit (HOSPITAL_COMMUNITY): Payer: Self-pay

## 2022-08-31 ENCOUNTER — Encounter: Payer: Self-pay | Admitting: Family Medicine

## 2022-08-31 ENCOUNTER — Other Ambulatory Visit: Payer: Self-pay | Admitting: Family Medicine

## 2022-08-31 VITALS — BP 112/66 | HR 94 | Ht 64.0 in | Wt 199.0 lb

## 2022-08-31 DIAGNOSIS — F32A Depression, unspecified: Secondary | ICD-10-CM

## 2022-08-31 DIAGNOSIS — F419 Anxiety disorder, unspecified: Secondary | ICD-10-CM

## 2022-08-31 DIAGNOSIS — R21 Rash and other nonspecific skin eruption: Secondary | ICD-10-CM

## 2022-08-31 DIAGNOSIS — G43009 Migraine without aura, not intractable, without status migrainosus: Secondary | ICD-10-CM | POA: Diagnosis not present

## 2022-08-31 DIAGNOSIS — Z7985 Long-term (current) use of injectable non-insulin antidiabetic drugs: Secondary | ICD-10-CM

## 2022-08-31 DIAGNOSIS — E118 Type 2 diabetes mellitus with unspecified complications: Secondary | ICD-10-CM | POA: Diagnosis not present

## 2022-08-31 DIAGNOSIS — L219 Seborrheic dermatitis, unspecified: Secondary | ICD-10-CM

## 2022-08-31 DIAGNOSIS — M948X9 Other specified disorders of cartilage, unspecified sites: Secondary | ICD-10-CM

## 2022-08-31 DIAGNOSIS — Z23 Encounter for immunization: Secondary | ICD-10-CM | POA: Diagnosis not present

## 2022-08-31 LAB — POCT GLYCOSYLATED HEMOGLOBIN (HGB A1C): Hemoglobin A1C: 5.7 % — AB (ref 4.0–5.6)

## 2022-08-31 MED ORDER — AIMOVIG 70 MG/ML ~~LOC~~ SOAJ
70.0000 mg | SUBCUTANEOUS | 6 refills | Status: DC
Start: 2022-08-31 — End: 2023-02-06
  Filled 2022-08-31 – 2023-01-22 (×6): qty 1, 30d supply, fill #0

## 2022-08-31 MED ORDER — DOXYCYCLINE HYCLATE 100 MG PO TABS: 100.0000 mg | ORAL_TABLET | Freq: Two times a day (BID) | ORAL | 0 refills | Status: DC

## 2022-08-31 MED ORDER — OZEMPIC (2 MG/DOSE) 8 MG/3ML ~~LOC~~ SOPN
2.0000 mg | PEN_INJECTOR | SUBCUTANEOUS | 1 refills | Status: DC
Start: 2022-08-31 — End: 2023-01-22
  Filled 2022-08-31: qty 3, 28d supply, fill #0
  Filled 2022-09-29: qty 3, 28d supply, fill #1
  Filled 2022-11-10: qty 3, 28d supply, fill #2
  Filled 2022-11-30: qty 3, 28d supply, fill #3

## 2022-08-31 NOTE — Assessment & Plan Note (Signed)
Had some increase in depression and anxiety symptoms as she still says she is struggling at times with the loss of her father but feels like overall she is doing okay.  Did encourage her to consider therapy/counseling and to let me know if at any point she feels like that would be helpful.

## 2022-08-31 NOTE — Assessment & Plan Note (Signed)
He is eating better she is increased her water intake and cut back on alcohol.  A1c does look a little better now.  She says that her goal is to try to get off about 20 pounds.  She is following with the bariatric surgeon as well at [facility] surgery and they did do some blood work recently.

## 2022-08-31 NOTE — Progress Notes (Signed)
Pt reports that she has colonoscopy scheduled for 11/02/2022

## 2022-08-31 NOTE — Progress Notes (Signed)
Established Patient Office Visit  Subjective   Patient ID: Jamie Mccarthy, female    DOB: 01-Oct-1975  Age: 47 y.o. MRN: [id number]  Chief Complaint  Patient presents with   Diabetes    HPI Diabetes - no hypoglycemic events. No wounds or sores that are not healing well. No increased thirst or urination. Checking glucose at home. Taking medications as prescribed without any side effects.  Follow-up anxiety/depression-struggling with the loss of her dad recently.  Migraine headaches-awaiting to get her Aimovig approved. Has been sinc eApril.    She also has some irritation around the top of the right ear she had had a fairly new piercing in the area about a month ago and it has still stayed really red and inflamed ever since then.  She really does not want to have to take the piercing out but will if she has to.    ROS    Objective:     BP 112/66   Pulse 94   Ht 5\' 4"  (1.626 m)   Wt 199 lb (90.3 kg)   LMP 08/29/2015 (Approximate)   SpO2 100%   BMI 34.16 kg/m    Physical Exam Vitals and nursing note reviewed.  Constitutional:      Appearance: She is well-developed.  HENT:     Head: Normocephalic and atraumatic.  Cardiovascular:     Rate and Rhythm: Normal rate and regular rhythm.     Heart sounds: Normal heart sounds.  Pulmonary:     Effort: Pulmonary effort is normal.     Breath sounds: Normal breath sounds.  Skin:    General: Skin is warm and dry.     Comments: Have some erythema right around the top piercing at the top of her right ear.  It is also little tender.  No active drainage or pus.  Neurological:     Mental Status: She is alert and oriented to person, place, and time.  Psychiatric:        Behavior: Behavior normal.      Results for orders placed or performed in visit on 08/31/22  POCT glycosylated hemoglobin (Hb A1C)  Result Value Ref Range   Hemoglobin A1C 5.7 (A) 4.0 - 5.6 %   HbA1c POC (<> result, manual entry)     HbA1c, POC (prediabetic  range)     HbA1c, POC (controlled diabetic range)        The 10-year ASCVD risk score (Arnett DK, et al., 2019) is: 1.1%    Assessment & Plan:   Problem List Items Addressed This Visit       Cardiovascular and Mediastinum   Migraine headache without aura    Will try to resend the Aimovig.  I cannot see any notes in the chart that it was authorized.  Per the patient it looks like it still pending.  So I do not know if there is a hanging up or an issue but we will just going to start her liver again and start a new prescription.  If not covered we could consider something like Nurtec.      Relevant Medications   Erenumab-aooe (AIMOVIG) 70 MG/ML SOAJ     Endocrine   Controlled type 2 diabetes mellitus with complication, without long-term current use of insulin (HCC) - Primary    He is eating better she is increased her water intake and cut back on alcohol.  A1c does look a little better now.  She says that her goal is to try  to get off about 20 pounds.  She is following with the bariatric surgeon as well at [facility] surgery and they did do some blood work recently.      Relevant Medications   Semaglutide, 2 MG/DOSE, (OZEMPIC, 2 MG/DOSE,) 8 MG/3ML SOPN   Other Relevant Orders   POCT glycosylated hemoglobin (Hb A1C) (Completed)     Musculoskeletal and Integument   Seborrheic dermatitis of scalp   Relevant Orders   Ambulatory referral to Dermatology     Other   Anxiety and depression    Had some increase in depression and anxiety symptoms as she still says she is struggling at times with the loss of her father but feels like overall she is doing okay.  Did encourage her to consider therapy/counseling and to let me know if at any point she feels like that would be helpful.      Other Visit Diagnoses     Encounter for immunization       Relevant Orders   Tdap vaccine greater than or equal to 7yo IM (Completed)   Rash of face       Relevant Orders   Ambulatory  referral to Dermatology   Infection of cartilage          Rash on her face is possibly consistent with rosacea versus seborrheic dermatitis.  Would like to do a dermatology referral.  Flaking in the eyebrow area and on the scalp is also most consistent with seborrheic dermatitis.  She did get some response to the topicals but then it always seems to come back pretty quickly.  Return in about 4 months (around 12/31/2022) for Diabetes follow-up.    Nani Gasser, MD

## 2022-08-31 NOTE — Assessment & Plan Note (Signed)
Will try to resend the Aimovig.  I cannot see any notes in the chart that it was authorized.  Per the patient it looks like it still pending.  So I do not know if there is a hanging up or an issue but we will just going to start her liver again and start a new prescription.  If not covered we could consider something like Nurtec.

## 2022-09-03 ENCOUNTER — Encounter: Payer: Self-pay | Admitting: Family Medicine

## 2022-09-04 ENCOUNTER — Other Ambulatory Visit (HOSPITAL_COMMUNITY): Payer: Self-pay

## 2022-09-04 DIAGNOSIS — R519 Headache, unspecified: Secondary | ICD-10-CM | POA: Diagnosis not present

## 2022-09-04 DIAGNOSIS — J029 Acute pharyngitis, unspecified: Secondary | ICD-10-CM | POA: Diagnosis not present

## 2022-09-04 DIAGNOSIS — J069 Acute upper respiratory infection, unspecified: Secondary | ICD-10-CM | POA: Diagnosis not present

## 2022-09-27 ENCOUNTER — Encounter: Payer: Self-pay | Admitting: Family Medicine

## 2022-09-28 DIAGNOSIS — N951 Menopausal and female climacteric states: Secondary | ICD-10-CM | POA: Diagnosis not present

## 2022-09-28 DIAGNOSIS — Z1231 Encounter for screening mammogram for malignant neoplasm of breast: Secondary | ICD-10-CM | POA: Diagnosis not present

## 2022-09-28 DIAGNOSIS — Z01411 Encounter for gynecological examination (general) (routine) with abnormal findings: Secondary | ICD-10-CM | POA: Diagnosis not present

## 2022-09-29 ENCOUNTER — Other Ambulatory Visit (HOSPITAL_COMMUNITY): Payer: Self-pay

## 2022-10-05 ENCOUNTER — Encounter: Payer: Self-pay | Admitting: Neurology

## 2022-10-05 ENCOUNTER — Ambulatory Visit (INDEPENDENT_AMBULATORY_CARE_PROVIDER_SITE_OTHER): Payer: BC Managed Care – PPO | Admitting: Neurology

## 2022-10-05 VITALS — BP 130/85 | HR 100 | Ht 64.0 in | Wt 196.5 lb

## 2022-10-05 DIAGNOSIS — R55 Syncope and collapse: Secondary | ICD-10-CM | POA: Diagnosis not present

## 2022-10-05 DIAGNOSIS — G43709 Chronic migraine without aura, not intractable, without status migrainosus: Secondary | ICD-10-CM

## 2022-10-05 MED ORDER — RIZATRIPTAN BENZOATE 10 MG PO TBDP
10.0000 mg | ORAL_TABLET | ORAL | 6 refills | Status: DC | PRN
Start: 2022-10-05 — End: 2023-09-03

## 2022-10-05 NOTE — Progress Notes (Signed)
Chief Complaint  Patient presents with   New Patient (Initial Visit)    Rm14, alone, NP internal referral for Dizziness, facial numbness right sided starts at temporal and goes across bottom lip      ASSESSMENT AND PLAN  Jamie Mccarthy is a 47 y.o. female   Passing out spells  Most likely syncope, related to her rapid weight loss, possible diabetic small fiber neuropathy, emphasized importance of increased water intake, moderate exercise to build up endurance, tight control of her diabetes, low iron store, may benefit iron supplement, Chronic migraine headaches  Maxalt as needed  DIAGNOSTIC DATA (LABS, IMAGING, TESTING) - I reviewed patient records, labs, notes, testing and imaging myself where available.   MEDICAL HISTORY:  Jamie Mccarthy is a 65 old female, seen in request by her primary care from [facility] Dr. Nani Gasser for evaluation of passing out spells, initial evaluation October 05, 2022  I reviewed and summarized the referring note.PMHX DM since 2021,  Chronic migraine Anxiety Bariatric surgery in 2021, lost 60 LB  She had a history of bariatric surgery in 2021 was more than 60 pound weight loss, diabetes, intermittent bilateral feet numbness tingling, over the past few months, she also complains some dizziness lightheadedness when getting up from seated position, or with sudden positional change,  She fell few in April 2024 with transient loss of consciousness, getting up in the morning around 7 AM, trying to cook breakfast for her family at the kitchen, talking with her mother on the phone, she felt dizzy, then fell to the floor, as soon as she is hit the floor, she came to,  She denies a history of seizure,  She has chronic migraine headaches, happen 3-4 times each months  Personally reviewed CT head in April, MRI of the brain in July 2024, that was normal  Laboratory in 2024,Normal BMP, CBC, history of iron deficiency anemia in the  past, ferritin in August 2024 was 15, hemoglobin 13.1, there was no orthostatic hypotension on today's examination  PHYSICAL EXAM:   Vitals:   10/05/22 0811  BP: 130/85  Pulse: 100  Weight: 196 lb 8 oz (89.1 kg)  Height: 5\' 4"  (1.626 m)  BP sitting down 117/82, HR 75,  Standing up 121/80, HR 78  Body mass index is 33.73 kg/m.  PHYSICAL EXAMNIATION:  Gen: NAD, conversant, well nourised, well groomed                     Cardiovascular: Regular rate rhythm, no peripheral edema, warm, nontender. Eyes: Conjunctivae clear without exudates or hemorrhage Neck: Supple, no carotid bruits. Pulmonary: Clear to auscultation bilaterally   NEUROLOGICAL EXAM:  MENTAL STATUS: Speech/cognition: Awake, alert, oriented to history taking and casual conversation CRANIAL NERVES: CN II: Visual fields are full to confrontation. Pupils are round equal and briskly reactive to light. CN III, IV, VI: extraocular movement are normal. No ptosis. CN V: Facial sensation is intact to light touch CN VII: Face is symmetric with normal eye closure  CN VIII: Hearing is normal to causal conversation. CN IX, X: Phonation is normal. CN XI: Head turning and shoulder shrug are intact  MOTOR: There is no pronator drift of out-stretched arms. Muscle bulk and tone are normal. Muscle strength is normal.  REFLEXES: Reflexes are 2+ and symmetric at the biceps, triceps, knees, and ankles. Plantar responses are flexor.  SENSORY: Intact to light touch, pinprick and vibratory sensation are intact in fingers and toes.  COORDINATION:  There is no trunk or limb dysmetria noted.  GAIT/STANCE: Posture is normal. Gait is steady with normal steps, base, arm swing, and turning. Heel and toe walking are normal. Tandem gait is normal.  Romberg is absent.  REVIEW OF SYSTEMS:  Full 14 system review of systems performed and notable only for as above All other review of systems were negative.   ALLERGIES: Allergies   Allergen Reactions   Latex Itching    Pt states she gets severely itchy with latex   Metformin And Related Diarrhea   Naproxen Rash    HOME MEDICATIONS: Current Outpatient Medications  Medication Sig Dispense Refill   B Complex-C (B-COMPLEX WITH VITAMIN C) tablet Take 1 tablet by mouth daily. 90 tablet 3   Betamethasone Valerate 0.12 % foam Apply topically 2 times daily. To scalp 100 g 0   Clobetasol Propionate 0.05 % shampoo Wash scalp once or twice weekly with shampoo 118 mL 3   Erenumab-aooe (AIMOVIG) 70 MG/ML SOAJ Inject 70 mg into the skin every 30 days 1 mL 6   fluconazole (DIFLUCAN) 200 MG tablet TAKE 1 TABLET EVERY 3 DAYS FOR 3 DOSES ( 6 DAY SPAN) THEN TAKE 1 TABLET BY MOUTH ONCE A WEEK 6 tablet 3   Fluocinolone Acetonide 0.01 % OIL Place 3 drops into both ears at bedtime. 20 mL 1   ketoconazole (NIZORAL) 2 % cream Apply topically daily as needed for irritation. 15 g 0   Lancets Misc. KIT Contour Next lancets, patient preference. For testing blood sugars up to twice daily. Dx:E11.65 1 kit 0   levocetirizine (XYZAL) 5 MG tablet Take 1 tablet (5 mg total) by mouth every evening. 90 tablet 3   Semaglutide, 2 MG/DOSE, (OZEMPIC, 2 MG/DOSE,) 8 MG/3ML SOPN Inject 2 mg as directed once a week. 6 mL 1   SUMAtriptan (IMITREX) 100 MG tablet MAY REPEAT IN 2 HOURS IF HEADACHE PERSISTS OR RECURS. 10 tablet 2   venlafaxine XR (EFFEXOR-XR) 75 MG 24 hr capsule TAKE 1 CAPSULE BY MOUTH DAILY WITH BREAKFAST. 90 capsule 1   Vitamin D, Ergocalciferol, (DRISDOL) 1.25 MG (50000 UNIT) CAPS capsule TAKE 1 CAPSULE BY MOUTH ONE TIME PER WEEK 12 capsule 6   No current facility-administered medications for this visit.    PAST MEDICAL HISTORY: Past Medical History:  Diagnosis Date   Anemia    Has sickle cell trait   Anxiety    Depression    GERD (gastroesophageal reflux disease)    diet controlled   Gestational diabetes    H/O vaginal delivery 2010   Headache(784.0)    Migraines   Herpes 2007    hx of   Iron deficiency anemia, unspecified 10/20/2012   Neuromuscular disorder (HCC)    Obese    PONV (postoperative nausea and vomiting)    has had PONV with previous c/s and also lap band surgery 2004   Raynaud's disease    Seasonal allergies     PAST SURGICAL HISTORY: Past Surgical History:  Procedure Laterality Date   BILATERAL SALPINGECTOMY  07/16/2012   Procedure: BILATERAL DISTAL SALPINGECTOMY;  Surgeon: Oliver Pila, MD;  Location: [facility];  Service: Obstetrics;;   CESAREAN SECTION  01/22/2010   CESAREAN SECTION N/A 07/16/2012   Procedure: REPEAT CESAREAN SECTION;  Surgeon: Oliver Pila, MD;  Location: [facility];  Service: Obstetrics;  Laterality: N/A;   CHOLECYSTECTOMY N/A 11/14/2012   Procedure: LAPAROSCOPIC CHOLECYSTECTOMY;  Surgeon: Atilano Ina, MD;  Location: [facility];  Service: General;  Laterality:  N/A;   GASTRIC BANDING PORT REVISION N/A 11/14/2012   Procedure: GASTRIC BANDING PORT REVISION;  Surgeon: Atilano Ina, MD;  Location: [facility];  Service: General;  Laterality: N/A;  PORT PLACEMENT MOVED NEW MESH INSERTED    GASTRIC ROUX-EN-Y N/A 12/21/2019   Procedure: LAPAROSCOPIC ROUX-EN-Y GASTRIC BYPASS;  Surgeon: Gaynelle Adu, MD;  Location: [facility];  Service: General;  Laterality: N/A;   LAPAROSCOPIC GASTRIC BANDING  01/23/2003   LAPAROSCOPIC VAGINAL HYSTERECTOMY WITH SALPINGECTOMY Bilateral 10/12/2015   Procedure: HYSTERECTOMY TOTAL LAPAROSCOPIC BILATERAL SALPINGECTOMY;  Surgeon: Edwinna Areola, DO;  Location: [facility];  Service: Gynecology;  Laterality: Bilateral;   reposition gastric band  01/23/2004   x2   TUBAL LIGATION  07/16/2012   UPPER GI ENDOSCOPY N/A 12/21/2019   Procedure: UPPER GI ENDOSCOPY;  Surgeon: Gaynelle Adu, MD;  Location: [facility];  Service: General;  Laterality: N/A;   WISDOM TOOTH EXTRACTION  01/22/1997    FAMILY HISTORY: Family History  Problem Relation Age of Onset   Hypertension Mother    Diabetes Other        father's family    Thyroid cancer Other    Breast cancer Maternal Grandmother     SOCIAL HISTORY: Social History   Socioeconomic History   Marital status: Married    Spouse name: Archer Asa   Number of children: 4   Years of education: Not on file   Highest education level: Some college, no degree  Occupational History   Occupation: homemaker  Tobacco Use   Smoking status: Never   Smokeless tobacco: Never  Vaping Use   Vaping status: Never Used  Substance and Sexual Activity   Alcohol use: Yes    Alcohol/week: 1.0 standard drink of alcohol    Types: 1 Standard drinks or equivalent per week    Comment: occ   Drug use: No   Sexual activity: Yes    Birth control/protection: Surgical    Comment: separated, 2 caffeine drinks daily.   Other Topics Concern   Not on file  Social History Narrative   2 caffeine drinks per day. Stay at home mom. Does yoga and pilates.    Social Determinants of Health   Financial Resource Strain: Medium Risk (05/15/2022)   Overall Financial Resource Strain (CARDIA)    Difficulty of Paying Living Expenses: Somewhat hard  Food Insecurity: Food Insecurity Present (05/15/2022)   Hunger Vital Sign    Worried About Running Out of Food in the Last Year: Sometimes true    Ran Out of Food in the Last Year: Sometimes true  Transportation Needs: No Transportation Needs (05/15/2022)   PRAPARE - Administrator, Civil Service (Medical): No    Lack of Transportation (Non-Medical): No  Physical Activity: Unknown (05/15/2022)   Exercise Vital Sign    Days of Exercise per Week: 0 days    Minutes of Exercise per Session: Not on file  Stress: Stress Concern Present (05/15/2022)   [employer] of Occupational Health - Occupational Stress Questionnaire    Feeling of Stress : To some extent  Social Connections: Unknown (09/04/2022)   Received from [facility]   Social Network    Social Network: Not on file  Intimate Partner Violence: Unknown (09/04/2022)   Received from  [facility]   HITS    Physically Hurt: Not on file    Insult or Talk Down To: Not on file    Threaten Physical Harm: Not on file    Scream or Curse: Not on  file      Levert Feinstein, M.D. Ph.D.  [facility] [address] [place], [place] [address] Ph: [phone] Fax: [phone]  CC:  Agapito Games, MD [address],  [place] [address]  Agapito Games, MD

## 2022-10-09 DIAGNOSIS — L218 Other seborrheic dermatitis: Secondary | ICD-10-CM | POA: Diagnosis not present

## 2022-10-12 ENCOUNTER — Other Ambulatory Visit (HOSPITAL_COMMUNITY): Payer: Self-pay

## 2022-10-19 LAB — HM DIABETES EYE EXAM

## 2022-10-22 ENCOUNTER — Telehealth: Payer: Self-pay

## 2022-10-22 ENCOUNTER — Encounter: Payer: Self-pay | Admitting: Family Medicine

## 2022-10-22 NOTE — Telephone Encounter (Signed)
Initiated Prior authorization UJW:[id number] 70MG /ML auto-injectors Via: Covermymeds Case/Key:[id number] Status: denied as of 10/22/22 Reason:We reviewed the information you provided in support of a request to obtain Aimovig  autoinjector 70 mg/ml AUTO INJCT under your patient's plan. We are unable to approve this  request for the following reason(s): ? Coverage is provided for migraine headache prevention. Coverage cannot be  authorized at this time. Other coverage conditions may apply.  We based this decision on the prior authorization criteria for: 29562 Migraine - Calcitonin  Gene-Related Peptide Inhibitors - Aimovig Care Value Policy  We are providing you with a copy of the Notice of Adverse Benefit Determination that we sent  to your patient. The appeal rights and procedures are explained in the notice.  Notified Pt via: Mychart

## 2022-11-09 ENCOUNTER — Other Ambulatory Visit (HOSPITAL_COMMUNITY): Payer: Self-pay

## 2022-11-10 ENCOUNTER — Other Ambulatory Visit (HOSPITAL_COMMUNITY): Payer: Self-pay

## 2022-11-12 ENCOUNTER — Other Ambulatory Visit (HOSPITAL_COMMUNITY): Payer: Self-pay

## 2022-11-21 ENCOUNTER — Other Ambulatory Visit (HOSPITAL_COMMUNITY): Payer: Self-pay

## 2022-11-23 ENCOUNTER — Other Ambulatory Visit: Payer: Self-pay | Admitting: Family Medicine

## 2022-11-23 DIAGNOSIS — B3731 Acute candidiasis of vulva and vagina: Secondary | ICD-10-CM

## 2022-11-29 ENCOUNTER — Other Ambulatory Visit (HOSPITAL_COMMUNITY): Payer: Self-pay

## 2022-11-30 ENCOUNTER — Other Ambulatory Visit (HOSPITAL_COMMUNITY): Payer: Self-pay

## 2022-12-01 ENCOUNTER — Other Ambulatory Visit (HOSPITAL_COMMUNITY): Payer: Self-pay

## 2022-12-21 ENCOUNTER — Other Ambulatory Visit (HOSPITAL_COMMUNITY): Payer: Self-pay

## 2022-12-31 ENCOUNTER — Ambulatory Visit: Payer: BC Managed Care – PPO | Admitting: Family Medicine

## 2023-01-01 LAB — HM COLONOSCOPY

## 2023-01-08 ENCOUNTER — Encounter: Payer: Self-pay | Admitting: Family Medicine

## 2023-01-08 NOTE — Telephone Encounter (Signed)
 Care team updated and letter sent for eye exam notes.

## 2023-01-17 ENCOUNTER — Other Ambulatory Visit (HOSPITAL_COMMUNITY): Payer: Self-pay

## 2023-01-19 ENCOUNTER — Other Ambulatory Visit (HOSPITAL_COMMUNITY): Payer: Self-pay

## 2023-01-19 ENCOUNTER — Other Ambulatory Visit: Payer: Self-pay | Admitting: Family Medicine

## 2023-01-19 DIAGNOSIS — E118 Type 2 diabetes mellitus with unspecified complications: Secondary | ICD-10-CM

## 2023-01-21 ENCOUNTER — Other Ambulatory Visit (HOSPITAL_COMMUNITY): Payer: Self-pay

## 2023-01-22 ENCOUNTER — Other Ambulatory Visit (HOSPITAL_BASED_OUTPATIENT_CLINIC_OR_DEPARTMENT_OTHER): Payer: Self-pay

## 2023-01-22 ENCOUNTER — Other Ambulatory Visit (HOSPITAL_COMMUNITY): Payer: Self-pay

## 2023-01-22 ENCOUNTER — Other Ambulatory Visit: Payer: Self-pay

## 2023-01-22 ENCOUNTER — Encounter: Payer: Self-pay | Admitting: Family Medicine

## 2023-01-22 ENCOUNTER — Other Ambulatory Visit: Payer: Self-pay | Admitting: Family Medicine

## 2023-01-22 DIAGNOSIS — E118 Type 2 diabetes mellitus with unspecified complications: Secondary | ICD-10-CM

## 2023-01-22 MED ORDER — OZEMPIC (2 MG/DOSE) 8 MG/3ML ~~LOC~~ SOPN
2.0000 mg | PEN_INJECTOR | SUBCUTANEOUS | 1 refills | Status: DC
  Filled 2023-01-22: qty 3, 28d supply, fill #0

## 2023-01-24 ENCOUNTER — Telehealth: Payer: Self-pay | Admitting: *Deleted

## 2023-01-24 ENCOUNTER — Telehealth: Payer: Self-pay

## 2023-01-24 ENCOUNTER — Encounter: Payer: Self-pay | Admitting: Neurology

## 2023-01-24 ENCOUNTER — Other Ambulatory Visit (HOSPITAL_COMMUNITY): Payer: Self-pay

## 2023-01-24 MED ORDER — AJOVY 225 MG/1.5ML ~~LOC~~ SOAJ: 225.0000 mg | SUBCUTANEOUS | 11 refills | Status: AC

## 2023-01-24 NOTE — Telephone Encounter (Signed)
 Meds ordered this encounter  ?Medications  ? Fremanezumab-vfrm (AJOVY) 225 MG/1.5ML SOAJ  ?  Sig: Inject 225 mg into the skin every 30 (thirty) days.  ?  Dispense:  1.68 mL  ?  Refill:  11  ?   ?

## 2023-01-24 NOTE — Telephone Encounter (Signed)
 Pt reports Aimovig PA needed by new insurance. Card attached to chart.

## 2023-01-24 NOTE — Telephone Encounter (Signed)
   Insurance would like PT to try Ajovy, Emgality or Qulipta-Please advise

## 2023-01-25 ENCOUNTER — Telehealth: Payer: Self-pay

## 2023-01-25 ENCOUNTER — Other Ambulatory Visit (HOSPITAL_COMMUNITY): Payer: Self-pay

## 2023-01-25 NOTE — Telephone Encounter (Signed)
 PA request has been Submitted. New Encounter created for follow up. For additional info see Pharmacy Prior Auth telephone encounter from 01/25/2023 for Ajovy.

## 2023-01-25 NOTE — Telephone Encounter (Signed)
 Pharmacy Patient Advocate Encounter   Received notification from Physician's Office that prior authorization for AJOVY  (fremanezumab -vfrm) injection 225MG /1.5ML auto-injectors is required/requested.   Insurance verification completed.   The patient is insured through [facility] .   Per test claim: PA required; PA submitted to above mentioned insurance via CoverMyMeds Key/confirmation #/EOC [id number] Status is pending

## 2023-01-28 ENCOUNTER — Ambulatory Visit: Payer: 59 | Admitting: Family Medicine

## 2023-01-29 ENCOUNTER — Other Ambulatory Visit (HOSPITAL_COMMUNITY): Payer: Self-pay

## 2023-01-29 NOTE — Telephone Encounter (Signed)
 PA request has been Denied. New Encounter created for follow up. For additional info see Pharmacy Prior Auth telephone encounter from 01/24/2023.

## 2023-01-29 NOTE — Telephone Encounter (Signed)
     It was filled on 01/24/2023 for 84DS-next fill date is 04/09/2023

## 2023-02-06 ENCOUNTER — Ambulatory Visit (INDEPENDENT_AMBULATORY_CARE_PROVIDER_SITE_OTHER): Payer: 59 | Admitting: Family Medicine

## 2023-02-06 ENCOUNTER — Other Ambulatory Visit (HOSPITAL_COMMUNITY): Payer: Self-pay

## 2023-02-06 ENCOUNTER — Encounter: Payer: Self-pay | Admitting: Family Medicine

## 2023-02-06 VITALS — BP 124/87 | HR 112 | Ht 64.0 in | Wt 196.0 lb

## 2023-02-06 DIAGNOSIS — D509 Iron deficiency anemia, unspecified: Secondary | ICD-10-CM

## 2023-02-06 DIAGNOSIS — E118 Type 2 diabetes mellitus with unspecified complications: Secondary | ICD-10-CM

## 2023-02-06 DIAGNOSIS — Z9884 Bariatric surgery status: Secondary | ICD-10-CM | POA: Diagnosis not present

## 2023-02-06 DIAGNOSIS — G43709 Chronic migraine without aura, not intractable, without status migrainosus: Secondary | ICD-10-CM

## 2023-02-06 DIAGNOSIS — Z1159 Encounter for screening for other viral diseases: Secondary | ICD-10-CM

## 2023-02-06 LAB — POCT GLYCOSYLATED HEMOGLOBIN (HGB A1C): Hemoglobin A1C: 5.9 % — AB (ref 4.0–5.6)

## 2023-02-06 MED ORDER — TIRZEPATIDE 12.5 MG/0.5ML ~~LOC~~ SOAJ
12.5000 mg | SUBCUTANEOUS | 0 refills | Status: DC
  Filled 2023-02-06: qty 2, 28d supply, fill #0
  Filled 2023-03-16: qty 2, 28d supply, fill #1
  Filled 2023-04-27: qty 2, 28d supply, fill #2

## 2023-02-06 NOTE — Assessment & Plan Note (Signed)
 Due to check ferritin

## 2023-02-06 NOTE — Progress Notes (Signed)
Established Patient Office Visit  Subjective  Patient ID: Jamie Mccarthy, female    DOB: [birth year]  Age: 48 y.o. MRN: [id number]  Chief Complaint  Patient presents with   Diabetes    HPI   Diabetes - no hypoglycemic events. No wounds or sores that are not healing well. No increased thirst or urination. Checking glucose at home. Taking medications as prescribed without any side effects. Says she didn't do well w/ diet choices over the Holidays.   Migraine headaches-Imitrex cause burning sensation in her chest.  Be switching from Aimovig to Ajovy.  Going to try Maxalt instead.    ROS    Objective:     BP 124/87   Pulse (!) 112   Ht 5\' 4"  (1.626 m)   Wt 196 lb (88.9 kg)   LMP 08/29/2015 (Approximate)   SpO2 100%   BMI 33.64 kg/m    Physical Exam Vitals and nursing note reviewed.  Constitutional:      Appearance: Normal appearance.  HENT:     Head: Normocephalic and atraumatic.  Eyes:     Conjunctiva/sclera: Conjunctivae normal.  Cardiovascular:     Rate and Rhythm: Normal rate and regular rhythm.  Pulmonary:     Effort: Pulmonary effort is normal.     Breath sounds: Normal breath sounds.  Skin:    General: Skin is warm and dry.  Neurological:     Mental Status: She is alert.  Psychiatric:        Mood and Affect: Mood normal.      Results for orders placed or performed in visit on 02/06/23  Lipid Panel With LDL/HDL Ratio  Result Value Ref Range   Cholesterol, Total 162 100 - 199 mg/dL   Triglycerides 66 0 - 149 mg/dL   HDL 54 >38 mg/dL   VLDL Cholesterol Cal 13 5 - 40 mg/dL   LDL Chol Calc (NIH) 95 0 - 99 mg/dL   LDL/HDL Ratio 1.8 0.0 - 3.2 ratio  CMP14+EGFR  Result Value Ref Range   Glucose 93 70 - 99 mg/dL   BUN 9 6 - 24 mg/dL   Creatinine, Ser 7.56 0.57 - 1.00 mg/dL   eGFR 93 >43 PI/RJJ/8.84   BUN/Creatinine Ratio 11 9 - 23   Sodium 141 134 - 144 mmol/L   Potassium 4.1 3.5 - 5.2 mmol/L   Chloride 104 96 - 106 mmol/L   CO2 23 20 - 29  mmol/L   Calcium 9.9 8.7 - 10.2 mg/dL   Total Protein 6.6 6.0 - 8.5 g/dL   Albumin 4.1 3.9 - 4.9 g/dL   Globulin, Total 2.5 1.5 - 4.5 g/dL   Bilirubin Total 0.5 0.0 - 1.2 mg/dL   Alkaline Phosphatase 84 44 - 121 IU/L   AST 24 0 - 40 IU/L   ALT 16 0 - 32 IU/L  Hepatitis C Antibody  Result Value Ref Range   Hep C Virus Ab Non Reactive Non Reactive  Vitamin D (25 hydroxy)  Result Value Ref Range   Vit D, 25-Hydroxy 24.4 (L) 30.0 - 100.0 ng/mL  Ferritin  Result Value Ref Range   Ferritin 11 (L) 15 - 150 ng/mL  POCT HgB A1C  Result Value Ref Range   Hemoglobin A1C 5.9 (A) 4.0 - 5.6 %   HbA1c POC (<> result, manual entry)     HbA1c, POC (prediabetic range)     HbA1c, POC (controlled diabetic range)        The 10-year ASCVD risk score (  Arnett DK, et al., 2019) is: 1.9%    Assessment & Plan:   Problem List Items Addressed This Visit       Cardiovascular and Mediastinum   Chronic migraine w/o aura w/o status migrainosus, not intractable   Changing to Ajjovy.  Will start it soon.         Endocrine   Controlled type 2 diabetes mellitus with complication, without long-term current use of insulin (HCC) - Primary   Well controlled. 5.9. Continue current regimen. Follow up in  4 months. Would like to change to [facility] if covered by insurance o improve appetite control. .       Relevant Medications   tirzepatide (MOUNJARO) 12.5 MG/0.5ML Pen   Other Relevant Orders   POCT HgB A1C (Completed)   Lipid Panel With LDL/HDL Ratio (Completed)   CMP14+EGFR (Completed)   Hepatitis C Antibody (Completed)   Vitamin D (25 hydroxy) (Completed)   Ferritin (Completed)     Other   Iron deficiency anemia, unspecified   Due to check ferritin      Relevant Orders   Lipid Panel With LDL/HDL Ratio (Completed)   CMP14+EGFR (Completed)   Hepatitis C Antibody (Completed)   Vitamin D (25 hydroxy) (Completed)   Ferritin (Completed)   H/O laparoscopic adjustable gastric banding 02/2003    Other Visit Diagnoses       Encounter for hepatitis C screening test for low risk patient       Relevant Orders   Lipid Panel With LDL/HDL Ratio (Completed)   CMP14+EGFR (Completed)   Hepatitis C Antibody (Completed)   Vitamin D (25 hydroxy) (Completed)   Ferritin (Completed)       Return in about 4 months (around 06/06/2023) for Diabetes follow-up.    Nani Gasser, MD

## 2023-02-06 NOTE — Assessment & Plan Note (Addendum)
Well controlled. 5.9. Continue current regimen. Follow up in  4 months. Would like to change to [facility] if covered by insurance o improve appetite control. Marland Kitchen

## 2023-02-06 NOTE — Assessment & Plan Note (Signed)
 Changing to Jamie Mccarthy.  Will start it soon.

## 2023-02-07 ENCOUNTER — Encounter: Payer: Self-pay | Admitting: Family Medicine

## 2023-02-07 LAB — CMP14+EGFR
ALT: 16 [IU]/L (ref 0–32)
AST: 24 [IU]/L (ref 0–40)
Albumin: 4.1 g/dL (ref 3.9–4.9)
Alkaline Phosphatase: 84 [IU]/L (ref 44–121)
BUN/Creatinine Ratio: 11 (ref 9–23)
BUN: 9 mg/dL (ref 6–24)
Bilirubin Total: 0.5 mg/dL (ref 0.0–1.2)
CO2: 23 mmol/L (ref 20–29)
Calcium: 9.9 mg/dL (ref 8.7–10.2)
Chloride: 104 mmol/L (ref 96–106)
Creatinine, Ser: 0.8 mg/dL (ref 0.57–1.00)
Globulin, Total: 2.5 g/dL (ref 1.5–4.5)
Glucose: 93 mg/dL (ref 70–99)
Potassium: 4.1 mmol/L (ref 3.5–5.2)
Sodium: 141 mmol/L (ref 134–144)
Total Protein: 6.6 g/dL (ref 6.0–8.5)
eGFR: 93 mL/min/{1.73_m2} (ref >59)

## 2023-02-07 LAB — FERRITIN: Ferritin: 11 ng/mL — ABNORMAL LOW (ref 15–150)

## 2023-02-07 LAB — HEPATITIS C ANTIBODY: Hep C Virus Ab: NONREACTIVE

## 2023-02-07 LAB — VITAMIN D 25 HYDROXY (VIT D DEFICIENCY, FRACTURES): Vit D, 25-Hydroxy: 24.4 ng/mL — ABNORMAL LOW (ref 30.0–100.0)

## 2023-02-07 LAB — LIPID PANEL WITH LDL/HDL RATIO
Cholesterol, Total: 162 mg/dL (ref 100–199)
HDL: 54 mg/dL (ref >39)
LDL Chol Calc (NIH): 95 mg/dL (ref 0–99)
LDL/HDL Ratio: 1.8 {ratio} (ref 0.0–3.2)
Triglycerides: 66 mg/dL (ref 0–149)
VLDL Cholesterol Cal: 13 mg/dL (ref 5–40)

## 2023-02-07 NOTE — Progress Notes (Signed)
Hi Jamie Mccarthy, vitamin D has dropped back down again so just make sure you are taking 25 mcg daily.  Your iron stores are around 11 so they are low again as well.  This can cause fatigue and even shortness of breath with activities.  Are you taking any extra iron right now?  Cholesterol levels look great.  Negative for hep C.  Metabolic panel looks great.

## 2023-02-08 ENCOUNTER — Other Ambulatory Visit (HOSPITAL_COMMUNITY): Payer: Self-pay

## 2023-02-08 NOTE — Telephone Encounter (Signed)
Vit D 25 mcg daily - Doesn't matter brand Please start iron. Recheck in 8 weeks

## 2023-03-10 ENCOUNTER — Other Ambulatory Visit: Payer: Self-pay | Admitting: Family Medicine

## 2023-03-10 DIAGNOSIS — F419 Anxiety disorder, unspecified: Secondary | ICD-10-CM

## 2023-03-10 DIAGNOSIS — R232 Flushing: Secondary | ICD-10-CM

## 2023-03-10 DIAGNOSIS — F4321 Adjustment disorder with depressed mood: Secondary | ICD-10-CM

## 2023-03-11 ENCOUNTER — Encounter: Payer: Self-pay | Admitting: Family Medicine

## 2023-03-11 ENCOUNTER — Ambulatory Visit: Payer: 59 | Admitting: Family Medicine

## 2023-03-11 VITALS — BP 116/79 | HR 80 | Temp 98.2°F | Ht 64.0 in | Wt 196.0 lb

## 2023-03-11 DIAGNOSIS — J069 Acute upper respiratory infection, unspecified: Secondary | ICD-10-CM | POA: Diagnosis not present

## 2023-03-11 DIAGNOSIS — R6889 Other general symptoms and signs: Secondary | ICD-10-CM | POA: Diagnosis not present

## 2023-03-11 LAB — POC COVID19 BINAXNOW: SARS Coronavirus 2 Ag: NEGATIVE

## 2023-03-11 LAB — POCT INFLUENZA A/B
Influenza A, POC: NEGATIVE
Influenza B, POC: NEGATIVE

## 2023-03-11 NOTE — Patient Instructions (Signed)

## 2023-03-11 NOTE — Progress Notes (Signed)
Jamie Mccarthy - 48 y.o. female MRN [id number]  Date of birth: [birth year]  Subjective Chief Complaint  Patient presents with   Flu Symptoms    HPI Jamie Mccarthy is a 48 y.o. female here today with flu like symptoms.  Her symptoms include headache, fatigue, body aches, chills, and congestion.  Her symptoms started 3 days ago.  So far she has tried tylenol with some relief of symptoms . She doesn't think she has fever and denies cough or wheezing.   ROS:  A comprehensive ROS was completed and negative except as noted per HPI  Allergies  Allergen Reactions   Latex Itching    Pt states she gets severely itchy with latex   Imitrex [Sumatriptan] Other (See Comments)    Burning sensation in the chest   Metformin And Related Diarrhea   Naproxen Rash    Past Medical History:  Diagnosis Date   Anemia    Has sickle cell trait   Anxiety    Depression    GERD (gastroesophageal reflux disease)    diet controlled   Gestational diabetes    H/O vaginal delivery 2010   Headache(784.0)    Migraines   Herpes 2007   hx of   Iron deficiency anemia, unspecified 10/20/2012   Neuromuscular disorder (HCC)    Obese    PONV (postoperative nausea and vomiting)    has had PONV with previous c/s and also lap band surgery 2004   Raynaud's disease    Seasonal allergies     Past Surgical History:  Procedure Laterality Date   BILATERAL SALPINGECTOMY  07/16/2012   Procedure: BILATERAL DISTAL SALPINGECTOMY;  Surgeon: Oliver Pila, MD;  Location: [facility];  Service: Obstetrics;;   CESAREAN SECTION  01/22/2010   CESAREAN SECTION N/A 07/16/2012   Procedure: REPEAT CESAREAN SECTION;  Surgeon: Oliver Pila, MD;  Location: [facility];  Service: Obstetrics;  Laterality: N/A;   CHOLECYSTECTOMY N/A 11/14/2012   Procedure: LAPAROSCOPIC CHOLECYSTECTOMY;  Surgeon: Atilano Ina, MD;  Location: [facility];  Service: General;  Laterality: N/A;   GASTRIC BANDING PORT REVISION N/A 11/14/2012   Procedure:  GASTRIC BANDING PORT REVISION;  Surgeon: Atilano Ina, MD;  Location: [facility];  Service: General;  Laterality: N/A;  PORT PLACEMENT MOVED NEW MESH INSERTED    GASTRIC ROUX-EN-Y N/A 12/21/2019   Procedure: LAPAROSCOPIC ROUX-EN-Y GASTRIC BYPASS;  Surgeon: Gaynelle Adu, MD;  Location: [facility];  Service: General;  Laterality: N/A;   LAPAROSCOPIC GASTRIC BANDING  01/23/2003   LAPAROSCOPIC VAGINAL HYSTERECTOMY WITH SALPINGECTOMY Bilateral 10/12/2015   Procedure: HYSTERECTOMY TOTAL LAPAROSCOPIC BILATERAL SALPINGECTOMY;  Surgeon: Edwinna Areola, DO;  Location: [facility];  Service: Gynecology;  Laterality: Bilateral;   reposition gastric band  01/23/2004   x2   TUBAL LIGATION  07/16/2012   UPPER GI ENDOSCOPY N/A 12/21/2019   Procedure: UPPER GI ENDOSCOPY;  Surgeon: Gaynelle Adu, MD;  Location: [facility];  Service: General;  Laterality: N/A;   WISDOM TOOTH EXTRACTION  01/22/1997    Social History   Socioeconomic History   Marital status: Married    Spouse name: Jamie Mccarthy   Number of children: 4   Years of education: Not on file   Highest education level: Some college, no degree  Occupational History   Occupation: homemaker  Tobacco Use   Smoking status: Never   Smokeless tobacco: Never  Vaping Use   Vaping status: Never Used  Substance and Sexual Activity   Alcohol use: Yes    Alcohol/week:  1.0 standard drink of alcohol    Types: 1 Standard drinks or equivalent per week    Comment: occ   Drug use: No   Sexual activity: Yes    Birth control/protection: Surgical    Comment: separated, 2 caffeine drinks daily.   Other Topics Concern   Not on file  Social History Narrative   2 caffeine drinks per day. Stay at home mom. Does yoga and pilates.    Social Drivers of Health   Financial Resource Strain: Medium Risk (05/15/2022)   Overall Financial Resource Strain (CARDIA)    Difficulty of Paying Living Expenses: Somewhat hard  Food Insecurity: Food Insecurity Present (05/15/2022)   Hunger  Vital Sign    Worried About Running Out of Food in the Last Year: Sometimes true    Ran Out of Food in the Last Year: Sometimes true  Transportation Needs: No Transportation Needs (05/15/2022)   PRAPARE - Administrator, Civil Service (Medical): No    Lack of Transportation (Non-Medical): No  Physical Activity: Unknown (05/15/2022)   Exercise Vital Sign    Days of Exercise per Week: 0 days    Minutes of Exercise per Session: Not on file  Stress: Stress Concern Present (05/15/2022)   [employer] of Occupational Health - Occupational Stress Questionnaire    Feeling of Stress : To some extent  Social Connections: Unknown (09/04/2022)   Received from [facility]   Social Network    Social Network: Not on file    Family History  Problem Relation Age of Onset   Hypertension Mother    Diabetes Other        father's family   Thyroid cancer Other    Breast cancer Maternal Grandmother     Health Maintenance  Topic Date Due   Colonoscopy  Never done   COVID-19 Vaccine (5 - 2024-25 season) 09/23/2022   Diabetic kidney evaluation - Urine ACR  05/02/2023   HEMOGLOBIN A1C  08/06/2023   FOOT EXAM  08/31/2023   OPHTHALMOLOGY EXAM  10/19/2023   Diabetic kidney evaluation - eGFR measurement  02/06/2024   DTaP/Tdap/Td (3 - Td or Tdap) 08/30/2032   Pneumococcal Vaccine 46-4 Years old  Completed   INFLUENZA VACCINE  Completed   HPV VACCINES  Completed   Hepatitis C Screening  Completed   HIV Screening  Completed     ----------------------------------------------------------------------------------------------------------------------------------------------------------------------------------------------------------------- Physical Exam BP 116/79 (BP Location: Left Arm, Patient Position: Sitting, Cuff Size: Large)   Pulse 80   Temp 98.2 F (36.8 C) (Oral)   Ht 5\' 4"  (1.626 m)   Wt 196 lb (88.9 kg)   LMP 08/29/2015 (Approximate)   SpO2 100%   BMI 33.64 kg/m    Physical Exam Constitutional:      Appearance: Normal appearance.  Eyes:     General: No scleral icterus. Cardiovascular:     Rate and Rhythm: Normal rate and regular rhythm.  Pulmonary:     Effort: Pulmonary effort is normal.     Breath sounds: Normal breath sounds.  Neurological:     Mental Status: She is alert.  Psychiatric:        Mood and Affect: Mood normal.        Behavior: Behavior normal.     ------------------------------------------------------------------------------------------------------------------------------------------------------------------------------------------------------------------- Assessment and Plan  URI (upper respiratory infection) POC testing for COVID and influenza are negative.  Recommend supportive care with increased fluids and rest.  OTC medications for symptom management.  Would expect to improve over the next week.  Contact clinic if having worsening signs.    No orders of the defined types were placed in this encounter.   No follow-ups on file.    This visit occurred during the SARS-CoV-2 public health emergency.  Safety protocols were in place, including screening questions prior to the visit, additional usage of staff PPE, and extensive cleaning of exam room while observing appropriate contact time as indicated for disinfecting solutions.

## 2023-03-11 NOTE — Assessment & Plan Note (Signed)
POC testing for COVID and influenza are negative.  Recommend supportive care with increased fluids and rest.  OTC medications for symptom management.  Would expect to improve over the next week. Contact clinic if having worsening signs.

## 2023-03-16 ENCOUNTER — Other Ambulatory Visit (HOSPITAL_COMMUNITY): Payer: Self-pay

## 2023-03-20 ENCOUNTER — Encounter: Payer: Self-pay | Admitting: Family Medicine

## 2023-03-21 NOTE — Telephone Encounter (Signed)
 We can do the sports form through the communications tab and I can do that pretty quickly.  But I would really need a copy of her eye exam.  And she would still need to fill out the initial page which is the symptom checklist that the parent has to complete.

## 2023-03-22 ENCOUNTER — Encounter: Payer: Self-pay | Admitting: Family Medicine

## 2023-03-22 ENCOUNTER — Ambulatory Visit: Payer: Self-pay | Admitting: Family Medicine

## 2023-03-22 ENCOUNTER — Encounter: Payer: Self-pay | Admitting: Neurology

## 2023-03-22 ENCOUNTER — Telehealth: Payer: 59 | Admitting: Physician Assistant

## 2023-03-22 DIAGNOSIS — J329 Chronic sinusitis, unspecified: Secondary | ICD-10-CM | POA: Diagnosis not present

## 2023-03-22 DIAGNOSIS — R519 Headache, unspecified: Secondary | ICD-10-CM | POA: Diagnosis not present

## 2023-03-22 MED ORDER — DOXYCYCLINE HYCLATE 100 MG PO CAPS: 100.0000 mg | ORAL_CAPSULE | Freq: Two times a day (BID) | ORAL | 0 refills | Status: DC

## 2023-03-22 MED ORDER — AMOXICILLIN-POT CLAVULANATE 875-125 MG PO TABS: 1.0000 | ORAL_TABLET | Freq: Two times a day (BID) | ORAL | 0 refills | Status: AC

## 2023-03-22 MED ORDER — DEXAMETHASONE 6 MG PO TABS: 10.0000 mg | ORAL_TABLET | Freq: Once | ORAL | 0 refills | Status: AC

## 2023-03-22 NOTE — Telephone Encounter (Signed)
 Would you like for Pt to come in for Office Visit Pt hasn't been seen since 10/05/2022 No Upcoming Appointment

## 2023-03-22 NOTE — Patient Instructions (Signed)
 Jamie Mccarthy, thank you for joining [employer], PA-C for today's virtual visit.  While this provider is not your primary care provider (PCP), if your PCP is located in our provider database this encounter information will be shared with them immediately following your visit.   A [facility] MyChart account gives you access to today's visit and all your visits, tests, and labs performed at [facility] " click here if you don't have a [facility] MyChart account or go to [email or web address]  Consent: (Patient) Jamie Mccarthy provided verbal consent for this virtual visit at the beginning of the encounter.  Current Medications:  Current Outpatient Medications:    amoxicillin-clavulanate (AUGMENTIN) 875-125 MG tablet, Take 1 tablet by mouth 2 (two) times daily for 7 days., Disp: 14 tablet, Rfl: 0   dexamethasone (DECADRON) 6 MG tablet, Take 1.5 tablets (9 mg total) by mouth once for 1 dose., Disp: 1.5 tablet, Rfl: 0   B Complex-C (B-COMPLEX WITH VITAMIN C) tablet, Take 1 tablet by mouth daily., Disp: 90 tablet, Rfl: 3   Clobetasol Propionate 0.05 % shampoo, Wash scalp once or twice weekly with shampoo, Disp: 118 mL, Rfl: 3   fluconazole (DIFLUCAN) 200 MG tablet, TAKE 1 TABLET EVERY 3 DAYS FOR 3 DOSES ( 6 DAY SPAN) THEN TAKE 1 TABLET BY MOUTH ONCE A WEEK, Disp: 6 tablet, Rfl: 3   Fremanezumab-vfrm (AJOVY) 225 MG/1.5ML SOAJ, Inject 225 mg into the skin every 30 (thirty) days., Disp: 1.68 mL, Rfl: 11   ketoconazole (NIZORAL) 2 % cream, Apply topically daily as needed for irritation., Disp: 15 g, Rfl: 0   Lancets Misc. KIT, Contour Next lancets, patient preference. For testing blood sugars up to twice daily. Dx:E11.65, Disp: 1 kit, Rfl: 0   levocetirizine (XYZAL) 5 MG tablet, Take 1 tablet (5 mg total) by mouth every evening., Disp: 90 tablet, Rfl: 3   rizatriptan (MAXALT-MLT) 10 MG disintegrating tablet, Take 1 tablet (10 mg total) by mouth as needed. May repeat in 2 hours  if needed, Disp: 10 tablet, Rfl: 6   tirzepatide (MOUNJARO) 12.5 MG/0.5ML Pen, Inject 12.5 mg into the skin once a week. *REPLACES OZEMPIC*, Disp: 6 mL, Rfl: 0   venlafaxine XR (EFFEXOR-XR) 75 MG 24 hr capsule, TAKE 1 CAPSULE BY MOUTH DAILY WITH BREAKFAST., Disp: 90 capsule, Rfl: 1   VEOZAH 45 MG TABS, Take 1 tablet by mouth daily., Disp: , Rfl:    Vitamin D, Ergocalciferol, (DRISDOL) 1.25 MG (50000 UNIT) CAPS capsule, TAKE 1 CAPSULE BY MOUTH ONE TIME PER WEEK, Disp: 12 capsule, Rfl: 6   Medications ordered in this encounter:  Meds ordered this encounter  Medications   DISCONTD: doxycycline (VIBRAMYCIN) 100 MG capsule    Sig: Take 1 capsule (100 mg total) by mouth 2 (two) times daily for 7 days.    Dispense:  14 capsule    Refill:  0   dexamethasone (DECADRON) 6 MG tablet    Sig: Take 1.5 tablets (9 mg total) by mouth once for 1 dose.    Dispense:  1.5 tablet    Refill:  0   amoxicillin-clavulanate (AUGMENTIN) 875-125 MG tablet    Sig: Take 1 tablet by mouth 2 (two) times daily for 7 days.    Dispense:  14 tablet    Refill:  0     *If you need refills on other medications prior to your next appointment, please contact your pharmacy*  Follow-Up: Call back or seek an in-person evaluation if the symptoms  worsen or if the condition fails to improve as anticipated.  [facility] [phone]  Other Instructions Take medications as prescribed   Follow up with your regular doctor in 1 week for reassessment and seek care sooner if your symptoms worsen or fail to improve.    If you have been instructed to have an in-person evaluation today at a local Urgent Care facility, please use the link below. It will take you to a list of all of our available [facility], including address, phone number and hours of operation. Please do not delay care.  [facility]  If you or a family member do not have a primary care provider, use the link below to  schedule a visit and establish care. When you choose a [facility] primary care physician or advanced practice provider, you gain a long-term partner in health. Find a Primary Care Provider  Learn more about [facility]'s in-office and virtual care options: [facility] - Get Care Now

## 2023-03-22 NOTE — Progress Notes (Signed)
 [name], oquendo are scheduled for a virtual visit with your provider today.    Just as we do with appointments in the office, we must obtain your consent to participate.  Your consent will be active for this visit and any virtual visit you may have with one of our providers in the next 365 days.    If you have a MyChart account, I can also send a copy of this consent to you electronically.  All virtual visits are billed to your insurance company just like a traditional visit in the office.  As this is a virtual visit, video technology does not allow for your provider to perform a traditional examination.  This may limit your provider's ability to fully assess your condition.  If your provider identifies any concerns that need to be evaluated in person or the need to arrange testing such as labs, EKG, etc, we will make arrangements to do so.    Although advances in technology are sophisticated, we cannot ensure that it will always work on either your end or our end.  If the connection with a video visit is poor, we may have to switch to a telephone visit.  With either a video or telephone visit, we are not always able to ensure that we have a secure connection.   I need to obtain your verbal consent now.   Are you willing to proceed with your visit today?   Leiliana A Dacey has provided verbal consent on 03/22/2023 for a virtual visit (video or telephone).   Karrie Meres, PA-C 03/22/2023  11:10 AM   Date:  03/22/2023   ID:  Jamie Mccarthy, DOB 1975/02/15, MRN [id number]  Patient Location: Home Provider Location: Home Office   Participants: Patient and Provider for Visit and Wrap up  Method of visit: Video  Location of Patient: Home Location of Provider: Home Office Consent was obtain for visit over the video. Services rendered by provider: Visit was performed via video  A video enabled telemedicine application was used and I verified that I am speaking with the correct person using two  identifiers.  PCP:  Agapito Games, MD   Chief Complaint:  left ear pain, headache  History of Present Illness:    Jamie Mccarthy is a 48 y.o. female with history as stated below. Presents video telehealth for an acute care visit  Pt states she has had left ear pressure and left sided headache. She reports nasal congestion as well since 03/06/22. She has been taking allergy medication, mucinex and sudafed without resolution of symptoms. Does report hx of sinus infection. She reports sinus pressure/pain. She tried taking her migraine medication without relief of symptoms. This headache feels similar to prior migraines. Denies visual changes, unilateral numbness/weakness.   Past Medical, Surgical, Social History, Allergies, and Medications have been Reviewed.  Past Medical History:  Diagnosis Date   Anemia    Has sickle cell trait   Anxiety    Depression    GERD (gastroesophageal reflux disease)    diet controlled   Gestational diabetes    H/O vaginal delivery 2010   Headache(784.0)    Migraines   Herpes 2007   hx of   Iron deficiency anemia, unspecified 10/20/2012   Neuromuscular disorder (HCC)    Obese    PONV (postoperative nausea and vomiting)    has had PONV with previous c/s and also lap band surgery 2004   Raynaud's disease    Seasonal allergies  Current Meds  Medication Sig   amoxicillin-clavulanate (AUGMENTIN) 875-125 MG tablet Take 1 tablet by mouth 2 (two) times daily for 7 days.   dexamethasone (DECADRON) 6 MG tablet Take 1.5 tablets (9 mg total) by mouth once for 1 dose.   [DISCONTINUED] doxycycline (VIBRAMYCIN) 100 MG capsule Take 1 capsule (100 mg total) by mouth 2 (two) times daily for 7 days.     Allergies:   Latex, Imitrex [sumatriptan], Metformin and related, and Naproxen   ROS See HPI for history of present illness.  Physical Exam Neurological:     Comments: Alert, CN II-XII intact. Moving all extremities. No pronator drift noted.                MDM:  Pt with headache and sinus sxs. Suspect sinus infection triggering migraines. Home meds not working. Pt unable to take nsaids. Will give rx for abx for sinus infection as well as steroids. Advised on plan for f/u and er/uc precautions.   Tests Ordered: No orders of the defined types were placed in this encounter.   Medication Changes: Meds ordered this encounter  Medications   DISCONTD: doxycycline (VIBRAMYCIN) 100 MG capsule    Sig: Take 1 capsule (100 mg total) by mouth 2 (two) times daily for 7 days.    Dispense:  14 capsule    Refill:  0   dexamethasone (DECADRON) 6 MG tablet    Sig: Take 1.5 tablets (9 mg total) by mouth once for 1 dose.    Dispense:  1.5 tablet    Refill:  0   amoxicillin-clavulanate (AUGMENTIN) 875-125 MG tablet    Sig: Take 1 tablet by mouth 2 (two) times daily for 7 days.    Dispense:  14 tablet    Refill:  0     Disposition:  Follow up  Signed, Tyarra Nolton Manfred Shirts, PA-C  03/22/2023 11:10 AM

## 2023-03-22 NOTE — Telephone Encounter (Addendum)
 Copied From CRM [id number].  Reason for Triage: Pink Word: Blood Sugar/ Dizziness   Patient inquired about possible appt and appt date and time was confirmed. When asked about diabetes patient informed agent she has not been checking her sugar regularly and has been experiencing some dizziness and throbbing whooshing sound in her left ear. Patient stated she will also follow up with her neurologist as well about it because she doesn't feel it is related to her blood sugar. She was informed if symptoms worsen to give the office a call.   Chief Complaint: dizziness Symptoms: dizzy, lightheaded, left ear fullness, pressure, headache Frequency: x this week  Pertinent Negatives: Patient denies no n/v, fever or increased HR Disposition: [] ED /[] Urgent Care (no appt availability in office) / [] Appointment(In office/virtual)/ [x]  [facility]/ [] Home Care/ [] Refused Recommended Disposition /[] [facility]/ []  Follow-up with PCP Additional Notes: Pt stated took BP at top number was 116: not sure of bottom number. C/o left ear sounds cloudy, pressure goes towards head, headache 3/10, muffling sound, nasal congestion.  Takes allergy medication sometimes helps & sometimes it does not.  Pt stated lightheadedness/dizzy at times. PCP no appts: scheduled virtual urgent care per pt request.    Reason for Disposition  [1] Sinus congestion (pressure, fullness) AND [2] present > 10 days  Answer Assessment - Initial Assessment Questions 1. DESCRIPTION: "Describe your dizziness."     dizziness 2. LIGHTHEADED: "Do you feel lightheaded?" (e.g., somewhat faint, woozy, weak upon standing)      Body feels warm then goes cool, woozy, 3. VERTIGO: "Do you feel like either you or the room is spinning or tilting?" (i.e. vertigo)     no 4. SEVERITY: "How bad is it?"  "Do you feel like you are going to faint?" "Can you stand and walk?"   - MILD: Feels slightly dizzy, but walking normally.   - MODERATE:  Feels unsteady when walking, but not falling; interferes with normal activities (e.g., school, work).   - SEVERE: Unable to walk without falling, or requires assistance to walk without falling; feels like passing out now.      Mild  5. ONSET:  "When did the dizziness begin?"     This week  6. AGGRAVATING FACTORS: "Does anything make it worse?" (e.g., standing, change in head position)     Changing position can make worse 7. HEART RATE: "Can you tell me your heart rate?" "How many beats in 15 seconds?"  (Note: not all patients can do this)       no 8. CAUSE: "What do you think is causing the dizziness?"     unknown 9. RECURRENT SYMPTOM: "Have you had dizziness before?" If Yes, ask: "When was the last time?" "What happened that time?"     Yes - last year and passed 10. OTHER SYMPTOMS: "Do you have any other symptoms?" (e.g., fever, chest pain, vomiting, diarrhea, bleeding)       no 11. PREGNANCY: "Is there any chance you are pregnant?" "When was your last menstrual period?"       N/a  Answer Assessment - Initial Assessment Questions 1. LOCATION: "Where does it hurt?"      Sinus pressure in head/left temple area, left ear 2. ONSET: "When did the sinus pain start?"  (e.g., hours, days)      X this week 3. SEVERITY: "How bad is the pain?"   (Scale 1-10; mild, moderate or severe)   - MILD (1-3): doesn't interfere with normal activities    -  MODERATE (4-7): interferes with normal activities (e.g., work or school) or awakens from sleep   - SEVERE (8-10): excruciating pain and patient unable to do any normal activities        3/10 4. RECURRENT SYMPTOM: "Have you ever had sinus problems before?" If Yes, ask: "When was the last time?" and "What happened that time?"      yes 5. NASAL CONGESTION: "Is the nose blocked?" If Yes, ask: "Can you open it or must you breathe through your mouth?"     Yes - but nasal congestion 6. NASAL DISCHARGE: "Do you have discharge from your nose?" If so ask, "What  color?"     N/a 7. FEVER: "Do you have a fever?" If Yes, ask: "What is it, how was it measured, and when did it start?"      no 8. OTHER SYMPTOMS: "Do you have any other symptoms?" (e.g., sore throat, cough, earache, difficulty breathing)     Left earache, headache, left ear sounds muffled - ear opens up and then closes 9. PREGNANCY: "Is there any chance you are pregnant?" "When was your last menstrual period?"     N/a  Protocols used: Dizziness - Lightheadedness-A-AH, Sinus Pain or Congestion-A-AH

## 2023-03-22 NOTE — Telephone Encounter (Signed)
 Mom brought her in for nurse viist. Forms completed and signed.

## 2023-03-28 ENCOUNTER — Encounter (INDEPENDENT_AMBULATORY_CARE_PROVIDER_SITE_OTHER): Payer: Self-pay | Admitting: Family Medicine

## 2023-03-28 DIAGNOSIS — Z711 Person with feared health complaint in whom no diagnosis is made: Secondary | ICD-10-CM | POA: Diagnosis not present

## 2023-03-29 NOTE — Telephone Encounter (Signed)
 This has been resolved

## 2023-03-29 NOTE — Telephone Encounter (Signed)

## 2023-03-29 NOTE — Telephone Encounter (Signed)
 I think she was referring to her daughter, I went ahead and sent it for her daughter.

## 2023-04-27 ENCOUNTER — Other Ambulatory Visit (HOSPITAL_COMMUNITY): Payer: Self-pay

## 2023-05-07 ENCOUNTER — Other Ambulatory Visit: Payer: Self-pay | Admitting: Family Medicine

## 2023-05-07 DIAGNOSIS — B3731 Acute candidiasis of vulva and vagina: Secondary | ICD-10-CM

## 2023-05-08 ENCOUNTER — Other Ambulatory Visit (HOSPITAL_COMMUNITY): Payer: Self-pay

## 2023-05-30 ENCOUNTER — Other Ambulatory Visit: Payer: Self-pay | Admitting: Family Medicine

## 2023-05-30 ENCOUNTER — Other Ambulatory Visit (HOSPITAL_COMMUNITY): Payer: Self-pay

## 2023-05-30 DIAGNOSIS — E118 Type 2 diabetes mellitus with unspecified complications: Secondary | ICD-10-CM

## 2023-05-30 NOTE — Telephone Encounter (Signed)
 Please call patient and clarify.  Will her insurance only pay for the Ozempic  and that is why we need to switch back?  Because if so then we will have to reinitiate a prior authorization process.

## 2023-05-31 ENCOUNTER — Other Ambulatory Visit (HOSPITAL_COMMUNITY): Payer: Self-pay

## 2023-05-31 ENCOUNTER — Telehealth: Payer: Self-pay

## 2023-05-31 MED ORDER — MOUNJARO 12.5 MG/0.5ML ~~LOC~~ SOAJ
12.5000 mg | SUBCUTANEOUS | 0 refills | Status: DC
  Filled 2023-05-31: qty 2, 28d supply, fill #0
  Filled 2023-06-24: qty 2, 28d supply, fill #1
  Filled 2023-07-27 (×2): qty 2, 28d supply, fill #2

## 2023-05-31 NOTE — Telephone Encounter (Signed)
 Pharmacy Patient Advocate Encounter   Received notification from CoverMyMeds that prior authorization for Mounjaro  12.5MG /0.5ML auto-injectors is required/requested.   Insurance verification completed.   The patient is insured through [employer] [facility] .   Per test claim: PA required and submitted KEY/EOC/Request #:  [id number] from 05/31/23 to 05/30/26

## 2023-06-06 ENCOUNTER — Ambulatory Visit: Payer: 59 | Admitting: Family Medicine

## 2023-06-06 ENCOUNTER — Encounter: Payer: Self-pay | Admitting: Family Medicine

## 2023-06-06 VITALS — BP 135/88 | HR 73 | Ht 64.0 in | Wt 188.4 lb

## 2023-06-06 DIAGNOSIS — F419 Anxiety disorder, unspecified: Secondary | ICD-10-CM

## 2023-06-06 DIAGNOSIS — E118 Type 2 diabetes mellitus with unspecified complications: Secondary | ICD-10-CM | POA: Diagnosis not present

## 2023-06-06 DIAGNOSIS — F32A Depression, unspecified: Secondary | ICD-10-CM

## 2023-06-06 LAB — POCT GLYCOSYLATED HEMOGLOBIN (HGB A1C): Hemoglobin A1C: 5.5 % (ref 4.0–5.6)

## 2023-06-06 NOTE — Assessment & Plan Note (Signed)
 Feels like she is managing on her own right now and opted not to take the venlafaxine .  Some going to go ahead and remove that from her medication list.  Always consider starting later if needed.

## 2023-06-06 NOTE — Assessment & Plan Note (Signed)
 Far she is really done great and tolerated the switch from the Ozempic  to the Mounjaro .  Currently on 12.5 mg A1c looks absolutely phenomenal even better than last time and she is down 8 pounds now with BMI of 32 continue to work on healthy diet.  We did discuss incorporating in more regular exercise and trying to get back on track with that now that spring is here.

## 2023-06-06 NOTE — Progress Notes (Signed)
 Established Patient Office Visit  Subjective  Patient ID: Jamie Mccarthy, female    DOB: Nov 21, 1975  Age: 48 y.o. MRN: [id number]  Chief Complaint  Patient presents with   Diabetes    HPI  Doing well with the switch from Ozempic  to Mounjaro  she still gets a little bit of diarrhea about 2 days after the injection but otherwise feels like she is doing great on it she just wanted to make sure that she has refills.  She has a new job but she is able to work from home but does have to sit in front of screens most of the day.  That she is not currently taking the Effexor .  PHQ-9 score of 9 though she only circled 1 point for question #2 and 0 points for question #1.  Some mild depressive symptoms.  To have some questions about the full course of Diflucan .  We had done a multiple week regimen to try to prevent recurrence she did complete that now that her blood sugars are also under better control that should help as well.  Flowsheet Row Office Visit from 06/06/2023 in [facility] at [facility]  PHQ-9 Total Score 6         06/06/2023    9:01 AM 02/06/2023   10:12 AM 08/31/2022    2:33 PM 05/02/2022    9:10 AM  GAD 7 : Generalized Anxiety Score  Nervous, Anxious, on Edge 0 1 1 2   Control/stop worrying 1 1 1 1   Worry too much - different things 1 1 1 2   Trouble relaxing 2 1 1 3   Restless 0 0 0 0  Easily annoyed or irritable 1 0 2 2  Afraid - awful might happen 0 0 0 0  Total GAD 7 Score 5 4 6 10   Anxiety Difficulty Not difficult at all Somewhat difficult Somewhat difficult Somewhat difficult         ROS    Objective:     BP 135/88   Pulse 73   Ht 5\' 4"  (1.626 m)   Wt 188 lb 6.4 oz (85.5 kg)   LMP 08/29/2015 (Approximate)   BMI 32.34 kg/m     Physical Exam Vitals and nursing note reviewed.  Constitutional:      Appearance: Normal appearance.  HENT:     Head: Normocephalic and atraumatic.  Eyes:     Conjunctiva/sclera:  Conjunctivae normal.  Cardiovascular:     Rate and Rhythm: Normal rate and regular rhythm.  Pulmonary:     Effort: Pulmonary effort is normal.     Breath sounds: Normal breath sounds.  Skin:    General: Skin is warm and dry.  Neurological:     Mental Status: She is alert.  Psychiatric:        Mood and Affect: Mood normal.      Results for orders placed or performed in visit on 06/06/23  POCT HgB A1C  Result Value Ref Range   Hemoglobin A1C 5.5 4.0 - 5.6 %   HbA1c POC (<> result, manual entry)     HbA1c, POC (prediabetic range)     HbA1c, POC (controlled diabetic range)         The 10-year ASCVD risk score (Arnett DK, et al., 2019) is: 3.1%    Assessment & Plan:   Problem List Items Addressed This Visit       Endocrine   Controlled type 2 diabetes mellitus with complication, without long-term current  use of insulin  (HCC) - Primary   Far she is really done great and tolerated the switch from the Ozempic  to the Mounjaro .  Currently on 12.5 mg A1c looks absolutely phenomenal even better than last time and she is down 8 pounds now with BMI of 32 continue to work on healthy diet.  We did discuss incorporating in more regular exercise and trying to get back on track with that now that spring is here.      Relevant Orders   POCT HgB A1C (Completed)   Urine Microalbumin w/creat. ratio     Other   Anxiety and depression   Feels like she is managing on her own right now and opted not to take the venlafaxine .  Some going to go ahead and remove that from her medication list.  Always consider starting later if needed.       Return in about 4 months (around 10/07/2023) for Diabetes follow-up.    Duaine German, MD

## 2023-06-07 ENCOUNTER — Ambulatory Visit: Payer: Self-pay | Admitting: Family Medicine

## 2023-06-07 LAB — MICROALBUMIN / CREATININE URINE RATIO
Creatinine, Urine: 152.7 mg/dL
Microalb/Creat Ratio: 3 mg/g{creat} (ref 0–29)
Microalbumin, Urine: 4.8 ug/mL

## 2023-06-07 LAB — SPECIMEN STATUS REPORT

## 2023-06-07 NOTE — Progress Notes (Signed)
No excess protein in the urine which is great.

## 2023-06-29 ENCOUNTER — Other Ambulatory Visit (HOSPITAL_COMMUNITY): Payer: Self-pay

## 2023-07-11 ENCOUNTER — Other Ambulatory Visit: Payer: Self-pay | Admitting: Family Medicine

## 2023-07-11 DIAGNOSIS — J301 Allergic rhinitis due to pollen: Secondary | ICD-10-CM

## 2023-07-27 ENCOUNTER — Other Ambulatory Visit (HOSPITAL_COMMUNITY): Payer: Self-pay

## 2023-08-16 ENCOUNTER — Other Ambulatory Visit: Payer: Self-pay | Admitting: Family Medicine

## 2023-08-16 ENCOUNTER — Ambulatory Visit: Payer: Self-pay

## 2023-08-16 DIAGNOSIS — E118 Type 2 diabetes mellitus with unspecified complications: Secondary | ICD-10-CM

## 2023-08-16 NOTE — Telephone Encounter (Signed)
 FYI Only or Action Required?: FYI only for provider.  Patient was last seen in primary care on 06/06/2023 by Alvan Dorothyann BIRCH, MD.  Called Nurse Triage reporting Dizziness.  Symptoms began several weeks ago.  Interventions attempted: Rest, hydration, or home remedies.  Symptoms are: unchanged.  Triage Disposition: Go to ED Now (Notify PCP), Go to ED Now (or PCP Triage)  Patient/caregiver understands and will follow disposition?: Yes  Copied from CRM [id number]. Topic: Clinical - Red Word Triage >> Aug 16, 2023  9:02 AM Jamie Mccarthy wrote: [facility] that prompted transfer to Nurse Triage: Patient is having dizzy spells, she is having some heart pressure, she is having a major headache. She is felling fatigued as well, she is way more tired now than she normally is. This has been going on for a few weeks and yesterday was a really bad day. Reason for Disposition  Extra heartbeats, irregular heart beating, or heart is beating very fast  (i.e., palpitations)  Feeling weak or lightheaded (e.g., woozy, feeling like they might faint)  Answer Assessment - Initial Assessment Questions 1. DESCRIPTION: Describe your dizziness.     Patient reports that she feels like she is going to pass out but catches herself.  2. LIGHTHEADED: Do you feel lightheaded? (e.g., somewhat faint, woozy, weak upon standing)     yes 3. VERTIGO: Do you feel like either you or the room is spinning or tilting? (i.e., vertigo)     no 4. SEVERITY: How bad is it?  Do you feel like you are going to faint? Can you stand and walk?     Patient reports intensity differences with her dizziness. Mild to moderate 5. ONSET:  When did the dizziness begin?     Started a couple of weeks ago.  6. AGGRAVATING FACTORS: Does anything make it worse? (e.g., standing, change in head position)     Moving from one position to another too quickly. Patient states she feels it worse when she bend down.  7. HEART RATE: Can you  tell me your heart rate? How many beats in 15 seconds?  (Note: Not all patients can do this.)       68 8. CAUSE: What do you think is causing the dizziness? (e.g., decreased fluids or food, diarrhea, emotional distress, heat exposure, new medicine, sudden standing, vomiting; unknown)     Patient reports dizziness is the worse with bending down 9. RECURRENT SYMPTOM: Have you had dizziness before? If Yes, ask: When was the last time? What happened that time?     Yes-patient reports syncopal episode last year 10. OTHER SYMPTOMS: Do you have any other symptoms? (e.g., fever, chest pain, vomiting, diarrhea, bleeding)       Palpitations, fatigue  Answer Assessment - Initial Assessment Questions 1. DESCRIPTION: Please describe your heart rate or heartbeat that you are having (e.g., fast/slow, regular/irregular, skipped or extra beats, palpitations)     palpitations 2. ONSET: When did it start? (e.g., minutes, hours, days)      A couple of weeks  3. DURATION: How long does it last (e.g., seconds, minutes, hours)     Patient unable to say how long they will last 4. PATTERN Does it come and go, or has it been constant since it started?  Does it get worse with exertion?   Are you feeling it now?     Comes and goes 6. HEART RATE: Can you tell me your heart rate? How many beats in 15 seconds?  Note: Not  all patients can do this.       68 7. RECURRENT SYMPTOM: Have you ever had this before? If Yes, ask: When was the last time? and What happened that time?      Been going on for a couple of weeks 8. CAUSE: What do you think is causing the palpitations?     unsure 9. CARDIAC HISTORY: Do you have any history of heart disease? (e.g., heart attack, angina, bypass surgery, angioplasty, arrhythmia)      no 10. OTHER SYMPTOMS: Do you have any other symptoms? (e.g., dizziness, chest pain, sweating, difficulty breathing)       Dizziness, fatigue  With symptoms of  dizziness, fatigue and palpitations, patient is recommended to the Emergency Department for evaluation.  Protocols used: Dizziness - Lightheadedness-A-AH, Heart Rate and Heartbeat Questions-A-AH

## 2023-08-19 ENCOUNTER — Other Ambulatory Visit (HOSPITAL_COMMUNITY): Payer: Self-pay

## 2023-08-19 MED ORDER — MOUNJARO 12.5 MG/0.5ML ~~LOC~~ SOAJ
12.5000 mg | SUBCUTANEOUS | 0 refills | Status: DC
  Filled 2023-08-19: qty 2, 28d supply, fill #0

## 2023-09-03 ENCOUNTER — Ambulatory Visit: Admitting: Family Medicine

## 2023-09-03 ENCOUNTER — Encounter: Payer: Self-pay | Admitting: Family Medicine

## 2023-09-03 VITALS — BP 119/84 | HR 78 | Ht 64.0 in | Wt 173.9 lb

## 2023-09-03 DIAGNOSIS — G43709 Chronic migraine without aura, not intractable, without status migrainosus: Secondary | ICD-10-CM | POA: Diagnosis not present

## 2023-09-03 DIAGNOSIS — F419 Anxiety disorder, unspecified: Secondary | ICD-10-CM | POA: Diagnosis not present

## 2023-09-03 DIAGNOSIS — J301 Allergic rhinitis due to pollen: Secondary | ICD-10-CM

## 2023-09-03 DIAGNOSIS — F32A Depression, unspecified: Secondary | ICD-10-CM | POA: Diagnosis not present

## 2023-09-03 DIAGNOSIS — G479 Sleep disorder, unspecified: Secondary | ICD-10-CM

## 2023-09-03 DIAGNOSIS — E118 Type 2 diabetes mellitus with unspecified complications: Secondary | ICD-10-CM | POA: Diagnosis not present

## 2023-09-03 LAB — POCT GLYCOSYLATED HEMOGLOBIN (HGB A1C): Hemoglobin A1C: 5.3 % (ref 4.0–5.6)

## 2023-09-03 MED ORDER — MOUNJARO 12.5 MG/0.5ML ~~LOC~~ SOAJ: 12.5000 mg | SUBCUTANEOUS | 1 refills | Status: DC

## 2023-09-03 MED ORDER — LEVOCETIRIZINE DIHYDROCHLORIDE 5 MG PO TABS: 5.0000 mg | ORAL_TABLET | Freq: Every evening | ORAL | 3 refills | Status: AC

## 2023-09-03 MED ORDER — RIZATRIPTAN BENZOATE 10 MG PO TBDP: 10.0000 mg | ORAL_TABLET | ORAL | 6 refills | Status: AC | PRN

## 2023-09-03 NOTE — Progress Notes (Signed)
 Established Patient Office Visit  Subjective  Patient ID: Jamie Mccarthy, female    DOB: Apr 01, 1975  Age: 48 y.o. MRN: [id number]  Chief Complaint  Patient presents with   Diabetes    HPI F/U migraines and DM   In regards to her migraine she actually discontinued her Ajovy  after she got a middle ear piercing she wanted to see if it helped.  She says she is actually doing great and has not had a migraine since May.  She does not want to discontinue the Ajovy  completely in case she needs to restart it this fall.  Diabetes-we switched from Ozempic  to Mounjaro  in January.  She has been doing absolutely fantastic with it.  BMI is now down to 29.  She has not had any concerning side effects.  Still struggling some with sleep quality.  Usually she can fall asleep sometimes it takes a little longer but it is normally around 2 AM that she wakes up and then cannot fall back asleep until about 4 AM.  She tries to avoid any kind of phone or screen during that time it happens almost every night.  And then she feels really tired during the day because she has not slept well.  She said she bought some melatonin but has not tried it yet.    ROS    Objective:     BP 119/84   Pulse 78   Ht 5' 4 (1.626 m)   Wt 173 lb 14.4 oz (78.9 kg)   LMP 08/29/2015 (Approximate)   BMI 29.85 kg/m    Physical Exam Vitals and nursing note reviewed.  Constitutional:      Appearance: Normal appearance.  HENT:     Head: Normocephalic and atraumatic.  Eyes:     Conjunctiva/sclera: Conjunctivae normal.  Cardiovascular:     Rate and Rhythm: Normal rate and regular rhythm.  Pulmonary:     Effort: Pulmonary effort is normal.     Breath sounds: Normal breath sounds.  Skin:    General: Skin is warm and dry.  Neurological:     Mental Status: She is alert.  Psychiatric:        Mood and Affect: Mood normal.      Results for orders placed or performed in visit on 09/03/23  POCT HgB A1C  Result Value  Ref Range   Hemoglobin A1C 5.3 4.0 - 5.6 %   HbA1c POC (<> result, manual entry)     HbA1c, POC (prediabetic range)     HbA1c, POC (controlled diabetic range)        The 10-year ASCVD risk score (Arnett DK, et al., 2019) is: 1.8%    Assessment & Plan:   Problem List Items Addressed This Visit       Cardiovascular and Mediastinum   Chronic migraine w/o aura w/o status migrainosus, not intractable   Will keep Ajovy  on the med list for now in case she needs to restart it this fall but she is not actively on it currently.  She feels like she is doing well after receiving a middle ear piercing.  Did refill Maxalt  for as needed use.      Relevant Medications   rizatriptan  (MAXALT -MLT) 10 MG disintegrating tablet     Respiratory   Allergic rhinitis   Relevant Medications   levocetirizine (XYZAL ) 5 MG tablet     Endocrine   Controlled type 2 diabetes mellitus with complication, without long-term current use of insulin  (HCC) - Primary  A1c looks fantastic at 5.3.  Just encouraged her to continue to work on regular exercise.  Continue current regimen with the Mounjaro .      Relevant Medications   tirzepatide  (MOUNJARO ) 12.5 MG/0.5ML Pen   Other Relevant Orders   POCT HgB A1C (Completed)     Other   Sleep disturbance   Okay to start trial of melatonin start with a lower dose closer to 3 mg to take before bedtime and see if this is helpful for sleep maintenance overnight.  Continue to work on scheduled bedtime and a wake time and avoiding any kind of stimulant such as caffeine  etc.      Anxiety and depression   He feels like her anxiety has been ramped up a little bit more but she feels like some of that is also related to not sleeping well.  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       Return in about 6 months (around 03/05/2024) for Diabetes follow-up.    Dorothyann Byars, MD

## 2023-09-03 NOTE — Assessment & Plan Note (Signed)
 He feels like her anxiety has been ramped up a little bit more but she feels like some of that is also related to not sleeping well.  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

## 2023-09-03 NOTE — Assessment & Plan Note (Signed)
 A1c looks fantastic at 5.3.  Just encouraged her to continue to work on regular exercise.  Continue current regimen with the Mounjaro .

## 2023-09-03 NOTE — Assessment & Plan Note (Signed)
 Okay to start trial of melatonin start with a lower dose closer to 3 mg to take before bedtime and see if this is helpful for sleep maintenance overnight.  Continue to work on scheduled bedtime and a wake time and avoiding any kind of stimulant such as caffeine  etc.

## 2023-09-03 NOTE — Assessment & Plan Note (Signed)
 Will keep Ajovy  on the med list for now in case she needs to restart it this fall but she is not actively on it currently.  She feels like she is doing well after receiving a middle ear piercing.  Did refill Maxalt  for as needed use.

## 2023-09-04 ENCOUNTER — Encounter: Payer: Self-pay | Admitting: Family Medicine

## 2023-09-04 ENCOUNTER — Other Ambulatory Visit (HOSPITAL_BASED_OUTPATIENT_CLINIC_OR_DEPARTMENT_OTHER): Payer: Self-pay

## 2023-09-04 ENCOUNTER — Other Ambulatory Visit (HOSPITAL_COMMUNITY): Payer: Self-pay

## 2023-09-04 DIAGNOSIS — E118 Type 2 diabetes mellitus with unspecified complications: Secondary | ICD-10-CM

## 2023-09-04 MED ORDER — MOUNJARO 12.5 MG/0.5ML ~~LOC~~ SOAJ
12.5000 mg | SUBCUTANEOUS | 1 refills | Status: AC
  Filled 2023-09-04 – 2023-09-21 (×3): qty 2, 28d supply, fill #0
  Filled 2023-10-13: qty 2, 28d supply, fill #1
  Filled 2023-11-11: qty 2, 28d supply, fill #2
  Filled 2023-12-09: qty 2, 28d supply, fill #3
  Filled 2024-01-09: qty 2, 28d supply, fill #4
  Filled 2024-02-03: qty 2, 28d supply, fill #5
  Filled ????-??-?? (×2): fill #0

## 2023-09-21 ENCOUNTER — Other Ambulatory Visit (HOSPITAL_COMMUNITY): Payer: Self-pay

## 2023-09-24 ENCOUNTER — Encounter: Payer: Self-pay | Admitting: Sports Medicine

## 2023-10-03 ENCOUNTER — Other Ambulatory Visit (HOSPITAL_COMMUNITY): Payer: Self-pay

## 2023-10-03 LAB — HM MAMMOGRAPHY

## 2023-10-03 MED ORDER — ESTRADIOL 0.05 MG/24HR TD PTTW
MEDICATED_PATCH | TRANSDERMAL | 4 refills | Status: AC
  Filled 2023-10-03: qty 8, 30d supply, fill #0

## 2023-10-05 ENCOUNTER — Other Ambulatory Visit (HOSPITAL_COMMUNITY): Payer: Self-pay

## 2023-10-07 ENCOUNTER — Other Ambulatory Visit (HOSPITAL_COMMUNITY): Payer: Self-pay

## 2023-10-07 ENCOUNTER — Ambulatory Visit: Admitting: Family Medicine

## 2023-11-04 ENCOUNTER — Encounter: Payer: Self-pay | Admitting: Family Medicine

## 2023-12-03 ENCOUNTER — Telehealth: Payer: Self-pay

## 2023-12-03 ENCOUNTER — Encounter: Payer: Self-pay | Admitting: Family Medicine

## 2023-12-03 NOTE — Telephone Encounter (Signed)
 Jamie Mccarthy states she received a colonoscopy on 12/28/22 at [place]. I called for records.   She states she received a mammogram at [facility] by Cherylann Solo, DO. I sent for records.   She states she still has to do the diabetic eye exam.

## 2024-03-05 ENCOUNTER — Ambulatory Visit: Admitting: Family Medicine
